# Patient Record
Sex: Male | Born: 1945
Health system: Southern US, Community
[De-identification: ages and names within clinical notes are randomized; demographics above are authoritative.]

## PROBLEM LIST (undated history)

## (undated) DIAGNOSIS — Z9289 Personal history of other medical treatment: Secondary | ICD-10-CM

## (undated) DIAGNOSIS — R7881 Bacteremia: Secondary | ICD-10-CM

## (undated) DIAGNOSIS — I4819 Other persistent atrial fibrillation: Secondary | ICD-10-CM

## (undated) DIAGNOSIS — I82403 Acute embolism and thrombosis of unspecified deep veins of lower extremity, bilateral: Secondary | ICD-10-CM

## (undated) DIAGNOSIS — G4733 Obstructive sleep apnea (adult) (pediatric): Secondary | ICD-10-CM

## (undated) DIAGNOSIS — Z9989 Dependence on other enabling machines and devices: Secondary | ICD-10-CM

## (undated) DIAGNOSIS — E669 Obesity, unspecified: Secondary | ICD-10-CM

## (undated) DIAGNOSIS — R6520 Severe sepsis without septic shock: Secondary | ICD-10-CM

## (undated) DIAGNOSIS — I10 Essential (primary) hypertension: Secondary | ICD-10-CM

## (undated) DIAGNOSIS — G4730 Sleep apnea, unspecified: Secondary | ICD-10-CM

## (undated) DIAGNOSIS — M311 Thrombotic microangiopathy: Secondary | ICD-10-CM

## (undated) DIAGNOSIS — A419 Sepsis, unspecified organism: Secondary | ICD-10-CM

## (undated) DIAGNOSIS — E785 Hyperlipidemia, unspecified: Secondary | ICD-10-CM

## (undated) HISTORY — PX: SKIN SURGERY: SHX2413

## (undated) HISTORY — DX: Bacteremia: R78.81

## (undated) HISTORY — DX: Obstructive sleep apnea (adult) (pediatric): G47.33

## (undated) HISTORY — DX: Personal history of other medical treatment: Z92.89

## (undated) HISTORY — DX: Thrombotic microangiopathy: M31.1

## (undated) HISTORY — DX: Dependence on other enabling machines and devices: Z99.89

## (undated) HISTORY — DX: Acute embolism and thrombosis of unspecified deep veins of lower extremity, bilateral: I82.403

## (undated) HISTORY — PX: JOINT REPLACEMENT: SHX530

## (undated) HISTORY — DX: Sepsis, unspecified organism: A41.9

## (undated) HISTORY — PX: TOTAL KNEE ARTHROPLASTY: SHX125

## (undated) HISTORY — PX: EYE SURGERY: SHX253

## (undated) HISTORY — DX: Sleep apnea, unspecified: G47.30

## (undated) HISTORY — DX: Severe sepsis without septic shock: R65.20

---

## 2003-10-11 ENCOUNTER — Ambulatory Visit (HOSPITAL_COMMUNITY): Admission: RE | Admit: 2003-10-11 | Discharge: 2003-10-11 | Payer: Self-pay | Admitting: Gastroenterology

## 2008-08-23 ENCOUNTER — Ambulatory Visit: Payer: Self-pay | Admitting: Orthopedic Surgery

## 2008-09-09 ENCOUNTER — Inpatient Hospital Stay: Payer: Self-pay | Admitting: Orthopedic Surgery

## 2009-03-04 ENCOUNTER — Ambulatory Visit: Payer: Self-pay | Admitting: Orthopedic Surgery

## 2009-03-17 ENCOUNTER — Inpatient Hospital Stay: Payer: Self-pay | Admitting: Orthopedic Surgery

## 2011-02-01 DIAGNOSIS — I10 Essential (primary) hypertension: Secondary | ICD-10-CM | POA: Diagnosis not present

## 2011-02-01 DIAGNOSIS — I4891 Unspecified atrial fibrillation: Secondary | ICD-10-CM | POA: Diagnosis not present

## 2011-02-07 DIAGNOSIS — I10 Essential (primary) hypertension: Secondary | ICD-10-CM | POA: Diagnosis not present

## 2011-02-07 DIAGNOSIS — Z9289 Personal history of other medical treatment: Secondary | ICD-10-CM

## 2011-02-07 DIAGNOSIS — R0989 Other specified symptoms and signs involving the circulatory and respiratory systems: Secondary | ICD-10-CM | POA: Diagnosis not present

## 2011-02-07 DIAGNOSIS — R0609 Other forms of dyspnea: Secondary | ICD-10-CM | POA: Diagnosis not present

## 2011-02-07 DIAGNOSIS — I4891 Unspecified atrial fibrillation: Secondary | ICD-10-CM | POA: Diagnosis not present

## 2011-02-07 HISTORY — PX: TRANSTHORACIC ECHOCARDIOGRAM: SHX275

## 2011-02-07 HISTORY — DX: Personal history of other medical treatment: Z92.89

## 2011-02-07 HISTORY — PX: NM MYOVIEW LTD: HXRAD82

## 2011-02-09 DIAGNOSIS — I4821 Permanent atrial fibrillation: Secondary | ICD-10-CM

## 2011-02-09 DIAGNOSIS — I4819 Other persistent atrial fibrillation: Secondary | ICD-10-CM

## 2011-02-09 HISTORY — DX: Permanent atrial fibrillation: I48.21

## 2011-02-09 HISTORY — DX: Other persistent atrial fibrillation: I48.19

## 2011-02-21 DIAGNOSIS — I4891 Unspecified atrial fibrillation: Secondary | ICD-10-CM | POA: Diagnosis not present

## 2011-02-21 DIAGNOSIS — R9430 Abnormal result of cardiovascular function study, unspecified: Secondary | ICD-10-CM | POA: Diagnosis not present

## 2011-02-21 DIAGNOSIS — I10 Essential (primary) hypertension: Secondary | ICD-10-CM | POA: Diagnosis not present

## 2011-03-07 DIAGNOSIS — R7301 Impaired fasting glucose: Secondary | ICD-10-CM | POA: Diagnosis not present

## 2011-03-07 DIAGNOSIS — I4891 Unspecified atrial fibrillation: Secondary | ICD-10-CM | POA: Diagnosis not present

## 2011-03-07 DIAGNOSIS — Z79899 Other long term (current) drug therapy: Secondary | ICD-10-CM | POA: Diagnosis not present

## 2011-05-30 DIAGNOSIS — I10 Essential (primary) hypertension: Secondary | ICD-10-CM | POA: Diagnosis not present

## 2011-05-30 DIAGNOSIS — I4891 Unspecified atrial fibrillation: Secondary | ICD-10-CM | POA: Diagnosis not present

## 2011-05-31 ENCOUNTER — Encounter (HOSPITAL_COMMUNITY): Payer: Self-pay | Admitting: Pharmacy Technician

## 2011-06-01 DIAGNOSIS — Z01818 Encounter for other preprocedural examination: Secondary | ICD-10-CM | POA: Diagnosis not present

## 2011-06-01 DIAGNOSIS — R5381 Other malaise: Secondary | ICD-10-CM | POA: Diagnosis not present

## 2011-06-01 DIAGNOSIS — D689 Coagulation defect, unspecified: Secondary | ICD-10-CM | POA: Diagnosis not present

## 2011-06-05 ENCOUNTER — Other Ambulatory Visit: Payer: Self-pay | Admitting: Cardiology

## 2011-06-06 ENCOUNTER — Encounter (HOSPITAL_COMMUNITY): Payer: Self-pay | Admitting: Cardiology

## 2011-06-07 MED ORDER — SODIUM CHLORIDE 0.9 % IV SOLN: INTRAVENOUS | Status: DC

## 2011-06-08 ENCOUNTER — Encounter (HOSPITAL_COMMUNITY): Payer: Self-pay | Admitting: Anesthesiology

## 2011-06-08 ENCOUNTER — Encounter (HOSPITAL_COMMUNITY): Payer: Self-pay | Admitting: *Deleted

## 2011-06-08 ENCOUNTER — Ambulatory Visit (HOSPITAL_COMMUNITY): Payer: Medicare Other | Admitting: Anesthesiology

## 2011-06-08 ENCOUNTER — Encounter (HOSPITAL_COMMUNITY)
Admission: RE | Disposition: A | Discharge status: Home / Self Care | Payer: Self-pay | Source: Ambulatory Visit | Attending: Cardiology

## 2011-06-08 ENCOUNTER — Ambulatory Visit (HOSPITAL_COMMUNITY)
Admission: RE | Admit: 2011-06-08 | Discharge: 2011-06-08 | Disposition: A | Discharge status: Home / Self Care | Payer: Medicare Other | Source: Ambulatory Visit | Attending: Cardiology | Admitting: Cardiology

## 2011-06-08 DIAGNOSIS — Z6830 Body mass index (BMI) 30.0-30.9, adult: Secondary | ICD-10-CM | POA: Insufficient documentation

## 2011-06-08 DIAGNOSIS — E669 Obesity, unspecified: Secondary | ICD-10-CM | POA: Diagnosis not present

## 2011-06-08 DIAGNOSIS — E785 Hyperlipidemia, unspecified: Secondary | ICD-10-CM | POA: Diagnosis not present

## 2011-06-08 DIAGNOSIS — Z9660 Presence of unspecified orthopedic joint implant: Secondary | ICD-10-CM | POA: Insufficient documentation

## 2011-06-08 DIAGNOSIS — I4891 Unspecified atrial fibrillation: Secondary | ICD-10-CM | POA: Diagnosis not present

## 2011-06-08 DIAGNOSIS — G4730 Sleep apnea, unspecified: Secondary | ICD-10-CM | POA: Insufficient documentation

## 2011-06-08 DIAGNOSIS — I10 Essential (primary) hypertension: Secondary | ICD-10-CM | POA: Diagnosis not present

## 2011-06-08 HISTORY — PX: CARDIOVERSION: SHX1299

## 2011-06-08 HISTORY — DX: Hyperlipidemia, unspecified: E78.5

## 2011-06-08 HISTORY — DX: Essential (primary) hypertension: I10

## 2011-06-08 HISTORY — DX: Other persistent atrial fibrillation: I48.19

## 2011-06-08 HISTORY — DX: Obesity, unspecified: E66.9

## 2011-06-08 SURGERY — CARDIOVERSION
Anesthesia: Monitor Anesthesia Care | Wound class: Clean

## 2011-06-08 MED ORDER — SODIUM CHLORIDE 0.9 % IV SOLN
INTRAVENOUS | Status: DC | PRN
  Administered 2011-06-08: 12:00:00 via INTRAVENOUS

## 2011-06-08 MED ORDER — PROPOFOL 10 MG/ML IV BOLUS
INTRAVENOUS | Status: DC | PRN
  Administered 2011-06-08: 100 mg via INTRAVENOUS

## 2011-06-08 NOTE — Anesthesia Preprocedure Evaluation (Addendum)
Anesthesia Evaluation  Patient identified by MRN, date of birth, ID band Patient awake    Reviewed: Allergy & Precautions, H&P , NPO status , Patient's Chart, lab work & pertinent test results, reviewed documented beta blocker date and time   History of Anesthesia Complications Negative for: history of anesthetic complications  Airway Mallampati: III TM Distance: >3 FB Neck ROM: Full    Dental  (+) Teeth Intact and Dental Advisory Given   Pulmonary sleep apnea and Continuous Positive Airway Pressure Ventilation ,  breath sounds clear to auscultation        Cardiovascular hypertension, Pt. on medications and Pt. on home beta blockers + dysrhythmias Atrial Fibrillation Rhythm:Irregular     Neuro/Psych    GI/Hepatic   Endo/Other    Renal/GU      Musculoskeletal   Abdominal   Peds  Hematology   Anesthesia Other Findings   Reproductive/Obstetrics                           Anesthesia Physical Anesthesia Plan  ASA: III  Anesthesia Plan: General   Post-op Pain Management:    Induction:   Airway Management Planned: Mask  Additional Equipment:   Intra-op Plan:   Post-operative Plan: Extubation in OR  Informed Consent: I have reviewed the patients History and Physical, chart, labs and discussed the procedure including the risks, benefits and alternatives for the proposed anesthesia with the patient or authorized representative who has indicated his/her understanding and acceptance.   Dental advisory given  Plan Discussed with: CRNA, Surgeon and Anesthesiologist  Anesthesia Plan Comments:         Anesthesia Quick Evaluation

## 2011-06-08 NOTE — CV Procedure (Signed)
[facility]  CARDIOVERSION NOTE   Procedure: Electrical Cardioversion Indications:  Atrial Fibrillation  Procedure Details:  Consent: Risks of procedure as well as the alternatives and risks of each were explained to the (patient/caregiver).  Consent for procedure obtained.  Time Out: Verified patient identification, verified procedure, site/side was marked, verified correct patient position, special equipment/implants available, medications/allergies/relevent history reviewed, required imaging and test results available.  Performed  Patient placed on cardiac monitor, pulse oximetry, supplemental oxygen as necessary.  Sedation given: Diprovan 100 mg Pacer pads placed anterior and posterior chest.  Cardioverted 1 time(s).  Cardioverted at 120J.  Evaluation: Findings: Post procedure EKG shows: NSR Complications: None Patient did tolerate procedure well.   Lennette Bihari, MD, [facility] 06/08/2011 12:46 PM

## 2011-06-08 NOTE — Discharge Instructions (Signed)
Electrical Cardioversion Cardioversion is the delivery of a jolt of electricity to change the rhythm of the heart. Sticky patches or metal paddles are placed on the chest to deliver the electricity from a special device. This is done to restore a normal rhythm. A rhythm that is too fast or not regular keeps the heart from pumping well. Compared to medicines used to change an abnormal rhythm, cardioversion is faster and works better. It is also unpleasant and may dislodge blood clots from the heart. WHEN WOULD THIS BE DONE?  In an emergency:   There is low or no blood pressure as a result of the heart rhythm.   Normal rhythm must be restored as fast as possible to protect the brain and heart from further damage.   It may save a life.   For less serious heart rhythms, such as atrial fibrillation or flutter, in which:   The heart is beating too fast or is not regular.   The heart is still able to pump enough blood, but not as well as it should.   Medicine to change the rhythm has not worked.   It is safe to wait in order to allow time for preparation.  LET YOUR CAREGIVER KNOW ABOUT:   Every medicine you are taking. It is very important to do this! Know when to take or stop taking any of them.   Any time in the past that you have felt your heart was not beating normally.  RISKS AND COMPLICATIONS   Clots may form in the chambers of the heart if it is beating too fast. These clots may be dislodged during the procedure and travel to other parts of the body.   There is risk of a stroke during and after the procedure if a clot moves. Blood thinners lower this risk.   You may have a special test of your heart (TEE) to make sure there are no clots in your heart.  BEFORE THE PROCEDURE   You may have some tests to see how well your heart is working.   You may start taking blood thinners so your blood does not clot as easily.   Other drugs may be given to help your heart work better.   PROCEDURE (SCHEDULED)  The procedure is typically done in a hospital by a heart doctor (cardiologist).   You will be told when and where to go.   You may be given some medicine through an intravenous (IV) access to reduce discomfort and make you sleepy before the procedure.   Your whole body may move when the shock is delivered. Your chest may feel sore.   You may be able to go home after a few hours. Your heart rhythm will be watched to make sure it does not change.  HOME CARE INSTRUCTIONS   Only take medicine as directed by your caregiver. Be sure you understand how and when to take your medicine.   Learn how to feel your pulse and check it often.   Limit your activity for 48 hours.   Avoid caffeine and other stimulants as directed.  SEEK MEDICAL CARE IF:   You feel like your heart is beating too fast or your pulse is not regular.   You have any questions about your medicines.   You have bleeding that will not stop.  SEEK IMMEDIATE MEDICAL CARE IF:   You are dizzy or feel faint.   It is hard to breathe or you feel short of breath.     There is a change in discomfort in your chest.   Your speech is slurred or you have trouble moving your arm or leg on one side.   You get a muscle cramp.   Your fingers or toes turn cold or blue.  MAKE SURE YOU:   Understand these instructions.   Will watch your condition.   Will get help right away if you are not doing well or get worse.  Document Released: 12/15/2001 Document Revised: 12/14/2010 Document Reviewed: 04/16/2007 Exit[employer] Patient Information 2012 [employer]. 

## 2011-06-08 NOTE — Anesthesia Postprocedure Evaluation (Signed)
  Anesthesia Post-op Note  Patient: Brett [name]  Procedure(s) Performed: Procedure(s) (LRB): CARDIOVERSION (N/A)  Patient Location: PACU and Short Stay  Anesthesia Type: MAC  Level of Consciousness: awake, alert  and oriented  Airway and Oxygen Therapy: Patient Spontanous Breathing and Patient connected to face mask oxygen  Post-op Pain: none  Post-op Assessment: Post-op Vital signs reviewed, Patient's Cardiovascular Status Stable, Respiratory Function Stable, Patent Airway, No signs of Nausea or vomiting and Pain level controlled  Post-op Vital Signs: Reviewed and stable  Complications: No apparent anesthesia complications

## 2011-06-08 NOTE — Preoperative (Signed)
Beta Blockers   Reason not to administer Beta Blockers:Not Applicable 

## 2011-06-08 NOTE — Transfer of Care (Signed)
Immediate Anesthesia Transfer of Care Note  Patient: Brett [name]  Procedure(s) Performed: Procedure(s) (LRB): CARDIOVERSION (N/A)  Patient Location: PACU and Short Stay  Anesthesia Type: MAC  Level of Consciousness: awake, alert  and oriented  Airway & Oxygen Therapy: Patient Spontanous Breathing and Patient connected to nasal cannula oxygen  Post-op Assessment: Report given to PACU RN  Post vital signs: Reviewed and stable  Complications: No apparent anesthesia complications

## 2011-06-08 NOTE — H&P (Signed)
History and Physical Interval Note:  NAME:  Brett [name]   MRN: [id number] DOB:  [birth year]   ADMIT DATE: 06/08/2011   06/08/2011 2:26 PM  Brett [name] is a 66 y.o. male with PMH below, who has been adequately anticoagulated on Pradaxa, and is on Multaq for persistent Afib.  He is still somwhat SOB & fatigued. He is referred for [facility].   Past Medical History  Diagnosis Date  . Persistent atrial fibrillation 02/2011    Negative Myoview; Normal EF by Echo  . Obesity (BMI 30-39.9)   . Hypertension   . Dyslipidemia    Past Surgical History  Procedure Date  . Joint replacement     FAMHx: History reviewed. No pertinent family history.  SOCHx:  reports that he has quit smoking. His smoking use included Cigars. He does not have any smokeless tobacco history on file. He reports that he drinks alcohol. He reports that he does not use illicit drugs.  ALLERGIES: No Known Allergies  HOME MEDICATIONS: Prescriptions prior to admission  Medication Sig Dispense Refill  . aspirin EC 81 MG tablet Take 81 mg by mouth daily.      . dabigatran (PRADAXA) 150 MG CAPS Take 150 mg by mouth every 12 (twelve) hours.      . dronedarone (MULTAQ) 400 MG tablet Take 400 mg by mouth 2 (two) times daily with a meal.      . fexofenadine (ALLEGRA) 180 MG tablet Take 180 mg by mouth daily.      Marland Kitchen lisinopril-hydrochlorothiazide (PRINZIDE,ZESTORETIC) 20-25 MG per tablet Take 0.5 tablets by mouth daily.      . metoprolol succinate (TOPROL-XL) 100 MG 24 hr tablet Take 100 mg by mouth daily. Take with or immediately following a meal.      . Multiple Vitamin (MULITIVITAMIN WITH MINERALS) TABS Take 1 tablet by mouth daily.      . niacin (NIASPAN) 500 MG CR tablet Take 500 mg by mouth at bedtime.        PHYSICAL EXAM:Blood pressure 125/85, pulse 66, temperature 98.1 F (36.7 C), temperature source Oral, resp. rate 22, height 5\' 11"  (1.803 m), weight 131.543 kg (290 lb), SpO2 99.00%.   IMPRESSION &  PLAN The patients' history has been reviewed, patient examined, no change in status from most recent note, stable for surgery. I have reviewed the patients' chart and labs. Questions were answered to the patient's satisfaction.    Brett [name] has presented today for surgery, with the diagnosis of Persistent Atrial Fibrillation. The various methods of treatment have been discussed with the patient and family.    After consideration of risks, benefits and other options for treatment, the patient has consented to Procedure(s):  Elective Synchronized Direct Current Cardioversion.  as a surgical intervention.   We will proceed with the planned procedure.  The procedure will be performed by Dr. [name], due to my involvement in an ongoing Cardiac Cath/PCI.   Mariel Lukins W [facility] [address] [place], [place]  [address]  [phone]  06/08/2011 2:26 PM

## 2011-06-11 ENCOUNTER — Encounter (HOSPITAL_COMMUNITY): Payer: Self-pay | Admitting: Cardiology

## 2011-06-28 DIAGNOSIS — I2510 Atherosclerotic heart disease of native coronary artery without angina pectoris: Secondary | ICD-10-CM | POA: Diagnosis not present

## 2011-06-28 DIAGNOSIS — E782 Mixed hyperlipidemia: Secondary | ICD-10-CM | POA: Diagnosis not present

## 2011-06-28 DIAGNOSIS — I10 Essential (primary) hypertension: Secondary | ICD-10-CM | POA: Diagnosis not present

## 2011-06-28 DIAGNOSIS — E039 Hypothyroidism, unspecified: Secondary | ICD-10-CM | POA: Diagnosis not present

## 2011-06-28 DIAGNOSIS — I472 Ventricular tachycardia: Secondary | ICD-10-CM | POA: Diagnosis not present

## 2011-06-28 DIAGNOSIS — G4733 Obstructive sleep apnea (adult) (pediatric): Secondary | ICD-10-CM | POA: Diagnosis not present

## 2011-06-28 DIAGNOSIS — R7301 Impaired fasting glucose: Secondary | ICD-10-CM | POA: Diagnosis not present

## 2011-06-28 DIAGNOSIS — Z79899 Other long term (current) drug therapy: Secondary | ICD-10-CM | POA: Diagnosis not present

## 2011-06-28 DIAGNOSIS — I4891 Unspecified atrial fibrillation: Secondary | ICD-10-CM | POA: Diagnosis not present

## 2011-08-26 DIAGNOSIS — I872 Venous insufficiency (chronic) (peripheral): Secondary | ICD-10-CM | POA: Insufficient documentation

## 2011-09-06 DIAGNOSIS — I10 Essential (primary) hypertension: Secondary | ICD-10-CM | POA: Diagnosis not present

## 2011-09-06 DIAGNOSIS — I4891 Unspecified atrial fibrillation: Secondary | ICD-10-CM | POA: Diagnosis not present

## 2011-09-13 DIAGNOSIS — H2510 Age-related nuclear cataract, unspecified eye: Secondary | ICD-10-CM | POA: Diagnosis not present

## 2011-09-13 DIAGNOSIS — H43819 Vitreous degeneration, unspecified eye: Secondary | ICD-10-CM | POA: Diagnosis not present

## 2011-10-15 DIAGNOSIS — Z8249 Family history of ischemic heart disease and other diseases of the circulatory system: Secondary | ICD-10-CM | POA: Diagnosis not present

## 2011-10-15 DIAGNOSIS — I4891 Unspecified atrial fibrillation: Secondary | ICD-10-CM | POA: Diagnosis not present

## 2011-10-15 DIAGNOSIS — Z79899 Other long term (current) drug therapy: Secondary | ICD-10-CM | POA: Diagnosis not present

## 2011-10-15 DIAGNOSIS — I10 Essential (primary) hypertension: Secondary | ICD-10-CM | POA: Diagnosis not present

## 2011-10-15 DIAGNOSIS — E785 Hyperlipidemia, unspecified: Secondary | ICD-10-CM | POA: Diagnosis not present

## 2012-01-21 DIAGNOSIS — R635 Abnormal weight gain: Secondary | ICD-10-CM | POA: Diagnosis not present

## 2012-01-21 DIAGNOSIS — I10 Essential (primary) hypertension: Secondary | ICD-10-CM | POA: Diagnosis not present

## 2012-01-21 DIAGNOSIS — I4891 Unspecified atrial fibrillation: Secondary | ICD-10-CM | POA: Diagnosis not present

## 2012-01-21 DIAGNOSIS — E785 Hyperlipidemia, unspecified: Secondary | ICD-10-CM | POA: Diagnosis not present

## 2012-02-11 DIAGNOSIS — R609 Edema, unspecified: Secondary | ICD-10-CM | POA: Diagnosis not present

## 2012-02-11 DIAGNOSIS — I10 Essential (primary) hypertension: Secondary | ICD-10-CM | POA: Diagnosis not present

## 2012-02-11 DIAGNOSIS — I4891 Unspecified atrial fibrillation: Secondary | ICD-10-CM | POA: Diagnosis not present

## 2012-02-12 ENCOUNTER — Other Ambulatory Visit (HOSPITAL_COMMUNITY): Payer: Self-pay | Admitting: Cardiology

## 2012-02-12 DIAGNOSIS — I10 Essential (primary) hypertension: Secondary | ICD-10-CM

## 2012-02-12 DIAGNOSIS — I872 Venous insufficiency (chronic) (peripheral): Secondary | ICD-10-CM

## 2012-02-12 DIAGNOSIS — R609 Edema, unspecified: Secondary | ICD-10-CM

## 2012-03-05 ENCOUNTER — Ambulatory Visit (HOSPITAL_COMMUNITY)
Admission: RE | Admit: 2012-03-05 | Discharge: 2012-03-05 | Disposition: A | Discharge status: Home / Self Care | Payer: Medicare Other | Source: Ambulatory Visit | Attending: Cardiology | Admitting: Cardiology

## 2012-03-05 DIAGNOSIS — I10 Essential (primary) hypertension: Secondary | ICD-10-CM | POA: Diagnosis not present

## 2012-03-05 DIAGNOSIS — I872 Venous insufficiency (chronic) (peripheral): Secondary | ICD-10-CM

## 2012-03-05 DIAGNOSIS — R609 Edema, unspecified: Secondary | ICD-10-CM

## 2012-03-05 DIAGNOSIS — M7989 Other specified soft tissue disorders: Secondary | ICD-10-CM | POA: Diagnosis not present

## 2012-03-05 NOTE — Progress Notes (Signed)
Venous Duplex Completed.  Kadon Andrus D  

## 2012-04-03 DIAGNOSIS — G4730 Sleep apnea, unspecified: Secondary | ICD-10-CM | POA: Diagnosis not present

## 2012-04-03 DIAGNOSIS — J3089 Other allergic rhinitis: Secondary | ICD-10-CM | POA: Diagnosis not present

## 2012-04-03 DIAGNOSIS — R609 Edema, unspecified: Secondary | ICD-10-CM | POA: Diagnosis not present

## 2012-04-03 DIAGNOSIS — E039 Hypothyroidism, unspecified: Secondary | ICD-10-CM | POA: Diagnosis not present

## 2012-04-03 DIAGNOSIS — Z125 Encounter for screening for malignant neoplasm of prostate: Secondary | ICD-10-CM | POA: Diagnosis not present

## 2012-04-03 DIAGNOSIS — I10 Essential (primary) hypertension: Secondary | ICD-10-CM | POA: Diagnosis not present

## 2012-04-03 DIAGNOSIS — L02419 Cutaneous abscess of limb, unspecified: Secondary | ICD-10-CM | POA: Diagnosis not present

## 2012-04-03 DIAGNOSIS — R21 Rash and other nonspecific skin eruption: Secondary | ICD-10-CM | POA: Diagnosis not present

## 2012-04-03 DIAGNOSIS — Z79899 Other long term (current) drug therapy: Secondary | ICD-10-CM | POA: Diagnosis not present

## 2012-04-03 DIAGNOSIS — R7301 Impaired fasting glucose: Secondary | ICD-10-CM | POA: Diagnosis not present

## 2012-04-03 DIAGNOSIS — E559 Vitamin D deficiency, unspecified: Secondary | ICD-10-CM | POA: Diagnosis not present

## 2012-05-20 DIAGNOSIS — I8310 Varicose veins of unspecified lower extremity with inflammation: Secondary | ICD-10-CM | POA: Diagnosis not present

## 2012-05-20 DIAGNOSIS — L080 Pyoderma: Secondary | ICD-10-CM | POA: Diagnosis not present

## 2012-06-06 ENCOUNTER — Encounter (HOSPITAL_COMMUNITY): Payer: Self-pay | Admitting: Emergency Medicine

## 2012-06-06 ENCOUNTER — Inpatient Hospital Stay (HOSPITAL_COMMUNITY): Payer: Medicare Other

## 2012-06-06 ENCOUNTER — Other Ambulatory Visit: Payer: Self-pay

## 2012-06-06 ENCOUNTER — Inpatient Hospital Stay (HOSPITAL_COMMUNITY)
Admission: EM | Admit: 2012-06-06 | Discharge: 2012-06-19 | DRG: 870 | Disposition: A | Discharge status: Other Acute Inpatient Hospital | Payer: Medicare Other | Attending: Family Medicine | Admitting: Family Medicine

## 2012-06-06 DIAGNOSIS — I4891 Unspecified atrial fibrillation: Secondary | ICD-10-CM | POA: Diagnosis present

## 2012-06-06 DIAGNOSIS — M311 Thrombotic microangiopathy, unspecified: Secondary | ICD-10-CM | POA: Diagnosis present

## 2012-06-06 DIAGNOSIS — I369 Nonrheumatic tricuspid valve disorder, unspecified: Secondary | ICD-10-CM

## 2012-06-06 DIAGNOSIS — Z418 Encounter for other procedures for purposes other than remedying health state: Secondary | ICD-10-CM

## 2012-06-06 DIAGNOSIS — I824Y9 Acute embolism and thrombosis of unspecified deep veins of unspecified proximal lower extremity: Secondary | ICD-10-CM | POA: Diagnosis not present

## 2012-06-06 DIAGNOSIS — I339 Acute and subacute endocarditis, unspecified: Secondary | ICD-10-CM | POA: Diagnosis not present

## 2012-06-06 DIAGNOSIS — K5289 Other specified noninfective gastroenteritis and colitis: Secondary | ICD-10-CM | POA: Diagnosis present

## 2012-06-06 DIAGNOSIS — A4901 Methicillin susceptible Staphylococcus aureus infection, unspecified site: Secondary | ICD-10-CM | POA: Diagnosis not present

## 2012-06-06 DIAGNOSIS — J90 Pleural effusion, not elsewhere classified: Secondary | ICD-10-CM | POA: Diagnosis not present

## 2012-06-06 DIAGNOSIS — E785 Hyperlipidemia, unspecified: Secondary | ICD-10-CM | POA: Diagnosis present

## 2012-06-06 DIAGNOSIS — D62 Acute posthemorrhagic anemia: Secondary | ICD-10-CM | POA: Diagnosis present

## 2012-06-06 DIAGNOSIS — Z6839 Body mass index (BMI) 39.0-39.9, adult: Secondary | ICD-10-CM | POA: Diagnosis not present

## 2012-06-06 DIAGNOSIS — A4101 Sepsis due to Methicillin susceptible Staphylococcus aureus: Secondary | ICD-10-CM

## 2012-06-06 DIAGNOSIS — L02419 Cutaneous abscess of limb, unspecified: Secondary | ICD-10-CM | POA: Diagnosis present

## 2012-06-06 DIAGNOSIS — G4733 Obstructive sleep apnea (adult) (pediatric): Secondary | ICD-10-CM | POA: Diagnosis not present

## 2012-06-06 DIAGNOSIS — R609 Edema, unspecified: Secondary | ICD-10-CM | POA: Diagnosis present

## 2012-06-06 DIAGNOSIS — D698 Other specified hemorrhagic conditions: Secondary | ICD-10-CM | POA: Diagnosis present

## 2012-06-06 DIAGNOSIS — J9600 Acute respiratory failure, unspecified whether with hypoxia or hypercapnia: Secondary | ICD-10-CM | POA: Diagnosis present

## 2012-06-06 DIAGNOSIS — R0989 Other specified symptoms and signs involving the circulatory and respiratory systems: Secondary | ICD-10-CM | POA: Diagnosis not present

## 2012-06-06 DIAGNOSIS — M3119 Other thrombotic microangiopathy: Secondary | ICD-10-CM | POA: Diagnosis present

## 2012-06-06 DIAGNOSIS — Z87891 Personal history of nicotine dependence: Secondary | ICD-10-CM

## 2012-06-06 DIAGNOSIS — I4821 Permanent atrial fibrillation: Secondary | ICD-10-CM | POA: Diagnosis present

## 2012-06-06 DIAGNOSIS — R7881 Bacteremia: Secondary | ICD-10-CM

## 2012-06-06 DIAGNOSIS — D593 Hemolytic-uremic syndrome, unspecified: Secondary | ICD-10-CM | POA: Diagnosis present

## 2012-06-06 DIAGNOSIS — E876 Hypokalemia: Secondary | ICD-10-CM | POA: Diagnosis not present

## 2012-06-06 DIAGNOSIS — J986 Disorders of diaphragm: Secondary | ICD-10-CM | POA: Diagnosis not present

## 2012-06-06 DIAGNOSIS — J9819 Other pulmonary collapse: Secondary | ICD-10-CM | POA: Diagnosis not present

## 2012-06-06 DIAGNOSIS — G9341 Metabolic encephalopathy: Secondary | ICD-10-CM | POA: Diagnosis present

## 2012-06-06 DIAGNOSIS — E872 Acidosis, unspecified: Secondary | ICD-10-CM | POA: Diagnosis not present

## 2012-06-06 DIAGNOSIS — I469 Cardiac arrest, cause unspecified: Secondary | ICD-10-CM

## 2012-06-06 DIAGNOSIS — D696 Thrombocytopenia, unspecified: Secondary | ICD-10-CM | POA: Diagnosis present

## 2012-06-06 DIAGNOSIS — E870 Hyperosmolality and hypernatremia: Secondary | ICD-10-CM | POA: Diagnosis not present

## 2012-06-06 DIAGNOSIS — R5381 Other malaise: Secondary | ICD-10-CM | POA: Diagnosis not present

## 2012-06-06 DIAGNOSIS — Z452 Encounter for adjustment and management of vascular access device: Secondary | ICD-10-CM | POA: Diagnosis not present

## 2012-06-06 DIAGNOSIS — I4819 Other persistent atrial fibrillation: Secondary | ICD-10-CM | POA: Diagnosis present

## 2012-06-06 DIAGNOSIS — R404 Transient alteration of awareness: Secondary | ICD-10-CM | POA: Diagnosis not present

## 2012-06-06 DIAGNOSIS — I10 Essential (primary) hypertension: Secondary | ICD-10-CM | POA: Diagnosis present

## 2012-06-06 DIAGNOSIS — Z4682 Encounter for fitting and adjustment of non-vascular catheter: Secondary | ICD-10-CM | POA: Diagnosis not present

## 2012-06-06 DIAGNOSIS — Z2989 Encounter for other specified prophylactic measures: Secondary | ICD-10-CM

## 2012-06-06 DIAGNOSIS — A419 Sepsis, unspecified organism: Secondary | ICD-10-CM | POA: Diagnosis not present

## 2012-06-06 DIAGNOSIS — T50995A Adverse effect of other drugs, medicaments and biological substances, initial encounter: Secondary | ICD-10-CM | POA: Diagnosis present

## 2012-06-06 DIAGNOSIS — A4102 Sepsis due to Methicillin resistant Staphylococcus aureus: Secondary | ICD-10-CM | POA: Diagnosis not present

## 2012-06-06 DIAGNOSIS — Z6841 Body Mass Index (BMI) 40.0 and over, adult: Secondary | ICD-10-CM

## 2012-06-06 DIAGNOSIS — R739 Hyperglycemia, unspecified: Secondary | ICD-10-CM | POA: Diagnosis present

## 2012-06-06 DIAGNOSIS — R7309 Other abnormal glucose: Secondary | ICD-10-CM | POA: Diagnosis not present

## 2012-06-06 DIAGNOSIS — N179 Acute kidney failure, unspecified: Secondary | ICD-10-CM | POA: Diagnosis not present

## 2012-06-06 DIAGNOSIS — R6521 Severe sepsis with septic shock: Secondary | ICD-10-CM | POA: Diagnosis present

## 2012-06-06 DIAGNOSIS — R4182 Altered mental status, unspecified: Secondary | ICD-10-CM | POA: Diagnosis not present

## 2012-06-06 DIAGNOSIS — E669 Obesity, unspecified: Secondary | ICD-10-CM | POA: Diagnosis present

## 2012-06-06 DIAGNOSIS — E119 Type 2 diabetes mellitus without complications: Secondary | ICD-10-CM

## 2012-06-06 DIAGNOSIS — B957 Other staphylococcus as the cause of diseases classified elsewhere: Secondary | ICD-10-CM | POA: Diagnosis present

## 2012-06-06 DIAGNOSIS — Z7901 Long term (current) use of anticoagulants: Secondary | ICD-10-CM

## 2012-06-06 DIAGNOSIS — E441 Mild protein-calorie malnutrition: Secondary | ICD-10-CM | POA: Diagnosis present

## 2012-06-06 DIAGNOSIS — I872 Venous insufficiency (chronic) (peripheral): Secondary | ICD-10-CM | POA: Diagnosis present

## 2012-06-06 DIAGNOSIS — I82403 Acute embolism and thrombosis of unspecified deep veins of lower extremity, bilateral: Secondary | ICD-10-CM

## 2012-06-06 DIAGNOSIS — L03119 Cellulitis of unspecified part of limb: Secondary | ICD-10-CM | POA: Diagnosis present

## 2012-06-06 DIAGNOSIS — K55059 Acute (reversible) ischemia of intestine, part and extent unspecified: Secondary | ICD-10-CM | POA: Diagnosis not present

## 2012-06-06 DIAGNOSIS — F29 Unspecified psychosis not due to a substance or known physiological condition: Secondary | ICD-10-CM | POA: Diagnosis not present

## 2012-06-06 DIAGNOSIS — R918 Other nonspecific abnormal finding of lung field: Secondary | ICD-10-CM | POA: Diagnosis not present

## 2012-06-06 DIAGNOSIS — R197 Diarrhea, unspecified: Secondary | ICD-10-CM | POA: Diagnosis present

## 2012-06-06 DIAGNOSIS — A4902 Methicillin resistant Staphylococcus aureus infection, unspecified site: Secondary | ICD-10-CM | POA: Diagnosis not present

## 2012-06-06 DIAGNOSIS — R578 Other shock: Secondary | ICD-10-CM | POA: Diagnosis not present

## 2012-06-06 DIAGNOSIS — Z96659 Presence of unspecified artificial knee joint: Secondary | ICD-10-CM

## 2012-06-06 DIAGNOSIS — R161 Splenomegaly, not elsewhere classified: Secondary | ICD-10-CM | POA: Diagnosis not present

## 2012-06-06 DIAGNOSIS — K55039 Acute (reversible) ischemia of large intestine, extent unspecified: Secondary | ICD-10-CM | POA: Diagnosis present

## 2012-06-06 DIAGNOSIS — I517 Cardiomegaly: Secondary | ICD-10-CM | POA: Diagnosis not present

## 2012-06-06 DIAGNOSIS — T8089XA Other complications following infusion, transfusion and therapeutic injection, initial encounter: Secondary | ICD-10-CM | POA: Diagnosis not present

## 2012-06-06 DIAGNOSIS — D699 Hemorrhagic condition, unspecified: Secondary | ICD-10-CM | POA: Diagnosis present

## 2012-06-06 LAB — COMPREHENSIVE METABOLIC PANEL
ALT: 93 U/L — ABNORMAL HIGH (ref 0–53)
AST: 178 U/L — ABNORMAL HIGH (ref 0–37)
BUN: 117 mg/dL — ABNORMAL HIGH (ref 6–23)
CO2: 13 mEq/L — ABNORMAL LOW (ref 19–32)
CO2: 10 mEq/L — CL (ref 19–32)
Calcium: 6.6 mg/dL — ABNORMAL LOW (ref 8.4–10.5)
Chloride: 88 mEq/L — ABNORMAL LOW (ref 96–112)
Chloride: 98 mEq/L (ref 96–112)
Creatinine, Ser: 11.11 mg/dL — ABNORMAL HIGH (ref 0.50–1.35)
Creatinine, Ser: 10.03 mg/dL — ABNORMAL HIGH (ref 0.50–1.35)
GFR calc Af Amer: 5 mL/min — ABNORMAL LOW (ref >90)
GFR calc non Af Amer: 4 mL/min — ABNORMAL LOW (ref >90)
GFR calc non Af Amer: 5 mL/min — ABNORMAL LOW (ref >90)
Sodium: 125 mEq/L — ABNORMAL LOW (ref 135–145)
Total Bilirubin: 0.9 mg/dL (ref 0.3–1.2)
Total Bilirubin: 0.7 mg/dL (ref 0.3–1.2)

## 2012-06-06 LAB — URINALYSIS, ROUTINE W REFLEX MICROSCOPIC
Glucose, UA: 100 mg/dL — AB
Glucose, UA: 100 mg/dL — AB
Ketones, ur: NEGATIVE mg/dL
Protein, ur: >300 mg/dL — AB
Protein, ur: >300 mg/dL — AB
Specific Gravity, Urine: 1.02 (ref 1.005–1.030)
Urobilinogen, UA: 0.2 mg/dL (ref 0.0–1.0)

## 2012-06-06 LAB — URINE MICROSCOPIC-ADD ON

## 2012-06-06 LAB — LACTIC ACID, PLASMA: Lactic Acid, Venous: 0.9 mmol/L (ref 0.5–2.2)

## 2012-06-06 LAB — CBC
HCT: 26.5 % — ABNORMAL LOW (ref 39.0–52.0)
MCH: 26.4 pg (ref 26.0–34.0)
MCV: 74.6 fL — ABNORMAL LOW (ref 78.0–100.0)
MCV: 74.4 fL — ABNORMAL LOW (ref 78.0–100.0)
Platelets: 51 10*3/uL — ABNORMAL LOW (ref 150–400)
RBC: 4.68 MIL/uL (ref 4.22–5.81)
RBC: 3.56 MIL/uL — ABNORMAL LOW (ref 4.22–5.81)
WBC: 4.7 10*3/uL (ref 4.0–10.5)
WBC: 3.6 10*3/uL — ABNORMAL LOW (ref 4.0–10.5)

## 2012-06-06 LAB — APTT: aPTT: 85 seconds — ABNORMAL HIGH (ref 24–37)

## 2012-06-06 LAB — POCT I-STAT 3, ART BLOOD GAS (G3+)
O2 Saturation: 98 %
Patient temperature: 100.1
TCO2: 11 mmol/L (ref 0–100)

## 2012-06-06 LAB — PROCALCITONIN: Procalcitonin: 9.19 ng/mL

## 2012-06-06 LAB — CARBOXYHEMOGLOBIN
Carboxyhemoglobin: 1.1 % (ref 0.5–1.5)
Methemoglobin: 0.9 % (ref 0.0–1.5)
O2 Saturation: 64.7 %
Total hemoglobin: 12.7 g/dL — ABNORMAL LOW (ref 13.5–18.0)

## 2012-06-06 LAB — CG4 I-STAT (LACTIC ACID): Lactic Acid, Venous: 1.9 mmol/L (ref 0.5–2.2)

## 2012-06-06 LAB — GLUCOSE, CAPILLARY
Glucose-Capillary: 105 mg/dL — ABNORMAL HIGH (ref 70–99)
Glucose-Capillary: 115 mg/dL — ABNORMAL HIGH (ref 70–99)

## 2012-06-06 LAB — ABO/RH: ABO/RH(D): O POS

## 2012-06-06 LAB — PROTIME-INR
INR: 4.99 — ABNORMAL HIGH (ref 0.00–1.49)
Prothrombin Time: 43.1 seconds — ABNORMAL HIGH (ref 11.6–15.2)

## 2012-06-06 LAB — TROPONIN I: Troponin I: <0.3 ng/mL (ref <0.30)

## 2012-06-06 MED ORDER — SODIUM CHLORIDE 0.9 % IV BOLUS (SEPSIS): 1000.0000 mL | INTRAVENOUS | Status: DC | PRN

## 2012-06-06 MED ORDER — HYDROCORTISONE SOD SUCCINATE 100 MG IJ SOLR
50.0000 mg | Freq: Four times a day (QID) | INTRAMUSCULAR | Status: DC
  Administered 2012-06-06 – 2012-06-11 (×21): 50 mg via INTRAVENOUS
  Filled 2012-06-06 (×24): qty 1

## 2012-06-06 MED ORDER — SODIUM CHLORIDE 0.9 % IV SOLN
0.0300 [IU]/min | INTRAVENOUS | Status: DC
  Filled 2012-06-06: qty 2.5

## 2012-06-06 MED ORDER — SODIUM CHLORIDE 0.9 % IV SOLN
1000.0000 mL | INTRAVENOUS | Status: DC
  Administered 2012-06-06: 1000 mL via INTRAVENOUS
  Administered 2012-06-14: 250 mL via INTRAVENOUS

## 2012-06-06 MED ORDER — PIPERACILLIN-TAZOBACTAM IN DEX 2-0.25 GM/50ML IV SOLN
2.2500 g | Freq: Four times a day (QID) | INTRAVENOUS | Status: DC
  Administered 2012-06-06 – 2012-06-09 (×11): 2.25 g via INTRAVENOUS
  Filled 2012-06-06 (×13): qty 50

## 2012-06-06 MED ORDER — "THROMBI-PAD 3""X3"" EX PADS"
1.0000 | MEDICATED_PAD | Freq: Once | CUTANEOUS | Status: AC
  Administered 2012-06-06: 1 via TOPICAL
  Filled 2012-06-06: qty 1

## 2012-06-06 MED ORDER — DOBUTAMINE IN D5W 4-5 MG/ML-% IV SOLN
2.5000 ug/kg/min | INTRAVENOUS | Status: DC
  Filled 2012-06-06: qty 250

## 2012-06-06 MED ORDER — NOREPINEPHRINE BITARTRATE 1 MG/ML IJ SOLN
2.0000 ug/min | Freq: Once | INTRAVENOUS | Status: DC
  Filled 2012-06-06: qty 4

## 2012-06-06 MED ORDER — VANCOMYCIN HCL 10 G IV SOLR
2000.0000 mg | INTRAVENOUS | Status: AC
  Administered 2012-06-06: 2000 mg via INTRAVENOUS
  Filled 2012-06-06: qty 2000

## 2012-06-06 MED ORDER — SODIUM CHLORIDE 0.9 % IV SOLN
1000.0000 mL | Freq: Once | INTRAVENOUS | Status: AC
  Administered 2012-06-06: 1000 mL via INTRAVENOUS

## 2012-06-06 MED ORDER — PIPERACILLIN-TAZOBACTAM 3.375 G IVPB
3.3750 g | Freq: Once | INTRAVENOUS | Status: AC
  Administered 2012-06-06: 3.375 g via INTRAVENOUS
  Filled 2012-06-06: qty 50

## 2012-06-06 MED ORDER — SODIUM CHLORIDE 0.9 % IV BOLUS (SEPSIS): 1000.0000 mL | Freq: Once | INTRAVENOUS | Status: AC

## 2012-06-06 MED ORDER — NOREPINEPHRINE BITARTRATE 1 MG/ML IJ SOLN
5.0000 ug/min | INTRAVENOUS | Status: DC
  Filled 2012-06-06 (×2): qty 4

## 2012-06-06 MED ORDER — ANTIINHIBITOR COAGULANT CMPLX IV SOLR
1477.0000 [IU] | Freq: Once | INTRAVENOUS | Status: AC
  Administered 2012-06-06: 1477 [IU] via INTRAVENOUS
  Filled 2012-06-06: qty 1477

## 2012-06-06 MED ORDER — SODIUM BICARBONATE 8.4 % IV SOLN
INTRAVENOUS | Status: DC
  Administered 2012-06-06 – 2012-06-07 (×4): via INTRAVENOUS
  Filled 2012-06-06 (×10): qty 150

## 2012-06-06 MED ORDER — ANTIINHIBITOR COAGULANT CMPLX IV SOLR
3062.0000 [IU] | Freq: Once | INTRAVENOUS | Status: DC
  Filled 2012-06-06: qty 3062

## 2012-06-06 MED ORDER — SODIUM CHLORIDE 0.9 % IV SOLN
1000.0000 mL | INTRAVENOUS | Status: DC
  Administered 2012-06-06: 1000 mL via INTRAVENOUS

## 2012-06-06 MED ORDER — SODIUM CHLORIDE 0.9 % IV SOLN
20.0000 ug | Freq: Once | INTRAVENOUS | Status: AC
  Administered 2012-06-06: 20 ug via INTRAVENOUS
  Filled 2012-06-06 (×2): qty 5

## 2012-06-06 MED ORDER — ANTIINHIBITOR COAGULANT CMPLX IV SOLR
1605.0000 [IU] | Freq: Once | INTRAVENOUS | Status: AC
  Administered 2012-06-07: 1605 [IU] via INTRAVENOUS
  Filled 2012-06-06: qty 1605

## 2012-06-06 MED ORDER — VANCOMYCIN HCL IN DEXTROSE 1-5 GM/200ML-% IV SOLN: 1000.0000 mg | Freq: Once | INTRAVENOUS | Status: DC

## 2012-06-06 MED ORDER — METOPROLOL TARTRATE 1 MG/ML IV SOLN
2.5000 mg | INTRAVENOUS | Status: DC | PRN
  Administered 2012-06-06 – 2012-06-11 (×10): 5 mg via INTRAVENOUS
  Filled 2012-06-06 (×10): qty 5

## 2012-06-06 MED ORDER — INSULIN ASPART 100 UNIT/ML ~~LOC~~ SOLN
2.0000 [IU] | SUBCUTANEOUS | Status: DC
  Administered 2012-06-07 (×2): 4 [IU] via SUBCUTANEOUS
  Administered 2012-06-07 (×2): 2 [IU] via SUBCUTANEOUS
  Administered 2012-06-07 – 2012-06-08 (×2): 4 [IU] via SUBCUTANEOUS
  Administered 2012-06-08 (×2): 6 [IU] via SUBCUTANEOUS

## 2012-06-06 MED ORDER — SODIUM CHLORIDE 0.9 % IV BOLUS (SEPSIS)
1000.0000 mL | Freq: Once | INTRAVENOUS | Status: AC
  Administered 2012-06-06: 1000 mL via INTRAVENOUS

## 2012-06-06 MED ORDER — PIPERACILLIN-TAZOBACTAM 3.375 G IVPB 30 MIN: 3.3750 g | Freq: Once | INTRAVENOUS | Status: DC

## 2012-06-06 MED ORDER — ACETAMINOPHEN 650 MG RE SUPP
650.0000 mg | RECTAL | Status: DC | PRN
  Administered 2012-06-07: 650 mg via RECTAL
  Filled 2012-06-06: qty 1

## 2012-06-06 MED ORDER — ACETAMINOPHEN 325 MG PO TABS
650.0000 mg | ORAL_TABLET | Freq: Once | ORAL | Status: AC
  Administered 2012-06-06: 650 mg via ORAL
  Filled 2012-06-06: qty 2

## 2012-06-06 MED ORDER — VANCOMYCIN HCL 10 G IV SOLR: 1750.0000 mg | INTRAVENOUS | Status: DC

## 2012-06-06 NOTE — Progress Notes (Signed)
ANTIBIOTIC CONSULT NOTE - INITIAL  Pharmacy Consult for Vancomycin, Zosyn Indication: rule out sepsis  No Known Allergies  Patient Measurements: Height: 6\' 1"  (185.4 cm) Weight: 290 lb (131.543 kg) IBW/kg (Calculated) : 79.9  Vital Signs: Temp: 102.6 F (39.2 C) (05/30 1122) Temp src: Rectal (05/30 1122) BP: 80/39 mmHg (05/30 1148) Pulse Rate: 148 (05/30 1058) Intake/Output from previous day:   Intake/Output from this shift:    Labs:  Recent Labs  06/06/12 1122  WBC 4.7  HGB 12.4*  PLT 51*  CREATININE 11.11*   Estimated Creatinine Clearance: 9.2 ml/min (by C-G formula based on Cr of 11.11). No results found for this basename: VANCOTROUGH, VANCOPEAK, VANCORANDOM, GENTTROUGH, GENTPEAK, GENTRANDOM, TOBRATROUGH, TOBRAPEAK, TOBRARND, AMIKACINPEAK, AMIKACINTROU, AMIKACIN,  in the last 72 hours   Microbiology: No results found for this or any previous visit (from the past 720 hour(s)).  Medical History: Past Medical History  Diagnosis Date  . Persistent atrial fibrillation 02/2011    Negative Myoview; Normal EF by Echo  . Obesity (BMI 30-39.9)   . Hypertension   . Dyslipidemia     Medications:  Prescriptions prior to admission  Medication Sig Dispense Refill  . aspirin EC 81 MG tablet Take 81 mg by mouth daily.      . dabigatran (PRADAXA) 150 MG CAPS Take 150 mg by mouth every 12 (twelve) hours.      . dronedarone (MULTAQ) 400 MG tablet Take 400 mg by mouth 2 (two) times daily with a meal.      . fexofenadine (ALLEGRA) 180 MG tablet Take 180 mg by mouth daily.      Marland Kitchen lisinopril-hydrochlorothiazide (PRINZIDE,ZESTORETIC) 20-25 MG per tablet Take 0.5 tablets by mouth daily.      . metoprolol succinate (TOPROL-XL) 100 MG 24 hr tablet Take 100 mg by mouth daily. Take with or immediately following a meal.      . Multiple Vitamin (MULITIVITAMIN WITH MINERALS) TABS Take 1 tablet by mouth daily.      . niacin (NIASPAN) 500 MG CR tablet Take 500 mg by mouth at bedtime.        Assessment: Mr.Mceachern is admitted with n/v/d and 3-4 day long hx of generalized weakness. No melena/hematochezia reported. Noted patient is in ARF, CrlCl <10. No historical Scr available, but patient does not have hx of CRI. Noted patient was febrile on admit, WBC nml.  CXR showed low lung volumes and mild bibasilar atelectasis, this in addition to SIRS criteria in the setting of hypotension requiring pressors. Patient received first dose of Zosyn 3.375g at noon, Vancomycin 2g at 1230. Sepsis alert fired ~1240.   Additionally, noted patient was on Pradaxa for Afib PTA, last dose taken at 2000 on 06/05/12, confirmed with patient. CHADS2=1. Anticipate this will not be continued in setting of ARF, since there Pradaxa is contraindicated in severe renal impairment (CrCl<30).  Vancomycin 5/30>>  Zosyn 5/30>>   5/30: Blood:  5/30: Urine:  5/30: Sputum:   Goal of Therapy:  Vancomycin trough level 15-20 mcg/ml  Plan:  - Vancomycin 1750mg  IV q 48h, next dose due on 6/1 @ 1200 - Zosyn 2.25gm IV q 6h, next due at 1800 today - Will f/up to assess Css Vanc trough when appropriate - Will monitor cx/spec/sens, renal fn/RRT and clinical status daily. - Will f/up for anticoagulation plans in setting of Afib and ARF.  Thanks, Laneta Guerin K. Allena Katz, PharmD, BCPS.  Clinical Pharmacist Pager [phone]. 06/06/2012 1:46 PM

## 2012-06-06 NOTE — Progress Notes (Signed)
eLink Physician-Brief Progress Note Patient Name: Brett [name] DOB: [birth year] MRN: [id number]  Date of Service  06/06/2012   HPI/Events of Note   Bleeding from CVL site Low plts, AKI & pradaxa  eICU Interventions  Thrombin pad, ddavp, 4u FFp   Intervention Category Intermediate Interventions: Bleeding - evaluation and treatment with blood products  Rainer Mounce V. 06/06/2012, 5:15 PM

## 2012-06-06 NOTE — Consult Note (Signed)
[name]        RENAL CONSULT   Reason for Consult: Metabolic acidosis, acute renal failure, and hypotension in patient with severe diarrhea Referring Physician: Dr. Jefm Miles  HPI: Brett [name] is a 67 y.o. white man admitted today with 4 days of diarrhea, metabolic acidosis, and creatinine 11. His primary care physician is Dr. Andi Devon. She reports CR was 1.01 on 03/31/2012. He was on hydrochlorothiazide and Prinivil prior to admission to Brett [name] says he has had diarrhea since 26 May but it has slowed down (2 loose BMs yesterday). He is currently hypotensive, acidotic, and oliguric. Renal consult was requested by Dr. Molli Knock .    Past Medical History  Diagnosis Date  . Persistent atrial fibrillation 02/2011    Negative Myoview; Normal EF by Echo. Cardioversion by Dr. Nicki Guadalajara May 2013   . Obesity (BMI 30-39.9)   . Hypertension   . Dyslipidemia Bilateral total knee replacements (done in [place])     Family history--father died age 46 of?, Mother died age 6 of CHF.  2 brothers, 2 sisters-healthy. One daughter age 63 lives in [place]  Social History: Born and grew up in [place], [place], graduated from [employer]. He attended [employer] college for 2 years. 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 Lives by himself.  He has been married twice and divorced twice. He doesn't smoke cigarettes he stopped drinking alcohol 20 years ago. He has a well for drinking water  Allergies: No Known Allergies  Medications:  I have reviewed the patient's current medications. Scheduled: . hydrocortisone sodium succinate  50 mg Intravenous Q6H  . insulin aspart  2-6 Units Subcutaneous Q4H  . norepinephrine (LEVOPHED) Adult infusion  2-50 mcg/min Intravenous Once  . piperacillin-tazobactam (ZOSYN)  IV  2.25 g Intravenous Q6H  . sodium chloride  1,000 mL Intravenous Once  . [START ON  06/08/2012] vancomycin  1,750 mg Intravenous Q48H   Continuous: . sodium chloride 1,000 mL (06/06/12 1617)  . norepinephrine (LEVOPHED) Adult infusion    .  sodium bicarbonate  infusion 1000 mL 100 mL/hr at 06/06/12 1552  . vasopressin (PITRESSIN) infusion - *FOR SHOCK*          ROS- no angina, no claudication, no melena, no hematochezia, no gross hematuria, no renal colic, + decreased force of urinary stream, + nocturia, no doe, no orthopnea, no purulent sputum, no hemoptysis, no weight gain or weight loss, no cold or heat intolerance. He has had shaking chills for the last week, diarrhea for the last 4 days. He has had (according to Dr. Renae [name]) erythema of both lower extremities for the last several months  Physical Exam Blood pressure 80/39, pulse 119 (irregular), temperature 102.6 F (39.2 C), temperature source Rectal, resp. rate 29, height 6\' 1"  (1.854 m), weight 131.543 kg (290 lb), SpO2 100.00%. General-shivering and tremulous Chest-3 lumen cath L subclavian, clear Heart-no rub Abd-nontender Extr-trace edema, bilateral TKR incisions ar clean,erythema of  shins Neuro- right-handed, tremulous    Lab- Hemoglobin 9.4, WBC 3600, PLT 44K Sodium 128, potassium 3.4, chloride 98, CO2 10, BUN 117, CR 10, calcium 6.6, albumin 2.2, lactic acid 0.9 PT 43 seconds, INR 4.99, PTT 85 (on Pradaxa) U A.-pH 6, SG 1.022, 4+ protein, large blood, 0-2 wbc's, TNTC RBCs (traumatic Foley catheterization)  CXR--no focal airspace disease, mild elevation of right hemidiaphragm  Assessment/Plan: 1. Hypotension--currently being given IV (isotonic) sodium bicarbonate and norepinephrine, blood  cultures  and sputum cultures have been sent.  Currently on vancomycin and Zosyn 2. Acute renal failure (creatinine 1.0 March 2014)-renal ultrasound has been ordered.  UA currently not helpful secondary to traumatic catheterization. Continue IV fluids and pressors 3. Metabolic acidosis--lactate not elevated. I  suspect from diarrhea. Continue with isotonic bicarbonate 4. Diarrhea--None yet today. Will send stool for culture and C. difficile. He has a well at home. May need to check well if he grows enteric pathogen 5. Atrial fibrillation--had been on Pradaxa and dronedarone PTA--on none now 6. Thrombocytopenia--checking PBS for schistocytes, checking LDH to be certain he does not haveTTP. I suspect low platelets are from sepsis 7. Coagulopathy--had been on Pradaxa.        [name] 06/06/2012, 4:27 PM

## 2012-06-06 NOTE — H&P (Addendum)
PULMONARY  / CRITICAL CARE MEDICINE  Name: Brett [name] MRN: [id number] DOB: 1945/08/22    ADMISSION DATE:  06/06/2012 CONSULTATION DATE:  06/06/2012  REFERRING MD :  EDP PRIMARY SERVICE: PCCM  CHIEF COMPLAINT:  Tired and SOB  BRIEF PATIENT DESCRIPTION: 67 year old male with history of HTN and a-fib who has not been feeling well for two weeks, starting having shaking chills on a nightly bases.  Four days ago started noticing diarrhea that was not bloody.  Not initiated by any particular event.  Patient continued to take his ACE and pradaxa.  Presents to the ED with BP in the 60's after 3 liters of fluid.  PCCM called to admit as a code sepsis.  SIGNIFICANT EVENTS / STUDIES:  5/30 Septic shock, admit to ICU.  LINES / TUBES: L [place] TLC 5/30>>> R a-line 5/30>>>  CULTURES: Blood 5/30>>> Urine 5/30>>> Sputum 5/30>>>  ANTIBIOTICS: Vanc 5/30>>> Zosyn 5/30>>>  PAST MEDICAL HISTORY :  Past Medical History  Diagnosis Date  . Persistent atrial fibrillation 02/2011    Negative Myoview; Normal EF by Echo  . Obesity (BMI 30-39.9)   . Hypertension   . Dyslipidemia    Past Surgical History  Procedure Laterality Date  . Joint replacement    . Cardioversion  06/08/2011    Procedure: CARDIOVERSION;  Surgeon: Marykay Lex, MD;  Location: [facility] OR;  Service: Cardiovascular;  Laterality: N/A;   Prior to Admission medications   Medication Sig Start Date End Date Taking? Authorizing Provider  aspirin EC 81 MG tablet Take 81 mg by mouth daily.   Yes Historical Provider, MD  dabigatran (PRADAXA) 150 MG CAPS Take 150 mg by mouth every 12 (twelve) hours.   Yes Historical Provider, MD  dronedarone (MULTAQ) 400 MG tablet Take 400 mg by mouth 2 (two) times daily with a meal.   Yes Historical Provider, MD  fexofenadine (ALLEGRA) 180 MG tablet Take 180 mg by mouth daily.   Yes Historical Provider, MD  lisinopril-hydrochlorothiazide (PRINZIDE,ZESTORETIC) 20-25 MG per tablet Take 0.5 tablets by mouth  daily.   Yes Historical Provider, MD  metoprolol succinate (TOPROL-XL) 100 MG 24 hr tablet Take 100 mg by mouth daily. Take with or immediately following a meal.   Yes Historical Provider, MD  Multiple Vitamin (MULITIVITAMIN WITH MINERALS) TABS Take 1 tablet by mouth daily.   Yes Historical Provider, MD  niacin (NIASPAN) 500 MG CR tablet Take 500 mg by mouth at bedtime.   Yes Historical Provider, MD   No Known Allergies  FAMILY HISTORY:  No family history on file. SOCIAL HISTORY:  reports that he has quit smoking. His smoking use included Cigars. He does not have any smokeless tobacco history on file. He reports that he does not drink alcohol or use illicit drugs.  REVIEW OF SYSTEMS:  Patient is lethargic but 12 point ROS is negative other than tremor and diarrhea.  SUBJECTIVE: Tired, diarrhea.  VITAL SIGNS: Temp:  [99 F (37.2 C)-102.6 F (39.2 C)] 102.6 F (39.2 C) (05/30 1122) Pulse Rate:  [148] 148 (05/30 1058) Resp:  [30-38] 38 (05/30 1148) BP: (80-116)/(39-99) 80/39 mmHg (05/30 1148) SpO2:  [96 %-100 %] 100 % (05/30 1148) Weight:  [131.543 kg (290 lb)] 131.543 kg (290 lb) (05/30 1050) HEMODYNAMICS:   VENTILATOR SETTINGS:   INTAKE / OUTPUT: Intake/Output   None     PHYSICAL EXAMINATION: General:  Chronically ill appearing obese male in moderate respiratory distress. Neuro:  Lethargic but alert and interactive, follows command and  moves all ext to command. HEENT:  [place]/AT, PERRL, EOM-I and DMM. Cardiovascular:  IRIR, Nl S1/S2, -M/R/G. Lungs:  CTA bilaterally. Abdomen:  Soft, NT, ND and +BS. Musculoskeletal:  -edema and -tenderness. Skin:  Both lower ext with multiple lesions.  LABS:  Recent Labs Lab 06/06/12 1122 06/06/12 1132 06/06/12 1150  HGB 12.4*  --   --   WBC 4.7  --   --   PLT 51*  --   --   NA 125*  --   --   K 3.9  --   --   CL 88*  --   --   CO2 13*  --   --   GLUCOSE 153*  --   --   BUN 124*  --   --   CREATININE 11.11*  --   --   CALCIUM  8.1*  --   --   AST 178*  --   --   ALT 93*  --   --   ALKPHOS 51  --   --   BILITOT 0.9  --   --   PROT 7.1  --   --   ALBUMIN 2.9*  --   --   LATICACIDVEN  --   --  1.90  TROPONINI  --  <0.30  --    No results found for this basename: GLUCAP,  in the last 168 hours  CXR: Low lung volumes but no acute findings.  ASSESSMENT / PLAN:  PULMONARY A: Increase RR to compensate for metabolic acidosis but able to protect his airway.  OSA by history. P:   - QHS CPAP. - O2 as needed. - No need for respiratory support at this time.  CARDIOVASCULAR A: Shock likely due to hypovolemia.  I see no indication of infection at this time other than possibly the legs with cellulitis. P:  - See ID section. - CVPs as ordered. - Volume resuscitation. - 2D echo.  RENAL A:  ARF, no history of renal disease.  Likely a combination of dehydration and ACE use.  No indication for acute dialysis. P:   - Renal U/S. - Hydration. - Monitor lytes closely. - Bicarb drip. - Renal consult.  GASTROINTESTINAL A:  Diarrhea. P:   - Stool culture. - NPO.  HEMATOLOGIC A:  Thrombocytopenia of unknown cause.  ?TTP, ?HSP, ?E. coli. P:  - Peripheral smear. - Stool culture. - Abx. - DIC panel.  INFECTIOUS A:  Likely bowel or cellulitis. P:   - Cultures as above. - Abx as above.  ENDOCRINE A:  ?DM, none by history.   P:   - ISS.  NEUROLOGIC A:  Lethargy likely due to hypotension.  Non-focal.  ?uremia. P:   - Head CT without contrast. - Minimize sedating medications.  TODAY'S SUMMARY: Septic shock protocol.  Hydration and consults as ordered.  I have personally obtained a history, examined the patient, evaluated laboratory and imaging results, formulated the assessment and plan and placed orders.  CRITICAL CARE: The patient is critically ill with multiple organ systems failure and requires high complexity decision making for assessment and support, frequent evaluation and titration of  therapies, application of advanced monitoring technologies and extensive interpretation of multiple databases. Critical Care Time devoted to patient care services described in this note is 85 minutes.   Alyson Reedy, M.D. Pulmonary and Critical Care Medicine [facility] Pager: [phone]  06/06/2012, 1:21 PM

## 2012-06-06 NOTE — Progress Notes (Signed)
eLink Physician-Brief Progress Note Patient Name: Brett [name] DOB: 08-Nov-1945 MRN: [id number]  Date of Service  06/06/2012   HPI/Events of Note   AF-RVR Good BP now  eICU Interventions  Lopressor prn with BP parameters  (takes at home)   Intervention Category Major Interventions: Arrhythmia - evaluation and management  Britteney Ayotte V. 06/06/2012, 4:59 PM

## 2012-06-06 NOTE — ED Provider Notes (Addendum)
History     CSN: [id number]  Arrival date & time 06/06/12  1038   First MD Initiated Contact with Patient 06/06/12 1043      Chief Complaint  Patient presents with  . Code Sepsis  . Emesis  . Diarrhea  . Nausea     HPI Patient presents emergency apartment generalized weakness.  He reports severe weakness over the past 3-4 days with severe diarrhea.  He's had chills without documented fever at home.  He denies nausea and vomiting for me however to EMS he reported he was having nausea and vomiting.  It sounds as though the patient is having subjective fever at home.  Denies melena or hematochezia.  He has a history of atrial fibrillation.  He is on Permax for anticoagulation because of his A. fib.  He also has a history of hypertension and hyperlipidemia.  He is on an ACE inhibitor.  He reports new tremor of his bilateral upper extremities over the past 24 hours.  He denies alcohol abuse.  No cough or congestion.  No urinary complaints.   Past Medical History  Diagnosis Date  . Persistent atrial fibrillation 02/2011    Negative Myoview; Normal EF by Echo  . Obesity (BMI 30-39.9)   . Hypertension   . Dyslipidemia     Past Surgical History  Procedure Laterality Date  . Joint replacement    . Cardioversion  06/08/2011    Procedure: CARDIOVERSION;  Surgeon: Marykay Lex, MD;  Location: [name] OR;  Service: Cardiovascular;  Laterality: N/A;    No family history on file.  History  Substance Use Topics  . Smoking status: Former Smoker    Types: Cigars  . Smokeless tobacco: Not on file  . Alcohol Use: No     Comment: couple beers a year      Review of Systems  Allergies  Review of patient's allergies indicates no known allergies.  Home Medications   Current Outpatient Rx  Name  Route  Sig  Dispense  Refill  . aspirin EC 81 MG tablet   Oral   Take 81 mg by mouth daily.         . dabigatran (PRADAXA) 150 MG CAPS   Oral   Take 150 mg by mouth every 12 (twelve)  hours.         . dronedarone (MULTAQ) 400 MG tablet   Oral   Take 400 mg by mouth 2 (two) times daily with a meal.         . fexofenadine (ALLEGRA) 180 MG tablet   Oral   Take 180 mg by mouth daily.         Marland Kitchen lisinopril-hydrochlorothiazide (PRINZIDE,ZESTORETIC) 20-25 MG per tablet   Oral   Take 0.5 tablets by mouth daily.         . metoprolol succinate (TOPROL-XL) 100 MG 24 hr tablet   Oral   Take 100 mg by mouth daily. Take with or immediately following a meal.         . Multiple Vitamin (MULITIVITAMIN WITH MINERALS) TABS   Oral   Take 1 tablet by mouth daily.         . niacin (NIASPAN) 500 MG CR tablet   Oral   Take 500 mg by mouth at bedtime.           BP 80/39  Pulse 148  Temp(Src) 102.6 F (39.2 C) (Rectal)  Resp 38  Ht 6\' 1"  (1.854 m)  Wt 290 lb (131.543  kg)  BMI 38.27 kg/m2  SpO2 100%  Physical Exam  ED Course  Procedures (including critical care time)  Angiocath insertion Performed by: Lyanne Co Consent: Verbal consent obtained. Risks and benefits: risks, benefits and alternatives were discussed Time out: Immediately prior to procedure a "time out" was called to verify the correct patient, procedure, equipment, support staff and site/side marked as required. Preparation: Patient was prepped and draped in the usual sterile fashion. Vein Location: Left external jugular Gauge: 20 Normal blood return and flush without difficulty Patient tolerance: Patient tolerated the procedure well with no immediate complications.  CRITICAL CARE Performed by: Lyanne Co Total critical care time: 40 Critical care time was exclusive of separately billable procedures and treating other patients. Critical care was necessary to treat or prevent imminent or life-threatening deterioration. Critical care was time spent personally by me on the following activities: development of treatment plan with patient and/or surrogate as well as nursing, discussions  with consultants, evaluation of patient's response to treatment, examination of patient, obtaining history from patient or surrogate, ordering and performing treatments and interventions, ordering and review of laboratory studies, ordering and review of radiographic studies, pulse oximetry and re-evaluation of patient's condition.   Date: 06/06/2012  Rate: 154  Rhythm: afib with RVR  QRS Axis: normal  Intervals: normal  ST/T Wave abnormalities: normal  Conduction Disutrbances: none  Narrative Interpretation:   Old EKG Reviewed: changed from prior ecg     Labs Reviewed  CBC - Abnormal; Notable for the following:    Hemoglobin 12.4 (*)    HCT 34.9 (*)    MCV 74.6 (*)    Platelets 51 (*)    All other components within normal limits  COMPREHENSIVE METABOLIC PANEL - Abnormal; Notable for the following:    Sodium 125 (*)    Chloride 88 (*)    CO2 13 (*)    Glucose, Bld 153 (*)    BUN 124 (*)    Creatinine, Ser 11.11 (*)    Calcium 8.1 (*)    Albumin 2.9 (*)    AST 178 (*)    ALT 93 (*)    GFR calc non Af Amer 4 (*)    GFR calc Af Amer 5 (*)    All other components within normal limits  CULTURE, BLOOD (ROUTINE X 2)  CULTURE, BLOOD (ROUTINE X 2)  URINE CULTURE  ETHANOL  TROPONIN I  URINALYSIS, ROUTINE W REFLEX MICROSCOPIC  CORTISOL  CG4 I-STAT (LACTIC ACID)   No results found.    1. septic shock 2.  Acute Renal failure 3.  Dehydration  4.  Atrial fibrillation   MDM  The patient presents with signs concerning for sepsis with a rectal temperature 100.2 and a heart rate of 148.  His blood pressure on arrival of emergency apartment 116/99.  Facial be initiated on 2 L IV fluids.  Antibiotics will be started, vancomycin and Zosyn.  Septic workup to be initiated.  Will continue to evaluate closely  12:27 PM Blood pressure 85 systolic after 2 L of fluid  12:39 PM Patient is now 2-1/2 liters and then his blood pressure is 76 systolic.  Critical care will be contacted for  early goal-directed therapy.  Antibiotics been given are ready.  The patient restarted on norepinephrine.  Acute renal failure with a normal potassium.  This is likely secondary to dehydration and his ACE inhibitor.      Lyanne Co, MD 06/06/12 1253  Lyanne Co, MD 06/06/12 1255

## 2012-06-06 NOTE — ED Provider Notes (Signed)
1:06 PM Critical care at the bedside now.  Upper 70s systolic. On levophed. Liters 4-5 going now  Lyanne Co, MD 06/06/12 [phone]

## 2012-06-06 NOTE — ED Notes (Signed)
Per EMS, they were called by patient's brother this morning to advised that patient has had N/V/D x 4 days with fever.   Patient states his diarrhea has made him weak.

## 2012-06-06 NOTE — Progress Notes (Signed)
  Echocardiogram 2D Echocardiogram has been performed.  Brett [name] 06/06/2012, 6:46 PM

## 2012-06-06 NOTE — Procedures (Signed)
Central Venous Catheter Insertion Procedure Note CHIEF WALKUP [id number] 06-10-1945  Procedure: Insertion of Central Venous Catheter Indications: Assessment of intravascular volume, Drug and/or fluid administration and Frequent blood sampling  Procedure Details Consent: Risks of procedure as well as the alternatives and risks of each were explained to the (patient/caregiver).  Consent for procedure obtained. Time Out: Verified patient identification, verified procedure, site/side was marked, verified correct patient position, special equipment/implants available, medications/allergies/relevent history reviewed, required imaging and test results available.  Performed  Maximum sterile technique was used including antiseptics, cap, gloves, gown, hand hygiene, mask and sheet. Skin prep: Chlorhexidine; local anesthetic administered A antimicrobial bonded/coated triple lumen catheter was placed in the left subclavian vein using the Seldinger technique.  Evaluation Blood flow good Complications: No apparent complications Patient did tolerate procedure well. Chest X-ray ordered to verify placement.  CXR: pending.  U/S used in placement.  YACOUB,WESAM 06/06/2012, 2:25 PM

## 2012-06-06 NOTE — Procedures (Signed)
Arterial Catheter Insertion Procedure Note [name] [id number] Nov 26, 1945  Procedure: Insertion of Arterial Catheter  Indications: Blood pressure monitoring and Frequent blood sampling  Procedure Details Consent: Risks of procedure as well as the alternatives and risks of each were explained to the (patient/caregiver).  Consent for procedure obtained. Time Out: Verified patient identification, verified procedure, site/side was marked, verified correct patient position, special equipment/implants available, medications/allergies/relevent history reviewed, required imaging and test results available.  Performed  Maximum sterile technique was used including antiseptics, cap, gloves, gown, hand hygiene, mask and sheet. Skin prep: Chlorhexidine; local anesthetic administered 20 gauge catheter was inserted into right radial artery using the Seldinger technique.  Evaluation Blood flow good; BP tracing good. Complications: No apparent complications.   Brett [name] 06/06/2012

## 2012-06-06 NOTE — Progress Notes (Signed)
UR Completed.  Brett [name] [phone] 06/06/2012

## 2012-06-07 ENCOUNTER — Inpatient Hospital Stay (HOSPITAL_COMMUNITY): Payer: Medicare Other

## 2012-06-07 DIAGNOSIS — J9600 Acute respiratory failure, unspecified whether with hypoxia or hypercapnia: Secondary | ICD-10-CM | POA: Diagnosis present

## 2012-06-07 DIAGNOSIS — R578 Other shock: Secondary | ICD-10-CM

## 2012-06-07 LAB — BLOOD GAS, ARTERIAL
Acid-base deficit: 5.7 mmol/L — ABNORMAL HIGH (ref 0.0–2.0)
O2 Content: 3 L/min
Patient temperature: 98.6
TCO2: 18.9 mmol/L (ref 0–100)
pH, Arterial: 7.416 (ref 7.350–7.450)

## 2012-06-07 LAB — COMPREHENSIVE METABOLIC PANEL
ALT: 71 U/L — ABNORMAL HIGH (ref 0–53)
Alkaline Phosphatase: 45 U/L (ref 39–117)
BUN: 124 mg/dL — ABNORMAL HIGH (ref 6–23)
CO2: 16 mEq/L — ABNORMAL LOW (ref 19–32)
Calcium: 7.2 mg/dL — ABNORMAL LOW (ref 8.4–10.5)
GFR calc Af Amer: 6 mL/min — ABNORMAL LOW (ref >90)
GFR calc non Af Amer: 5 mL/min — ABNORMAL LOW (ref >90)
Glucose, Bld: 192 mg/dL — ABNORMAL HIGH (ref 70–99)
Potassium: 3.6 mEq/L (ref 3.5–5.1)
Total Protein: 5.6 g/dL — ABNORMAL LOW (ref 6.0–8.3)

## 2012-06-07 LAB — CBC
HCT: 23.2 % — ABNORMAL LOW (ref 39.0–52.0)
HCT: 26.5 % — ABNORMAL LOW (ref 39.0–52.0)
Hemoglobin: 8.3 g/dL — ABNORMAL LOW (ref 13.0–17.0)
Hemoglobin: 9.4 g/dL — ABNORMAL LOW (ref 13.0–17.0)
MCH: 26.6 pg (ref 26.0–34.0)
MCH: 26.6 pg (ref 26.0–34.0)
MCH: 26.7 pg (ref 26.0–34.0)
MCHC: 35.8 g/dL (ref 30.0–36.0)
MCHC: 35.7 g/dL (ref 30.0–36.0)
MCHC: 35.5 g/dL (ref 30.0–36.0)
MCHC: 35.3 g/dL (ref 30.0–36.0)
MCV: 74.5 fL — ABNORMAL LOW (ref 78.0–100.0)
MCV: 75.7 fL — ABNORMAL LOW (ref 78.0–100.0)
MCV: 75.8 fL — ABNORMAL LOW (ref 78.0–100.0)
Platelets: 34 10*3/uL — ABNORMAL LOW (ref 150–400)
Platelets: 35 10*3/uL — ABNORMAL LOW (ref 150–400)
RBC: 3.12 MIL/uL — ABNORMAL LOW (ref 4.22–5.81)
RBC: 2.78 MIL/uL — ABNORMAL LOW (ref 4.22–5.81)
RDW: 15.1 % (ref 11.5–15.5)
RDW: 15 % (ref 11.5–15.5)

## 2012-06-07 LAB — POCT I-STAT 3, ART BLOOD GAS (G3+)
O2 Saturation: 100 %
pCO2 arterial: 40.9 mmHg (ref 35.0–45.0)
pH, Arterial: 7.433 (ref 7.350–7.450)
pO2, Arterial: 388 mmHg — ABNORMAL HIGH (ref 80.0–100.0)

## 2012-06-07 LAB — PROTIME-INR: Prothrombin Time: 21.3 seconds — ABNORMAL HIGH (ref 11.6–15.2)

## 2012-06-07 LAB — URINE CULTURE
Colony Count: NO GROWTH
Culture: NO GROWTH

## 2012-06-07 LAB — TROPONIN I: Troponin I: <0.3 ng/mL (ref <0.30)

## 2012-06-07 LAB — RENAL FUNCTION PANEL
CO2: 20 mEq/L (ref 19–32)
GFR calc Af Amer: 6 mL/min — ABNORMAL LOW (ref >90)
Glucose, Bld: 161 mg/dL — ABNORMAL HIGH (ref 70–99)
Potassium: 3.2 mEq/L — ABNORMAL LOW (ref 3.5–5.1)
Sodium: 135 mEq/L (ref 135–145)

## 2012-06-07 LAB — GLUCOSE, CAPILLARY
Glucose-Capillary: 125 mg/dL — ABNORMAL HIGH (ref 70–99)
Glucose-Capillary: 120 mg/dL — ABNORMAL HIGH (ref 70–99)
Glucose-Capillary: 183 mg/dL — ABNORMAL HIGH (ref 70–99)
Glucose-Capillary: 175 mg/dL — ABNORMAL HIGH (ref 70–99)

## 2012-06-07 LAB — PREPARE RBC (CROSSMATCH)

## 2012-06-07 LAB — HEPATITIS B SURFACE ANTIGEN: Hepatitis B Surface Ag: NEGATIVE

## 2012-06-07 LAB — BASIC METABOLIC PANEL
CO2: 20 mEq/L (ref 19–32)
Calcium: 7.1 mg/dL — ABNORMAL LOW (ref 8.4–10.5)
Creatinine, Ser: 9.46 mg/dL — ABNORMAL HIGH (ref 0.50–1.35)
GFR calc non Af Amer: 5 mL/min — ABNORMAL LOW (ref >90)

## 2012-06-07 LAB — LACTIC ACID, PLASMA: Lactic Acid, Venous: 1.6 mmol/L (ref 0.5–2.2)

## 2012-06-07 LAB — DIC (DISSEMINATED INTRAVASCULAR COAGULATION)PANEL
Fibrinogen: 344 mg/dL (ref 204–475)
Platelets: 36 10*3/uL — ABNORMAL LOW (ref 150–400)
Smear Review: NONE SEEN
aPTT: 83 seconds — ABNORMAL HIGH (ref 24–37)

## 2012-06-07 LAB — LACTATE DEHYDROGENASE: LDH: 352 U/L — ABNORMAL HIGH (ref 94–250)

## 2012-06-07 LAB — MAGNESIUM
Magnesium: 2 mg/dL (ref 1.5–2.5)
Magnesium: 1.9 mg/dL (ref 1.5–2.5)

## 2012-06-07 LAB — HEPATITIS B CORE ANTIBODY, TOTAL: Hep B Core Total Ab: NEGATIVE

## 2012-06-07 LAB — TECHNOLOGIST SMEAR REVIEW

## 2012-06-07 MED ORDER — SODIUM CHLORIDE 0.9 % IV SOLN
25.0000 ug/h | INTRAVENOUS | Status: DC
  Administered 2012-06-07: 75 ug/h via INTRAVENOUS
  Filled 2012-06-07 (×2): qty 50

## 2012-06-07 MED ORDER — ANTICOAGULANT SODIUM CITRATE 4% (200MG/5ML) IV SOLN
5.0000 mL | Freq: Once | Status: AC
  Administered 2012-06-07: 5 mL
  Filled 2012-06-07: qty 250

## 2012-06-07 MED ORDER — LIDOCAINE HCL (PF) 1 % IJ SOLN: 5.0000 mL | INTRAMUSCULAR | Status: DC | PRN

## 2012-06-07 MED ORDER — SODIUM CHLORIDE 0.9 % IV SOLN: INTRAVENOUS | Status: AC

## 2012-06-07 MED ORDER — FENTANYL CITRATE 0.05 MG/ML IJ SOLN: 100.0000 ug | Freq: Once | INTRAMUSCULAR | Status: AC

## 2012-06-07 MED ORDER — ETOMIDATE 2 MG/ML IV SOLN
10.0000 mg | Freq: Once | INTRAVENOUS | Status: AC
  Administered 2012-06-07: 10 mg via INTRAVENOUS

## 2012-06-07 MED ORDER — PENTAFLUOROPROP-TETRAFLUOROETH EX AERO: 1.0000 "application " | INHALATION_SPRAY | CUTANEOUS | Status: DC | PRN

## 2012-06-07 MED ORDER — ACETAMINOPHEN 325 MG PO TABS: 650.0000 mg | ORAL_TABLET | ORAL | Status: DC | PRN

## 2012-06-07 MED ORDER — NEPRO/CARBSTEADY PO LIQD: 237.0000 mL | ORAL | Status: DC | PRN

## 2012-06-07 MED ORDER — PRO-STAT SUGAR FREE PO LIQD
60.0000 mL | Freq: Three times a day (TID) | ORAL | Status: DC
  Administered 2012-06-07 – 2012-06-11 (×12): 60 mL
  Filled 2012-06-07 (×17): qty 60

## 2012-06-07 MED ORDER — DILTIAZEM HCL 100 MG IV SOLR
5.0000 mg/h | INTRAVENOUS | Status: DC
  Administered 2012-06-07: 5 mg/h via INTRAVENOUS
  Administered 2012-06-07 – 2012-06-08 (×2): 10 mg/h via INTRAVENOUS
  Administered 2012-06-10 – 2012-06-11 (×3): 15 mg/h via INTRAVENOUS
  Administered 2012-06-11: 8 mg/h via INTRAVENOUS
  Administered 2012-06-11: 15 mg/h via INTRAVENOUS
  Filled 2012-06-07 (×12): qty 100

## 2012-06-07 MED ORDER — ACD FORMULA A 0.73-2.45-2.2 GM/100ML VI SOLN
500.0000 mL | Status: DC
  Administered 2012-06-07 – 2012-06-08 (×2): 500 mL via INTRAVENOUS
  Filled 2012-06-07: qty 500

## 2012-06-07 MED ORDER — CALCIUM CARBONATE ANTACID 500 MG PO CHEW: 2.0000 | CHEWABLE_TABLET | ORAL | Status: DC

## 2012-06-07 MED ORDER — FENTANYL CITRATE 0.05 MG/ML IJ SOLN
INTRAMUSCULAR | Status: AC
  Filled 2012-06-07: qty 4

## 2012-06-07 MED ORDER — ALTEPLASE 2 MG IJ SOLR
2.0000 mg | Freq: Once | INTRAMUSCULAR | Status: AC | PRN
  Filled 2012-06-07: qty 2

## 2012-06-07 MED ORDER — BIOTENE DRY MOUTH MT LIQD
15.0000 mL | Freq: Four times a day (QID) | OROMUCOSAL | Status: DC
  Administered 2012-06-07 – 2012-06-14 (×27): 15 mL via OROMUCOSAL

## 2012-06-07 MED ORDER — CHLORHEXIDINE GLUCONATE CLOTH 2 % EX PADS
6.0000 | MEDICATED_PAD | Freq: Every day | CUTANEOUS | Status: AC
  Administered 2012-06-07 – 2012-06-11 (×5): 6 via TOPICAL

## 2012-06-07 MED ORDER — "THROMBI-PAD 3""X3"" EX PADS"
1.0000 | MEDICATED_PAD | Freq: Once | CUTANEOUS | Status: AC
  Administered 2012-06-07: 1 via TOPICAL

## 2012-06-07 MED ORDER — VANCOMYCIN HCL IN DEXTROSE 1-5 GM/200ML-% IV SOLN
1000.0000 mg | Freq: Once | INTRAVENOUS | Status: AC
  Administered 2012-06-07: 1000 mg via INTRAVENOUS
  Filled 2012-06-07: qty 200

## 2012-06-07 MED ORDER — VITAL 1.5 CAL PO LIQD
1000.0000 mL | ORAL | Status: DC
  Administered 2012-06-07 – 2012-06-11 (×4): 1000 mL
  Filled 2012-06-07 (×9): qty 1000

## 2012-06-07 MED ORDER — MIDAZOLAM HCL 2 MG/2ML IJ SOLN
1.0000 mg | INTRAMUSCULAR | Status: DC | PRN
  Administered 2012-06-07 (×4): 2 mg via INTRAVENOUS
  Administered 2012-06-09: 1 mg via INTRAVENOUS
  Administered 2012-06-09 – 2012-06-11 (×4): 2 mg via INTRAVENOUS
  Filled 2012-06-07 (×8): qty 2

## 2012-06-07 MED ORDER — HEPARIN SODIUM (PORCINE) 1000 UNIT/ML DIALYSIS: 1000.0000 [IU] | INTRAMUSCULAR | Status: DC | PRN

## 2012-06-07 MED ORDER — MIDAZOLAM HCL 2 MG/2ML IJ SOLN
INTRAMUSCULAR | Status: AC
  Administered 2012-06-07: 2 mg
  Filled 2012-06-07: qty 4

## 2012-06-07 MED ORDER — CHLORHEXIDINE GLUCONATE 0.12 % MT SOLN
15.0000 mL | Freq: Two times a day (BID) | OROMUCOSAL | Status: DC
  Administered 2012-06-07 – 2012-06-12 (×10): 15 mL via OROMUCOSAL
  Filled 2012-06-07 (×10): qty 15

## 2012-06-07 MED ORDER — ROCURONIUM BROMIDE 50 MG/5ML IV SOLN
50.0000 mg | Freq: Once | INTRAVENOUS | Status: AC
  Administered 2012-06-07: 50 mg via INTRAVENOUS
  Filled 2012-06-07: qty 5

## 2012-06-07 MED ORDER — SODIUM CHLORIDE 0.9 % IV SOLN: 100.0000 mL | INTRAVENOUS | Status: DC | PRN

## 2012-06-07 MED ORDER — DOXYCYCLINE HYCLATE 100 MG IV SOLR
100.0000 mg | Freq: Two times a day (BID) | INTRAVENOUS | Status: DC
  Administered 2012-06-07 – 2012-06-12 (×10): 100 mg via INTRAVENOUS
  Filled 2012-06-07 (×11): qty 100

## 2012-06-07 MED ORDER — METHYLPREDNISOLONE SODIUM SUCC 125 MG IJ SOLR
80.0000 mg | Freq: Once | INTRAMUSCULAR | Status: AC
  Administered 2012-06-07: 80 mg via INTRAVENOUS
  Filled 2012-06-07: qty 1.28

## 2012-06-07 MED ORDER — FENTANYL CITRATE 0.05 MG/ML IJ SOLN
25.0000 ug | INTRAMUSCULAR | Status: DC | PRN
  Administered 2012-06-07 – 2012-06-09 (×4): 50 ug via INTRAVENOUS
  Administered 2012-06-09: 25 ug via INTRAVENOUS
  Administered 2012-06-10 – 2012-06-11 (×5): 50 ug via INTRAVENOUS
  Filled 2012-06-07 (×10): qty 2

## 2012-06-07 MED ORDER — SODIUM CHLORIDE 0.9 % IV SOLN
4.0000 g | Freq: Once | INTRAVENOUS | Status: AC
  Administered 2012-06-07: 4 g via INTRAVENOUS
  Filled 2012-06-07: qty 40

## 2012-06-07 MED ORDER — MIDAZOLAM HCL 2 MG/2ML IJ SOLN
2.0000 mg | Freq: Once | INTRAMUSCULAR | Status: AC
Start: 2012-06-07 — End: 2012-06-07

## 2012-06-07 MED ORDER — LIDOCAINE-PRILOCAINE 2.5-2.5 % EX CREA: 1.0000 "application " | TOPICAL_CREAM | CUTANEOUS | Status: DC | PRN

## 2012-06-07 MED ORDER — MUPIROCIN 2 % EX OINT
1.0000 "application " | TOPICAL_OINTMENT | Freq: Two times a day (BID) | CUTANEOUS | Status: AC
  Administered 2012-06-07 – 2012-06-11 (×10): 1 via NASAL
  Filled 2012-06-07: qty 22

## 2012-06-07 MED ORDER — FENTANYL BOLUS VIA INFUSION
25.0000 ug | Freq: Four times a day (QID) | INTRAVENOUS | Status: DC | PRN
  Filled 2012-06-07: qty 100

## 2012-06-07 MED ORDER — DIPHENHYDRAMINE HCL 25 MG PO CAPS: 25.0000 mg | ORAL_CAPSULE | Freq: Four times a day (QID) | ORAL | Status: DC | PRN

## 2012-06-07 MED ORDER — VANCOMYCIN HCL 10 G IV SOLR
1750.0000 mg | INTRAVENOUS | Status: DC
  Administered 2012-06-09: 1750 mg via INTRAVENOUS
  Filled 2012-06-07 (×2): qty 1750

## 2012-06-07 NOTE — Procedures (Signed)
[name]  On HD via L fem vein temporary cath to reverse Pradaxa BP--110/78  Goal--keep even Hgb--7.4.  Dr. Gaylyn Rong has ordered 2 U PRBC to be given in HD  K+--3.2 (on 4 K bath)

## 2012-06-07 NOTE — Progress Notes (Signed)
Pt taken off home CPAP machine by RT and placed on 2lpm [place]. Pt tolerating at this time, SpO2 98%. RT will continue to monitor.

## 2012-06-07 NOTE — Procedures (Signed)
Intubation Procedure Note Brett [name] [id number] 12-Feb-1945  Procedure: Intubation Indications: Airway protection and maintenance  Procedure Details Consent: Risks of procedure as well as the alternatives and risks of each were explained to the (patient/caregiver).  Consent for procedure obtained. Time Out: Verified patient identification, verified procedure, site/side was marked, verified correct patient position, special equipment/implants available, medications/allergies/relevent history reviewed, required imaging and test results available.  Performed  Maximum sterile technique was used including antiseptics, gloves, hand hygiene and mask.  MAC    Evaluation Hemodynamic Status: BP stable throughout; O2 sats: stable throughout Patient's Current Condition: stable Complications: No apparent complications Patient did tolerate procedure well. Chest X-ray ordered to verify placement.  CXR: pending.  Significant blood clots in the upper airway.  Brett [name] 06/07/2012

## 2012-06-07 NOTE — Progress Notes (Signed)
67 yo with h/o AFib on Pradaxa, cleaned his barn approximately 1 week ago after which he developed diarrhea;   Objective: ARF BUN 124->117->124, Cr 11.11->10.03-->9.6; metabolic acidosis, hypocoagulable state with elevated PT, INR, PTT, thrombocytopenia;   FFP given during the day; minimal change in coags.   [facility] given for possible pradaxa toxicity;   Hematology consulted; Dr. Gaylyn Rong believes there is a component of HUS/TTP sontributing to patient's presentation; top receive plasmapheresis later today;   Nephrology called; HD will be attempted both to help with pradaxa toxicity and with ARF and urermia.   HD catheter now in place.

## 2012-06-07 NOTE — Significant Event (Signed)
CRITICAL VALUE ALERT  Critical value received:  + blood cultures; gram positive cocci in clusters in aerobic bottle  Date of notification:  06/07/12  Time of notification:1738  Critical value read back: yes  Nurse who received alert: Burnard Bunting, RN  MD notified (1st page): Dr Molli Knock  Time of first page: 1740  Responding MD:Dr Molli Knock  Time MD responded: [phone]

## 2012-06-07 NOTE — Procedures (Signed)
Name:  Brett [name] MRN:  [id number] DOB:  Sep 29, 1945  PROCEDURE NOTE  Procedure:  Ultrasound-guided Hemodialysis catheter placement  Indications:  Need for intravenous access and hemodynamic monitoring.  Consent:  Informed consent obtained from daughter  Anesthesia:  A total of 10 mL of 1% Lidocaine was used for local infiltration anesthesia.  Procedure summary:  Appropriate equipment was assembled.  The patient was identified as Brett [name] and safety timeout was performed. The patient was placed in Supine position.  Sterile technique was used. The patient's right groin initally was prepped using chlorhexidine scrub and the field was draped in usual sterile fashion with full body drape. The right femoral vein and artery were identified by ultrasound, the patency was evaluated and images were documented. After the adequate anesthesia was achieved, the vein was cannulated with 1 attempt with the introducer needle under sonographic guidance without difficulty. A guide wire was advanced through the introducer needle, which was then withdrawn. A small skin incision was made at the point of wire entry, the first dilator was inserted over the guide wire and appropriate dilation was obtained, however wire was bent while dilating with the second dilator, and the procedure was aborted due to inability to insert the catheter over the wire.  Pressure was held for 10 minutes at which time a femostop was placed.  Nurses were directed to perform DP pulse checks and exam site for further bleeding.  No evidence of bleed 1 hour after application of femostop placed.    The left femoral artery was then prepped using chlorhexidine scrub and the field was draped in usual sterile fashion with full body drape. The left femoral vein and the left femoral artery were identified by ultrasound, the patency was evaluated and images were documented.  After the adequate anesthesia was achieved, the vein was cannulated  with one attempt with the introducer needle under sonographic guidance without difficulty. A guide wire was advanced through the introducer needle, which was then withdrawn. A small skin incision was made at the point of wire entry, the first dilator was inserted over the guide wire and appropriate dilation was obtained.  The second dilator was inserted over the guide wire and appropriate dilation was obtained.  The catheter was inserted over the wire without difficulty.    All ports were aspirated and flushed with normal saline without difficulty. The catheter was secured into place with sutures.  Antibiotic patch was placed and sterile dressing was applied.   Complications:  Note failed cannulation of right femoral vein with placement of femostop for hemostasis in a coagulopathic patient.  Family made aware of complication and all questions answered.  Hemodynamic parameters and oxygenation remained stable throughout the procedure.  Estimated blood loss:  Less then 40cc  Leontine Locket, M.D. Pulmonary and Critical Care Medicine [facility] Pager: [phone]  06/07/2012, 3:21 AM

## 2012-06-07 NOTE — Progress Notes (Signed)
Pt's home CPAP machine wiped down and given to family by RT to take home.

## 2012-06-07 NOTE — Consult Note (Signed)
Received calls from both Dr. Frederico Hamman CCM and Dr. Gaylyn Rong Hematology. Concerns:  1) persistent acidosis/bleeding possibly from pradaxa toxicity that could respond to HD (68% removed in a 4 hour treatment) 2) possibility of HUS-TTP in need of PLEX  CCM will place HD cath HD will be done for 4 hours/4K bath PLEX to follow with orders to be placed by Dr. Gaylyn Rong

## 2012-06-07 NOTE — Consult Note (Signed)
[facility]  Telephone:([phone] Fax:([phone]) [phone]     INITIAL HEMATOLOGY CONSULTATION    Referral MD:  PCCM  Reason for Referral: anemia, thrombocytopenia.   History was taken from daughter, sister, and brother.    HPI:  Brett [name] is a 67 yo man with hx of afib; on Pradaxa.  At baseline, he lives by himself and is very independent of activities of daily living. He developed diarrhea around 06/02/2012.  His relatives could not tell me if they were bloody or not or how much diarrhea he was having.  He has not had urine output since 06/04/12.  His brother found him on 06/05/12 very weak, deconditioned, and thus prompted emergency medical response.    Patient was hypotensive on admission on 06/05/12 with acute renal failure.  His baseline Cr in 03/2012 per family was normal.  He also has thrombocytopenia with Plt of 51.  He was started on empiric antibiotic with Zosyn and Vancomycin and was on pressor. His Pradaxa was discontinued  His blood pressure improved.  However, over the course of 1 day, he developed epistaxis, mouth bleed, bleeding from IV lines, hematuria, acute blood loss anemia, worsening thrombocytopenia, coagulopathy with elevated PT/PTT.  He received 4 units of FFP, DDAVP, FEIBA.  He still has bleeding, and worsening thrombocytopenia.  Therefore, PCCM kindly contacted hematology for consultation.  Patient was not able to give much history due to bleeding in the mouth, high grade fever, rigor.     Past Medical History  Diagnosis Date  . Persistent atrial fibrillation 02/2011    Negative Myoview; Normal EF by Echo  . Obesity (BMI 30-39.9)   . Hypertension   . Dyslipidemia   :    Past Surgical History  Procedure Laterality Date  . Joint replacement    . Cardioversion  06/08/2011    Procedure: CARDIOVERSION;  Surgeon: Marykay Lex, MD;  Location: [facility] OR;  Service: Cardiovascular;  Laterality: N/A;  :   CURRENT MEDS: Current  Facility-Administered Medications  Medication Dose Route Frequency Provider Last Rate Last Dose  . 0.9 %  sodium chloride infusion  1,000 mL Intravenous Continuous [name], MD 15 mL/hr at 06/06/12 1747 1,000 mL at 06/06/12 1747  . acetaminophen (TYLENOL) suppository 650 mg  650 mg Rectal Q4H PRN Roxine Caddy, MD   650 mg at 06/07/12 0007  . hydrocortisone sodium succinate (SOLU-CORTEF) 100 mg/2 mL injection 50 mg  50 mg Intravenous Q6H Alyson Reedy, MD   50 mg at 06/06/12 2026  . insulin aspart (novoLOG) injection 2-6 Units  2-6 Units Subcutaneous Q4H Alyson Reedy, MD      . metoprolol (LOPRESSOR) injection 2.5-5 mg  2.5-5 mg Intravenous Q3H PRN Oretha Milch, MD   5 mg at 06/06/12 2046  . norepinephrine (LEVOPHED) 4 mg in dextrose 5 % 250 mL infusion  2-50 mcg/min Intravenous Once Lyanne Co, MD      . norepinephrine (LEVOPHED) 4 mg in dextrose 5 % 250 mL infusion  5-50 mcg/min Intravenous Titrated Alyson Reedy, MD      . piperacillin-tazobactam (ZOSYN) IVPB 2.25 g  2.25 g Intravenous Q6H Christella Hartigan, RPH   2.25 g at 06/06/12 1842  . sodium bicarbonate 150 mEq in dextrose 5 % 1,000 mL infusion   Intravenous Continuous [name], MD 250 mL/hr at 06/06/12 2118    . sodium chloride 0.9 % bolus 1,000 mL  1,000 mL Intravenous PRN Wesam  Santa Genera, MD      . sodium chloride 0.9 % bolus 1,000 mL  1,000 mL Intravenous PRN Alyson Reedy, MD      . Melene Muller ON 06/08/2012] vancomycin (VANCOCIN) 1,750 mg in sodium chloride 0.9 % 500 mL IVPB  1,750 mg Intravenous Q48H Meera K Patel, RPH          No Known Allergies:  No family history on file.:  History   Social History  . Marital Status: Divorced    Spouse Name: N/A    Number of Children: N/A  . Years of Education: N/A   Occupational History  . Not on file.   Social History Main Topics  . Smoking status: Former Smoker    Types: Cigars  . Smokeless tobacco: Not on file  . Alcohol Use: No     Comment: couple beers a year  .  Drug Use: No  . Sexually Active: Not on file   Other Topics Concern  . Not on file   Social History Narrative  . No narrative on file  :  REVIEW OF SYSTEM:  Unable to obtain due to his acute illness.   Exam: Patient Vitals for the past 24 hrs:  BP Temp Temp src Pulse Resp SpO2 Height Weight  06/06/12 2315 - 103 F (39.4 C) - 128 31 - - -  06/06/12 2300 119/59 mmHg 103 F (39.4 C) - 122 32 - - -  06/06/12 2245 111/62 mmHg 103.1 F (39.5 C) - 124 33 - - -  06/06/12 2230 122/56 mmHg 103.1 F (39.5 C) - 125 33 - - -  06/06/12 2215 119/65 mmHg 103.1 F (39.5 C) - 121 32 - - -  06/06/12 2200 110/62 mmHg - - - - - - -  06/06/12 2130 120/63 mmHg 103.1 F (39.5 C) - 120 34 - - -  06/06/12 2100 122/74 mmHg 103 F (39.4 C) - 116 35 100 % - -  06/06/12 2045 103/64 mmHg 102.9 F (39.4 C) - 121 32 - - -  06/06/12 2030 99/75 mmHg 102.8 F (39.3 C) - 124 34 - - -  06/06/12 2015 115/83 mmHg 102.7 F (39.3 C) - 126 30 - - -  06/06/12 2000 125/61 mmHg 102.5 F (39.2 C) - 112 24 100 % - -  06/06/12 1900 113/62 mmHg 102.3 F (39.1 C) - 116 35 100 % - -  06/06/12 1800 91/59 mmHg - - 113 32 99 % - -  06/06/12 1745 98/52 mmHg - - 120 31 100 % - -  06/06/12 1730 96/54 mmHg - - 115 32 100 % - -  06/06/12 1715 103/48 mmHg - - 120 33 100 % - -  06/06/12 1700 105/54 mmHg - - 117 31 100 % - -  06/06/12 1645 97/60 mmHg - - 126 28 100 % - -  06/06/12 1630 118/55 mmHg - - 137 28 100 % - -  06/06/12 1615 105/59 mmHg - - 110 28 100 % - -  06/06/12 1600 103/58 mmHg - - 123 28 100 % - -  06/06/12 1545 102/76 mmHg - - 126 29 99 % - -  06/06/12 1530 89/49 mmHg - - 111 29 100 % - -  06/06/12 1515 - - - 115 27 100 % - -  06/06/12 1500 - - - 112 26 100 % - -  06/06/12 1440 - - - 119 29 100 % - -  06/06/12 1415 - - - 119 29  100 % - -  06/06/12 1400 - - - 122 25 100 % - -  06/06/12 1356 - - - 126 28 100 % - -  06/06/12 1349 - - - 126 26 100 % - -  06/06/12 1348 - - - 125 27 100 % - -  06/06/12 1345 -  - - 127 26 100 % - -  06/06/12 1330 - - - 122 29 100 % - -  06/06/12 1319 122/92 mmHg - - - - 100 % 5\' 11"  (1.803 m) 279 lb 5.2 oz (126.7 kg)  06/06/12 1300 82/35 mmHg - - 126 29 - - -  06/06/12 1148 80/39 mmHg - - - 38 100 % - -  06/06/12 1122 - 102.6 F (39.2 C) Rectal - - - - -  06/06/12 1058 116/99 mmHg 99 F (37.2 C) Oral 148 30 96 % - -  06/06/12 1050 - - - - - - 6\' 1"  (1.854 m) 290 lb (131.543 kg)    General:  Obese man, in rigor.  Eyes:  no scleral icterus.  ENT:  There was clotted blood in his oropharynx.  There was dry blood in his nose.  Lymphatics:  Negative cervical, supraclavicular or axillary adenopathy.  Respiratory: lungs were clear bilaterally without wheezing or crackles.  Cardiovascular:  Regular rate and rhythm, S1/S2, without murmur, rub or gallop.  There was no pedal edema.  GI:  abdomen was soft, flat, nontender, nondistended, without organomegaly.  There was blood in his Foley cath.  Muscoloskeletal:  no spinal tenderness of palpation of vertebral spine.  There was bruising from old IV sites and blood draw.  There was slow oozing of blood in the left EJ.   Patient was oriented to name and place.  Attention was poor due to rigor.    LABS:  Lab Results  Component Value Date   WBC 3.3* 06/06/2012   HGB 8.3* 06/06/2012   HCT 23.2* 06/06/2012   PLT 36* 06/06/2012   GLUCOSE 132* 06/06/2012   ALT 71* 06/06/2012   AST 142* 06/06/2012   NA 128* 06/06/2012   K 3.4* 06/06/2012   CL 98 06/06/2012   CREATININE 10.03* 06/06/2012   BUN 117* 06/06/2012   CO2 10* 06/06/2012   INR 3.80* 06/06/2012    Ct Abdomen Pelvis Wo Contrast  06/06/2012   *RADIOLOGY REPORT*  Clinical Data: Fever of unknown origin.  Sepsis.  Diarrhea.  CT ABDOMEN AND PELVIS WITHOUT CONTRAST  Technique:  Multidetector CT imaging of the abdomen and pelvis was performed following the standard protocol without intravenous contrast.  Comparison: None.  Findings: Moderate beam hardening artifact is seen throughout the  abdomen and pelvis due to the position of the patient's arms.  A Foley catheter is seen within the bladder.  Mildly enlarged prostate gland noted.  There is no evidence of urolithiasis or hydronephrosis.  There is mild asymmetric stranding and possibly a small amount of fluid posterior to the left kidney, which is suboptimally evaluated due to lack of contrast and beam hardening artifact.  This raises possibility of pyelonephritis.  Some motion artifact is also noted.  Mild splenomegaly is seen with spleen measuring approximately 14 cm length.  Noncontrast images of the liver, gallbladder, pancreas, and adrenal glands are normal in appearance.  No soft tissue masses are identified.  No other inflammatory process or abscess identified.  No evidence of dilated bowel loops.  Bone windows show osteolysis and loss of vertebral endplates at L4- 5, consistent with diskitis.  Degenerative disc disease is seen at all other lumbar levels however no other sites of osteolysis identified.  IMPRESSION:  1.  L4-5 diskitis, likely infectious in etiology. Recommend lumbar spine MRI without and with contrast for further evaluation. 2.  Suboptimal evaluation of the abdomen pelvis due to artifact. Asymmetric density posterior to the left kidney, possibly due to pyelonephritis.  No evidence of urolithiasis or hydronephrosis. 3.  Mild splenomegaly.   Original Report Authenticated By: Myles Rosenthal, M.D.   Ct Head Wo Contrast  06/06/2012   *RADIOLOGY REPORT*  Clinical Data: Altered mental status.  Sepsis.  CT HEAD WITHOUT CONTRAST  Technique:  Contiguous axial images were obtained from the base of the skull through the vertex without contrast.  Comparison: None.  Findings: There is no evidence of intracranial hemorrhage, brain edema or other signs of acute infarction.  There is no evidence of intracranial mass lesion or mass effect.  No abnormal extra-axial fluid collections are identified.  No evidence of hydronephrosis.  No evidence of  skull fracture or other bone lesions.  IMPRESSION: No acute intracranial findings.   Original Report Authenticated By: Myles Rosenthal, M.D.   Dg Chest Port 1 View  06/06/2012   *RADIOLOGY REPORT*  Clinical Data: Evaluate central line placement.  PORTABLE CHEST - 1 VIEW  Comparison: 06/06/2012  Findings: Left subclavian central line tip is in the SVC region. There is no evidence for a pneumothorax.  There continues to be slightly low lung volumes with mild elevation of the right hemidiaphragm.  Heart size is stable.  No focal airspace disease.  IMPRESSION: Central line tip in the SVC region.  No evidence for a pneumothorax.   Original Report Authenticated By: Richarda Overlie, M.D.   Dg Chest Portable 1 View  06/06/2012   *RADIOLOGY REPORT*  Clinical Data: Sepsis.  Emesis.  Diarrhea.  Nausea.  PORTABLE CHEST - 1 VIEW  Comparison: None.  Findings: 2 frontal views.  Moderate right hemidiaphragm elevation. Cardiomegaly accentuated by AP portable technique.  No pleural effusion or pneumothorax.  Low lung volumes with resultant pulmonary interstitial prominence.  Patchy bibasilar atelectasis.  IMPRESSION: Cardiomegaly and low lung volumes with mild bibasilar atelectasis. No acute findings.  If high clinical concern of acute cardiopulmonary disease, consider PA and lateral radiographs.   Original Report Authenticated By: Jeronimo Greaves, M.D.    Blood smear review:   I personally reviewed the patient's peripheral blood smear today.  There was anisocytosis and microcytosis.  There was no peripheral blast.  There were burred cells.  There was no target cell, rouleaux formation, tear drop cell.  There were rare schistocytes.There was no giant platelets or platelet clumps.      PROBLEM LIST:   1.  Afib; was on Pradaxa 2.  Diarrhea:  Last time Wed 06/04/12 3.  Acute renal failure 4.  Fever 5.  Acute anemia 6.  Thrombocytopenia 7.   Coagulopathy.   The length of time of the face-to-face encounter was 60 minutes. More  than 50% of time was spent counseling and coordination of care.   IMPRESSION:  Differential diagnosis:  Sepsis with fever/hypotension/recent diarrhea can explain the anemia/thrombocytopenia/renal failure/coagulpathy/elevated LDH.  However, he does not have DIC (given almost normal Ddimer and fibrinogen).  It is atypical to have such severe renal failure and acute drop in anemia and thrombocytopenia.   Another potential diagnosis is Pradaxa-induced coagulopathy in patients with diarrhea then renal failure.  Pradaxa toxicity can result in coagulopathy.  It then can cause anemia due to  bleeding and resultant consumptive thrombocytopenia.  However, I cannot explain elevated LDH in this scenario.   Another serious diagnosis that need to be entertained is TTP which can explain fever, renal failure, anemia/thrombcytopenia, elevated LDH.  There were no convincing schistocytosis on smear.  However, I cannot tell if the presence of severe burr-cell can mask schistocytosis.  ADAMTS 13 was sent; however, it takes 5-7 days to come back, and there issue was that he had already received FFP before AMAMTS13 was drawn which will make it false negative. Regardless, the mortality from TTP is high without plasmaphoresis.   I had a long family discussion with patient's daughter, sister, and brother. It is difficult to definitively assign any one of these diagnosis for sure.  Therefore, I recommend empiric approach to treat sepsis, renal failure and TTP.     RECOMMENDATIONS:  - Pending blood/urine/stool culture.  - Agree with empiric antibiotics. - Agree with hemodialysis with attempt to remove Pradaxa.  - I will arrange for daily plasmaphoresis after hemodialysis today.  His estimated plasma volume is about 5.6 Liter ( 0.065 x weight in kg x (1-hct) = 0.065 x 112 kg x (1-0.23) = 5.6 liters.  -  Risk to watch out for transient worsening in anemia, hypocalcemia, rigor, transfusion-related lung injury.  - Continue to  monitor CBC bid with transfusion for Hgb <7; platelet <10 or active profuse bleeding.     The length of time of the face-to-face encounter was 60 minutes. More than 50% of time was spent counseling and coordination of care.

## 2012-06-07 NOTE — Progress Notes (Signed)
ANTIBIOTIC CONSULT NOTE - FOLLOW UP  Pharmacy Consult for Vancomycin, Zosyn Indication: rule out sepsis  No Known Allergies  Patient Measurements: Height: 5\' 11"  (180.3 cm) Weight: 279 lb 5.2 oz (126.7 kg) IBW/kg (Calculated) : 75.3  Vital Signs: Temp: 103.5 F (39.7 C) (05/31 0927) Temp src: Core (Comment) (05/31 0615) BP: 123/59 mmHg (05/31 0927) Pulse Rate: 141 (05/31 1050) Intake/Output from previous day: 05/30 0701 - 05/31 0700 In: 6440.8 [I.V.:3715; Blood:2505.8; IV Piggyback:200] Out: 1825 [Urine:1825] Intake/Output from this shift: Total I/O In: 475 [I.V.:75; Blood:400] Out: 0   Labs:  Recent Labs  06/06/12 2233 06/06/12 2243 06/07/12 0354 06/07/12 0430 06/07/12 1045  WBC 3.3*  --  2.8*  --  4.3  HGB 8.3*  --  7.4*  --  9.4*  PLT 36* 36* 34*  --  44*  CREATININE 9.60*  --  9.46* 9.46*  --    Assessment: Mr.Antwi continues on empiric, broad spectrum abx for sepsis. CXR stable, no infiltrate or pulm edema. Continues to be febrile, WBC nml. Pt is s/p 4h HD this AM and has had 1.8L of UOP since admit.  Vancomycin 5/30>> Zosyn 5/30>>  5/30: Blood: 5/30: Urine: 5/30: Sputum: 5/30: MRSA pcr: pos  Goal of Therapy:  Vancomycin trough level 15-20 mcg/ml  Plan:  - aPTT in AM; will f/up for anticoagulation plans in setting of Afib and ARF (now that s/p 4h HD, ~ 68% Pradaxa removal) - Vancomycin 1gm IV today to replace losses via HD/UOP, then Vancomycin 1750mg  IV q 48h, next dose due on 6/2 @ 1200  - Continue Zosyn 2.25gm IV q 6h  - Will f/up to assess Css Vanc trough when appropriate  - Will monitor cx/spec/sens, renal fn/ further RRT and clinical status daily   Thanks, Elishia Kaczorowski K. Allena Katz, PharmD, BCPS.  Clinical Pharmacist Pager [phone]. 06/07/2012 11:51 AM

## 2012-06-07 NOTE — Progress Notes (Signed)
PULMONARY  / CRITICAL CARE MEDICINE  Name: Brett [name] MRN: [id number] DOB: 14-Jun-1945    ADMISSION DATE:  06/06/2012 CONSULTATION DATE:  06/06/2012  REFERRING MD :  EDP PRIMARY SERVICE: PCCM  CHIEF COMPLAINT:  Tired and SOB  BRIEF PATIENT DESCRIPTION: 67 year old male with history of HTN and a-fib who has not been feeling well for two weeks, starting having shaking chills on a nightly bases.  Four days ago started noticing diarrhea that was not bloody.  Not initiated by any particular event.  Patient continued to take his ACE and pradaxa.  Presents to the ED with BP in the 60's after 3 liters of fluid.  PCCM called to admit as a code sepsis.  SIGNIFICANT EVENTS / STUDIES:  5/30 Septic shock, admit to ICU.  LINES / TUBES: L [place] TLC 5/30>>> R a-line 5/30>>>  CULTURES: Blood 5/30>>> Urine 5/30>>> Sputum 5/30>>>  ANTIBIOTICS: Vanc 5/30>>> Zosyn 5/30>>>  SUBJECTIVE: Tired, diarrhea.  VITAL SIGNS: Temp:  [99 F (37.2 C)-103.5 F (39.7 C)] 103.5 F (39.7 C) (05/31 0927) Pulse Rate:  [33-161] 145 (05/31 0927) Resp:  [24-38] 29 (05/31 0927) BP: (80-149)/(35-103) 123/59 mmHg (05/31 0927) SpO2:  [96 %-100 %] 100 % (05/31 0927) Arterial Line BP: (109-192)/(36-77) 170/58 mmHg (05/31 0913) FiO2 (%):  [28 %] 28 % (05/30 1319) Weight:  [126.7 kg (279 lb 5.2 oz)-131.543 kg (290 lb)] 126.7 kg (279 lb 5.2 oz) (05/31 0430) HEMODYNAMICS: CVP:  [8 mmHg-10 mmHg] 8 mmHg VENTILATOR SETTINGS: Vent Mode:  [-]  FiO2 (%):  [28 %] 28 % INTAKE / OUTPUT: Intake/Output     05/30 0701 - 05/31 0700 05/31 0701 - 06/01 0700   I.V. (mL/kg) 3715 (29.3) 75 (0.6)   Blood 2505.8 400   Other 20    IV Piggyback 200    Total Intake(mL/kg) 6440.8 (50.8) 475 (3.7)   Urine (mL/kg/hr) 1825    Total Output 1825 0   Net +4615.8 +475         PHYSICAL EXAMINATION: General:  Chronically ill appearing obese male in respiratory distress. Neuro:  Lethargic but alert and interactive, follows command and  moves all ext to command. HEENT:  [place]/AT, PERRL, EOM-I and DMM. Cardiovascular:  IRIR, Nl S1/S2, -M/R/G. Lungs:  CTA bilaterally. Abdomen:  Soft, NT, ND and +BS. Musculoskeletal:  -edema and -tenderness. Skin:  Both lower ext with multiple lesions.  LABS:  Recent Labs Lab 06/06/12 1122  06/06/12 1150 06/06/12 1319 06/06/12 1359 06/06/12 2233 06/06/12 2241 06/06/12 2243 06/07/12 0138 06/07/12 0350 06/07/12 0354 06/07/12 0430  HGB 12.4*  --   --  9.4*  --  8.3*  --   --   --   --  7.4*  --   WBC 4.7  --   --  3.6*  --  3.3*  --   --   --   --  2.8*  --   PLT 51*  --   --  44*  --  36*  --  36*  --   --  34*  --   NA 125*  --   --  128*  --  134*  --   --   --   --  134* 135  K 3.9  --   --  3.4*  --  3.6  --   --   --   --  3.1* 3.2*  CL 88*  --   --  98  --  96  --   --   --   --  95* 97  CO2 13*  --   --  10*  --  16*  --   --   --   --  20 20  GLUCOSE 153*  --   --  132*  --  192*  --   --   --   --  172* 161*  BUN 124*  --   --  117*  --  124*  --   --   --   --  130* 129*  CREATININE 11.11*  --   --  10.03*  --  9.60*  --   --   --   --  9.46* 9.46*  CALCIUM 8.1*  --   --  6.6*  --  7.2*  --   --   --   --  7.1* 7.1*  MG  --   --   --   --   --  2.0  --   --   --   --  1.9  --   PHOS  --   --   --   --   --   --   --   --   --   --  5.5* 5.5*  AST 178*  --   --  142*  --  144*  --   --   --   --   --   --   ALT 93*  --   --  71*  --  71*  --   --   --   --   --   --   ALKPHOS 51  --   --  38*  --  45  --   --   --   --   --   --   BILITOT 0.9  --   --  0.7  --  0.6  --   --   --   --   --   --   PROT 7.1  --   --  5.2*  --  5.6*  --   --   --   --   --   --   ALBUMIN 2.9*  --   --  2.2*  --  2.4*  --   --   --   --   --  2.3*  APTT  --   --   --  85*  --   --   --  83*  --   --   --   --   INR  --   --   --  4.99*  --   --   --  3.80*  --   --   --   --   LATICACIDVEN  --   --  1.90 0.9  --   --   --   --   --  1.6  --   --   TROPONINI  --   < >  --  <0.30  --   --   <0.30  --   --   --  <0.30  --   PROCALCITON  --   --   --  9.19  --   --   --   --   --   --   --   --   PHART  --   --   --   --  7.295*  --   --   --  7.416  --   --   --  PCO2ART  --   --   --   --  21.6*  --   --   --  28.6*  --   --   --   PO2ART  --   --   --   --  116.0*  --   --   --  77.0*  --   --   --   < > = values in this interval not displayed.  Recent Labs Lab 06/06/12 1733 06/06/12 2028 06/07/12 0014 06/07/12 0402 06/07/12 0734  GLUCAP 105* 115* 163* 149* 125*   CXR: Low lung volumes but no acute findings.  ASSESSMENT / PLAN:  PULMONARY A: Increase RR to compensate for metabolic acidosis but able to protect his airway.  OSA by history.  Respiratory failure due to poor mental status. P:   - Intubate, mechanically ventilate. - F/U CXR and ABG.  CARDIOVASCULAR A: Shock likely due to hypovolemia.  I see no indication of infection at this time other than possibly the legs with cellulitis.  A-fib with RVR and HTN. P:  - See ID section. - CVPs as ordered. - Volume resuscitation. - 2D echo pending. - Cardizem drip as ordered.  RENAL A:  ARF, no history of renal disease.  Likely a combination of dehydration and ACE use.  No indication for acute dialysis. P:   - Hydration. - Monitor lytes closely. - Renal consult appreciated, HD done today.  GASTROINTESTINAL A:  Diarrhea. P:   - Stool culture pending. - Start TF.  HEMATOLOGIC A:  Thrombocytopenia of unknown cause.  ?TTP, ?HSP, ?E. coli. P:  - Peripheral smear per H/O. - Stool culture. - Abx. - Plasmapheresis again today.  INFECTIOUS A:  Likely bowel or cellulitis. P:   - Cultures as above. - Abx as above.  ENDOCRINE A:  ?DM, none by history.   P:   - ISS.  NEUROLOGIC A: Confused.  Non-focal.  ?uremia. P:   - Head CT without contrast negative. - Intermittent sedation post intubation.  TODAY'S SUMMARY: AMS today with upper airway obstruction, will intubate while TTP takes effect and HD  addresses uremia.  Abx for infection.  I have personally obtained a history, examined the patient, evaluated laboratory and imaging results, formulated the assessment and plan and placed orders.  CRITICAL CARE: The patient is critically ill with multiple organ systems failure and requires high complexity decision making for assessment and support, frequent evaluation and titration of therapies, application of advanced monitoring technologies and extensive interpretation of multiple databases. Critical Care Time devoted to patient care services described in this note is 35 minutes.   Alyson Reedy, M.D. Pulmonary and Critical Care Medicine [facility] Pager: [phone]  06/07/2012, 10:48 AM

## 2012-06-07 NOTE — Progress Notes (Signed)
[facility]  Subjective:  Opens eyes, on nasal CPAP, still shivering, currently on dialysis (started 4:38 AM)   Objective: Vital signs in last 24 hours: Blood pressure 142/66, pulse 131, temperature 101.3 F (38.5 C), temperature source Core (Comment), resp. rate 27, height 5\' 11"  (1.803 m), weight 126.7 kg (279 lb 5.2 oz), SpO2 100.00%.    PHYSICAL EXAM General--as above Chest--oozing from L subclavian Heart--no rub Abd--nontender Extr--1+ edema and erythema, HD cath in L fem vein,  Fem stop had been on R fem area (off now)  Lab Results:   Recent Labs Lab 06/06/12 2233 06/07/12 0354 06/07/12 0430  NA 134* 134* 135  K 3.6 3.1* 3.2*  CL 96 95* 97  CO2 16* 20 20  BUN 124* 130* 129*  CREATININE 9.60* 9.46* 9.46*  GLUCOSE 192* 172* 161*  CALCIUM 7.2* 7.1* 7.1*  PHOS  --  5.5* 5.5*     Recent Labs  06/06/12 2233 06/06/12 2243 06/07/12 0354  WBC 3.3*  --  2.8*  HGB 8.3*  --  7.4*  HCT 23.2*  --  20.7*  PLT 36* 36* 34*     I have reviewed the patient's current medications. Scheduled: . sodium chloride   Intravenous STAT  . anticoagulant sodium citrate  5 mL Intracatheter Once  . calcium carbonate  2 tablet Oral Q3H  . calcium gluconate IVPB  4 g Intravenous Once  . hydrocortisone sodium succinate  50 mg Intravenous Q6H  . insulin aspart  2-6 Units Subcutaneous Q4H  . methylPREDNISolone (SOLU-MEDROL) injection  80 mg Intravenous Once  . norepinephrine (LEVOPHED) Adult infusion  2-50 mcg/min Intravenous Once  . piperacillin-tazobactam (ZOSYN)  IV  2.25 g Intravenous Q6H  . [START ON 06/08/2012] vancomycin  1,750 mg Intravenous Q48H   Continuous: . sodium chloride 1,000 mL (06/06/12 1747)  . citrate dextrose    . norepinephrine (LEVOPHED) Adult infusion Stopped (06/06/12 1330)  .  sodium bicarbonate  infusion 1000 mL 75 mL/hr at 06/07/12 0616    Assessment/Plan: 1. Hypotension--BP better--on no pressors. IV bicarb now at 75 cc/hr.  Continues  on vanco and zosyn 2. Acute renal failure (creatinine 1.0 March 2014)-renal ultrasound--R 14.6 cm, L 15.9 cm, no hydro.  Getting dialysis to reverse effects of pradaxa.  Keep even.  1825 cc urine out since admission! 3. Metabolic acidosis--lactate not elevated.  ABG this AM pH 7.42, pO2 77, pCO2 29 on RA.  IV bicarb now at 75 cc/hr 4.  Diarrhea--None yet today. Will send stool for culture and C. difficile. He has a well at home. May need to check well if he grows enteric pathogen 5. Atrial fibrillation--had been on Pradaxa and dronedarone PTA--on none now.  Prn B blocker for RVR 6. Thrombocytopenia--being treated for presumptive TTP.   To get PLEX once dialysis finishes.  To receive 2 U PRBC in dialysis for hgb 7.4 this AM 7. Coagulopathy--had been on Pradaxa. Dialysis now to reverse pradaxa       LOS: 1 day   Tagan Bartram F 06/07/2012,7:43 AM   .labalb

## 2012-06-07 NOTE — Progress Notes (Signed)
INITIAL NUTRITION ASSESSMENT  DOCUMENTATION CODES Per approved criteria  -Obesity Unspecified   INTERVENTION: 1. Initiate Vital 1.5 @ 20 ml/hr via OG and increase by 10 ml every 4 hours to goal rate of 45 ml/hr. 60 ml Prostat TID.  At goal rate, tube feeding regimen will provide 1752 kcal, 155 grams of protein, and 733 ml of H2O.    NUTRITION DIAGNOSIS: Inadequate oral intake related to inability to eat as evidenced by NPO.   Goal: Enteral nutrition to provide 60-70% of estimated calorie needs (22-25 kcals/kg ideal body weight) and 100% of estimated protein needs, based on ASPEN guidelines for permissive underfeeding in critically ill obese individuals.   Monitor:  Vent status, TF tolerance, weight trends, labs  Reason for Assessment: Consult- initiation and management of enteral nutrition  67 y.o. male  Admitting Dx: Sepsis  ASSESSMENT: Pt admitted from home on 5/30 with several days diarrhea, weakness, chills, SOB. Code sepsis, unclear etiology, possible Pradaxa toxicity, GI infection, TTP? Pt started on HD/plasmaphoresis to remove Pradaxa. Pt protecting airway on admission but now has required intubation with poor mental status.  RD consulted for initiation and management of EN. RD will utilize an elemental formula given recent diarrhea. Pt has OG tube with tip placement confirmed in proximal stomach.   Height: Ht Readings from Last 1 Encounters:  06/06/12 5\' 11"  (1.803 m)    Weight: Wt Readings from Last 1 Encounters:  06/07/12 279 lb 5.2 oz (126.7 kg)    Ideal Body Weight: 172 lbs   % Ideal Body Weight: 162%  Wt Readings from Last 10 Encounters:  06/07/12 279 lb 5.2 oz (126.7 kg)  06/08/11 290 lb (131.543 kg)  06/08/11 290 lb (131.543 kg)    Usual Body Weight: 290 lbs   % Usual Body Weight: 96%  BMI:  Body mass index is 38.97 kg/(m^2). Obesity class 2  Patient is currently intubated on ventilator support.  MV: 14.9 Temp:Temp (24hrs), Avg:102.6 F (39.2  C), Min:101.3 F (38.5 C), Max:103.5 F (39.7 C)  Propofol: none   Estimated Nutritional Needs: Kcal: 2705 Underfeeding Goal: 1610-9604 kcal Protein: >/= 156 gm  Fluid: per MD   Skin: intact   Diet Order: NPO  EDUCATION NEEDS: -No education needs identified at this time   Intake/Output Summary (Last 24 hours) at 06/07/12 1245 Last data filed at 06/07/12 0927  Gross per 24 hour  Intake 6915.75 ml  Output   1825 ml  Net 5090.75 ml    Last BM: none yet this admission    Labs:   Recent Labs Lab 06/06/12 1319 06/06/12 2233 06/07/12 0354 06/07/12 0430  NA 128* 134* 134* 135  K 3.4* 3.6 3.1* 3.2*  CL 98 96 95* 97  CO2 10* 16* 20 20  BUN 117* 124* 130* 129*  CREATININE 10.03* 9.60* 9.46* 9.46*  CALCIUM 6.6* 7.2* 7.1* 7.1*  MG  --  2.0 1.9  --   PHOS  --   --  5.5* 5.5*  GLUCOSE 132* 192* 172* 161*    CBG (last 3)   Recent Labs  06/07/12 0402 06/07/12 0734 06/07/12 1120  GLUCAP 149* 125* 120*    Scheduled Meds: . sodium chloride   Intravenous STAT  . anticoagulant sodium citrate  5 mL Intracatheter Once  . antiseptic oral rinse  15 mL Mouth Rinse QID  . calcium carbonate  2 tablet Oral Q3H  . calcium gluconate IVPB  4 g Intravenous Once  . chlorhexidine  15 mL Mouth Rinse BID  .  Chlorhexidine Gluconate Cloth  6 each Topical Q0600  . hydrocortisone sodium succinate  50 mg Intravenous Q6H  . insulin aspart  2-6 Units Subcutaneous Q4H  . mupirocin ointment  1 application Nasal BID  . norepinephrine (LEVOPHED) Adult infusion  2-50 mcg/min Intravenous Once  . piperacillin-tazobactam (ZOSYN)  IV  2.25 g Intravenous Q6H  . [START ON 06/09/2012] vancomycin  1,750 mg Intravenous Q48H  . vancomycin  1,000 mg Intravenous Once    Continuous Infusions: . sodium chloride 1,000 mL (06/06/12 1747)  . citrate dextrose    . diltiazem (CARDIZEM) infusion 5 mg/hr (06/07/12 1213)  . norepinephrine (LEVOPHED) Adult infusion Stopped (06/06/12 1330)  .  sodium  bicarbonate  infusion 1000 mL 75 mL/hr at 06/07/12 1610    Past Medical History  Diagnosis Date  . Persistent atrial fibrillation 02/2011    Negative Myoview; Normal EF by Echo  . Obesity (BMI 30-39.9)   . Hypertension   . Dyslipidemia     Past Surgical History  Procedure Laterality Date  . Joint replacement    . Cardioversion  06/08/2011    Procedure: CARDIOVERSION;  Surgeon: Marykay Lex, MD;  Location: [facility] OR;  Service: Cardiovascular;  Laterality: N/A;    Clarene Duke RD, LDN Pager [phone] After Hours pager [phone]

## 2012-06-07 NOTE — Significant Event (Signed)
CRITICAL VALUE ALERT  Critical value received:  + blood cultures; gram postivie cocci in clusters in anaerobic bottle  Date of notification:  06-07-12  Time of notification: 1246  Critical value read back: yes  Nurse who received alert: Georgette Dover, RN/Katrice Rulon Eisenmenger  MD notified (1st page):Dr Molli Knock   Time of first page:  1247  Responding MD:  Dr Molli Knock  Time MD responded: 1248

## 2012-06-07 NOTE — Progress Notes (Signed)
Case reviewed on behalf of Dr Molli Knock  - several features for [facility] Spoitted fever present  - barn work  - early summer  - rash in Lower extremity  - CNS changes  - low platelet and low Na at admission  PLAN  - strt doxy IV 100mg  q12h - diagnostic titers IgM and IgG check - would recommend skin biopsy   Dr. Kalman Shan, M.D., [facility].C.P Pulmonary and Critical Care Medicine Staff Physician [facility] [place] Pager: [phone], If no answer or between  15:00h - 7:00h: call [phone]  06/07/2012 9:24 PM

## 2012-06-07 NOTE — Progress Notes (Signed)
Point of Contact Note:  Femostop used to maintain hemostasis on right femoral site, intact pulses, no signs of skin compromised during use with close observation.  Removed and still not bleeding overtly.  Femostop still ready to be deployed but will hold currently, but with low threshold to re-apply.  Nursing staff to check q30 minutes for hematoma formation at site with instructions to alert staff if further bleeding observed.  Leontine Locket, MD CCM

## 2012-06-08 ENCOUNTER — Other Ambulatory Visit (HOSPITAL_COMMUNITY): Payer: Self-pay | Admitting: Oncology

## 2012-06-08 ENCOUNTER — Inpatient Hospital Stay (HOSPITAL_COMMUNITY): Payer: Medicare Other

## 2012-06-08 DIAGNOSIS — D696 Thrombocytopenia, unspecified: Secondary | ICD-10-CM

## 2012-06-08 DIAGNOSIS — D689 Coagulation defect, unspecified: Secondary | ICD-10-CM

## 2012-06-08 LAB — APTT
aPTT: 57 seconds — ABNORMAL HIGH (ref 24–37)
aPTT: 50 seconds — ABNORMAL HIGH (ref 24–37)

## 2012-06-08 LAB — THERAPEUTIC PLASMA EXCHANGE (BLOOD BANK)
Plasma Exchange: 4253
Unit division: 0
Unit division: 0
Unit division: 0
Unit division: 0

## 2012-06-08 LAB — BASIC METABOLIC PANEL WITH GFR
BUN: 80 mg/dL — ABNORMAL HIGH (ref 6–23)
CO2: 34 meq/L — ABNORMAL HIGH (ref 19–32)
Calcium: 9 mg/dL (ref 8.4–10.5)
Chloride: 97 meq/L (ref 96–112)
Creatinine, Ser: 3.64 mg/dL — ABNORMAL HIGH (ref 0.50–1.35)
GFR calc Af Amer: 18 mL/min — ABNORMAL LOW
GFR calc non Af Amer: 16 mL/min — ABNORMAL LOW
Glucose, Bld: 256 mg/dL — ABNORMAL HIGH (ref 70–99)
Potassium: 2.8 meq/L — ABNORMAL LOW (ref 3.5–5.1)
Sodium: 143 meq/L (ref 135–145)

## 2012-06-08 LAB — CBC
Hemoglobin: 5.6 g/dL — CL (ref 13.0–17.0)
MCH: 26.7 pg (ref 26.0–34.0)
MCHC: 34.4 g/dL (ref 30.0–36.0)
RDW: 15.1 % (ref 11.5–15.5)

## 2012-06-08 LAB — PREPARE FRESH FROZEN PLASMA
Unit division: 0
Unit division: 0
Unit division: 0
Unit division: 0
Unit division: 0

## 2012-06-08 LAB — CBC WITH DIFFERENTIAL/PLATELET
Band Neutrophils: 0 % (ref 0–10)
Basophils Absolute: 0 10*3/uL (ref 0.0–0.1)
Basophils Relative: 0 % (ref 0–1)
HCT: 19.7 % — ABNORMAL LOW (ref 39.0–52.0)
Hemoglobin: 6.7 g/dL — CL (ref 13.0–17.0)
Lymphocytes Relative: 29 % (ref 12–46)
Lymphs Abs: 1.2 10*3/uL (ref 0.7–4.0)
MCHC: 34 g/dL (ref 30.0–36.0)
MCV: 78.8 fL (ref 78.0–100.0)
Metamyelocytes Relative: 0 %
Monocytes Absolute: 0.2 10*3/uL (ref 0.1–1.0)
Promyelocytes Absolute: 0 %

## 2012-06-08 LAB — PREPARE PLATELET PHERESIS: Unit division: 0

## 2012-06-08 LAB — BASIC METABOLIC PANEL
Calcium: 7.5 mg/dL — ABNORMAL LOW (ref 8.4–10.5)
GFR calc Af Amer: 13 mL/min — ABNORMAL LOW (ref >90)
GFR calc non Af Amer: 11 mL/min — ABNORMAL LOW (ref >90)
Glucose, Bld: 219 mg/dL — ABNORMAL HIGH (ref 70–99)
Potassium: 3 mEq/L — ABNORMAL LOW (ref 3.5–5.1)
Sodium: 138 mEq/L (ref 135–145)

## 2012-06-08 LAB — BLOOD GAS, ARTERIAL
Drawn by: 252031
FIO2: 0.5 %
MECHVT: 600 mL
RATE: 15 resp/min
pCO2 arterial: 39.8 mmHg (ref 35.0–45.0)
pH, Arterial: 7.488 — ABNORMAL HIGH (ref 7.350–7.450)
pO2, Arterial: 201 mmHg — ABNORMAL HIGH (ref 80.0–100.0)

## 2012-06-08 LAB — PHOSPHORUS: Phosphorus: 4.5 mg/dL (ref 2.3–4.6)

## 2012-06-08 LAB — GLUCOSE, CAPILLARY
Glucose-Capillary: 203 mg/dL — ABNORMAL HIGH (ref 70–99)
Glucose-Capillary: 243 mg/dL — ABNORMAL HIGH (ref 70–99)
Glucose-Capillary: 256 mg/dL — ABNORMAL HIGH (ref 70–99)
Glucose-Capillary: 243 mg/dL — ABNORMAL HIGH (ref 70–99)
Glucose-Capillary: 205 mg/dL — ABNORMAL HIGH (ref 70–99)
Glucose-Capillary: 209 mg/dL — ABNORMAL HIGH (ref 70–99)

## 2012-06-08 MED ORDER — POTASSIUM CHLORIDE 10 MEQ/50ML IV SOLN
10.0000 meq | INTRAVENOUS | Status: AC
  Administered 2012-06-08 – 2012-06-09 (×3): 10 meq via INTRAVENOUS
  Filled 2012-06-08: qty 100
  Filled 2012-06-08: qty 50

## 2012-06-08 MED ORDER — DIPHENHYDRAMINE HCL 50 MG/ML IJ SOLN
INTRAMUSCULAR | Status: AC
  Administered 2012-06-08: 25 mg via INTRAVENOUS
  Filled 2012-06-08: qty 1

## 2012-06-08 MED ORDER — CALCIUM CARBONATE ANTACID 500 MG PO CHEW
2.0000 | CHEWABLE_TABLET | ORAL | Status: DC
  Filled 2012-06-08 (×2): qty 2

## 2012-06-08 MED ORDER — DIPHENHYDRAMINE HCL 25 MG PO CAPS: 25.0000 mg | ORAL_CAPSULE | Freq: Four times a day (QID) | ORAL | Status: DC | PRN

## 2012-06-08 MED ORDER — SODIUM CHLORIDE 0.9 % IV SOLN
INTRAVENOUS | Status: DC
  Administered 2012-06-08: 2 [IU]/h via INTRAVENOUS
  Administered 2012-06-09: 6.6 [IU]/h via INTRAVENOUS
  Filled 2012-06-08 (×2): qty 1

## 2012-06-08 MED ORDER — "THROMBI-PAD 3""X3"" EX PADS"
1.0000 | MEDICATED_PAD | Freq: Once | CUTANEOUS | Status: AC
  Administered 2012-06-08: 1 via TOPICAL
  Filled 2012-06-08: qty 1

## 2012-06-08 MED ORDER — PANTOPRAZOLE SODIUM 40 MG IV SOLR
40.0000 mg | INTRAVENOUS | Status: DC
  Administered 2012-06-08: 40 mg via INTRAVENOUS
  Filled 2012-06-08 (×2): qty 40

## 2012-06-08 MED ORDER — "THROMBI-PAD 3""X3"" EX PADS"
1.0000 | MEDICATED_PAD | Freq: Once | CUTANEOUS | Status: AC
  Administered 2012-06-08: 1 via TOPICAL
  Filled 2012-06-08 (×2): qty 1

## 2012-06-08 MED ORDER — ACETAMINOPHEN 325 MG PO TABS
650.0000 mg | ORAL_TABLET | ORAL | Status: DC | PRN
  Filled 2012-06-08: qty 2

## 2012-06-08 MED ORDER — POTASSIUM CHLORIDE 10 MEQ/50ML IV SOLN
10.0000 meq | INTRAVENOUS | Status: AC
  Administered 2012-06-08 (×4): 10 meq via INTRAVENOUS
  Filled 2012-06-08: qty 200

## 2012-06-08 MED ORDER — DIPHENHYDRAMINE HCL 50 MG/ML IJ SOLN
25.0000 mg | Freq: Four times a day (QID) | INTRAMUSCULAR | Status: DC | PRN
  Administered 2012-06-09 – 2012-06-13 (×4): 25 mg via INTRAVENOUS
  Filled 2012-06-08 (×5): qty 1

## 2012-06-08 MED ORDER — MAGNESIUM SULFATE 40 MG/ML IJ SOLN
2.0000 g | Freq: Once | INTRAMUSCULAR | Status: AC
  Administered 2012-06-08: 2 g via INTRAVENOUS
  Filled 2012-06-08: qty 50

## 2012-06-08 MED ORDER — POTASSIUM CHLORIDE 10 MEQ/50ML IV SOLN
INTRAVENOUS | Status: AC
  Administered 2012-06-08: 10 meq via INTRAVENOUS
  Filled 2012-06-08: qty 50

## 2012-06-08 MED ORDER — ACD FORMULA A 0.73-2.45-2.2 GM/100ML VI SOLN
500.0000 mL | Status: DC
  Filled 2012-06-08: qty 500

## 2012-06-08 MED ORDER — SODIUM CHLORIDE 0.9 % IV SOLN
4.0000 g | Freq: Once | INTRAVENOUS | Status: AC
  Administered 2012-06-08: 4 g via INTRAVENOUS
  Filled 2012-06-08 (×2): qty 40

## 2012-06-08 MED ORDER — DIPHENHYDRAMINE HCL 50 MG/ML IJ SOLN
INTRAMUSCULAR | Status: AC
  Filled 2012-06-08: qty 1

## 2012-06-08 MED ORDER — ANTICOAGULANT SODIUM CITRATE 4% (200MG/5ML) IV SOLN
5.0000 mL | Freq: Once | Status: AC
  Administered 2012-06-08: 5 mL
  Filled 2012-06-08: qty 250

## 2012-06-08 MED ORDER — DIPHENHYDRAMINE HCL 50 MG/ML IJ SOLN
25.0000 mg | Freq: Once | INTRAMUSCULAR | Status: AC
  Administered 2012-06-08: 25 mg via INTRAVENOUS

## 2012-06-08 NOTE — Progress Notes (Signed)
CRITICAL VALUE ALERT  Critical value received:  Hgb=5.6  Date of notification:  06/08/12  Time of notification:  0412  Critical value read back:yes  Nurse who received alert:  Dustin Flock RN  MD notified (1st page):  Dr Deterding  Time of first page:  [phone]  MD notified (2nd page):  Time of second page:  Responding MD:  DR Deterding  Time MD responded:  [phone]

## 2012-06-08 NOTE — Progress Notes (Signed)
eLink Physician-Brief Progress Note Patient Name: Brett [name] DOB: 05-31-1945 MRN: [id number]  Date of Service  06/08/2012   HPI/Events of Note  Continues to ooze from CVL site.  Multiple dressing changes, pressure dressing, etc.   eICU Interventions  Plan: Thrombin pad to CVL site.   Intervention Category Intermediate Interventions: Bleeding - evaluation and treatment with blood products  DETERDING,ELIZABETH 06/08/2012, 12:09 AM

## 2012-06-08 NOTE — Progress Notes (Signed)
eLink Physician-Brief Progress Note Patient Name: Brett [name] DOB: [birth year] MRN: [id number]  Date of Service  06/08/2012   HPI/Events of Note  Continued oozing from CVL site.  Now with Hgb of 5.6 and plts of 34.   eICU Interventions  Plan: Transfuse 2 units of pRBC Nurse to contact hematology re use of plt txf in setting of TTP   Intervention Category Intermediate Interventions: Bleeding - evaluation and treatment with blood products  DETERDING,ELIZABETH 06/08/2012, 4:19 AM

## 2012-06-08 NOTE — Progress Notes (Signed)
[facility]  Subjective:  Intubated, sedated, not shivering any more oozing stopped from R fem site, but continues from L subclav On NE 8 mcg/min   Objective: Vital signs in last 24 hours: Blood pressure 124/65, pulse 96, temperature 99.1 F (37.3 C), temperature source Core (Comment), resp. rate 21, height 5\' 11"  (1.803 m), weight 137.4 kg (302 lb 14.6 oz), SpO2 100.00%.    PHYSICAL EXAM General--as above  Chest--oozing blood L subclavian, rhonchi Heart--no rub Abd--nontender Extr--trace edema, no further R fem bleeding and no bleeding around L fem vein cath  I/O yesterday  4882/1895 [phone] as urine); 300 cc out so far today    Lab Results:   Recent Labs Lab 06/07/12 0354 06/07/12 0430 06/08/12 0353  NA 134* 135 138  K 3.1* 3.2* 3.0*  CL 95* 97 94*  CO2 20 20 30   BUN 130* 129* 85*  CREATININE 9.46* 9.46* 4.83*  GLUCOSE 172* 161* 219*  CALCIUM 7.1* 7.1* 7.5*  PHOS 5.5* 5.5* 4.5     Recent Labs  06/07/12 1525 06/08/12 0353  WBC 2.2* 2.3*  HGB 7.3* 5.6*  HCT 20.7* 16.3*  PLT 35* 32*     I have reviewed the patient's current medications. Scheduled: . antiseptic oral rinse  15 mL Mouth Rinse QID  . chlorhexidine  15 mL Mouth Rinse BID  . Chlorhexidine Gluconate Cloth  6 each Topical Q0600  . doxycycline (VIBRAMYCIN) IV  100 mg Intravenous Q12H  . feeding supplement  60 mL Per Tube TID  . hydrocortisone sodium succinate  50 mg Intravenous Q6H  . insulin aspart  2-6 Units Subcutaneous Q4H  . mupirocin ointment  1 application Nasal BID  . norepinephrine (LEVOPHED) Adult infusion  2-50 mcg/min Intravenous Once  . piperacillin-tazobactam (ZOSYN)  IV  2.25 g Intravenous Q6H  . THROMBI-PAD  1 each Topical Once  . [START ON 06/09/2012] vancomycin  1,750 mg Intravenous Q48H   Continuous: . sodium chloride 1,000 mL (06/06/12 1747)  . citrate dextrose    . diltiazem (CARDIZEM) infusion 10 mg/hr (06/08/12 0610)  . feeding supplement (VITAL 1.5 CAL)  1,000 mL (06/07/12 2019)  . fentaNYL infusion INTRAVENOUS 75 mcg/hr (06/08/12 0600)  . norepinephrine (LEVOPHED) Adult infusion 8 mcg/min (06/08/12 0600)  .  sodium bicarbonate  infusion 1000 mL 75 mL/hr at 06/07/12 0958    Assessment/Plan: 1. Hypotension--BP on  pressors. IV bicarb d/c'd. Continues on vanco and zosyn 2. Acute renal failure (creatinine 1.0 March 2014)-renal ultrasound--R 14.6 cm, L 15.9 cm, no hydro. Got  dialysis yestreday to reverse effects of pradaxa. Keep even. Remains nonoliguric. BUN 85, Cr 4.8 today. K 3.0 (Rx KCl) Check K 4 PM 3. Metabolic acidosis--bicareb 30 today.  IVbicarb d/c'd 4. ? Sepsis--cultures neg so far.  On doxy, zosyn, vanco + NE 5. Diarrhea--None yet today. Will send stool for culture and C. difficile. He has a well at home. May need to check well if he grows enteric pathogen 6. Atrial fibrillation--had been on Pradaxa and dronedarone PTA--on none now. Prn B blocker for RVR 7. Thrombocytopenia--being treated for presumptive TTP. To get PLEX again today. 8. Coagulopathy--had been on Pradaxa. Dialysis yesterday to reverse pradaxa.  Plts today 32 K, PT 21 sec, INR 1.9 last PM, PTT 57 sec today       LOS: 2 days   Ryelle Ruvalcaba F 06/08/2012,8:46 AM   .labalb

## 2012-06-08 NOTE — Progress Notes (Addendum)
PULMONARY  / CRITICAL CARE MEDICINE  Name: Brett [name] MRN: [id number] DOB: [birth year]    ADMISSION DATE:  06/06/2012 CONSULTATION DATE:  06/06/2012  REFERRING MD :  EDP PRIMARY SERVICE: PCCM  CHIEF COMPLAINT:  Tired and SOB  BRIEF PATIENT DESCRIPTION: 67 year old male with history of HTN and a-fib who has not been feeling well for two weeks, starting having shaking chills on a nightly bases.  Four days ago started noticing diarrhea that was not bloody.  Not initiated by any particular event.  Patient continued to take his ACE and pradaxa.  Presents to the ED with BP in the 60's after 3 liters of fluid.  PCCM called to admit as a code sepsis.  SIGNIFICANT EVENTS / STUDIES:  5/30 Septic shock, admit to ICU.  LINES / TUBES: L [place] TLC 5/30>>> R a-line 5/30>>> ET tube 5/31>>> L femoral trialysis catheter 5/30>>>  CULTURES: Blood 5/30>>>Coag neg staph Urine 5/30>>> Sputum 5/30>>>Mold  ANTIBIOTICS: Vanc 5/30>>> Zosyn 5/30>>> Doxycycline 5/31>>>  SUBJECTIVE: Tired, diarrhea.  VITAL SIGNS: Temp:  [99 F (37.2 C)-103.8 F (39.9 C)] 99 F (37.2 C) (06/01 0845) Pulse Rate:  [67-141] 96 (06/01 0845) Resp:  [17-30] 17 (06/01 0845) BP: (65-171)/(16-92) 124/65 mmHg (06/01 0742) SpO2:  [100 %] 100 % (06/01 0742) Arterial Line BP: (83-159)/(45-81) 135/69 mmHg (06/01 0845) FiO2 (%):  [40 %-100 %] 40 % (06/01 0800) Weight:  [137.4 kg (302 lb 14.6 oz)] 137.4 kg (302 lb 14.6 oz) (06/01 0400) HEMODYNAMICS: CVP:  [8 mmHg-11 mmHg] 11 mmHg VENTILATOR SETTINGS: Vent Mode:  [-] PRVC FiO2 (%):  [40 %-100 %] 40 % Set Rate:  [15 bmp] 15 bmp Vt Set:  [600 mL] 600 mL PEEP:  [5 cmH20] 5 cmH20 Plateau Pressure:  [17 cmH20-19 cmH20] 17 cmH20 INTAKE / OUTPUT: Intake/Output     05/31 0701 - 06/01 0700 06/01 0701 - 06/02 0700   I.V. (mL/kg) 2130 (15.5) 375 (2.7)   Blood 1652.2 347.5   Other 30    NG/GT 470 80   IV Piggyback 600    Total Intake(mL/kg) 4882.2 (35.5) 802.5 (5.8)   Urine  (mL/kg/hr) 1695 (0.5) 300 (0.6)   Stool 200 (0.1)    Total Output 1895 300   Net +2987.2 +502.5         PHYSICAL EXAMINATION: General: Chronically ill appearing obese male, sedated and intubated. Neuro: Sedate but withdraws to pain. HEENT:  [place]/AT, PERRL, EOM-I and DMM. Cardiovascular:  IRIR, Nl S1/S2, -M/R/G. Lungs:  CTA bilaterally. Abdomen:  Soft, NT, ND and +BS. Musculoskeletal:  -edema and -tenderness. Skin:  Both lower ext with multiple lesions.  LABS:  Recent Labs Lab 06/06/12 1122  06/06/12 1150 06/06/12 1319  06/06/12 2233 06/06/12 2241  06/07/12 0138 06/07/12 0350 06/07/12 0354 06/07/12 0430 06/07/12 1041 06/07/12 1045 06/07/12 1149 06/07/12 1525 06/08/12 0351 06/08/12 0353 06/08/12 0900  HGB 12.4*  --   --  9.4*  --  8.3*  --   --   --   --  7.4*  --   --  9.4*  --  7.3*  --  5.6*  --   WBC 4.7  --   --  3.6*  --  3.3*  --   --   --   --  2.8*  --   --  4.3  --  2.2*  --  2.3*  --   PLT 51*  --   --  44*  --  36*  --   < >  --   --  34*  --   --  44*  --  35*  --  32*  --   NA 125*  --   --  128*  --  134*  --   --   --   --  134* 135  --   --   --   --   --  138  --   K 3.9  --   --  3.4*  --  3.6  --   --   --   --  3.1* 3.2*  --   --   --   --   --  3.0*  --   CL 88*  --   --  98  --  96  --   --   --   --  95* 97  --   --   --   --   --  94*  --   CO2 13*  --   --  10*  --  16*  --   --   --   --  20 20  --   --   --   --   --  30  --   GLUCOSE 153*  --   --  132*  --  192*  --   --   --   --  172* 161*  --   --   --   --   --  219*  --   BUN 124*  --   --  117*  --  124*  --   --   --   --  130* 129*  --   --   --   --   --  85*  --   CREATININE 11.11*  --   --  10.03*  --  9.60*  --   --   --   --  9.46* 9.46*  --   --   --   --   --  4.83*  --   CALCIUM 8.1*  --   --  6.6*  --  7.2*  --   --   --   --  7.1* 7.1*  --   --   --   --   --  7.5*  --   MG  --   --   --   --   --  2.0  --   --   --   --  1.9  --   --   --   --   --   --  1.9  --   PHOS  --    --   --   --   --   --   --   --   --   --  5.5* 5.5*  --   --   --   --   --  4.5  --   AST 178*  --   --  142*  --  144*  --   --   --   --   --   --   --   --   --   --   --   --   --   ALT 93*  --   --  71*  --  71*  --   --   --   --   --   --   --   --   --   --   --   --   --  ALKPHOS 51  --   --  38*  --  45  --   --   --   --   --   --   --   --   --   --   --   --   --   BILITOT 0.9  --   --  0.7  --  0.6  --   --   --   --   --   --   --   --   --   --   --   --   --   PROT 7.1  --   --  5.2*  --  5.6*  --   --   --   --   --   --   --   --   --   --   --   --   --   ALBUMIN 2.9*  --   --  2.2*  --  2.4*  --   --   --   --   --  2.3*  --   --   --   --   --   --   --   APTT  --   --   --  85*  --   --   --   < >  --   --   --   --   --  58*  --   --   --  57* 50*  INR  --   --   --  4.99*  --   --   --   < >  --   --   --   --   --  2.14*  --  1.93*  --   --  1.79*  LATICACIDVEN  --   --  1.90 0.9  --   --   --   --   --  1.6  --   --   --   --   --   --   --   --   --   TROPONINI  --   < >  --  <0.30  --   --  <0.30  --   --   --  <0.30  --  <0.30  --   --   --   --   --   --   PROCALCITON  --   --   --  9.19  --   --   --   --   --   --   --   --   --   --   --   --   --   --   --   PHART  --   --   --   --   < >  --   --   --  7.416  --   --   --   --   --  7.433  --  7.488*  --   --   PCO2ART  --   --   --   --   < >  --   --   --  28.6*  --   --   --   --   --  40.9  --  39.8  --   --   PO2ART  --   --   --   --   < >  --   --   --  77.0*  --   --   --   --   --  388.0*  --  201.0*  --   --   < > = values in this interval not displayed.  Recent Labs Lab 06/07/12 1548 06/07/12 1941 06/08/12 0008 06/08/12 0359 06/08/12 0724  GLUCAP 183* 175* 194* 203* 243*   CXR: Low lung volumes but no acute findings.  ASSESSMENT / PLAN:  PULMONARY A: Increase RR to compensate for metabolic acidosis but able to protect his airway.  OSA by history.  Respiratory failure due to poor mental  status. P:   - Maintain on full vent support. - F/U CXR and ABG. - Move ET tube down 4 cm.  CARDIOVASCULAR A: Shock likely due to hypovolemia.  I see no indication of infection at this time other than possibly the legs with cellulitis.  A-fib with RVR and HTN. P:  - See ID section. - CVPs as ordered. - KVO IVF. - 2D echo noted. - Cardizem drip as ordered. - Pressors for MAP of 65 mmHg.  RENAL A:  ARF, no history of renal disease.  Likely a combination of dehydration and ACE use.  No indication for acute dialysis. P:   - KVO IVF. - Monitor lytes closely. - Renal consult appreciated, HD per renal.  GASTROINTESTINAL A:  Diarrhea. P:   - Stool culture pending. - Continue TF.  HEMATOLOGIC A:  Thrombocytopenia of unknown cause.  ?TTP, ?HSP.  Hemorrhagic anemia today. P:  - Peripheral smear per H/O. - Stool culture pending. - Transfuse 2 units FFP and 2 units pRBC. - Plasmapheresis per H/O.  INFECTIOUS A:  Likely bowel or cellulitis. P:   - Cultures as above. - Abx as above.  ENDOCRINE A:  ?DM, none by history.   P:   - ISS.  NEUROLOGIC A: Confused.  Non-focal.  ?uremia. P:   - Head CT without contrast negative. - Intermittent sedation.  TODAY'S SUMMARY: AMS today with upper airway obstruction, will intubate while TTP takes effect and HD addresses uremia.  Abx for infection.  I have personally obtained a history, examined the patient, evaluated laboratory and imaging results, formulated the assessment and plan and placed orders.  CRITICAL CARE: The patient is critically ill with multiple organ systems failure and requires high complexity decision making for assessment and support, frequent evaluation and titration of therapies, application of advanced monitoring technologies and extensive interpretation of multiple databases. Critical Care Time devoted to patient care services described in this note is 35 minutes.   Alyson Reedy, M.D. Pulmonary and Critical  Care Medicine [facility] Pager: [phone]  06/08/2012, 10:30 AM

## 2012-06-08 NOTE — Progress Notes (Signed)
Brett [name]   DOB:1945-07-10   ZO#:[id number]   JWJ#:[id number]  Subjective: patient is intubated; however he nods when I speak to him; no family in room   Objective: middle-aged White male examined in bed Filed Vitals:   06/08/12 0742  BP: 124/65  Pulse: 96  Temp:   Resp: 21    Body mass index is 42.27 kg/(m^2).  Intake/Output Summary (Last 24 hours) at 06/08/12 0826 Last data filed at 06/08/12 0700  Gross per 24 hour  Intake 4807.16 ml  Output   1745 ml  Net 3062.16 ml     Lungs clear -- auscultated anterolaterally  Heart irregular rate, no murmur appreciated  Abdomen obese, +BS  Neuro : responds to voice (nodding appropriately)    CBG (last 3)   Recent Labs  06/08/12 0008 06/08/12 0359 06/08/12 0724  GLUCAP 194* 203* 243*     Labs:  Lab Results  Component Value Date   WBC 2.3* 06/08/2012   HGB 5.6* 06/08/2012   HCT 16.3* 06/08/2012   MCV 77.6* 06/08/2012   PLT 32* 06/08/2012    @LASTCHEMISTRY @  Urine Studies No results found for this basename: UACOL, UAPR, USPG, UPH, UTP, UGL, UKET, UBIL, UHGB, UNIT, UROB, ULEU, UEPI, UWBC, URBC, UBAC, CAST, CRYS, UCOM, BILUA,  in the last 72 hours  Basic Metabolic Panel:  Recent Labs Lab 06/06/12 1319 06/06/12 2233 06/07/12 0354 06/07/12 0430 06/08/12 0353  NA 128* 134* 134* 135 138  K 3.4* 3.6 3.1* 3.2* 3.0*  CL 98 96 95* 97 94*  CO2 10* 16* 20 20 30   GLUCOSE 132* 192* 172* 161* 219*  BUN 117* 124* 130* 129* 85*  CREATININE 10.03* 9.60* 9.46* 9.46* 4.83*  CALCIUM 6.6* 7.2* 7.1* 7.1* 7.5*  MG  --  2.0 1.9  --  1.9  PHOS  --   --  5.5* 5.5* 4.5   GFR Estimated Creatinine Clearance: 21 ml/min (by C-G formula based on Cr of 4.83). Liver Function Tests:  Recent Labs Lab 06/06/12 1122 06/06/12 1319 06/06/12 2233 06/07/12 0430  AST 178* 142* 144*  --   ALT 93* 71* 71*  --   ALKPHOS 51 38* 45  --   BILITOT 0.9 0.7 0.6  --   PROT 7.1 5.2* 5.6*  --   ALBUMIN 2.9* 2.2* 2.4* 2.3*   No results found for  this basename: LIPASE, AMYLASE,  in the last 168 hours No results found for this basename: AMMONIA,  in the last 168 hours Coagulation profile  Recent Labs Lab 06/06/12 1319 06/06/12 2243 06/07/12 1045 06/07/12 1525  INR 4.99* 3.80* 2.14* 1.93*    CBC:  Recent Labs Lab 06/06/12 2233 06/06/12 2243 06/07/12 0354 06/07/12 1045 06/07/12 1525 06/08/12 0353  WBC 3.3*  --  2.8* 4.3 2.2* 2.3*  HGB 8.3*  --  7.4* 9.4* 7.3* 5.6*  HCT 23.2*  --  20.7* 26.5* 20.7* 16.3*  MCV 74.4*  --  74.5* 75.7* 75.8* 77.6*  PLT 36* 36* 34* 44* 35* 32*   Cardiac Enzymes:  Recent Labs Lab 06/06/12 1132 06/06/12 1319 06/06/12 2241 06/07/12 0354 06/07/12 1041  TROPONINI <0.30 <0.30 <0.30 <0.30 <0.30   BNP: No components found with this basename: POCBNP,  CBG:  Recent Labs Lab 06/07/12 1548 06/07/12 1941 06/08/12 0008 06/08/12 0359 06/08/12 0724  GLUCAP 183* 175* 194* 203* 243*   D-Dimer  Recent Labs  06/06/12 2243  DDIMER 0.57*   Hgb A1c No results found for this basename: HGBA1C,  in the  last 72 hours Lipid Profile No results found for this basename: CHOL, HDL, LDLCALC, TRIG, CHOLHDL, LDLDIRECT,  in the last 72 hours Thyroid function studies No results found for this basename: TSH, T4TOTAL, FREET3, T3FREE, THYROIDAB,  in the last 72 hours Anemia work up No results found for this basename: VITAMINB12, FOLATE, FERRITIN, TIBC, IRON, RETICCTPCT,  in the last 72 hours Microbiology Recent Results (from the past 240 hour(s))  CULTURE, BLOOD (ROUTINE X 2)     Status: None   Collection Time    06/06/12 11:30 AM      Result Value Range Status   Specimen Description BLOOD RIGHT ANTECUBITAL   Final   Special Requests BOTTLES DRAWN AEROBIC AND ANAEROBIC 10CC   Final   Culture  Setup Time 06/06/2012 18:25   Final   Culture     Final   Value: GRAM POSITIVE COCCI IN CLUSTERS     Note: Gram Stain Report Called to,Read Back By and Verified With: SABO [facility] 06/07/12 @ 12:45PM BY RUSCA.    Report Status PENDING   Incomplete  CULTURE, BLOOD (ROUTINE X 2)     Status: None   Collection Time    06/06/12 11:45 AM      Result Value Range Status   Specimen Description BLOOD RIGHT ANTECUBITAL   Final   Special Requests BOTTLES DRAWN AEROBIC ONLY 5CC   Final   Culture  Setup Time 06/06/2012 18:25   Final   Culture     Final   Value: GRAM POSITIVE COCCI IN CLUSTERS     Note: Gram Stain Report Called to,Read Back By and Verified With: KATRICE KELLER 06/07/12 @ 5:38PM BY RUSCA.   Report Status PENDING   Incomplete  URINE CULTURE     Status: None   Collection Time    06/06/12 11:59 AM      Result Value Range Status   Specimen Description URINE, CATHETERIZED   Final   Special Requests NONE   Final   Culture  Setup Time 06/06/2012 19:34   Final   Colony Count NO GROWTH   Final   Culture NO GROWTH   Final   Report Status 06/07/2012 FINAL   Final  MRSA PCR SCREENING     Status: Abnormal   Collection Time    06/06/12  2:01 PM      Result Value Range Status   MRSA by PCR POSITIVE (*) NEGATIVE Final   Comment:            The GeneXpert MRSA Assay (FDA     approved for NASAL specimens     only), is one component of a     comprehensive MRSA colonization     surveillance program. It is not     intended to diagnose MRSA     infection nor to guide or     monitor treatment for     MRSA infections.     RESULT CALLED TO, READ BACK BY AND VERIFIED WITH:     R. FOUNTAIN RN 16:45 06/06/12 ([email or web address])  URINE CULTURE     Status: None   Collection Time    06/06/12  4:59 PM      Result Value Range Status   Specimen Description URINE, CATHETERIZED   Final   Special Requests Normal   Final   Culture  Setup Time 06/06/2012 18:12   Final   Colony Count NO GROWTH   Final   Culture NO GROWTH   Final   Report Status 06/07/2012  FINAL   Final  CLOSTRIDIUM DIFFICILE BY PCR     Status: None   Collection Time    06/07/12  3:41 PM      Result Value Range Status   C difficile by pcr NEGATIVE   NEGATIVE Final      Studies:  Ct Abdomen Pelvis Wo Contrast  06/06/2012   *RADIOLOGY REPORT*  Clinical Data: Fever of unknown origin.  Sepsis.  Diarrhea.  CT ABDOMEN AND PELVIS WITHOUT CONTRAST  Technique:  Multidetector CT imaging of the abdomen and pelvis was performed following the standard protocol without intravenous contrast.  Comparison: None.  Findings: Moderate beam hardening artifact is seen throughout the abdomen and pelvis due to the position of the patient's arms.  A Foley catheter is seen within the bladder.  Mildly enlarged prostate gland noted.  There is no evidence of urolithiasis or hydronephrosis.  There is mild asymmetric stranding and possibly a small amount of fluid posterior to the left kidney, which is suboptimally evaluated due to lack of contrast and beam hardening artifact.  This raises possibility of pyelonephritis.  Some motion artifact is also noted.  Mild splenomegaly is seen with spleen measuring approximately 14 cm length.  Noncontrast images of the liver, gallbladder, pancreas, and adrenal glands are normal in appearance.  No soft tissue masses are identified.  No other inflammatory process or abscess identified.  No evidence of dilated bowel loops.  Bone windows show osteolysis and loss of vertebral endplates at L4- 5, consistent with diskitis.  Degenerative disc disease is seen at all other lumbar levels however no other sites of osteolysis identified.  IMPRESSION:  1.  L4-5 diskitis, likely infectious in etiology. Recommend lumbar spine MRI without and with contrast for further evaluation. 2.  Suboptimal evaluation of the abdomen pelvis due to artifact. Asymmetric density posterior to the left kidney, possibly due to pyelonephritis.  No evidence of urolithiasis or hydronephrosis. 3.  Mild splenomegaly.   Original Report Authenticated By: Myles Rosenthal, M.D.   Ct Head Wo Contrast  06/06/2012   *RADIOLOGY REPORT*  Clinical Data: Altered mental status.  Sepsis.  CT HEAD  WITHOUT CONTRAST  Technique:  Contiguous axial images were obtained from the base of the skull through the vertex without contrast.  Comparison: None.  Findings: There is no evidence of intracranial hemorrhage, brain edema or other signs of acute infarction.  There is no evidence of intracranial mass lesion or mass effect.  No abnormal extra-axial fluid collections are identified.  No evidence of hydronephrosis.  No evidence of skull fracture or other bone lesions.  IMPRESSION: No acute intracranial findings.   Original Report Authenticated By: Myles Rosenthal, M.D.   US Renal Port  06/07/2012   *RADIOLOGY REPORT*  Clinical Data:  Acute renal failure.  Hypertension.  RENAL/URINARY TRACT ULTRASOUND COMPLETE  Comparison:  None.  Findings:  Right Kidney:  Enlarged, measuring 14.6 cm in length.  Within normal limits in parenchymal echogenicity.  No evidence of renal mass or hydronephrosis.  Left Kidney:  Enlarged, measuring 15.9 cm length.  Within normal limits in parenchymal echogenicity.  A subcapsular cyst is seen in the posterior mid pole measuring 2.9 cm, which corresponds with the density posterior to the kidney seen on recent CT.  No evidence of solid renal mass or hydronephrosis.  Bladder:  Appears normal for degree of bladder distention. Foley catheter is seen within the bladder.  IMPRESSION:  Mildly enlarged kidneys, without evidence of hydronephrosis.   Original Report Authenticated By: Myles Rosenthal, M.D.  Dg Chest Port 1 View  06/08/2012   *RADIOLOGY REPORT*  Clinical Data: Intubated  PORTABLE CHEST - 1 VIEW  Comparison:   the previous day's study  Findings: Endotracheal tube has been repositioned, tip now approximately 7 cm above carina.  Nasogastric tube and left subclavian central line are stable in position.  Heart size upper limits normal.  Low lung volumes with some coarse airspace opacities in the left mid lung and both lung bases, slightly increased.  No definite effusion.  IMPRESSION:  1.   Repositioned endotracheal tube with slight increase in asymmetric infiltrates or atelectasis.   Original Report Authenticated By: D. Andria Rhein, MD   Dg Chest Port 1 View  06/07/2012   *RADIOLOGY REPORT*  Clinical Data: Endotracheal tube placement.  PORTABLE CHEST - 1 VIEW  Comparison: 06/07/2012 at 5:40 hours  Findings: 1120 hours.  Intubation.  Endotracheal tube terminates 3.1 cm above the carina. Nasogastric extends beyond the  inferior aspect of the film.  Mildly degraded exam due to AP portable technique and patient body habitus.  Cardiomegaly accentuated by AP portable technique.  Mild right hemidiaphragm elevation. No pleural effusion or pneumothorax.  Low lung volumes with resultant pulmonary interstitial prominence. Difficult to exclude mild pulmonary venous congestion. No lobar consolidation.  IMPRESSION: Appropriate position of endotracheal tube.  Cardiomegaly and low lung volumes, without definite acute disease.   Original Report Authenticated By: Jeronimo Greaves, M.D.   Dg Chest Port 1 View  06/07/2012   *RADIOLOGY REPORT*  Clinical Data: Respiratory failure  PORTABLE CHEST - 1 VIEW  Comparison: 06/06/2012  Findings: No areas of lung consolidation/infiltrate.  No evidence of pulmonary edema.  No pleural effusion or pneumothorax.  There are prominent bronchovascular markings that are stable.  The cardiac silhouette is normal in size and configuration.  No mediastinal or hilar mass.  Left subclavian central venous line tip is in the mid to lower superior vena cava, stable.  IMPRESSION: No acute findings.  Stable appearance of the chest from the previous day's study, with no evidence of an infiltrate or pulmonary edema.   Original Report Authenticated By: Amie Portland, M.D.   Dg Chest Port 1 View  06/06/2012   *RADIOLOGY REPORT*  Clinical Data: Evaluate central line placement.  PORTABLE CHEST - 1 VIEW  Comparison: 06/06/2012  Findings: Left subclavian central line tip is in the SVC region. There is  no evidence for a pneumothorax.  There continues to be slightly low lung volumes with mild elevation of the right hemidiaphragm.  Heart size is stable.  No focal airspace disease.  IMPRESSION: Central line tip in the SVC region.  No evidence for a pneumothorax.   Original Report Authenticated By: Richarda Overlie, M.D.   Dg Chest Portable 1 View  06/06/2012   *RADIOLOGY REPORT*  Clinical Data: Sepsis.  Emesis.  Diarrhea.  Nausea.  PORTABLE CHEST - 1 VIEW  Comparison: None.  Findings: 2 frontal views.  Moderate right hemidiaphragm elevation. Cardiomegaly accentuated by AP portable technique.  No pleural effusion or pneumothorax.  Low lung volumes with resultant pulmonary interstitial prominence.  Patchy bibasilar atelectasis.  IMPRESSION: Cardiomegaly and low lung volumes with mild bibasilar atelectasis. No acute findings.  If high clinical concern of acute cardiopulmonary disease, consider PA and lateral radiographs.   Original Report Authenticated By: Jeronimo Greaves, M.D.   Dg Abd Portable 1v  06/07/2012   *RADIOLOGY REPORT*  Clinical Data: Oral gastric tube placement  PORTABLE ABDOMEN - 1 VIEW  Comparison: Abdomen and pelvis CT, 06/06/2012  Findings: The orogastric tube extends below the diaphragm to curl in the proximal stomach.  There is a normal bowel gas pattern.  The included soft tissues are unremarkable.  Degenerative changes are noted along the spine.  IMPRESSION: Orogastric tube is well positioned in the proximal stomach.   Original Report Authenticated By: Amie Portland, M.D.    Assessment: 67 y.o. [place] man with 1. Afib; was on Pradaxa  2. Diarrhea--c diff negative 3. Acute renal failure  4. Sepsis syndrome w/o evidence of DIC--GPC on both blood cultures smears; L4-5 osteolysis felt possibly infectious; on Zosyn, vanco. 5. Acute anemia -- being  transfused PRN 6. Thrombocytopenia possibly due to TTP, pheresis started 06/07/2012--please see discussion in Dr. Lodema Pilot consult note 06/06/2012 7.  Coagulopathy possibly due to Pradaxa, s/p HD 06/07/2012 (reported to decrease level by 2/3ds) 8. Concern re. possible RMSF--on doxicycline--titers in process  Plan: I am pleased to see that patient appears to understand me and nods appropriately. I have written orders for pheresis to be continued daily. Unfortunately he continues to bleed. Platelets are no lower today, hope to see them rising in subsequent days. Dr. Gaylyn Rong will follow in AM   Lowella Dell, MD 06/08/2012  8:26 AM

## 2012-06-08 NOTE — Progress Notes (Signed)
[facility] ADULT ICU REPLACEMENT PROTOCOL FOR AM LAB REPLACEMENT ONLY  The patient does not apply for the [facility] Adult ICU Electrolyte Replacment Protocol based on the criteria listed below:     Is BUN < 60 mg/dL? no  Patient's BUN today is 85. Abnormal electrolyte(s):K3.0   If a panic level lab has been reported, has the CCM MD in charge been notified? yes.   Physician:  Dr Bea Laura Deterding  Melrose Nakayama 06/08/2012 6:20 AM

## 2012-06-08 NOTE — Progress Notes (Signed)
ET tube advanced 4cm to 27 per MD order. Pt tolerated procedure, is getting adequate volumes, and bbs were equal. RT will continue to monitor.

## 2012-06-08 NOTE — Consult Note (Addendum)
INFECTIOUS DISEASE CONSULT NOTE  Date of Admission:  06/06/2012  Date of Consult:  06/08/2012  Reason for Consult: Sepsis Referring Physician: Yacoub  Impression/Recommendation Cellulitis Sepsis ARF Coagulopathy  HUS/TTP?  Pradaxa toxicity Diarrhea (c diff -) Mold in sputum Cx  Would continue anbx, await RMSF serologies Add Ehrlichia serologies Check TEE Recheck BCx Have asked lab to ID the mold in his sputum. Hold on starting antifungal at this point (listed as saprophyte/contaminant by lab)  Comment- His cellulitis seems to be minimal from what I can see (SCD removed with RN). I am not clear what the source of his GPC bacteremia is. He will need TEE. Recheck his BCx. If his Cr was better I would consider giving him gent (short course) but fear this will permanently damage his already frail kidney function at this point.   Thank you so much for this interesting consult,   Johny Sax [phone] [email or web address]  ALEXANDERJAMES BERG is an 67 y.o. male.  HPI: 67 yo M with hx of HTN adm on 5-30 with nightly shaking chills. 4 days PTA he developed diarrhea. ON admission he had SBP 60 despite 3 L hydration. His temp was 102.6, WBC was 12.4, Cr 11.11, PLT 51, INR 4.99.  He was started on vanco/zosyn, norepi,  He was found on CT abd/pelvis 5-31 to have "L4-5 discitis, likely infectious". He was started on HD + plasmapheresis 5-31 to remove his pradaxa (as possible cause of his cytopenias). He also required intbx on 5-31. Doxy was added on 5-31 due to concern for RMSF. His BCx from admission are 2/2 GPC His hx is also notable for cleaning his barn with a toxin prior to arrival as well as having well water.   Past Medical History  Diagnosis Date  . Persistent atrial fibrillation 02/2011    Negative Myoview; Normal EF by Echo  . Obesity (BMI 30-39.9)   . Hypertension   . Dyslipidemia     Past Surgical History  Procedure Laterality Date  . Joint replacement    .  Cardioversion  06/08/2011    Procedure: CARDIOVERSION;  Surgeon: Marykay Lex, MD;  Location: [facility] OR;  Service: Cardiovascular;  Laterality: N/A;     No Known Allergies  Medications:  Scheduled: . antiseptic oral rinse  15 mL Mouth Rinse QID  . chlorhexidine  15 mL Mouth Rinse BID  . Chlorhexidine Gluconate Cloth  6 each Topical Q0600  . doxycycline (VIBRAMYCIN) IV  100 mg Intravenous Q12H  . feeding supplement  60 mL Per Tube TID  . hydrocortisone sodium succinate  50 mg Intravenous Q6H  . magnesium sulfate 1 - 4 g bolus IVPB  2 g Intravenous Once  . mupirocin ointment  1 application Nasal BID  . norepinephrine (LEVOPHED) Adult infusion  2-50 mcg/min Intravenous Once  . piperacillin-tazobactam (ZOSYN)  IV  2.25 g Intravenous Q6H  . potassium chloride  10 mEq Intravenous Q1 Hr x 4  . [START ON 06/09/2012] vancomycin  1,750 mg Intravenous Q48H    Total days of antibiotics:  3 (vanco/doxy/zosyn)          Social History:  reports that he has quit smoking. His smoking use included Cigars. He does not have any smokeless tobacco history on file. He reports that he does not drink alcohol or use illicit drugs.  No family history on file.  General ROS: pt awakens transiently. unable to obtain.   Blood pressure 91/55, pulse 97, temperature 98.9 F (37.2 C), temperature source Core (Comment),  resp. rate 18, height 5\' 11"  (1.803 m), weight 137.4 kg (302 lb 14.6 oz), SpO2 100.00%. General appearance: awakens to voice, agitated on vent Eyes: EOMI Neck: L IJ, dressed with pressure dressing on.  Lungs: rhonchi bilaterally Heart: regular rate and rhythm Abdomen: abnormal findings:  distended, hypoactive bowel sounds and obese Extremities: edema anasarca. scattered papules, scabbed, on extremities. scars from previous knee surgery.    Results for orders placed during the hospital encounter of 06/06/12 (from the past 48 hour(s))  CORTISOL     Status: None   Collection Time    06/06/12 12:39  PM      Result Value Range   Cortisol, Plasma 40.1     Comment: (NOTE)     AM:  4.3 - 22.4 ug/dL     PM:  3.1 - 16.1 ug/dL  CBC     Status: Abnormal   Collection Time    06/06/12  1:19 PM      Result Value Range   WBC 3.6 (*) 4.0 - 10.5 K/uL   RBC 3.56 (*) 4.22 - 5.81 MIL/uL   Hemoglobin 9.4 (*) 13.0 - 17.0 g/dL   Comment: REPEATED TO VERIFY     DELTA CHECK NOTED   HCT 26.5 (*) 39.0 - 52.0 %   MCV 74.4 (*) 78.0 - 100.0 fL   MCH 26.4  26.0 - 34.0 pg   MCHC 35.5  30.0 - 36.0 g/dL   RDW 09.6  04.5 - 40.9 %   Platelets 44 (*) 150 - 400 K/uL   Comment: CONSISTENT WITH PREVIOUS RESULT  COMPREHENSIVE METABOLIC PANEL     Status: Abnormal   Collection Time    06/06/12  1:19 PM      Result Value Range   Sodium 128 (*) 135 - 145 mEq/L   Potassium 3.4 (*) 3.5 - 5.1 mEq/L   Chloride 98  96 - 112 mEq/L   CO2 10 (*) 19 - 32 mEq/L   Comment: CRITICAL RESULT CALLED TO, READ BACK BY AND VERIFIED WITH:     C MERCY,RN 1454 06/06/12 WBOND   Glucose, Bld 132 (*) 70 - 99 mg/dL   BUN 811 (*) 6 - 23 mg/dL   Creatinine, Ser 91.47 (*) 0.50 - 1.35 mg/dL   Calcium 6.6 (*) 8.4 - 10.5 mg/dL   Total Protein 5.2 (*) 6.0 - 8.3 g/dL   Albumin 2.2 (*) 3.5 - 5.2 g/dL   AST 829 (*) 0 - 37 U/L   ALT 71 (*) 0 - 53 U/L   Alkaline Phosphatase 38 (*) 39 - 117 U/L   Total Bilirubin 0.7  0.3 - 1.2 mg/dL   GFR calc non Af Amer 5 (*) >90 mL/min   GFR calc Af Amer 5 (*) >90 mL/min   Comment:            The eGFR has been calculated     using the CKD EPI equation.     This calculation has not been     validated in all clinical     situations.     eGFR's persistently     <90 mL/min signify     possible Chronic Kidney Disease.  LACTIC ACID, PLASMA     Status: None   Collection Time    06/06/12  1:19 PM      Result Value Range   Lactic Acid, Venous 0.9  0.5 - 2.2 mmol/L  CORTISOL     Status: None   Collection Time  06/06/12  1:19 PM      Result Value Range   Cortisol, Plasma 25.7     Comment: (NOTE)      AM:  4.3 - 22.4 ug/dL     PM:  3.1 - 16.1 ug/dL  PROCALCITONIN     Status: None   Collection Time    06/06/12  1:19 PM      Result Value Range   Procalcitonin 9.19     Comment:            Interpretation:     PCT > 2 ng/mL:     Systemic infection (sepsis) is likely,     unless other causes are known.     (NOTE)             ICU PCT Algorithm               Non ICU PCT Algorithm        ----------------------------     ------------------------------             PCT < 0.25 ng/mL                 PCT < 0.1 ng/mL         Stopping of antibiotics            Stopping of antibiotics           strongly encouraged.               strongly encouraged.        ----------------------------     ------------------------------           PCT level decrease by               PCT < 0.25 ng/mL           >= 80% from peak PCT           OR PCT 0.25 - 0.5 ng/mL          Stopping of antibiotics                                                 encouraged.         Stopping of antibiotics               encouraged.        ----------------------------     ------------------------------           PCT level decrease by              PCT >= 0.25 ng/mL           < 80% from peak PCT            AND PCT >= 0.5 ng/mL            Continuing antibiotics                                                  encouraged.           Continuing antibiotics                encouraged.        ----------------------------     ------------------------------  PCT level increase compared          PCT > 0.5 ng/mL             with peak PCT AND              PCT >= 0.5 ng/mL             Escalation of antibiotics                                              strongly encouraged.          Escalation of antibiotics            strongly encouraged.  TROPONIN I     Status: None   Collection Time    06/06/12  1:19 PM      Result Value Range   Troponin I <0.30  <0.30 ng/mL   Comment:            Due to the release kinetics of cTnI,     a  negative result within the first hours     of the onset of symptoms does not rule out     myocardial infarction with certainty.     If myocardial infarction is still suspected,     repeat the test at appropriate intervals.  PROTIME-INR     Status: Abnormal   Collection Time    06/06/12  1:19 PM      Result Value Range   Prothrombin Time 43.1 (*) 11.6 - 15.2 seconds   INR 4.99 (*) 0.00 - 1.49  APTT     Status: Abnormal   Collection Time    06/06/12  1:19 PM      Result Value Range   aPTT 85 (*) 24 - 37 seconds   Comment:            IF BASELINE aPTT IS ELEVATED,     SUGGEST PATIENT RISK ASSESSMENT     BE USED TO DETERMINE APPROPRIATE     ANTICOAGULANT THERAPY.  FIBRINOGEN     Status: None   Collection Time    06/06/12  1:19 PM      Result Value Range   Fibrinogen 349  204 - 475 mg/dL  GLUCOSE, CAPILLARY     Status: Abnormal   Collection Time    06/06/12  1:33 PM      Result Value Range   Glucose-Capillary 129 (*) 70 - 99 mg/dL  TYPE AND SCREEN     Status: None   Collection Time    06/06/12  1:50 PM      Result Value Range   ABO/RH(D) O POS     Antibody Screen NEG     Sample Expiration 06/09/2012     Unit Number [id number]     Blood Component Type RED CELLS,LR     Unit division 00     Status of Unit ISSUED     Transfusion Status OK TO TRANSFUSE     Crossmatch Result Compatible     Unit Number [id number]     Blood Component Type RED CELLS,LR     Unit division 00     Status of Unit ISSUED     Transfusion Status OK TO TRANSFUSE     Crossmatch Result Compatible     Unit Number [id number]     Blood Component Type RED  CELLS,LR     Unit division 00     Status of Unit ISSUED     Transfusion Status OK TO TRANSFUSE     Crossmatch Result Compatible     Unit Number [id number]     Blood Component Type RED CELLS,LR     Unit division 00     Status of Unit ISSUED     Transfusion Status OK TO TRANSFUSE     Crossmatch Result Compatible     Unit Number  [id number]     Blood Component Type RED CELLS,LR     Unit division 00     Status of Unit ALLOCATED     Transfusion Status OK TO TRANSFUSE     Crossmatch Result Compatible     Unit Number [id number]     Blood Component Type RED CELLS,LR     Unit division 00     Status of Unit ALLOCATED     Transfusion Status OK TO TRANSFUSE     Crossmatch Result Compatible    ABO/RH     Status: None   Collection Time    06/06/12  1:50 PM      Result Value Range   ABO/RH(D) O POS    PREPARE RBC (CROSSMATCH)     Status: None   Collection Time    06/06/12  1:50 PM      Result Value Range   Order Confirmation ORDER PROCESSED BY BLOOD BANK    POCT I-STAT 3, BLOOD GAS (G3+)     Status: Abnormal   Collection Time    06/06/12  1:59 PM      Result Value Range   pH, Arterial 7.295 (*) 7.350 - 7.450   pCO2 arterial 21.6 (*) 35.0 - 45.0 mmHg   pO2, Arterial 116.0 (*) 80.0 - 100.0 mmHg   Bicarbonate 10.4 (*) 20.0 - 24.0 mEq/L   TCO2 11  0 - 100 mmol/L   O2 Saturation 98.0     Acid-base deficit 14.0 (*) 0.0 - 2.0 mmol/L   Patient temperature 100.1 F     Collection site ARTERIAL LINE     Drawn by Operator     Sample type ARTERIAL    MRSA PCR SCREENING     Status: Abnormal   Collection Time    06/06/12  2:01 PM      Result Value Range   MRSA by PCR POSITIVE (*) NEGATIVE   Comment:            The GeneXpert MRSA Assay (FDA     approved for NASAL specimens     only), is one component of a     comprehensive MRSA colonization     surveillance program. It is not     intended to diagnose MRSA     infection nor to guide or     monitor treatment for     MRSA infections.     RESULT CALLED TO, READ BACK BY AND VERIFIED WITH:     R. FOUNTAIN RN 16:45 06/06/12 (wilsonm)  CARBOXYHEMOGLOBIN     Status: Abnormal   Collection Time    06/06/12  2:55 PM      Result Value Range   Total hemoglobin 12.7 (*) 13.5 - 18.0 g/dL   O2 Saturation 60.6     Carboxyhemoglobin 1.1  0.5 - 1.5 %   Methemoglobin 0.9   0.0 - 1.5 %  URINALYSIS, ROUTINE W REFLEX MICROSCOPIC     Status: Abnormal   Collection Time  06/06/12  4:59 PM      Result Value Range   Color, Urine RED (*) YELLOW   Comment: BIOCHEMICALS MAY BE AFFECTED BY COLOR   APPearance TURBID (*) CLEAR   Specific Gravity, Urine 1.020  1.005 - 1.030   pH 5.5  5.0 - 8.0   Glucose, UA 100 (*) NEGATIVE mg/dL   Hgb urine dipstick LARGE (*) NEGATIVE   Bilirubin Urine LARGE (*) NEGATIVE   Ketones, ur 15 (*) NEGATIVE mg/dL   Protein, ur >213 (*) NEGATIVE mg/dL   Urobilinogen, UA 0.2  0.0 - 1.0 mg/dL   Nitrite POSITIVE (*) NEGATIVE   Leukocytes, UA MODERATE (*) NEGATIVE  URINE CULTURE     Status: None   Collection Time    06/06/12  4:59 PM      Result Value Range   Specimen Description URINE, CATHETERIZED     Special Requests Normal     Culture  Setup Time 06/06/2012 18:12     Colony Count NO GROWTH     Culture NO GROWTH     Report Status 06/07/2012 FINAL    URINE MICROSCOPIC-ADD ON     Status: Abnormal   Collection Time    06/06/12  4:59 PM      Result Value Range   WBC, UA 0-2  <3 WBC/hpf   RBC / HPF TOO NUMEROUS TO COUNT  <3 RBC/hpf   Bacteria, UA FEW (*) RARE   Urine-Other AMORPHOUS URATES/PHOSPHATES    PREPARE FRESH FROZEN PLASMA     Status: None   Collection Time    06/06/12  5:00 PM      Result Value Range   Unit Number [id number]     Blood Component Type THAWED PLASMA     Unit division 00     Status of Unit ISSUED     Transfusion Status OK TO TRANSFUSE     Unit Number [id number]     Blood Component Type THAWED PLASMA     Unit division 00     Status of Unit ISSUED     Transfusion Status OK TO TRANSFUSE     Unit Number [id number]     Blood Component Type THAWED PLASMA     Unit division 00     Status of Unit ISSUED     Transfusion Status OK TO TRANSFUSE     Unit Number [id number]     Blood Component Type THAWED PLASMA     Unit division 00     Status of Unit ISSUED     Transfusion Status OK TO TRANSFUSE      GLUCOSE, CAPILLARY     Status: Abnormal   Collection Time    06/06/12  5:33 PM      Result Value Range   Glucose-Capillary 105 (*) 70 - 99 mg/dL  GLUCOSE, CAPILLARY     Status: Abnormal   Collection Time    06/06/12  8:28 PM      Result Value Range   Glucose-Capillary 115 (*) 70 - 99 mg/dL  CBC     Status: Abnormal   Collection Time    06/06/12 10:33 PM      Result Value Range   WBC 3.3 (*) 4.0 - 10.5 K/uL   RBC 3.12 (*) 4.22 - 5.81 MIL/uL   Hemoglobin 8.3 (*) 13.0 - 17.0 g/dL   HCT 95.1 (*) 88.4 - 16.6 %   MCV 74.4 (*) 78.0 - 100.0 fL   MCH 26.6  26.0 - 34.0 pg  MCHC 35.8  30.0 - 36.0 g/dL   RDW 21.3  08.6 - 57.8 %   Platelets 36 (*) 150 - 400 K/uL   Comment: CONSISTENT WITH PREVIOUS RESULT  COMPREHENSIVE METABOLIC PANEL     Status: Abnormal   Collection Time    06/06/12 10:33 PM      Result Value Range   Sodium 134 (*) 135 - 145 mEq/L   Potassium 3.6  3.5 - 5.1 mEq/L   Chloride 96  96 - 112 mEq/L   CO2 16 (*) 19 - 32 mEq/L   Glucose, Bld 192 (*) 70 - 99 mg/dL   BUN 469 (*) 6 - 23 mg/dL   Creatinine, Ser 6.29 (*) 0.50 - 1.35 mg/dL   Calcium 7.2 (*) 8.4 - 10.5 mg/dL   Total Protein 5.6 (*) 6.0 - 8.3 g/dL   Albumin 2.4 (*) 3.5 - 5.2 g/dL   AST 528 (*) 0 - 37 U/L   ALT 71 (*) 0 - 53 U/L   Alkaline Phosphatase 45  39 - 117 U/L   Total Bilirubin 0.6  0.3 - 1.2 mg/dL   GFR calc non Af Amer 5 (*) >90 mL/min   GFR calc Af Amer 6 (*) >90 mL/min   Comment:            The eGFR has been calculated     using the CKD EPI equation.     This calculation has not been     validated in all clinical     situations.     eGFR's persistently     <90 mL/min signify     possible Chronic Kidney Disease.  MAGNESIUM     Status: None   Collection Time    06/06/12 10:33 PM      Result Value Range   Magnesium 2.0  1.5 - 2.5 mg/dL  SAVE SMEAR     Status: None   Collection Time    06/06/12 10:33 PM      Result Value Range   Smear Review SMEAR STAINED AND AVAILABLE FOR REVIEW     TROPONIN I     Status: None   Collection Time    06/06/12 10:41 PM      Result Value Range   Troponin I <0.30  <0.30 ng/mL   Comment:            Due to the release kinetics of cTnI,     a negative result within the first hours     of the onset of symptoms does not rule out     myocardial infarction with certainty.     If myocardial infarction is still suspected,     repeat the test at appropriate intervals.  DIC (DISSEMINATED INTRAVASCULAR COAGULATION) PANEL     Status: Abnormal   Collection Time    06/06/12 10:43 PM      Result Value Range   Prothrombin Time 35.2 (*) 11.6 - 15.2 seconds   INR 3.80 (*) 0.00 - 1.49   aPTT 83 (*) 24 - 37 seconds   Comment:            IF BASELINE aPTT IS ELEVATED,     SUGGEST PATIENT RISK ASSESSMENT     BE USED TO DETERMINE APPROPRIATE     ANTICOAGULANT THERAPY.   Fibrinogen 344  204 - 475 mg/dL   D-Dimer, Quant 4.13 (*) 0.00 - 0.48 ug/mL-FEU   Comment:            AT THE  INHOUSE ESTABLISHED CUTOFF     VALUE OF 0.48 ug/mL FEU,     THIS ASSAY HAS BEEN DOCUMENTED     IN THE LITERATURE TO HAVE     A SENSITIVITY AND NEGATIVE     PREDICTIVE VALUE OF AT LEAST     98 TO 99%.  THE TEST RESULT     SHOULD BE CORRELATED WITH     AN ASSESSMENT OF THE CLINICAL     PROBABILITY OF DVT / VTE.   Platelets 36 (*) 150 - 400 K/uL   Comment: CONSISTENT WITH PREVIOUS RESULT   Smear Review NO SCHISTOCYTES SEEN    LACTATE DEHYDROGENASE     Status: Abnormal   Collection Time    06/06/12 11:36 PM      Result Value Range   LDH 352 (*) 94 - 250 U/L  GLUCOSE, CAPILLARY     Status: Abnormal   Collection Time    06/07/12 12:14 AM      Result Value Range   Glucose-Capillary 163 (*) 70 - 99 mg/dL  BLOOD GAS, ARTERIAL     Status: Abnormal   Collection Time    06/07/12  1:38 AM      Result Value Range   O2 Content 3.0     Delivery systems NASAL CANNULA     pH, Arterial 7.416  7.350 - 7.450   pCO2 arterial 28.6 (*) 35.0 - 45.0 mmHg   pO2, Arterial 77.0 (*) 80.0 -  100.0 mmHg   Bicarbonate 18.0 (*) 20.0 - 24.0 mEq/L   TCO2 18.9  0 - 100 mmol/L   Acid-base deficit 5.7 (*) 0.0 - 2.0 mmol/L   O2 Saturation 97.9     Patient temperature 98.6     Collection site A-LINE     Drawn by COLLECTED BY NURSE     Sample type ARTERIAL DRAW    TECHNOLOGIST SMEAR REVIEW     Status: None   Collection Time    06/07/12  3:50 AM      Result Value Range   Tech Review ELLIPTOCYTES     Comment: TEARDROP CELLS  LACTIC ACID, PLASMA     Status: None   Collection Time    06/07/12  3:50 AM      Result Value Range   Lactic Acid, Venous 1.6  0.5 - 2.2 mmol/L  CBC     Status: Abnormal   Collection Time    06/07/12  3:54 AM      Result Value Range   WBC 2.8 (*) 4.0 - 10.5 K/uL   RBC 2.78 (*) 4.22 - 5.81 MIL/uL   Hemoglobin 7.4 (*) 13.0 - 17.0 g/dL   HCT 16.1 (*) 09.6 - 04.5 %   MCV 74.5 (*) 78.0 - 100.0 fL   MCH 26.6  26.0 - 34.0 pg   MCHC 35.7  30.0 - 36.0 g/dL   RDW 40.9  81.1 - 91.4 %   Platelets 34 (*) 150 - 400 K/uL   Comment: CONSISTENT WITH PREVIOUS RESULT  BASIC METABOLIC PANEL     Status: Abnormal   Collection Time    06/07/12  3:54 AM      Result Value Range   Sodium 134 (*) 135 - 145 mEq/L   Potassium 3.1 (*) 3.5 - 5.1 mEq/L   Chloride 95 (*) 96 - 112 mEq/L   CO2 20  19 - 32 mEq/L   Glucose, Bld 172 (*) 70 - 99 mg/dL   BUN 782 (*) 6 - 23  mg/dL   Creatinine, Ser 1.61 (*) 0.50 - 1.35 mg/dL   Calcium 7.1 (*) 8.4 - 10.5 mg/dL   GFR calc non Af Amer 5 (*) >90 mL/min   GFR calc Af Amer 6 (*) >90 mL/min   Comment:            The eGFR has been calculated     using the CKD EPI equation.     This calculation has not been     validated in all clinical     situations.     eGFR's persistently     <90 mL/min signify     possible Chronic Kidney Disease.  MAGNESIUM     Status: None   Collection Time    06/07/12  3:54 AM      Result Value Range   Magnesium 1.9  1.5 - 2.5 mg/dL  PHOSPHORUS     Status: Abnormal   Collection Time    06/07/12  3:54 AM       Result Value Range   Phosphorus 5.5 (*) 2.3 - 4.6 mg/dL  TROPONIN I     Status: None   Collection Time    06/07/12  3:54 AM      Result Value Range   Troponin I <0.30  <0.30 ng/mL   Comment:            Due to the release kinetics of cTnI,     a negative result within the first hours     of the onset of symptoms does not rule out     myocardial infarction with certainty.     If myocardial infarction is still suspected,     repeat the test at appropriate intervals.  GLUCOSE, CAPILLARY     Status: Abnormal   Collection Time    06/07/12  4:02 AM      Result Value Range   Glucose-Capillary 149 (*) 70 - 99 mg/dL   Comment 1 Documented in Chart     Comment 2 Notify RN    RENAL FUNCTION PANEL     Status: Abnormal   Collection Time    06/07/12  4:30 AM      Result Value Range   Sodium 135  135 - 145 mEq/L   Potassium 3.2 (*) 3.5 - 5.1 mEq/L   Chloride 97  96 - 112 mEq/L   CO2 20  19 - 32 mEq/L   Glucose, Bld 161 (*) 70 - 99 mg/dL   BUN 096 (*) 6 - 23 mg/dL   Creatinine, Ser 0.45 (*) 0.50 - 1.35 mg/dL   Calcium 7.1 (*) 8.4 - 10.5 mg/dL   Phosphorus 5.5 (*) 2.3 - 4.6 mg/dL   Albumin 2.3 (*) 3.5 - 5.2 g/dL   GFR calc non Af Amer 5 (*) >90 mL/min   GFR calc Af Amer 6 (*) >90 mL/min   Comment:            The eGFR has been calculated     using the CKD EPI equation.     This calculation has not been     validated in all clinical     situations.     eGFR's persistently     <90 mL/min signify     possible Chronic Kidney Disease.  HEPATITIS B SURFACE ANTIGEN     Status: None   Collection Time    06/07/12  4:30 AM      Result Value Range   Hepatitis B Surface Ag NEGATIVE  NEGATIVE  HEPATITIS B SURFACE ANTIBODY     Status: None   Collection Time    06/07/12  4:30 AM      Result Value Range   Hep B S Ab NONREACTIVE  NONREACTIVE  HEPATITIS B CORE ANTIBODY, TOTAL     Status: None   Collection Time    06/07/12  4:30 AM      Result Value Range   Hep B Core Total Ab NEGATIVE   NEGATIVE  GLUCOSE, CAPILLARY     Status: Abnormal   Collection Time    06/07/12  7:34 AM      Result Value Range   Glucose-Capillary 125 (*) 70 - 99 mg/dL  PREPARE RBC (CROSSMATCH)     Status: None   Collection Time    06/07/12  7:54 AM      Result Value Range   Order Confirmation ORDER PROCESSED BY BLOOD BANK    THERAPEUTIC PLASMA EXCHANGE     Status: None   Collection Time    06/07/12 10:37 AM      Result Value Range   Plasma Exchange 4253 THAWED     Plasma volume needed 4250     Unit Number [id number]     Blood Component Type THAWED PLASMA     Unit division 00     Status of Unit ISSUED     Transfusion Status OK TO TRANSFUSE     Unit Number [id number]     Blood Component Type THAWED PLASMA     Unit division 00     Status of Unit ISSUED     Transfusion Status OK TO TRANSFUSE     Unit Number [id number]     Blood Component Type THAWED PLASMA     Unit division 00     Status of Unit ISSUED     Transfusion Status OK TO TRANSFUSE     Unit Number [id number]     Blood Component Type THAWED PLASMA     Unit division 00     Status of Unit ISSUED     Transfusion Status OK TO TRANSFUSE     Unit Number [id number]     Blood Component Type THAWED PLASMA     Unit division 00     Status of Unit ISSUED     Transfusion Status OK TO TRANSFUSE     Unit Number [id number]     Blood Component Type THAWED PLASMA     Unit division 00     Status of Unit ISSUED     Transfusion Status OK TO TRANSFUSE     Unit Number [id number]     Blood Component Type THAWED PLASMA     Unit division 00     Status of Unit ISSUED     Transfusion Status OK TO TRANSFUSE     Unit Number [id number]     Blood Component Type THAWED PLASMA     Unit division 00     Status of Unit ISSUED     Transfusion Status OK TO TRANSFUSE     Unit Number [id number]     Blood Component Type THAWED PLASMA     Unit division 00     Status of Unit ISSUED     Transfusion Status OK TO TRANSFUSE     Unit  Number [id number]     Blood Component Type THAWED PLASMA     Unit division 00     Status of Unit ISSUED     Transfusion Status OK TO TRANSFUSE  Unit Number [id number]     Blood Component Type THAWED PLASMA     Unit division 00     Status of Unit ISSUED     Transfusion Status OK TO TRANSFUSE     Unit Number [id number]     Blood Component Type THAWED PLASMA     Unit division 00     Status of Unit ISSUED     Transfusion Status OK TO TRANSFUSE     Unit Number [id number]     Blood Component Type THAWED PLASMA     Unit division 00     Status of Unit ISSUED     Transfusion Status OK TO TRANSFUSE    TROPONIN I     Status: None   Collection Time    06/07/12 10:41 AM      Result Value Range   Troponin I <0.30  <0.30 ng/mL   Comment:            Due to the release kinetics of cTnI,     a negative result within the first hours     of the onset of symptoms does not rule out     myocardial infarction with certainty.     If myocardial infarction is still suspected,     repeat the test at appropriate intervals.  PROTIME-INR     Status: Abnormal   Collection Time    06/07/12 10:45 AM      Result Value Range   Prothrombin Time 23.0 (*) 11.6 - 15.2 seconds   INR 2.14 (*) 0.00 - 1.49  APTT     Status: Abnormal   Collection Time    06/07/12 10:45 AM      Result Value Range   aPTT 58 (*) 24 - 37 seconds   Comment:            IF BASELINE aPTT IS ELEVATED,     SUGGEST PATIENT RISK ASSESSMENT     BE USED TO DETERMINE APPROPRIATE     ANTICOAGULANT THERAPY.  CBC     Status: Abnormal   Collection Time    06/07/12 10:45 AM      Result Value Range   WBC 4.3  4.0 - 10.5 K/uL   RBC 3.50 (*) 4.22 - 5.81 MIL/uL   Hemoglobin 9.4 (*) 13.0 - 17.0 g/dL   Comment: POST TRANSFUSION SPECIMEN   HCT 26.5 (*) 39.0 - 52.0 %   MCV 75.7 (*) 78.0 - 100.0 fL   MCH 26.9  26.0 - 34.0 pg   MCHC 35.5  30.0 - 36.0 g/dL   RDW 52.8  41.3 - 24.4 %   Platelets 44 (*) 150 - 400 K/uL   Comment:  CONSISTENT WITH PREVIOUS RESULT  GLUCOSE, CAPILLARY     Status: Abnormal   Collection Time    06/07/12 11:20 AM      Result Value Range   Glucose-Capillary 120 (*) 70 - 99 mg/dL   Comment 1 Notify RN    CULTURE, RESPIRATORY (NON-EXPECTORATED)     Status: None   Collection Time    06/07/12 11:44 AM      Result Value Range   Specimen Description TRACHEAL ASPIRATE     Special Requests NONE     Gram Stain PENDING     Culture       Value: FEW FUNGUS (MOLD) ISOLATED, PROBABLE CONTAMINANT/COLONIZER (SAPROPHYTE). CONTACT MICROBIOLOGY IF FURTHER IDENTIFICATION REQUIRED [phone].   Report Status PENDING    POCT I-STAT 3, BLOOD GAS (G3+)  Status: Abnormal   Collection Time    06/07/12 11:49 AM      Result Value Range   pH, Arterial 7.433  7.350 - 7.450   pCO2 arterial 40.9  35.0 - 45.0 mmHg   pO2, Arterial 388.0 (*) 80.0 - 100.0 mmHg   Bicarbonate 26.8 (*) 20.0 - 24.0 mEq/L   TCO2 28  0 - 100 mmol/L   O2 Saturation 100.0     Acid-Base Excess 3.0 (*) 0.0 - 2.0 mmol/L   Patient temperature 102.9 F     Collection site ARTERIAL LINE     Drawn by Operator     Sample type ARTERIAL    PROTIME-INR     Status: Abnormal   Collection Time    06/07/12  3:25 PM      Result Value Range   Prothrombin Time 21.3 (*) 11.6 - 15.2 seconds   INR 1.93 (*) 0.00 - 1.49  CBC     Status: Abnormal   Collection Time    06/07/12  3:25 PM      Result Value Range   WBC 2.2 (*) 4.0 - 10.5 K/uL   RBC 2.73 (*) 4.22 - 5.81 MIL/uL   Hemoglobin 7.3 (*) 13.0 - 17.0 g/dL   Comment: REPEATED TO VERIFY     DELTA CHECK NOTED   HCT 20.7 (*) 39.0 - 52.0 %   MCV 75.8 (*) 78.0 - 100.0 fL   MCH 26.7  26.0 - 34.0 pg   MCHC 35.3  30.0 - 36.0 g/dL   RDW 16.1  09.6 - 04.5 %   Platelets 35 (*) 150 - 400 K/uL   Comment: REPEATED TO VERIFY     CONSISTENT WITH PREVIOUS RESULT  CLOSTRIDIUM DIFFICILE BY PCR     Status: None   Collection Time    06/07/12  3:41 PM      Result Value Range   C difficile by pcr NEGATIVE   NEGATIVE  GLUCOSE, CAPILLARY     Status: Abnormal   Collection Time    06/07/12  3:48 PM      Result Value Range   Glucose-Capillary 183 (*) 70 - 99 mg/dL  PREPARE FRESH FROZEN PLASMA     Status: None   Collection Time    06/07/12  5:23 PM      Result Value Range   Unit Number [id number]     Blood Component Type THAWED PLASMA     Unit division 00     Status of Unit ISSUED     Transfusion Status OK TO TRANSFUSE     Unit Number [id number]     Blood Component Type THAWED PLASMA     Unit division 00     Status of Unit ISSUED     Transfusion Status OK TO TRANSFUSE    PREPARE PLATELET PHERESIS     Status: None   Collection Time    06/07/12  5:23 PM      Result Value Range   Unit Number [id number]     Blood Component Type PLTPHER LR2     Unit division 00     Status of Unit ISSUED     Transfusion Status OK TO TRANSFUSE    GLUCOSE, CAPILLARY     Status: Abnormal   Collection Time    06/07/12  7:41 PM      Result Value Range   Glucose-Capillary 175 (*) 70 - 99 mg/dL   Comment 1 Notify RN    GLUCOSE, CAPILLARY  Status: Abnormal   Collection Time    06/08/12 12:08 AM      Result Value Range   Glucose-Capillary 194 (*) 70 - 99 mg/dL   Comment 1 Notify RN    BLOOD GAS, ARTERIAL     Status: Abnormal   Collection Time    06/08/12  3:51 AM      Result Value Range   FIO2 0.50     Delivery systems VENTILATOR     Mode PRESSURE REGULATED VOLUME CONTROL     VT 600     Rate 15     Peep/cpap 5.0     pH, Arterial 7.488 (*) 7.350 - 7.450   pCO2 arterial 39.8  35.0 - 45.0 mmHg   pO2, Arterial 201.0 (*) 80.0 - 100.0 mmHg   Bicarbonate 29.6 (*) 20.0 - 24.0 mEq/L   TCO2 30.7  0 - 100 mmol/L   Acid-Base Excess 6.2 (*) 0.0 - 2.0 mmol/L   O2 Saturation 99.9     Patient temperature 100.5     Collection site A-LINE     Drawn by [id number]     Sample type ARTERIAL DRAW     Allens test (pass/fail) PASS  PASS  CBC     Status: Abnormal   Collection Time    06/08/12  3:53 AM       Result Value Range   WBC 2.3 (*) 4.0 - 10.5 K/uL   RBC 2.10 (*) 4.22 - 5.81 MIL/uL   Hemoglobin 5.6 (*) 13.0 - 17.0 g/dL   Comment: REPEATED TO VERIFY     CRITICAL RESULT CALLED TO, READ BACK BY AND VERIFIED WITH:     C HAYES,RN [id number] 06/08/12 D BRADLEY   HCT 16.3 (*) 39.0 - 52.0 %   MCV 77.6 (*) 78.0 - 100.0 fL   MCH 26.7  26.0 - 34.0 pg   MCHC 34.4  30.0 - 36.0 g/dL   RDW 62.1  30.8 - 65.7 %   Platelets 32 (*) 150 - 400 K/uL   Comment: CONSISTENT WITH PREVIOUS RESULT  BASIC METABOLIC PANEL     Status: Abnormal   Collection Time    06/08/12  3:53 AM      Result Value Range   Sodium 138  135 - 145 mEq/L   Potassium 3.0 (*) 3.5 - 5.1 mEq/L   Chloride 94 (*) 96 - 112 mEq/L   CO2 30  19 - 32 mEq/L   Glucose, Bld 219 (*) 70 - 99 mg/dL   BUN 85 (*) 6 - 23 mg/dL   Comment: DELTA CHECK NOTED   Creatinine, Ser 4.83 (*) 0.50 - 1.35 mg/dL   Comment: DELTA CHECK NOTED   Calcium 7.5 (*) 8.4 - 10.5 mg/dL   GFR calc non Af Amer 11 (*) >90 mL/min   GFR calc Af Amer 13 (*) >90 mL/min   Comment:            The eGFR has been calculated     using the CKD EPI equation.     This calculation has not been     validated in all clinical     situations.     eGFR's persistently     <90 mL/min signify     possible Chronic Kidney Disease.  MAGNESIUM     Status: None   Collection Time    06/08/12  3:53 AM      Result Value Range   Magnesium 1.9  1.5 - 2.5 mg/dL  PHOSPHORUS  Status: None   Collection Time    06/08/12  3:53 AM      Result Value Range   Phosphorus 4.5  2.3 - 4.6 mg/dL  APTT     Status: Abnormal   Collection Time    06/08/12  3:53 AM      Result Value Range   aPTT 57 (*) 24 - 37 seconds   Comment:            IF BASELINE aPTT IS ELEVATED,     SUGGEST PATIENT RISK ASSESSMENT     BE USED TO DETERMINE APPROPRIATE     ANTICOAGULANT THERAPY.  GLUCOSE, CAPILLARY     Status: Abnormal   Collection Time    06/08/12  3:59 AM      Result Value Range   Glucose-Capillary 203 (*) 70 -  99 mg/dL   Comment 1 Notify RN    GLUCOSE, CAPILLARY     Status: Abnormal   Collection Time    06/08/12  7:24 AM      Result Value Range   Glucose-Capillary 243 (*) 70 - 99 mg/dL  THERAPEUTIC PLASMA EXCHANGE     Status: None   Collection Time    06/08/12  8:30 AM      Result Value Range   Plasma Exchange ORDERED, THAWED     Plasma volume needed 1     Unit Number [id number]     Blood Component Type THAWED PLASMA     Unit division 00     Status of Unit ISSUED     Transfusion Status OK TO TRANSFUSE     Unit Number [id number]     Blood Component Type THAWED PLASMA     Unit division 00     Status of Unit ISSUED     Transfusion Status OK TO TRANSFUSE     Unit Number [id number]     Blood Component Type THAWED PLASMA     Unit division 00     Status of Unit ISSUED     Transfusion Status OK TO TRANSFUSE     Unit Number [id number]     Blood Component Type THAWED PLASMA     Unit division 00     Status of Unit ISSUED     Transfusion Status OK TO TRANSFUSE     Unit Number [id number]     Blood Component Type THAWED PLASMA     Unit division 00     Status of Unit ISSUED     Transfusion Status OK TO TRANSFUSE     Unit Number [id number]     Blood Component Type THAWED PLASMA     Unit division 00     Status of Unit ISSUED     Transfusion Status OK TO TRANSFUSE     Unit Number [id number]     Blood Component Type THAWED PLASMA     Unit division 00     Status of Unit ISSUED     Transfusion Status OK TO TRANSFUSE     Unit Number [id number]     Blood Component Type THAWED PLASMA     Unit division 00     Status of Unit ISSUED     Transfusion Status OK TO TRANSFUSE     Unit Number [id number]     Blood Component Type THAWED PLASMA     Unit division 00     Status of Unit ISSUED     Transfusion Status OK TO TRANSFUSE  Unit Number [id number]     Blood Component Type THAWED PLASMA     Unit division 00     Status of Unit ISSUED      Transfusion Status OK TO TRANSFUSE     Unit Number [id number]     Blood Component Type THAWED PLASMA     Unit division 00     Status of Unit ISSUED     Transfusion Status OK TO TRANSFUSE     Unit Number [id number]     Blood Component Type THAWED PLASMA     Unit division 00     Status of Unit ISSUED     Transfusion Status OK TO TRANSFUSE     Unit Number [id number]     Blood Component Type THAWED PLASMA     Unit division 00     Status of Unit ISSUED     Transfusion Status OK TO TRANSFUSE     Unit Number [id number]     Blood Component Type THAWED PLASMA     Unit division 00     Status of Unit ISSUED     Transfusion Status OK TO TRANSFUSE    PROTIME-INR     Status: Abnormal   Collection Time    06/08/12  9:00 AM      Result Value Range   Prothrombin Time 20.2 (*) 11.6 - 15.2 seconds   INR 1.79 (*) 0.00 - 1.49  APTT     Status: Abnormal   Collection Time    06/08/12  9:00 AM      Result Value Range   aPTT 50 (*) 24 - 37 seconds   Comment:            IF BASELINE aPTT IS ELEVATED,     SUGGEST PATIENT RISK ASSESSMENT     BE USED TO DETERMINE APPROPRIATE     ANTICOAGULANT THERAPY.  PREPARE RBC (CROSSMATCH)     Status: None   Collection Time    06/08/12 10:51 AM      Result Value Range   Order Confirmation ORDER PROCESSED BY BLOOD BANK    PREPARE FRESH FROZEN PLASMA     Status: None   Collection Time    06/08/12 11:30 AM      Result Value Range   Unit Number [id number]     Blood Component Type THAWED PLASMA     Unit division 00     Status of Unit ALLOCATED     Transfusion Status OK TO TRANSFUSE     Unit Number [id number]     Blood Component Type THAWED PLASMA     Unit division 00     Status of Unit ALLOCATED     Transfusion Status OK TO TRANSFUSE    GLUCOSE, CAPILLARY     Status: Abnormal   Collection Time    06/08/12 11:34 AM      Result Value Range   Glucose-Capillary 256 (*) 70 - 99 mg/dL      Component Value Date/Time   SDES TRACHEAL  ASPIRATE 06/07/2012 1144   SPECREQUEST NONE 06/07/2012 1144   CULT FEW FUNGUS (MOLD) ISOLATED, PROBABLE CONTAMINANT/COLONIZER (SAPROPHYTE). CONTACT MICROBIOLOGY IF FURTHER IDENTIFICATION REQUIRED [phone]. 06/07/2012 1144   REPTSTATUS PENDING 06/07/2012 1144   Ct Abdomen Pelvis Wo Contrast  06/06/2012   *RADIOLOGY REPORT*  Clinical Data: Fever of unknown origin.  Sepsis.  Diarrhea.  CT ABDOMEN AND PELVIS WITHOUT CONTRAST  Technique:  Multidetector CT imaging of the abdomen and pelvis was performed following the standard  protocol without intravenous contrast.  Comparison: None.  Findings: Moderate beam hardening artifact is seen throughout the abdomen and pelvis due to the position of the patient's arms.  A Foley catheter is seen within the bladder.  Mildly enlarged prostate gland noted.  There is no evidence of urolithiasis or hydronephrosis.  There is mild asymmetric stranding and possibly a small amount of fluid posterior to the left kidney, which is suboptimally evaluated due to lack of contrast and beam hardening artifact.  This raises possibility of pyelonephritis.  Some motion artifact is also noted.  Mild splenomegaly is seen with spleen measuring approximately 14 cm length.  Noncontrast images of the liver, gallbladder, pancreas, and adrenal glands are normal in appearance.  No soft tissue masses are identified.  No other inflammatory process or abscess identified.  No evidence of dilated bowel loops.  Bone windows show osteolysis and loss of vertebral endplates at L4- 5, consistent with diskitis.  Degenerative disc disease is seen at all other lumbar levels however no other sites of osteolysis identified.  IMPRESSION:  1.  L4-5 diskitis, likely infectious in etiology. Recommend lumbar spine MRI without and with contrast for further evaluation. 2.  Suboptimal evaluation of the abdomen pelvis due to artifact. Asymmetric density posterior to the left kidney, possibly due to pyelonephritis.  No evidence  of urolithiasis or hydronephrosis. 3.  Mild splenomegaly.   Original Report Authenticated By: Myles Rosenthal, M.D.   Ct Head Wo Contrast  06/06/2012   *RADIOLOGY REPORT*  Clinical Data: Altered mental status.  Sepsis.  CT HEAD WITHOUT CONTRAST  Technique:  Contiguous axial images were obtained from the base of the skull through the vertex without contrast.  Comparison: None.  Findings: There is no evidence of intracranial hemorrhage, brain edema or other signs of acute infarction.  There is no evidence of intracranial mass lesion or mass effect.  No abnormal extra-axial fluid collections are identified.  No evidence of hydronephrosis.  No evidence of skull fracture or other bone lesions.  IMPRESSION: No acute intracranial findings.   Original Report Authenticated By: Myles Rosenthal, M.D.   US Renal Port  06/07/2012   *RADIOLOGY REPORT*  Clinical Data:  Acute renal failure.  Hypertension.  RENAL/URINARY TRACT ULTRASOUND COMPLETE  Comparison:  None.  Findings:  Right Kidney:  Enlarged, measuring 14.6 cm in length.  Within normal limits in parenchymal echogenicity.  No evidence of renal mass or hydronephrosis.  Left Kidney:  Enlarged, measuring 15.9 cm length.  Within normal limits in parenchymal echogenicity.  A subcapsular cyst is seen in the posterior mid pole measuring 2.9 cm, which corresponds with the density posterior to the kidney seen on recent CT.  No evidence of solid renal mass or hydronephrosis.  Bladder:  Appears normal for degree of bladder distention. Foley catheter is seen within the bladder.  IMPRESSION:  Mildly enlarged kidneys, without evidence of hydronephrosis.   Original Report Authenticated By: Myles Rosenthal, M.D.   Dg Chest Port 1 View  06/08/2012   *RADIOLOGY REPORT*  Clinical Data: Intubated  PORTABLE CHEST - 1 VIEW  Comparison:   the previous day's study  Findings: Endotracheal tube has been repositioned, tip now approximately 7 cm above carina.  Nasogastric tube and left subclavian central  line are stable in position.  Heart size upper limits normal.  Low lung volumes with some coarse airspace opacities in the left mid lung and both lung bases, slightly increased.  No definite effusion.  IMPRESSION:  1.  Repositioned endotracheal tube with slight  increase in asymmetric infiltrates or atelectasis.   Original Report Authenticated By: D. Andria Rhein, MD   Dg Chest Port 1 View  06/07/2012   *RADIOLOGY REPORT*  Clinical Data: Endotracheal tube placement.  PORTABLE CHEST - 1 VIEW  Comparison: 06/07/2012 at 5:40 hours  Findings: 1120 hours.  Intubation.  Endotracheal tube terminates 3.1 cm above the carina. Nasogastric extends beyond the  inferior aspect of the film.  Mildly degraded exam due to AP portable technique and patient body habitus.  Cardiomegaly accentuated by AP portable technique.  Mild right hemidiaphragm elevation. No pleural effusion or pneumothorax.  Low lung volumes with resultant pulmonary interstitial prominence. Difficult to exclude mild pulmonary venous congestion. No lobar consolidation.  IMPRESSION: Appropriate position of endotracheal tube.  Cardiomegaly and low lung volumes, without definite acute disease.   Original Report Authenticated By: Jeronimo Greaves, M.D.   Dg Chest Port 1 View  06/07/2012   *RADIOLOGY REPORT*  Clinical Data: Respiratory failure  PORTABLE CHEST - 1 VIEW  Comparison: 06/06/2012  Findings: No areas of lung consolidation/infiltrate.  No evidence of pulmonary edema.  No pleural effusion or pneumothorax.  There are prominent bronchovascular markings that are stable.  The cardiac silhouette is normal in size and configuration.  No mediastinal or hilar mass.  Left subclavian central venous line tip is in the mid to lower superior vena cava, stable.  IMPRESSION: No acute findings.  Stable appearance of the chest from the previous day's study, with no evidence of an infiltrate or pulmonary edema.   Original Report Authenticated By: Amie Portland, M.D.   Dg Chest  Port 1 View  06/06/2012   *RADIOLOGY REPORT*  Clinical Data: Evaluate central line placement.  PORTABLE CHEST - 1 VIEW  Comparison: 06/06/2012  Findings: Left subclavian central line tip is in the SVC region. There is no evidence for a pneumothorax.  There continues to be slightly low lung volumes with mild elevation of the right hemidiaphragm.  Heart size is stable.  No focal airspace disease.  IMPRESSION: Central line tip in the SVC region.  No evidence for a pneumothorax.   Original Report Authenticated By: Richarda Overlie, M.D.   Dg Chest Portable 1 View  06/06/2012   *RADIOLOGY REPORT*  Clinical Data: Sepsis.  Emesis.  Diarrhea.  Nausea.  PORTABLE CHEST - 1 VIEW  Comparison: None.  Findings: 2 frontal views.  Moderate right hemidiaphragm elevation. Cardiomegaly accentuated by AP portable technique.  No pleural effusion or pneumothorax.  Low lung volumes with resultant pulmonary interstitial prominence.  Patchy bibasilar atelectasis.  IMPRESSION: Cardiomegaly and low lung volumes with mild bibasilar atelectasis. No acute findings.  If high clinical concern of acute cardiopulmonary disease, consider PA and lateral radiographs.   Original Report Authenticated By: Jeronimo Greaves, M.D.   Dg Abd Portable 1v  06/07/2012   *RADIOLOGY REPORT*  Clinical Data: Oral gastric tube placement  PORTABLE ABDOMEN - 1 VIEW  Comparison: Abdomen and pelvis CT, 06/06/2012  Findings: The orogastric tube extends below the diaphragm to curl in the proximal stomach.  There is a normal bowel gas pattern.  The included soft tissues are unremarkable.  Degenerative changes are noted along the spine.  IMPRESSION: Orogastric tube is well positioned in the proximal stomach.   Original Report Authenticated By: Amie Portland, M.D.   Recent Results (from the past 240 hour(s))  CULTURE, BLOOD (ROUTINE X 2)     Status: None   Collection Time    06/06/12 11:30 AM      Result Value  Range Status   Specimen Description BLOOD RIGHT ANTECUBITAL    Final   Special Requests BOTTLES DRAWN AEROBIC AND ANAEROBIC 10CC   Final   Culture  Setup Time 06/06/2012 18:25   Final   Culture     Final   Value: STAPHYLOCOCCUS SPECIES (COAGULASE NEGATIVE)     Note: Gram Stain Report Called to,Read Back By and Verified With: [facility] 06/07/12 @ 12:45PM BY RUSCA.   Report Status PENDING   Incomplete  CULTURE, BLOOD (ROUTINE X 2)     Status: None   Collection Time    06/06/12 11:45 AM      Result Value Range Status   Specimen Description BLOOD RIGHT ANTECUBITAL   Final   Special Requests BOTTLES DRAWN AEROBIC ONLY 5CC   Final   Culture  Setup Time 06/06/2012 18:25   Final   Culture     Final   Value: STAPHYLOCOCCUS SPECIES (COAGULASE NEGATIVE)     Note: RIFAMPIN AND GENTAMICIN SHOULD NOT BE USED AS SINGLE DRUGS FOR TREATMENT OF STAPH INFECTIONS.     Note: Gram Stain Report Called to,Read Back By and Verified With: KATRICE KELLER 06/07/12 @ 5:38PM BY RUSCA.   Report Status PENDING   Incomplete  URINE CULTURE     Status: None   Collection Time    06/06/12 11:59 AM      Result Value Range Status   Specimen Description URINE, CATHETERIZED   Final   Special Requests NONE   Final   Culture  Setup Time 06/06/2012 19:34   Final   Colony Count NO GROWTH   Final   Culture NO GROWTH   Final   Report Status 06/07/2012 FINAL   Final  MRSA PCR SCREENING     Status: Abnormal   Collection Time    06/06/12  2:01 PM      Result Value Range Status   MRSA by PCR POSITIVE (*) NEGATIVE Final   Comment:            The GeneXpert MRSA Assay (FDA     approved for NASAL specimens     only), is one component of a     comprehensive MRSA colonization     surveillance program. It is not     intended to diagnose MRSA     infection nor to guide or     monitor treatment for     MRSA infections.     RESULT CALLED TO, READ BACK BY AND VERIFIED WITH:     R. FOUNTAIN RN 16:45 06/06/12 ([email or web address])  URINE CULTURE     Status: None   Collection Time    06/06/12  4:59 PM       Result Value Range Status   Specimen Description URINE, CATHETERIZED   Final   Special Requests Normal   Final   Culture  Setup Time 06/06/2012 18:12   Final   Colony Count NO GROWTH   Final   Culture NO GROWTH   Final   Report Status 06/07/2012 FINAL   Final  CULTURE, RESPIRATORY (NON-EXPECTORATED)     Status: None   Collection Time    06/07/12 11:44 AM      Result Value Range Status   Specimen Description TRACHEAL ASPIRATE   Final   Special Requests NONE   Final   Gram Stain PENDING   Incomplete   Culture     Final   Value: FEW FUNGUS (MOLD) ISOLATED, PROBABLE CONTAMINANT/COLONIZER (SAPROPHYTE). CONTACT MICROBIOLOGY IF FURTHER IDENTIFICATION REQUIRED  [phone].   Report Status PENDING   Incomplete  CLOSTRIDIUM DIFFICILE BY PCR     Status: None   Collection Time    06/07/12  3:41 PM      Result Value Range Status   C difficile by pcr NEGATIVE  NEGATIVE Final      06/08/2012, 12:12 PM     LOS: 2 days

## 2012-06-09 ENCOUNTER — Inpatient Hospital Stay (HOSPITAL_COMMUNITY): Payer: Medicare Other

## 2012-06-09 DIAGNOSIS — A4101 Sepsis due to Methicillin susceptible Staphylococcus aureus: Secondary | ICD-10-CM

## 2012-06-09 DIAGNOSIS — E119 Type 2 diabetes mellitus without complications: Secondary | ICD-10-CM

## 2012-06-09 DIAGNOSIS — R6520 Severe sepsis without septic shock: Secondary | ICD-10-CM

## 2012-06-09 DIAGNOSIS — R4182 Altered mental status, unspecified: Secondary | ICD-10-CM

## 2012-06-09 DIAGNOSIS — N179 Acute kidney failure, unspecified: Secondary | ICD-10-CM

## 2012-06-09 DIAGNOSIS — J9600 Acute respiratory failure, unspecified whether with hypoxia or hypercapnia: Secondary | ICD-10-CM

## 2012-06-09 DIAGNOSIS — R197 Diarrhea, unspecified: Secondary | ICD-10-CM

## 2012-06-09 DIAGNOSIS — A4902 Methicillin resistant Staphylococcus aureus infection, unspecified site: Secondary | ICD-10-CM

## 2012-06-09 DIAGNOSIS — A419 Sepsis, unspecified organism: Secondary | ICD-10-CM

## 2012-06-09 DIAGNOSIS — M311 Thrombotic microangiopathy: Secondary | ICD-10-CM

## 2012-06-09 DIAGNOSIS — R7881 Bacteremia: Secondary | ICD-10-CM

## 2012-06-09 LAB — EHRLICHIA ANTIBODY PANEL
E chaffeensis (HGE) Ab, IgG: NEGATIVE
E chaffeensis (HGE) Ab, IgM: NEGATIVE

## 2012-06-09 LAB — THERAPEUTIC PLASMA EXCHANGE (BLOOD BANK)
Plasma volume needed: 1
Unit division: 0
Unit division: 0
Unit division: 0
Unit division: 0
Unit division: 0
Unit division: 0
Unit division: 0
Unit division: 0

## 2012-06-09 LAB — GLUCOSE, CAPILLARY
Glucose-Capillary: 241 mg/dL — ABNORMAL HIGH (ref 70–99)
Glucose-Capillary: 206 mg/dL — ABNORMAL HIGH (ref 70–99)
Glucose-Capillary: 188 mg/dL — ABNORMAL HIGH (ref 70–99)
Glucose-Capillary: 152 mg/dL — ABNORMAL HIGH (ref 70–99)
Glucose-Capillary: 154 mg/dL — ABNORMAL HIGH (ref 70–99)
Glucose-Capillary: 152 mg/dL — ABNORMAL HIGH (ref 70–99)
Glucose-Capillary: 121 mg/dL — ABNORMAL HIGH (ref 70–99)
Glucose-Capillary: 180 mg/dL — ABNORMAL HIGH (ref 70–99)

## 2012-06-09 LAB — BLOOD GAS, ARTERIAL
Acid-Base Excess: 11.2 mmol/L — ABNORMAL HIGH (ref 0.0–2.0)
Drawn by: 252031
FIO2: 0.4 %
MECHVT: 600 mL
O2 Saturation: 98.7 %
Patient temperature: 98.6
RATE: 15 resp/min

## 2012-06-09 LAB — RENAL FUNCTION PANEL
CO2: 36 mEq/L — ABNORMAL HIGH (ref 19–32)
Calcium: 8.6 mg/dL (ref 8.4–10.5)
GFR calc Af Amer: 21 mL/min — ABNORMAL LOW (ref >90)
Glucose, Bld: 161 mg/dL — ABNORMAL HIGH (ref 70–99)
Potassium: 3 mEq/L — ABNORMAL LOW (ref 3.5–5.1)
Sodium: 143 mEq/L (ref 135–145)

## 2012-06-09 LAB — BASIC METABOLIC PANEL
BUN: 77 mg/dL — ABNORMAL HIGH (ref 6–23)
Calcium: 9.3 mg/dL (ref 8.4–10.5)
GFR calc Af Amer: 30 mL/min — ABNORMAL LOW (ref >90)
GFR calc non Af Amer: 26 mL/min — ABNORMAL LOW (ref >90)
Glucose, Bld: 181 mg/dL — ABNORMAL HIGH (ref 70–99)
Potassium: 2.8 mEq/L — ABNORMAL LOW (ref 3.5–5.1)
Sodium: 150 mEq/L — ABNORMAL HIGH (ref 135–145)

## 2012-06-09 LAB — PREPARE FRESH FROZEN PLASMA: Unit division: 0

## 2012-06-09 LAB — CBC
MCH: 27.3 pg (ref 26.0–34.0)
MCHC: 34 g/dL (ref 30.0–36.0)
Platelets: 40 10*3/uL — ABNORMAL LOW (ref 150–400)

## 2012-06-09 LAB — PREPARE RBC (CROSSMATCH)

## 2012-06-09 LAB — MAGNESIUM: Magnesium: 2.1 mg/dL (ref 1.5–2.5)

## 2012-06-09 MED ORDER — ACETAMINOPHEN 325 MG PO TABS
650.0000 mg | ORAL_TABLET | Freq: Once | ORAL | Status: AC
  Administered 2012-06-09: 650 mg via ORAL

## 2012-06-09 MED ORDER — POTASSIUM CHLORIDE 20 MEQ/15ML (10%) PO LIQD
40.0000 meq | Freq: Three times a day (TID) | ORAL | Status: DC
  Administered 2012-06-09 (×2): 40 meq
  Filled 2012-06-09 (×3): qty 30

## 2012-06-09 MED ORDER — INSULIN GLARGINE 100 UNIT/ML ~~LOC~~ SOLN
35.0000 [IU] | Freq: Every day | SUBCUTANEOUS | Status: DC
  Administered 2012-06-09 – 2012-06-11 (×3): 35 [IU] via SUBCUTANEOUS
  Filled 2012-06-09 (×5): qty 0.35

## 2012-06-09 MED ORDER — PANTOPRAZOLE SODIUM 40 MG PO PACK
40.0000 mg | PACK | Freq: Every day | ORAL | Status: DC
  Administered 2012-06-09 – 2012-06-11 (×3): 40 mg
  Filled 2012-06-09 (×4): qty 20

## 2012-06-09 MED ORDER — SODIUM CHLORIDE 0.9 % IV SOLN
4.0000 g | Freq: Once | INTRAVENOUS | Status: AC
  Administered 2012-06-09: 4 g via INTRAVENOUS
  Filled 2012-06-09 (×2): qty 40

## 2012-06-09 MED ORDER — FUROSEMIDE 10 MG/ML IJ SOLN
40.0000 mg | Freq: Four times a day (QID) | INTRAMUSCULAR | Status: AC
  Administered 2012-06-09 – 2012-06-10 (×3): 40 mg via INTRAVENOUS
  Filled 2012-06-09 (×3): qty 4

## 2012-06-09 MED ORDER — DIPHENHYDRAMINE HCL 50 MG/ML IJ SOLN
25.0000 mg | Freq: Once | INTRAMUSCULAR | Status: AC
  Administered 2012-06-09: 25 mg via INTRAVENOUS

## 2012-06-09 MED ORDER — DIPHENHYDRAMINE HCL 25 MG PO CAPS: 25.0000 mg | ORAL_CAPSULE | Freq: Four times a day (QID) | ORAL | Status: DC | PRN

## 2012-06-09 MED ORDER — ANTICOAGULANT SODIUM CITRATE 4% (200MG/5ML) IV SOLN
5.0000 mL | Freq: Once | Status: AC
  Administered 2012-06-09: 5 mL
  Filled 2012-06-09 (×2): qty 250

## 2012-06-09 MED ORDER — ACD FORMULA A 0.73-2.45-2.2 GM/100ML VI SOLN
500.0000 mL | Status: DC
  Administered 2012-06-09: 500 mL via INTRAVENOUS
  Filled 2012-06-09: qty 500

## 2012-06-09 MED ORDER — CALCIUM GLUCONATE 10 % IV SOLN
2.0000 g | Freq: Once | INTRAVENOUS | Status: DC
  Filled 2012-06-09: qty 20

## 2012-06-09 MED ORDER — METHYLPREDNISOLONE SODIUM SUCC 125 MG IJ SOLR
80.0000 mg | Freq: Once | INTRAMUSCULAR | Status: DC
  Filled 2012-06-09: qty 1.28

## 2012-06-09 MED ORDER — INSULIN ASPART 100 UNIT/ML ~~LOC~~ SOLN
6.0000 [IU] | SUBCUTANEOUS | Status: DC
  Administered 2012-06-09 – 2012-06-11 (×13): 6 [IU] via SUBCUTANEOUS

## 2012-06-09 MED ORDER — INSULIN ASPART 100 UNIT/ML ~~LOC~~ SOLN
1.0000 [IU] | SUBCUTANEOUS | Status: DC
  Administered 2012-06-09 (×4): 2 [IU] via SUBCUTANEOUS
  Administered 2012-06-10: 1 [IU] via SUBCUTANEOUS
  Administered 2012-06-10 (×3): 2 [IU] via SUBCUTANEOUS
  Administered 2012-06-10 – 2012-06-11 (×2): 3 [IU] via SUBCUTANEOUS
  Administered 2012-06-11: 2 [IU] via SUBCUTANEOUS
  Administered 2012-06-11: 1 [IU] via SUBCUTANEOUS
  Administered 2012-06-11: 3 [IU] via SUBCUTANEOUS
  Administered 2012-06-11 (×2): 2 [IU] via SUBCUTANEOUS
  Administered 2012-06-12 (×4): 1 [IU] via SUBCUTANEOUS
  Administered 2012-06-12: 2 [IU] via SUBCUTANEOUS
  Administered 2012-06-13: 1 [IU] via SUBCUTANEOUS

## 2012-06-09 MED ORDER — POTASSIUM CHLORIDE 20 MEQ/15ML (10%) PO LIQD
40.0000 meq | Freq: Once | ORAL | Status: DC
  Filled 2012-06-09: qty 30

## 2012-06-09 MED ORDER — DEXTROSE 10 % IV SOLN
INTRAVENOUS | Status: DC | PRN
  Administered 2012-06-10: 1000 mL via INTRAVENOUS

## 2012-06-09 MED ORDER — ACETAMINOPHEN 325 MG PO TABS: 650.0000 mg | ORAL_TABLET | ORAL | Status: DC | PRN

## 2012-06-09 MED FILL — Anticoagulant Citrate Dextrose Solution A: Qty: 500 | Status: AC

## 2012-06-09 NOTE — Progress Notes (Signed)
UR Completed.  Bernadene Garside Jane [phone] 06/09/2012  

## 2012-06-09 NOTE — Progress Notes (Addendum)
PULMONARY  / CRITICAL CARE MEDICINE  Name: Brett [name] MRN: [id number] DOB: [birth year]    ADMISSION DATE:  06/06/2012 CONSULTATION DATE:  06/06/2012  REFERRING MD :  EDP PRIMARY SERVICE: PCCM  CHIEF COMPLAINT:  Tired and SOB  BRIEF PATIENT DESCRIPTION: 67 year old male admitted on 5/30 w/ 4 d h/o non-bloody diarrhea. Patient continued to take his ACE and pradaxa.  Presents to the ED with BP in the 60's after 3 liters of fluid.  PCCM called to admit as a code sepsis.  SIGNIFICANT EVENTS / STUDIES:  5/30 Septic shock, admit to ICU.  LINES / TUBES: L [place] TLC 5/30>>> R a-line 5/30>>> ET tube 5/31>>> L femoral trialysis catheter 5/30>>>  CULTURES: Blood 5/30>>>Coag neg staph Urine 5/30>>>neg Sputum 5/30>>>Mold  ANTIBIOTICS: Vanc 5/30>>> Zosyn 5/30>>> 6/2 Doxycycline 5/31>>>  SUBJECTIVE: Tired, diarrhea.  VITAL SIGNS: Temp:  [92.3 F (33.5 C)-98.9 F (37.2 C)] 97.7 F (36.5 C) (06/02 1010) Pulse Rate:  [88-111] 106 (06/02 1010) Resp:  [8-26] 23 (06/02 1010) BP: (95-144)/(51-75) 122/70 mmHg (06/02 1000) SpO2:  [99 %-100 %] 100 % (06/02 0857) Arterial Line BP: (90-145)/(47-80) 145/73 mmHg (06/02 1010) FiO2 (%):  [40 %] 40 % (06/02 0857) Weight:  [137.6 kg (303 lb 5.7 oz)] 137.6 kg (303 lb 5.7 oz) (06/02 0500) HEMODYNAMICS: CVP:  [10 mmHg-13 mmHg] 13 mmHg VENTILATOR SETTINGS: Vent Mode:  [-] CPAP;PSV FiO2 (%):  [40 %] 40 % Set Rate:  [15 bmp] 15 bmp Vt Set:  [600 mL] 600 mL PEEP:  [5 cmH20] 5 cmH20 Pressure Support:  [10 cmH20] 10 cmH20 Plateau Pressure:  [16 cmH20-20 cmH20] 20 cmH20 INTAKE / OUTPUT: Intake/Output     06/01 0701 - 06/02 0700 06/02 0701 - 06/03 0700   I.V. (mL/kg) 1401.9 (10.2)    Blood 1653 350   Other     NG/GT 1080    IV Piggyback 2345    Total Intake(mL/kg) 6479.9 (47.1) 350 (2.5)   Urine (mL/kg/hr) 2665 (0.8)    Stool 400 (0.1)    Total Output 3065     Net +3414.9 +350         PHYSICAL EXAMINATION: General: Chronically ill  appearing obese male, sedated and intubated. Neuro: Sedate but withdraws to pain. HEENT:  [place]/AT, PERRL, EOM-I and DMM. Cardiovascular:  IRIR, Nl S1/S2, -M/R/G. Lungs:  CTA bilaterally. Abdomen:  Soft, NT, ND and +BS. Musculoskeletal:  -edema and -tenderness. Skin:  Both lower ext with multiple lesions.  LABS:  Recent Labs Lab 06/08/12 0353 06/08/12 1600 06/09/12 0421  NA 138 143 143  K 3.0* 2.8* 3.0*  CL 94* 97 99  CO2 30 34* 36*  BUN 85* 80* 83*  CREATININE 4.83* 3.64* 3.25*  GLUCOSE 219* 256* 161*   Recent Labs Lab 06/08/12 0353 06/08/12 1200 06/09/12 0421  HGB 5.6* 6.7* 7.0*  HCT 16.3* 19.7* 20.6*  WBC 2.3* 4.1 3.3*  PLT 32* 44* 40*   Recent Labs Lab 06/06/12 1122 06/06/12 1319 06/06/12 2233  ALT 93* 71* 71*  AST 178* 142* 144*  ALKPHOS 51 38* 45  BILITOT 0.9 0.7 0.6   CXR: Low lung volumes   ASSESSMENT / PLAN:  PULMONARY A:   Respiratory failure due to poor mental status.  OSA by history. P:   - Maintain on full vent support. - F/U CXR and ABG. - After diureses will begin PS trials.  CARDIOVASCULAR A:  Shock likely due to hypovolemia-->now resolved no indication of infection at this time other than possibly  the legs with cellulitis.   A-fib with RVR and HTN.-->now ST P:  - See ID section. - CVPs as ordered. - KVO IVF.  RENAL A:  ARF, no history of renal disease.  Likely a combination of dehydration and ACE use. Did get 1 round of HD, now Creatinine improving and UOP acceptable.  Fluid and electrolyte imbalance/ hypokalemia  P:   - KVO IVF. - Monitor lytes closely. Replace as indicated - Diureses as BP permits.  GASTROINTESTINAL A:  Diarrhea. P:   - Stool culture pending. - Continue TF.  HEMATOLOGIC A:   Thrombocytopenia of unknown cause.  ?TTP, ?HSP.  Hemorrhagic anemia: suspect multifactorial hemolysis: TTP and possibly plasmapheresis.  P:  - Peripheral smear per H/O. - Stool culture pending. - Plasmapheresis per  H/O.  INFECTIOUS A:  Likely bowel or cellulitis. P:   - Cultures as above. - Abx as above.  ENDOCRINE A:  ?DM, none by history.   P:   - ISS.  NEUROLOGIC A: acute encephalopathy.  Non-focal.  ?uremia. P:   - Head CT without contrast negative. - Intermittent sedation.  TODAY'S SUMMARY: Continue plasmapheresis for TTP. Hypokalemia this morning, will replete electrolytes.  Abx for infection.  I have personally obtained a history, examined the patient, evaluated laboratory and imaging results, formulated the assessment and plan and placed orders.  CRITICAL CARE: The patient is critically ill with multiple organ systems failure and requires high complexity decision making for assessment and support, frequent evaluation and titration of therapies, application of advanced monitoring technologies and extensive interpretation of multiple databases. Critical Care Time devoted to patient care services described in this note is 35 minutes.   06/09/2012, 11:13 AM

## 2012-06-09 NOTE — Progress Notes (Signed)
CRITICAL VALUE ALERT  Critical value received:  CO2  Date of notification:  06/09/2012   Time of notification:  0950  Critical value read back:yes  Nurse who received alert:  Maryelizabeth Kaufmann RN   MD notified (1st page):  [phone]  Time of first page:  Pola Corn RN notified  MD notified (2nd page):  Time of second page:  Responding MD:    Time MD responded:

## 2012-06-09 NOTE — Progress Notes (Signed)
Patient ID: Brett [name], male   DOB: 12/28/1945, 67 y.o.   MRN: [id number] S:Pt is intubated but awake/alert and communicative O:BP 144/75  Pulse 111  Temp(Src) 97.4 F (36.3 C) (Core (Comment))  Resp 14  Ht 5\' 11"  (1.803 m)  Wt 137.6 kg (303 lb 5.7 oz)  BMI 42.33 kg/m2  SpO2 100%  Intake/Output Summary (Last 24 hours) at 06/09/12 0907 Last data filed at 06/09/12 0600  Gross per 24 hour  Intake 5581.14 ml  Output   2765 ml  Net 2816.14 ml   Intake/Output: I/O last 3 completed shifts: In: 9274.1 [I.V.:2661.9; Blood:2337.2; Other:30; NG/GT:1550; IV Piggyback:2695] Out: 4260 [Urine:3660; Stool:600]  Intake/Output this shift:    Weight change: 0.2 kg (7.1 oz) Gen:WD obese WM intubated but awake/alert ION:GEXBM, no rub Resp:scattered rhonchi WUX:LKGMW, +BS, soft, NT Ext:tr edema,    Recent Labs Lab 06/06/12 1122 06/06/12 1319 06/06/12 2233 06/07/12 0354 06/07/12 0430 06/08/12 0353 06/08/12 1600 06/09/12 0421  NA 125* 128* 134* 134* 135 138 143 143  K 3.9 3.4* 3.6 3.1* 3.2* 3.0* 2.8* 3.0*  CL 88* 98 96 95* 97 94* 97 99  CO2 13* 10* 16* 20 20 30  34* 36*  GLUCOSE 153* 132* 192* 172* 161* 219* 256* 161*  BUN 124* 117* 124* 130* 129* 85* 80* 83*  CREATININE 11.11* 10.03* 9.60* 9.46* 9.46* 4.83* 3.64* 3.25*  ALBUMIN 2.9* 2.2* 2.4*  --  2.3*  --   --  2.5*  CALCIUM 8.1* 6.6* 7.2* 7.1* 7.1* 7.5* 9.0 8.6  PHOS  --   --   --  5.5* 5.5* 4.5  --  4.0  AST 178* 142* 144*  --   --   --   --   --   ALT 93* 71* 71*  --   --   --   --   --    Liver Function Tests:  Recent Labs Lab 06/06/12 1122 06/06/12 1319 06/06/12 2233 06/07/12 0430 06/09/12 0421  AST 178* 142* 144*  --   --   ALT 93* 71* 71*  --   --   ALKPHOS 51 38* 45  --   --   BILITOT 0.9 0.7 0.6  --   --   PROT 7.1 5.2* 5.6*  --   --   ALBUMIN 2.9* 2.2* 2.4* 2.3* 2.5*   No results found for this basename: LIPASE, AMYLASE,  in the last 168 hours No results found for this basename: AMMONIA,  in the last  168 hours CBC:  Recent Labs Lab 06/07/12 1045 06/07/12 1525 06/08/12 0353 06/08/12 1200 06/09/12 0421  WBC 4.3 2.2* 2.3* 4.1 3.3*  NEUTROABS  --   --   --  2.7  --   HGB 9.4* 7.3* 5.6* 6.7* 7.0*  HCT 26.5* 20.7* 16.3* 19.7* 20.6*  MCV 75.7* 75.8* 77.6* 78.8 80.5  PLT 44* 35* 32* 44* 40*   Cardiac Enzymes:  Recent Labs Lab 06/06/12 1132 06/06/12 1319 06/06/12 2241 06/07/12 0354 06/07/12 1041  TROPONINI <0.30 <0.30 <0.30 <0.30 <0.30   CBG:  Recent Labs Lab 06/09/12 0205 06/09/12 0305 06/09/12 0354 06/09/12 0508 06/09/12 0602  GLUCAP 146* 154* 161* 152* 151*    Iron Studies: No results found for this basename: IRON, TIBC, TRANSFERRIN, FERRITIN,  in the last 72 hours Studies/Results: Dg Chest Port 1 View  06/09/2012   *RADIOLOGY REPORT*  Clinical Data: Endotracheal tube positioning  PORTABLE CHEST - 1 VIEW  Comparison: Chest radiograph 06/08/2012  Findings: Endotracheal tube is  5.5 cm from carina.  NG tube extends to the stomach.  Left PICC line is unchanged.  Stable enlarged heart silhouette.  There are low lung volumes and mild basilar atelectasis.  No pulmonary edema.  IMPRESSION: 1.  Support apparatus in good position. 2.  Cardiomegaly and low lung volumes.   Original Report Authenticated By: Genevive Bi, M.D.   Dg Chest Port 1 View  06/08/2012   *RADIOLOGY REPORT*  Clinical Data: Intubated  PORTABLE CHEST - 1 VIEW  Comparison:   the previous day's study  Findings: Endotracheal tube has been repositioned, tip now approximately 7 cm above carina.  Nasogastric tube and left subclavian central line are stable in position.  Heart size upper limits normal.  Low lung volumes with some coarse airspace opacities in the left mid lung and both lung bases, slightly increased.  No definite effusion.  IMPRESSION:  1.  Repositioned endotracheal tube with slight increase in asymmetric infiltrates or atelectasis.   Original Report Authenticated By: D. Andria Rhein, MD   Dg Chest Port  1 View  06/07/2012   *RADIOLOGY REPORT*  Clinical Data: Endotracheal tube placement.  PORTABLE CHEST - 1 VIEW  Comparison: 06/07/2012 at 5:40 hours  Findings: 1120 hours.  Intubation.  Endotracheal tube terminates 3.1 cm above the carina. Nasogastric extends beyond the  inferior aspect of the film.  Mildly degraded exam due to AP portable technique and patient body habitus.  Cardiomegaly accentuated by AP portable technique.  Mild right hemidiaphragm elevation. No pleural effusion or pneumothorax.  Low lung volumes with resultant pulmonary interstitial prominence. Difficult to exclude mild pulmonary venous congestion. No lobar consolidation.  IMPRESSION: Appropriate position of endotracheal tube.  Cardiomegaly and low lung volumes, without definite acute disease.   Original Report Authenticated By: Jeronimo Greaves, M.D.   Dg Abd Portable 1v  06/07/2012   *RADIOLOGY REPORT*  Clinical Data: Oral gastric tube placement  PORTABLE ABDOMEN - 1 VIEW  Comparison: Abdomen and pelvis CT, 06/06/2012  Findings: The orogastric tube extends below the diaphragm to curl in the proximal stomach.  There is a normal bowel gas pattern.  The included soft tissues are unremarkable.  Degenerative changes are noted along the spine.  IMPRESSION: Orogastric tube is well positioned in the proximal stomach.   Original Report Authenticated By: Amie Portland, M.D.   . antiseptic oral rinse  15 mL Mouth Rinse QID  . chlorhexidine  15 mL Mouth Rinse BID  . Chlorhexidine Gluconate Cloth  6 each Topical Q0600  . doxycycline (VIBRAMYCIN) IV  100 mg Intravenous Q12H  . feeding supplement  60 mL Per Tube TID  . hydrocortisone sodium succinate  50 mg Intravenous Q6H  . insulin aspart  6 Units Subcutaneous Q4H  . insulin glargine  35 Units Subcutaneous Daily  . mupirocin ointment  1 application Nasal BID  . norepinephrine (LEVOPHED) Adult infusion  2-50 mcg/min Intravenous Once  . pantoprazole (PROTONIX) IV  40 mg Intravenous Q24H  .  piperacillin-tazobactam (ZOSYN)  IV  2.25 g Intravenous Q6H  . vancomycin  1,750 mg Intravenous Q48H    BMET    Component Value Date/Time   NA 143 06/09/2012 0421   K 3.0* 06/09/2012 0421   CL 99 06/09/2012 0421   CO2 36* 06/09/2012 0421   GLUCOSE 161* 06/09/2012 0421   BUN 83* 06/09/2012 0421   CREATININE 3.25* 06/09/2012 0421   CALCIUM 8.6 06/09/2012 0421   GFRNONAA 18* 06/09/2012 0421   GFRAA 21* 06/09/2012 0421   CBC  Component Value Date/Time   WBC 3.3* 06/09/2012 0421   RBC 2.56* 06/09/2012 0421   HGB 7.0* 06/09/2012 0421   HCT 20.6* 06/09/2012 0421   PLT 40* 06/09/2012 0421   MCV 80.5 06/09/2012 0421   MCH 27.3 06/09/2012 0421   MCHC 34.0 06/09/2012 0421   RDW 14.9 06/09/2012 0421   LYMPHSABS 1.2 06/08/2012 1200   MONOABS 0.2 06/08/2012 1200   EOSABS 0.0 06/08/2012 1200   BASOSABS 0.0 06/08/2012 1200     Assessment/Plan:  1. AKI- non-oliguric.  S/p one HD session on 06/07/12 mainly for pradaxa drug clearance.  Scr has continued to improve since admission and pt has been undergoing daily plasmapheresis for presumed TTP.  Cont with current care as he appears to be responding. 2. HUS/TTP- Hematology following and coordinating plasmapheresis.  No sig change in platelets, however does not appear to be bleeding this am. 3. ABLA- as above 4. SIRS- MRSE+blood cultures. On vanco. ID following. On vanco/zosyn.  Source of infection unclear ?osteo 5. Diarrhea- C diff negative 6. VDRF- per PCCM 7. AMS- improving 8. A fib- off of pradaxa 9. Coagulopathy- as above 10. Metabolic alkalosis- follow  Dimarco Minkin A

## 2012-06-09 NOTE — Consult Note (Signed)
[facility] INPATIENT PROGRESS NOTE  Name: Brett [name]      MRN: [id number]    Location: 2112/2112-01  Date: 06/09/2012 Time:12:02 PM   Subjective: Interval History:Brett [name] remained intubated but was alert.  He motioned with his head that he is doing OK.  He has pruritus since starting phoresis but not active skin rash beside his chronic rash in the lower extremities for years.  He diffuse bleeding from nose/mouth/catheters/Foley cath have resolved.    Objective: Vital signs in last 24 hours: Temp:  [92.3 F (33.5 C)-98.9 F (37.2 C)] 97.7 F (36.5 C) (06/02 1010) Pulse Rate:  [88-111] 106 (06/02 1010) Resp:  [8-26] 23 (06/02 1010) BP: (95-144)/(51-75) 122/70 mmHg (06/02 1000) SpO2:  [99 %-100 %] 100 % (06/02 0857) Arterial Line BP: (90-145)/(47-80) 145/73 mmHg (06/02 1010) FiO2 (%):  [40 %] 40 % (06/02 0857) Weight:  [303 lb 5.7 oz (137.6 kg)] 303 lb 5.7 oz (137.6 kg) (06/02 0500)     PHYSICAL EXAM:  General: Obese man, no longer in rigour.  Eyes: no scleral icterus. ENT: There was dry blood his oropharynx. There was dry blood in his nose. Lymphatics: Negative cervical, supraclavicular or axillary adenopathy. Respiratory: lungs showed bibasilar crackles.  Cardiovascular: Regular rate and rhythm, S1/S2, without murmur, rub or gallop. There was no pedal edema. GI: abdomen was soft, flat, nontender, nondistended, without organomegaly. There was no blood in his Foley cath.  There was bruising from old IV sites and blood draw.   Studies/Results: Results for orders placed during the hospital encounter of 06/06/12 (from the past 48 hour(s))  PROTIME-INR     Status: Abnormal   Collection Time    06/07/12  3:25 PM      Result Value Range   Prothrombin Time 21.3 (*) 11.6 - 15.2 seconds   INR 1.93 (*) 0.00 - 1.49  CBC     Status: Abnormal   Collection Time    06/07/12  3:25 PM      Result Value Range   WBC 2.2 (*) 4.0 - 10.5 K/uL   RBC 2.73 (*) 4.22 - 5.81  MIL/uL   Hemoglobin 7.3 (*) 13.0 - 17.0 g/dL   Comment: REPEATED TO VERIFY     DELTA CHECK NOTED   HCT 20.7 (*) 39.0 - 52.0 %   MCV 75.8 (*) 78.0 - 100.0 fL   MCH 26.7  26.0 - 34.0 pg   MCHC 35.3  30.0 - 36.0 g/dL   RDW 40.9  81.1 - 91.4 %   Platelets 35 (*) 150 - 400 K/uL   Comment: REPEATED TO VERIFY     CONSISTENT WITH PREVIOUS RESULT  CLOSTRIDIUM DIFFICILE BY PCR     Status: None   Collection Time    06/07/12  3:41 PM      Result Value Range   C difficile by pcr NEGATIVE  NEGATIVE  GLUCOSE, CAPILLARY     Status: Abnormal   Collection Time    06/07/12  3:48 PM      Result Value Range   Glucose-Capillary 183 (*) 70 - 99 mg/dL  PREPARE FRESH FROZEN PLASMA     Status: None   Collection Time    06/07/12  5:23 PM      Result Value Range   Unit Number [id number]     Blood Component Type THAWED PLASMA     Unit division 00     Status of Unit ISSUED,FINAL     Transfusion Status OK  TO TRANSFUSE     Unit Number [id number]     Blood Component Type THAWED PLASMA     Unit division 00     Status of Unit ISSUED,FINAL     Transfusion Status OK TO TRANSFUSE    PREPARE PLATELET PHERESIS     Status: None   Collection Time    06/07/12  5:23 PM      Result Value Range   Unit Number [id number]     Blood Component Type PLTPHER LR2     Unit division 00     Status of Unit ISSUED,FINAL     Transfusion Status OK TO TRANSFUSE    GLUCOSE, CAPILLARY     Status: Abnormal   Collection Time    06/07/12  7:41 PM      Result Value Range   Glucose-Capillary 175 (*) 70 - 99 mg/dL   Comment 1 Notify RN    GLUCOSE, CAPILLARY     Status: Abnormal   Collection Time    06/08/12 12:08 AM      Result Value Range   Glucose-Capillary 194 (*) 70 - 99 mg/dL   Comment 1 Notify RN    BLOOD GAS, ARTERIAL     Status: Abnormal   Collection Time    06/08/12  3:51 AM      Result Value Range   FIO2 0.50     Delivery systems VENTILATOR     Mode PRESSURE REGULATED VOLUME CONTROL     VT 600     Rate  15     Peep/cpap 5.0     pH, Arterial 7.488 (*) 7.350 - 7.450   pCO2 arterial 39.8  35.0 - 45.0 mmHg   pO2, Arterial 201.0 (*) 80.0 - 100.0 mmHg   Bicarbonate 29.6 (*) 20.0 - 24.0 mEq/L   TCO2 30.7  0 - 100 mmol/L   Acid-Base Excess 6.2 (*) 0.0 - 2.0 mmol/L   O2 Saturation 99.9     Patient temperature 100.5     Collection site A-LINE     Drawn by [id number]     Sample type ARTERIAL DRAW     Allens test (pass/fail) PASS  PASS  CBC     Status: Abnormal   Collection Time    06/08/12  3:53 AM      Result Value Range   WBC 2.3 (*) 4.0 - 10.5 K/uL   RBC 2.10 (*) 4.22 - 5.81 MIL/uL   Hemoglobin 5.6 (*) 13.0 - 17.0 g/dL   Comment: REPEATED TO VERIFY     CRITICAL RESULT CALLED TO, READ BACK BY AND VERIFIED WITH:     C HAYES,RN 0412 06/08/12 D BRADLEY   HCT 16.3 (*) 39.0 - 52.0 %   MCV 77.6 (*) 78.0 - 100.0 fL   MCH 26.7  26.0 - 34.0 pg   MCHC 34.4  30.0 - 36.0 g/dL   RDW 84.6  96.2 - 95.2 %   Platelets 32 (*) 150 - 400 K/uL   Comment: CONSISTENT WITH PREVIOUS RESULT  BASIC METABOLIC PANEL     Status: Abnormal   Collection Time    06/08/12  3:53 AM      Result Value Range   Sodium 138  135 - 145 mEq/L   Potassium 3.0 (*) 3.5 - 5.1 mEq/L   Chloride 94 (*) 96 - 112 mEq/L   CO2 30  19 - 32 mEq/L   Glucose, Bld 219 (*) 70 - 99 mg/dL   BUN 85 (*) 6 -  23 mg/dL   Comment: DELTA CHECK NOTED   Creatinine, Ser 4.83 (*) 0.50 - 1.35 mg/dL   Comment: DELTA CHECK NOTED   Calcium 7.5 (*) 8.4 - 10.5 mg/dL   GFR calc non Af Amer 11 (*) >90 mL/min   GFR calc Af Amer 13 (*) >90 mL/min   Comment:            The eGFR has been calculated     using the CKD EPI equation.     This calculation has not been     validated in all clinical     situations.     eGFR's persistently     <90 mL/min signify     possible Chronic Kidney Disease.  MAGNESIUM     Status: None   Collection Time    06/08/12  3:53 AM      Result Value Range   Magnesium 1.9  1.5 - 2.5 mg/dL  PHOSPHORUS     Status: None   Collection  Time    06/08/12  3:53 AM      Result Value Range   Phosphorus 4.5  2.3 - 4.6 mg/dL  APTT     Status: Abnormal   Collection Time    06/08/12  3:53 AM      Result Value Range   aPTT 57 (*) 24 - 37 seconds   Comment:            IF BASELINE aPTT IS ELEVATED,     SUGGEST PATIENT RISK ASSESSMENT     BE USED TO DETERMINE APPROPRIATE     ANTICOAGULANT THERAPY.  GLUCOSE, CAPILLARY     Status: Abnormal   Collection Time    06/08/12  3:59 AM      Result Value Range   Glucose-Capillary 203 (*) 70 - 99 mg/dL   Comment 1 Notify RN    GLUCOSE, CAPILLARY     Status: Abnormal   Collection Time    06/08/12  7:24 AM      Result Value Range   Glucose-Capillary 243 (*) 70 - 99 mg/dL  THERAPEUTIC PLASMA EXCHANGE     Status: None   Collection Time    06/08/12  8:30 AM      Result Value Range   Plasma Exchange ORDERED, THAWED     Plasma volume needed 1     Unit Number [id number]     Blood Component Type THAWED PLASMA     Unit division 00     Status of Unit ISSUED,FINAL     Transfusion Status OK TO TRANSFUSE     Unit Number [id number]     Blood Component Type THAWED PLASMA     Unit division 00     Status of Unit ISSUED,FINAL     Transfusion Status OK TO TRANSFUSE     Unit Number [id number]     Blood Component Type THAWED PLASMA     Unit division 00     Status of Unit ISSUED,FINAL     Transfusion Status OK TO TRANSFUSE     Unit Number [id number]     Blood Component Type THAWED PLASMA     Unit division 00     Status of Unit ISSUED,FINAL     Transfusion Status OK TO TRANSFUSE     Unit Number [id number]     Blood Component Type THAWED PLASMA     Unit division 00     Status of Unit ISSUED,FINAL     Transfusion Status  OK TO TRANSFUSE     Unit Number [id number]     Blood Component Type THAWED PLASMA     Unit division 00     Status of Unit ISSUED,FINAL     Transfusion Status OK TO TRANSFUSE     Unit Number [id number]     Blood Component Type THAWED  PLASMA     Unit division 00     Status of Unit ISSUED,FINAL     Transfusion Status OK TO TRANSFUSE     Unit Number [id number]     Blood Component Type THAWED PLASMA     Unit division 00     Status of Unit ISSUED,FINAL     Transfusion Status OK TO TRANSFUSE     Unit Number [id number]     Blood Component Type THAWED PLASMA     Unit division 00     Status of Unit ISSUED,FINAL     Transfusion Status OK TO TRANSFUSE     Unit Number [id number]     Blood Component Type THAWED PLASMA     Unit division 00     Status of Unit ISSUED,FINAL     Transfusion Status OK TO TRANSFUSE     Unit Number [id number]     Blood Component Type THAWED PLASMA     Unit division 00     Status of Unit ISSUED,FINAL     Transfusion Status OK TO TRANSFUSE     Unit Number [id number]     Blood Component Type THAWED PLASMA     Unit division 00     Status of Unit ISSUED,FINAL     Transfusion Status OK TO TRANSFUSE     Unit Number [id number]     Blood Component Type THAWED PLASMA     Unit division 00     Status of Unit ISSUED,FINAL     Transfusion Status OK TO TRANSFUSE     Unit Number [id number]     Blood Component Type THAWED PLASMA     Unit division 00     Status of Unit ISSUED,FINAL     Transfusion Status OK TO TRANSFUSE    PROTIME-INR     Status: Abnormal   Collection Time    06/08/12  9:00 AM      Result Value Range   Prothrombin Time 20.2 (*) 11.6 - 15.2 seconds   INR 1.79 (*) 0.00 - 1.49  APTT     Status: Abnormal   Collection Time    06/08/12  9:00 AM      Result Value Range   aPTT 50 (*) 24 - 37 seconds   Comment:            IF BASELINE aPTT IS ELEVATED,     SUGGEST PATIENT RISK ASSESSMENT     BE USED TO DETERMINE APPROPRIATE     ANTICOAGULANT THERAPY.  PREPARE RBC (CROSSMATCH)     Status: None   Collection Time    06/08/12 10:51 AM      Result Value Range   Order Confirmation ORDER PROCESSED BY BLOOD BANK    PREPARE FRESH FROZEN PLASMA     Status: None    Collection Time    06/08/12 10:58 AM      Result Value Range   Unit Number [id number]     Blood Component Type THAWED PLASMA     Unit division 00     Status of Unit ISSUED,FINAL     Transfusion Status OK TO TRANSFUSE  Unit Number [id number]     Blood Component Type THAWED PLASMA     Unit division 00     Status of Unit ISSUED,FINAL     Transfusion Status OK TO TRANSFUSE    GLUCOSE, CAPILLARY     Status: Abnormal   Collection Time    06/08/12 11:34 AM      Result Value Range   Glucose-Capillary 256 (*) 70 - 99 mg/dL  CBC WITH DIFFERENTIAL     Status: Abnormal   Collection Time    06/08/12 12:00 PM      Result Value Range   WBC 4.1  4.0 - 10.5 K/uL   RBC 2.50 (*) 4.22 - 5.81 MIL/uL   Hemoglobin 6.7 (*) 13.0 - 17.0 g/dL   Comment: CRITICAL VALUE NOTED.  VALUE IS CONSISTENT WITH PREVIOUSLY REPORTED AND CALLED VALUE.     POST TRANSFUSION SPECIMEN   HCT 19.7 (*) 39.0 - 52.0 %   MCV 78.8  78.0 - 100.0 fL   MCH 26.8  26.0 - 34.0 pg   MCHC 34.0  30.0 - 36.0 g/dL   RDW 11.9  14.7 - 82.9 %   Platelets 44 (*) 150 - 400 K/uL   Comment: CONSISTENT WITH PREVIOUS RESULT   Neutrophils Relative % 67  43 - 77 %   Lymphocytes Relative 29  12 - 46 %   Monocytes Relative 4  3 - 12 %   Eosinophils Relative 0  0 - 5 %   Basophils Relative 0  0 - 1 %   Band Neutrophils 0  0 - 10 %   Metamyelocytes Relative 0     Myelocytes 0     Promyelocytes Absolute 0     Blasts 0     nRBC 0  0 /100 WBC   Neutro Abs 2.7  1.7 - 7.7 K/uL   Lymphs Abs 1.2  0.7 - 4.0 K/uL   Monocytes Absolute 0.2  0.1 - 1.0 K/uL   Eosinophils Absolute 0.0  0.0 - 0.7 K/uL   Basophils Absolute 0.0  0.0 - 0.1 K/uL   WBC Morphology ATYPICAL LYMPHOCYTES    GLUCOSE, CAPILLARY     Status: Abnormal   Collection Time    06/08/12  2:11 PM      Result Value Range   Glucose-Capillary 243 (*) 70 - 99 mg/dL  GLUCOSE, CAPILLARY     Status: Abnormal   Collection Time    06/08/12  3:15 PM      Result Value Range    Glucose-Capillary 205 (*) 70 - 99 mg/dL  BASIC METABOLIC PANEL     Status: Abnormal   Collection Time    06/08/12  4:00 PM      Result Value Range   Sodium 143  135 - 145 mEq/L   Potassium 2.8 (*) 3.5 - 5.1 mEq/L   Chloride 97  96 - 112 mEq/L   CO2 34 (*) 19 - 32 mEq/L   Glucose, Bld 256 (*) 70 - 99 mg/dL   BUN 80 (*) 6 - 23 mg/dL   Creatinine, Ser 5.62 (*) 0.50 - 1.35 mg/dL   Calcium 9.0  8.4 - 13.0 mg/dL   GFR calc non Af Amer 16 (*) >90 mL/min   GFR calc Af Amer 18 (*) >90 mL/min   Comment:            The eGFR has been calculated     using the CKD EPI equation.     This calculation has  not been     validated in all clinical     situations.     eGFR's persistently     <90 mL/min signify     possible Chronic Kidney Disease.  GLUCOSE, CAPILLARY     Status: Abnormal   Collection Time    06/08/12  4:32 PM      Result Value Range   Glucose-Capillary 209 (*) 70 - 99 mg/dL  GLUCOSE, CAPILLARY     Status: Abnormal   Collection Time    06/08/12  6:56 PM      Result Value Range   Glucose-Capillary 241 (*) 70 - 99 mg/dL  GLUCOSE, CAPILLARY     Status: Abnormal   Collection Time    06/08/12  8:15 PM      Result Value Range   Glucose-Capillary 206 (*) 70 - 99 mg/dL  GLUCOSE, CAPILLARY     Status: Abnormal   Collection Time    06/08/12  8:59 PM      Result Value Range   Glucose-Capillary 188 (*) 70 - 99 mg/dL  GLUCOSE, CAPILLARY     Status: Abnormal   Collection Time    06/08/12 10:10 PM      Result Value Range   Glucose-Capillary 162 (*) 70 - 99 mg/dL  GLUCOSE, CAPILLARY     Status: Abnormal   Collection Time    06/08/12 10:57 PM      Result Value Range   Glucose-Capillary 152 (*) 70 - 99 mg/dL  GLUCOSE, CAPILLARY     Status: Abnormal   Collection Time    06/09/12 12:04 AM      Result Value Range   Glucose-Capillary 157 (*) 70 - 99 mg/dL  GLUCOSE, CAPILLARY     Status: Abnormal   Collection Time    06/09/12  1:10 AM      Result Value Range   Glucose-Capillary 154  (*) 70 - 99 mg/dL  GLUCOSE, CAPILLARY     Status: Abnormal   Collection Time    06/09/12  2:05 AM      Result Value Range   Glucose-Capillary 146 (*) 70 - 99 mg/dL  GLUCOSE, CAPILLARY     Status: Abnormal   Collection Time    06/09/12  3:05 AM      Result Value Range   Glucose-Capillary 154 (*) 70 - 99 mg/dL  GLUCOSE, CAPILLARY     Status: Abnormal   Collection Time    06/09/12  3:54 AM      Result Value Range   Glucose-Capillary 161 (*) 70 - 99 mg/dL   Comment 1 Notify RN    RENAL FUNCTION PANEL     Status: Abnormal   Collection Time    06/09/12  4:21 AM      Result Value Range   Sodium 143  135 - 145 mEq/L   Potassium 3.0 (*) 3.5 - 5.1 mEq/L   Chloride 99  96 - 112 mEq/L   CO2 36 (*) 19 - 32 mEq/L   Glucose, Bld 161 (*) 70 - 99 mg/dL   BUN 83 (*) 6 - 23 mg/dL   Creatinine, Ser 1.61 (*) 0.50 - 1.35 mg/dL   Calcium 8.6  8.4 - 09.6 mg/dL   Phosphorus 4.0  2.3 - 4.6 mg/dL   Albumin 2.5 (*) 3.5 - 5.2 g/dL   GFR calc non Af Amer 18 (*) >90 mL/min   GFR calc Af Amer 21 (*) >90 mL/min   Comment:  The eGFR has been calculated     using the CKD EPI equation.     This calculation has not been     validated in all clinical     situations.     eGFR's persistently     <90 mL/min signify     possible Chronic Kidney Disease.  CBC     Status: Abnormal   Collection Time    06/09/12  4:21 AM      Result Value Range   WBC 3.3 (*) 4.0 - 10.5 K/uL   RBC 2.56 (*) 4.22 - 5.81 MIL/uL   Hemoglobin 7.0 (*) 13.0 - 17.0 g/dL   HCT 45.4 (*) 09.8 - 11.9 %   MCV 80.5  78.0 - 100.0 fL   MCH 27.3  26.0 - 34.0 pg   MCHC 34.0  30.0 - 36.0 g/dL   RDW 14.7  82.9 - 56.2 %   Platelets 40 (*) 150 - 400 K/uL   Comment: CONSISTENT WITH PREVIOUS RESULT  MAGNESIUM     Status: None   Collection Time    06/09/12  4:21 AM      Result Value Range   Magnesium 2.1  1.5 - 2.5 mg/dL  LACTATE DEHYDROGENASE     Status: None   Collection Time    06/09/12  4:21 AM      Result Value Range   LDH  215  94 - 250 U/L  BLOOD GAS, ARTERIAL     Status: Abnormal   Collection Time    06/09/12  5:00 AM      Result Value Range   FIO2 0.40     Delivery systems VENTILATOR     VT 600     Rate 15     Peep/cpap 5.0     pH, Arterial 7.468 (*) 7.350 - 7.450   pCO2 arterial 49.8 (*) 35.0 - 45.0 mmHg   pO2, Arterial 121.0 (*) 80.0 - 100.0 mmHg   Bicarbonate 35.6 (*) 20.0 - 24.0 mEq/L   TCO2 37.1  0 - 100 mmol/L   Acid-Base Excess 11.2 (*) 0.0 - 2.0 mmol/L   O2 Saturation 98.7     Patient temperature 98.6     Collection site A-LINE     Drawn by [id number]     Sample type ARTERIAL DRAW    GLUCOSE, CAPILLARY     Status: Abnormal   Collection Time    06/09/12  5:08 AM      Result Value Range   Glucose-Capillary 152 (*) 70 - 99 mg/dL  GLUCOSE, CAPILLARY     Status: Abnormal   Collection Time    06/09/12  6:02 AM      Result Value Range   Glucose-Capillary 151 (*) 70 - 99 mg/dL  GLUCOSE, CAPILLARY     Status: Abnormal   Collection Time    06/09/12  7:06 AM      Result Value Range   Glucose-Capillary 121 (*) 70 - 99 mg/dL  PREPARE RBC (CROSSMATCH)     Status: None   Collection Time    06/09/12  9:09 AM      Result Value Range   Order Confirmation ORDER PROCESSED BY BLOOD BANK     Dg Chest Port 1 View  06/09/2012   *RADIOLOGY REPORT*  Clinical Data: Endotracheal tube positioning  PORTABLE CHEST - 1 VIEW  Comparison: Chest radiograph 06/08/2012  Findings: Endotracheal tube is 5.5 cm from carina.  NG tube extends to the stomach.  Left PICC line  is unchanged.  Stable enlarged heart silhouette.  There are low lung volumes and mild basilar atelectasis.  No pulmonary edema.  IMPRESSION: 1.  Support apparatus in good position. 2.  Cardiomegaly and low lung volumes.   Original Report Authenticated By: Genevive Bi, M.D.   Dg Chest Port 1 View  06/08/2012   *RADIOLOGY REPORT*  Clinical Data: Intubated  PORTABLE CHEST - 1 VIEW  Comparison:   the previous day's study  Findings: Endotracheal tube has  been repositioned, tip now approximately 7 cm above carina.  Nasogastric tube and left subclavian central line are stable in position.  Heart size upper limits normal.  Low lung volumes with some coarse airspace opacities in the left mid lung and both lung bases, slightly increased.  No definite effusion.  IMPRESSION:  1.  Repositioned endotracheal tube with slight increase in asymmetric infiltrates or atelectasis.   Original Report Authenticated By: D. Andria Rhein, MD     MEDICATIONS: reviewed.     PROBLEM LIST:  1. Afib; was on Pradaxa  2. Sepsis with blood culture x 2 Staph coag neg; and sputum with fungal species.  3. Acute renal failure  4. Fever  5. Acute anemia  6. Thrombocytopenia  7. Coagulopathy.     IMPRESSION:   - Renal function improved with hemodialysis x 1 on 06/07/12.   - His systemic bleeding has also improved with dialysis to remove Pradaxa. - It is not possible to diagnose or rule out TTP.  [id number] is still pending.  Given the fact that his anemia and thrombocytopenia have stabilized and no further bleeding, I would recommend treating this as presumed TTP.    RECOMMENDATIONS:   - Continue with empiric antibiotics.  - Continue with daily plasmapheresis until ADAMTS 13 returns later this week.   - Risk to watch out for transient worsening in anemia, hypocalcemia, rigor, transfusion-related lung injury.  - Continue to monitor CBC bid with transfusion for Hct <20; platelet <10 or active profuse bleeding.

## 2012-06-09 NOTE — Progress Notes (Signed)
[facility]    Date of Admission:  06/06/2012   Total days of antibiotics 3        Day 3 doxy        Day 3 vanco        Day 3 piptazo   ID: Brett [name] is a 67 y.o. male with sepsis with VDRF, encephalopathy, TTP and acute renal failure  Active Problems:   Altered mental status   Septic shock   ARF (acute renal failure)   Metabolic acidosis   Diabetes   Diarrhea   Acute respiratory failure    Subjective: Afebrile/hypothermic, still having daily plasmapheresis for TTP  Medications:  . antiseptic oral rinse  15 mL Mouth Rinse QID  . chlorhexidine  15 mL Mouth Rinse BID  . Chlorhexidine Gluconate Cloth  6 each Topical Q0600  . doxycycline (VIBRAMYCIN) IV  100 mg Intravenous Q12H  . feeding supplement  60 mL Per Tube TID  . hydrocortisone sodium succinate  50 mg Intravenous Q6H  . insulin aspart  1-3 Units Subcutaneous Q4H  . insulin aspart  6 Units Subcutaneous Q4H  . insulin glargine  35 Units Subcutaneous Daily  . mupirocin ointment  1 application Nasal BID  . norepinephrine (LEVOPHED) Adult infusion  2-50 mcg/min Intravenous Once  . pantoprazole (PROTONIX) IV  40 mg Intravenous Q24H  . piperacillin-tazobactam (ZOSYN)  IV  2.25 g Intravenous Q6H  . vancomycin  1,750 mg Intravenous Q48H    Objective: Vital signs in last 24 hours: Temp:  [92.3 F (33.5 C)-98.9 F (37.2 C)] 97.5 F (36.4 C) (06/02 0945) Pulse Rate:  [88-111] 111 (06/02 0945) Resp:  [8-26] 17 (06/02 0945) BP: (95-144)/(51-75) 112/69 mmHg (06/02 0945) SpO2:  [99 %-100 %] 100 % (06/02 0857) Arterial Line BP: (90-141)/(47-80) 139/71 mmHg (06/02 0945) FiO2 (%):  [40 %] 40 % (06/02 0857) Weight:  [303 lb 5.7 oz (137.6 kg)] 303 lb 5.7 oz (137.6 kg) (06/02 0500)  General appearance: awakens to voice, agitated on vent  Eyes: EOMI  Neck: L IJ, dressed with pressure dressing on.  Lungs: rhonchi bilaterally  Heart: regular rate and rhythm  Abdomen: abnormal findings:  distended, hypoactive bowel sounds and obese  Extremities: edema anasarca. scattered papules, scabbed, on extremities. scars from previous knee surgery.    Lab Results  Recent Labs  06/08/12 1200 06/08/12 1600 06/09/12 0421  WBC 4.1  --  3.3*  HGB 6.7*  --  7.0*  HCT 19.7*  --  20.6*  NA  --  143 143  K  --  2.8* 3.0*  CL  --  97 99  CO2  --  34* 36*  BUN  --  80* 83*  CREATININE  --  3.64* 3.25*   Liver Panel  Recent Labs  06/06/12 1319 06/06/12 2233 06/07/12 0430 06/09/12 0421  PROT 5.2* 5.6*  --   --   ALBUMIN 2.2* 2.4* 2.3* 2.5*  AST 142* 144*  --   --   ALT 71* 71*  --   --   ALKPHOS 38* 45  --   --   BILITOT 0.7 0.6  --   --     Microbiology: 6/1 blood cx x 1 NGTD 5/30 blood cx x 2 MRSE  Studies/Results: Dg Chest Port 1 View  06/09/2012   *RADIOLOGY REPORT*  Clinical Data: Endotracheal tube positioning  PORTABLE CHEST - 1 VIEW  Comparison: Chest radiograph 06/08/2012  Findings: Endotracheal tube is 5.5 cm  from carina.  NG tube extends to the stomach.  Left PICC line is unchanged.  Stable enlarged heart silhouette.  There are low lung volumes and mild basilar atelectasis.  No pulmonary edema.  IMPRESSION: 1.  Support apparatus in good position. 2.  Cardiomegaly and low lung volumes.   Original Report Authenticated By: Genevive Bi, M.D.   Dg Chest Port 1 View  06/08/2012   *RADIOLOGY REPORT*  Clinical Data: Intubated  PORTABLE CHEST - 1 VIEW  Comparison:   the previous day's study  Findings: Endotracheal tube has been repositioned, tip now approximately 7 cm above carina.  Nasogastric tube and left subclavian central line are stable in position.  Heart size upper limits normal.  Low lung volumes with some coarse airspace opacities in the left mid lung and both lung bases, slightly increased.  No definite effusion.  IMPRESSION:  1.  Repositioned endotracheal tube with slight increase in asymmetric infiltrates or atelectasis.   Original Report Authenticated By: D.  Andria Rhein, MD   Dg Chest Port 1 View  06/07/2012   *RADIOLOGY REPORT*  Clinical Data: Endotracheal tube placement.  PORTABLE CHEST - 1 VIEW  Comparison: 06/07/2012 at 5:40 hours  Findings: 1120 hours.  Intubation.  Endotracheal tube terminates 3.1 cm above the carina. Nasogastric extends beyond the  inferior aspect of the film.  Mildly degraded exam due to AP portable technique and patient body habitus.  Cardiomegaly accentuated by AP portable technique.  Mild right hemidiaphragm elevation. No pleural effusion or pneumothorax.  Low lung volumes with resultant pulmonary interstitial prominence. Difficult to exclude mild pulmonary venous congestion. No lobar consolidation.  IMPRESSION: Appropriate position of endotracheal tube.  Cardiomegaly and low lung volumes, without definite acute disease.   Original Report Authenticated By: Jeronimo Greaves, M.D.   Dg Abd Portable 1v  06/07/2012   *RADIOLOGY REPORT*  Clinical Data: Oral gastric tube placement  PORTABLE ABDOMEN - 1 VIEW  Comparison: Abdomen and pelvis CT, 06/06/2012  Findings: The orogastric tube extends below the diaphragm to curl in the proximal stomach.  There is a normal bowel gas pattern.  The included soft tissues are unremarkable.  Degenerative changes are noted along the spine.  IMPRESSION: Orogastric tube is well positioned in the proximal stomach.   Original Report Authenticated By: Amie Portland, M.D.     Assessment/Plan: -  continue with doxycycline , await RMSF serologies and Ehrlichia serologies  - for MRSE bacteremia = recommend to have TEE to evaluate for endocarditis, we will follow up on repeat blood cx from 6/1 but this is while on antibiotics. Continue with vancomycin. Will discontinue piptazo.  - mold isolated from sputum maybe a red herring.  Hold on starting antifungal at this point until further identification from lab.   Drue Second [facility] Cell: [phone] Pager:  [phone]  06/09/2012, 10:06 AM

## 2012-06-09 NOTE — Progress Notes (Signed)
[facility] ADULT ICU REPLACEMENT PROTOCOL FOR AM LAB REPLACEMENT ONLY  The patient does not apply for the [facility] Adult ICU Electrolyte Replacment Protocol based on the criteria listed below:   1. Is GFR >/= 40 ml/min? no  Patient's GFR today is 18 2. Is urine output >/= 0.5 ml/kg/hr for the last 6 hours? yes Patient's UOP is 0.8 ml/kg/hr 3. Is BUN < 60 mg/dL? no  Patient's BUN today is 83 4. Abnormal electrolyte(s):K+3.0 5. Ordered repletion with: none 6. If a panic level lab has been reported, has the CCM MD in charge been notified? yes.   Physician:  E Deterding  Cathlean Cower [facility] 06/09/2012 5:26 AM

## 2012-06-10 ENCOUNTER — Inpatient Hospital Stay (HOSPITAL_COMMUNITY): Payer: Medicare Other

## 2012-06-10 DIAGNOSIS — E872 Acidosis: Secondary | ICD-10-CM

## 2012-06-10 LAB — GLUCOSE, CAPILLARY
Glucose-Capillary: 182 mg/dL — ABNORMAL HIGH (ref 70–99)
Glucose-Capillary: 160 mg/dL — ABNORMAL HIGH (ref 70–99)
Glucose-Capillary: 128 mg/dL — ABNORMAL HIGH (ref 70–99)
Glucose-Capillary: 205 mg/dL — ABNORMAL HIGH (ref 70–99)

## 2012-06-10 LAB — POCT I-STAT, CHEM 8
BUN: 83 mg/dL — ABNORMAL HIGH (ref 6–23)
Chloride: 93 mEq/L — ABNORMAL LOW (ref 96–112)
Chloride: 98 mEq/L (ref 96–112)
HCT: 25 % — ABNORMAL LOW (ref 39.0–52.0)
HCT: 21 % — ABNORMAL LOW (ref 39.0–52.0)
HCT: 25 % — ABNORMAL LOW (ref 39.0–52.0)
Hemoglobin: 8.5 g/dL — ABNORMAL LOW (ref 13.0–17.0)
Hemoglobin: 7.1 g/dL — ABNORMAL LOW (ref 13.0–17.0)
Potassium: 3.4 mEq/L — ABNORMAL LOW (ref 3.5–5.1)
Potassium: 3.1 mEq/L — ABNORMAL LOW (ref 3.5–5.1)
Sodium: 137 mEq/L (ref 135–145)
Sodium: 147 mEq/L — ABNORMAL HIGH (ref 135–145)
TCO2: 28 mmol/L (ref 0–100)
TCO2: 38 mmol/L (ref 0–100)

## 2012-06-10 LAB — RENAL FUNCTION PANEL
Albumin: 2.9 g/dL — ABNORMAL LOW (ref 3.5–5.2)
CO2: 41 mEq/L (ref 19–32)
Chloride: 104 mEq/L (ref 96–112)
GFR calc Af Amer: 42 mL/min — ABNORMAL LOW (ref >90)
GFR calc non Af Amer: 36 mL/min — ABNORMAL LOW (ref >90)
Potassium: 3.3 mEq/L — ABNORMAL LOW (ref 3.5–5.1)
Sodium: 153 mEq/L — ABNORMAL HIGH (ref 135–145)

## 2012-06-10 LAB — BASIC METABOLIC PANEL
BUN: 75 mg/dL — ABNORMAL HIGH (ref 6–23)
CO2: 42 mEq/L (ref 19–32)
Calcium: 9.3 mg/dL (ref 8.4–10.5)
Chloride: 103 mEq/L (ref 96–112)
Creatinine, Ser: 2.2 mg/dL — ABNORMAL HIGH (ref 0.50–1.35)
Glucose, Bld: 172 mg/dL — ABNORMAL HIGH (ref 70–99)

## 2012-06-10 LAB — CBC
HCT: 26.3 % — ABNORMAL LOW (ref 39.0–52.0)
Hemoglobin: 8.7 g/dL — ABNORMAL LOW (ref 13.0–17.0)
MCV: 83.8 fL (ref 78.0–100.0)
RBC: 3.14 MIL/uL — ABNORMAL LOW (ref 4.22–5.81)
WBC: 4.9 10*3/uL (ref 4.0–10.5)

## 2012-06-10 LAB — CULTURE, BLOOD (ROUTINE X 2)

## 2012-06-10 LAB — TYPE AND SCREEN

## 2012-06-10 LAB — MAGNESIUM: Magnesium: 1.7 mg/dL (ref 1.5–2.5)

## 2012-06-10 LAB — APTT: aPTT: 39 seconds — ABNORMAL HIGH (ref 24–37)

## 2012-06-10 MED ORDER — METOPROLOL TARTRATE 50 MG PO TABS
50.0000 mg | ORAL_TABLET | Freq: Two times a day (BID) | ORAL | Status: DC
  Administered 2012-06-10 – 2012-06-11 (×3): 50 mg via ORAL
  Filled 2012-06-10 (×4): qty 1

## 2012-06-10 MED ORDER — ACETAMINOPHEN 325 MG PO TABS: 650.0000 mg | ORAL_TABLET | ORAL | Status: DC | PRN

## 2012-06-10 MED ORDER — FUROSEMIDE 10 MG/ML IJ SOLN: 40.0000 mg | Freq: Four times a day (QID) | INTRAMUSCULAR | Status: DC

## 2012-06-10 MED ORDER — MAGNESIUM SULFATE 40 MG/ML IJ SOLN
2.0000 g | Freq: Once | INTRAMUSCULAR | Status: AC
  Administered 2012-06-10: 2 g via INTRAVENOUS
  Filled 2012-06-10: qty 50

## 2012-06-10 MED ORDER — POTASSIUM CHLORIDE 20 MEQ/15ML (10%) PO LIQD
40.0000 meq | Freq: Two times a day (BID) | ORAL | Status: DC
  Administered 2012-06-10: 40 meq via ORAL
  Filled 2012-06-10 (×2): qty 30

## 2012-06-10 MED ORDER — POTASSIUM CHLORIDE 10 MEQ/50ML IV SOLN
10.0000 meq | INTRAVENOUS | Status: AC
  Administered 2012-06-10 (×6): 10 meq via INTRAVENOUS
  Filled 2012-06-10: qty 300

## 2012-06-10 MED ORDER — ACD FORMULA A 0.73-2.45-2.2 GM/100ML VI SOLN
500.0000 mL | Status: DC
  Administered 2012-06-10 – 2012-06-11 (×2): 500 mL via INTRAVENOUS
  Filled 2012-06-10: qty 500

## 2012-06-10 MED ORDER — FREE WATER
250.0000 mL | Status: DC
  Administered 2012-06-10 – 2012-06-11 (×7): 250 mL

## 2012-06-10 MED ORDER — HYDRALAZINE HCL 10 MG PO TABS
10.0000 mg | ORAL_TABLET | Freq: Four times a day (QID) | ORAL | Status: DC
  Administered 2012-06-10 – 2012-06-11 (×4): 10 mg via ORAL
  Filled 2012-06-10 (×7): qty 1

## 2012-06-10 MED ORDER — POTASSIUM CHLORIDE 20 MEQ/15ML (10%) PO LIQD
40.0000 meq | Freq: Two times a day (BID) | ORAL | Status: AC
  Administered 2012-06-10 – 2012-06-11 (×3): 40 meq via ORAL
  Filled 2012-06-10 (×3): qty 30

## 2012-06-10 MED ORDER — SODIUM CHLORIDE 0.9 % IV SOLN
4.0000 g | Freq: Once | INTRAVENOUS | Status: AC
  Administered 2012-06-10: 4 g via INTRAVENOUS
  Filled 2012-06-10 (×2): qty 40

## 2012-06-10 MED ORDER — ANTICOAGULANT SODIUM CITRATE 4% (200MG/5ML) IV SOLN
5.0000 mL | Freq: Once | Status: AC
  Administered 2012-06-10: 5 mL
  Filled 2012-06-10 (×2): qty 250

## 2012-06-10 MED ORDER — VANCOMYCIN HCL 10 G IV SOLR
1750.0000 mg | INTRAVENOUS | Status: DC
  Administered 2012-06-10 – 2012-06-11 (×2): 1750 mg via INTRAVENOUS
  Filled 2012-06-10 (×2): qty 1750

## 2012-06-10 MED ORDER — DIPHENHYDRAMINE HCL 25 MG PO CAPS: 25.0000 mg | ORAL_CAPSULE | Freq: Four times a day (QID) | ORAL | Status: DC | PRN

## 2012-06-10 MED ORDER — POTASSIUM CHLORIDE IN NACL 20-0.45 MEQ/L-% IV SOLN
INTRAVENOUS | Status: DC
  Administered 2012-06-10: 1000 mL via INTRAVENOUS
  Administered 2012-06-11: 08:00:00 via INTRAVENOUS
  Filled 2012-06-10 (×5): qty 1000

## 2012-06-10 MED ORDER — METHYLPREDNISOLONE SODIUM SUCC 125 MG IJ SOLR
80.0000 mg | Freq: Once | INTRAMUSCULAR | Status: AC
Start: 2012-06-10 — End: 2012-06-10
  Administered 2012-06-10: 80 mg via INTRAVENOUS
  Filled 2012-06-10: qty 1.28

## 2012-06-10 MED ORDER — CALCIUM GLUCONATE 10 % IV SOLN
2.0000 g | Freq: Once | INTRAVENOUS | Status: DC
  Filled 2012-06-10: qty 20

## 2012-06-10 MED ORDER — POTASSIUM CHLORIDE 10 MEQ/50ML IV SOLN
10.0000 meq | INTRAVENOUS | Status: AC
  Administered 2012-06-10 (×4): 10 meq via INTRAVENOUS
  Filled 2012-06-10: qty 50
  Filled 2012-06-10: qty 200

## 2012-06-10 MED ORDER — FREE WATER
250.0000 mL | Freq: Four times a day (QID) | Status: DC
Start: 2012-06-10 — End: 2012-06-10
  Administered 2012-06-10 (×2): 250 mL

## 2012-06-10 NOTE — Progress Notes (Signed)
ANTIBIOTIC CONSULT NOTE - FOLLOW UP  Pharmacy Consult for Vancomycin, Zosyn Indication: rule out sepsis  No Known Allergies  Patient Measurements: Height: 5\' 11"  (180.3 cm) Weight: 303 lb 12.7 oz (137.8 kg) IBW/kg (Calculated) : 75.3  Vital Signs: Temp: 99.1 F (37.3 C) (06/03 0630) BP: 163/80 mmHg (06/03 0802) Pulse Rate: 94 (06/03 0802) Intake/Output from previous day: 06/02 0701 - 06/03 0700 In: 2655.7 [I.V.:665.7; Blood:700; NG/GT:690; IV Piggyback:600] Out: 4700 [Urine:3900; Stool:800] Intake/Output from this shift:    Labs:  Recent Labs  06/08/12 1200  06/09/12 0421 06/09/12 2000 06/10/12 0330  WBC 4.1  --  3.3*  --  4.9  HGB 6.7*  --  7.0*  --  8.7*  PLT 44*  --  40*  --  63*  CREATININE  --   < > 3.25* 2.42* 2.20*  < > = values in this interval not displayed.       Assessment: 66 YOM continues on vancomycin for MRSE bacteremia and doxycycline for history of recent tick bite.  Awaiting TEE.  Patient's renal function continues to improve; UOP also improving.  Vanc 5/30 >> Zosyn 5/30 >> 6/2 Doxy 5/31 >>  5/30: Blood - CoNS (2 of 2, sensitive to Clinda, LVQ, oxacillin, tet, Vanc) 5/30: Urine - negative 5/30: Sputum - few fungus (no tx yet, await further identification) 5/30: MRSA PCR - positive   Goal of Therapy:  Vancomycin trough level 15-20 mcg/ml   Plan: - Change Vanc to 1750mg  IV Q24H - Continue doxycycline 100mg  IV Q12H - Monitor renal fxn, micro data, clinical course, vanc trough at Css - f/u Mag supplementation, TEE, de-escalate abx as able     Adelaida Reindel D. Laney Potash, PharmD, BCPS Pager:  [phone] 06/10/2012, 8:35 AM

## 2012-06-10 NOTE — Progress Notes (Signed)
Called Elink to inform MD that the critical potassium of 2.8 that was reported earlier has not been addressed. MD sts that she is not covering this patients potassium until morning labs are received due to patient receiving dialysis. Will continue to monitor patient.

## 2012-06-10 NOTE — Progress Notes (Signed)
CRITICAL VALUE ALERT  Critical value received:  CO2 42  Date of notification:  06/10/2012  Time of notification:  0450  Critical value read back:yes  Nurse who received alert:  Loma Sender RN   MD notified (1st page):  Deterding  Time of first page:  [phone]  MD notified (2nd page):  Time of second page:  Responding MD:  Deterding   Time MD responded:  [phone]

## 2012-06-10 NOTE — Consult Note (Signed)
[facility] INPATIENT PROGRESS NOTE  Name: Brett [name]      MRN: [id number]    Location: 2112/2112-01  Date: 06/10/2012 Time:8:47 AM   Subjective: Interval History:Arthur L Tegeler again remained intubated.  He still has diffuse pruritus without visible rash.  Nursing staff reported no visible bleeding.    Objective: Vital signs in last 24 hours: Temp:  [95.6 F (35.3 C)-99.5 F (37.5 C)] 99.1 F (37.3 C) (06/03 0630) Pulse Rate:  [92-121] 94 (06/03 0802) Resp:  [14-31] 14 (06/03 0802) BP: (112-168)/(55-96) 163/80 mmHg (06/03 0802) SpO2:  [98 %-100 %] 99 % (06/03 0802) Arterial Line BP: (137-176)/(70-79) 163/75 mmHg (06/03 0630) FiO2 (%):  [40 %] 40 % (06/03 0802) Weight:  [303 lb 12.7 oz (137.8 kg)] 303 lb 12.7 oz (137.8 kg) (06/03 0100)     PHYSICAL EXAM:  General: Obese man, no longer in rigour.  Eyes: no scleral icterus. ENT: clear oropharynx. There was no blood in his nose. Respiratory: lungs showed bibasilar crackles.  Cardiovascular: Regular rate and rhythm, S1/S2, without murmur, rub or gallop. There was no pedal edema. GI: abdomen was soft, flat, nontender, nondistended, without organomegaly.   Studies/Results: Results for orders placed during the hospital encounter of 06/06/12 (from the past 48 hour(s))  PROTIME-INR     Status: Abnormal   Collection Time    06/08/12  9:00 AM      Result Value Range   Prothrombin Time 20.2 (*) 11.6 - 15.2 seconds   INR 1.79 (*) 0.00 - 1.49  APTT     Status: Abnormal   Collection Time    06/08/12  9:00 AM      Result Value Range   aPTT 50 (*) 24 - 37 seconds   Comment:            IF BASELINE aPTT IS ELEVATED,     SUGGEST PATIENT RISK ASSESSMENT     BE USED TO DETERMINE APPROPRIATE     ANTICOAGULANT THERAPY.  PREPARE RBC (CROSSMATCH)     Status: None   Collection Time    06/08/12 10:51 AM      Result Value Range   Order Confirmation ORDER PROCESSED BY BLOOD BANK    PREPARE FRESH FROZEN PLASMA     Status:  None   Collection Time    06/08/12 10:58 AM      Result Value Range   Unit Number [id number]     Blood Component Type THAWED PLASMA     Unit division 00     Status of Unit ISSUED,FINAL     Transfusion Status OK TO TRANSFUSE     Unit Number [id number]     Blood Component Type THAWED PLASMA     Unit division 00     Status of Unit ISSUED,FINAL     Transfusion Status OK TO TRANSFUSE    GLUCOSE, CAPILLARY     Status: Abnormal   Collection Time    06/08/12 11:34 AM      Result Value Range   Glucose-Capillary 256 (*) 70 - 99 mg/dL  CBC WITH DIFFERENTIAL     Status: Abnormal   Collection Time    06/08/12 12:00 PM      Result Value Range   WBC 4.1  4.0 - 10.5 K/uL   RBC 2.50 (*) 4.22 - 5.81 MIL/uL   Hemoglobin 6.7 (*) 13.0 - 17.0 g/dL   Comment: CRITICAL VALUE NOTED.  VALUE IS CONSISTENT WITH PREVIOUSLY REPORTED AND CALLED VALUE.  POST TRANSFUSION SPECIMEN   HCT 19.7 (*) 39.0 - 52.0 %   MCV 78.8  78.0 - 100.0 fL   MCH 26.8  26.0 - 34.0 pg   MCHC 34.0  30.0 - 36.0 g/dL   RDW 16.1  09.6 - 04.5 %   Platelets 44 (*) 150 - 400 K/uL   Comment: CONSISTENT WITH PREVIOUS RESULT   Neutrophils Relative % 67  43 - 77 %   Lymphocytes Relative 29  12 - 46 %   Monocytes Relative 4  3 - 12 %   Eosinophils Relative 0  0 - 5 %   Basophils Relative 0  0 - 1 %   Band Neutrophils 0  0 - 10 %   Metamyelocytes Relative 0     Myelocytes 0     Promyelocytes Absolute 0     Blasts 0     nRBC 0  0 /100 WBC   Neutro Abs 2.7  1.7 - 7.7 K/uL   Lymphs Abs 1.2  0.7 - 4.0 K/uL   Monocytes Absolute 0.2  0.1 - 1.0 K/uL   Eosinophils Absolute 0.0  0.0 - 0.7 K/uL   Basophils Absolute 0.0  0.0 - 0.1 K/uL   WBC Morphology ATYPICAL LYMPHOCYTES    EHRLICHIA ANTIBODY PANEL     Status: None   Collection Time    06/08/12 12:00 PM      Result Value Range   E chaffeensis Ab, IgG Negative  Negative   E chaffeensis Ab, IgM Negative  Negative   Comment: (NOTE)     Ehrlichia chaffeensis has been identified  as the causative agent of     Human Monocytic Ehrlichiosis (HME).  Infected individuals produce     specific antibodies to E. chaffeensis that can be detected by an     immunofluorescent antibody (IFA) test.  Single IgG IFA titers of 1:64     or greater indicate exposure to E. chaffeensis.  A fourfold rise in     IgG titers between acute and convalescent samples and/or the presence     of IgM antibody against E. chaffeensis suggest recent or current     infection.  ROCKY MTN SPOTTED FVR AB, IGM-BLOOD     Status: None   Collection Time    06/08/12 12:00 PM      Result Value Range   RMSF IgM 0.15  0.00 - 0.89 IV   Comment: (NOTE)     IV = Index Value     REFERENCE INTERVAL     [facility] Spotted Fever Antibody, IgM     IgM (IV)   Result                Interpretation:     ---------  ---------              ---------------     <0.90      Negative     No significant level of Rickettsia rickettsii                            IgM antibody detected. If clinically                            indicated, repeat sample within 7-14 days.     0.90-1.10  Equivocal    Questionable presence of Rickettsia rickettsii  IgM antibody detected.  Recommend recollecting                            and retesting, if clinically indicated.     >1.10      Positive     Presence of IgM antibody to Rickettsia                            rickettsii detected.  IgM suggestive of                            current or recent infection.     The RMSF IgM test was validated and its performance characteristics     determined by [employer].  It has not been cleared     or approved by the U.[place]. Food and Drug Administration.  The FDA has     determined that such clearance or approval is not necessary.  This     test is used for clinical purposes.  It should not be regarded as     investigational or for research.  GLUCOSE, CAPILLARY     Status: Abnormal   Collection Time    06/08/12  2:11 PM       Result Value Range   Glucose-Capillary 243 (*) 70 - 99 mg/dL  GLUCOSE, CAPILLARY     Status: Abnormal   Collection Time    06/08/12  3:15 PM      Result Value Range   Glucose-Capillary 205 (*) 70 - 99 mg/dL  BASIC METABOLIC PANEL     Status: Abnormal   Collection Time    06/08/12  4:00 PM      Result Value Range   Sodium 143  135 - 145 mEq/L   Potassium 2.8 (*) 3.5 - 5.1 mEq/L   Chloride 97  96 - 112 mEq/L   CO2 34 (*) 19 - 32 mEq/L   Glucose, Bld 256 (*) 70 - 99 mg/dL   BUN 80 (*) 6 - 23 mg/dL   Creatinine, Ser 1.61 (*) 0.50 - 1.35 mg/dL   Calcium 9.0  8.4 - 09.6 mg/dL   GFR calc non Af Amer 16 (*) >90 mL/min   GFR calc Af Amer 18 (*) >90 mL/min   Comment:            The eGFR has been calculated     using the CKD EPI equation.     This calculation has not been     validated in all clinical     situations.     eGFR's persistently     <90 mL/min signify     possible Chronic Kidney Disease.  GLUCOSE, CAPILLARY     Status: Abnormal   Collection Time    06/08/12  4:32 PM      Result Value Range   Glucose-Capillary 209 (*) 70 - 99 mg/dL  GLUCOSE, CAPILLARY     Status: Abnormal   Collection Time    06/08/12  6:56 PM      Result Value Range   Glucose-Capillary 241 (*) 70 - 99 mg/dL  GLUCOSE, CAPILLARY     Status: Abnormal   Collection Time    06/08/12  8:15 PM      Result Value Range   Glucose-Capillary 206 (*) 70 - 99 mg/dL  GLUCOSE, CAPILLARY     Status: Abnormal  Collection Time    06/08/12  8:59 PM      Result Value Range   Glucose-Capillary 188 (*) 70 - 99 mg/dL  GLUCOSE, CAPILLARY     Status: Abnormal   Collection Time    06/08/12 10:10 PM      Result Value Range   Glucose-Capillary 162 (*) 70 - 99 mg/dL  GLUCOSE, CAPILLARY     Status: Abnormal   Collection Time    06/08/12 10:57 PM      Result Value Range   Glucose-Capillary 152 (*) 70 - 99 mg/dL  GLUCOSE, CAPILLARY     Status: Abnormal   Collection Time    06/09/12 12:04 AM      Result Value  Range   Glucose-Capillary 157 (*) 70 - 99 mg/dL  GLUCOSE, CAPILLARY     Status: Abnormal   Collection Time    06/09/12  1:10 AM      Result Value Range   Glucose-Capillary 154 (*) 70 - 99 mg/dL  GLUCOSE, CAPILLARY     Status: Abnormal   Collection Time    06/09/12  2:05 AM      Result Value Range   Glucose-Capillary 146 (*) 70 - 99 mg/dL  GLUCOSE, CAPILLARY     Status: Abnormal   Collection Time    06/09/12  3:05 AM      Result Value Range   Glucose-Capillary 154 (*) 70 - 99 mg/dL  GLUCOSE, CAPILLARY     Status: Abnormal   Collection Time    06/09/12  3:54 AM      Result Value Range   Glucose-Capillary 161 (*) 70 - 99 mg/dL   Comment 1 Notify RN    RENAL FUNCTION PANEL     Status: Abnormal   Collection Time    06/09/12  4:21 AM      Result Value Range   Sodium 143  135 - 145 mEq/L   Potassium 3.0 (*) 3.5 - 5.1 mEq/L   Chloride 99  96 - 112 mEq/L   CO2 36 (*) 19 - 32 mEq/L   Glucose, Bld 161 (*) 70 - 99 mg/dL   BUN 83 (*) 6 - 23 mg/dL   Creatinine, Ser 1.61 (*) 0.50 - 1.35 mg/dL   Calcium 8.6  8.4 - 09.6 mg/dL   Phosphorus 4.0  2.3 - 4.6 mg/dL   Albumin 2.5 (*) 3.5 - 5.2 g/dL   GFR calc non Af Amer 18 (*) >90 mL/min   GFR calc Af Amer 21 (*) >90 mL/min   Comment:            The eGFR has been calculated     using the CKD EPI equation.     This calculation has not been     validated in all clinical     situations.     eGFR's persistently     <90 mL/min signify     possible Chronic Kidney Disease.  CBC     Status: Abnormal   Collection Time    06/09/12  4:21 AM      Result Value Range   WBC 3.3 (*) 4.0 - 10.5 K/uL   RBC 2.56 (*) 4.22 - 5.81 MIL/uL   Hemoglobin 7.0 (*) 13.0 - 17.0 g/dL   HCT 04.5 (*) 40.9 - 81.1 %   MCV 80.5  78.0 - 100.0 fL   MCH 27.3  26.0 - 34.0 pg   MCHC 34.0  30.0 - 36.0 g/dL   RDW 91.4  78.2 -  15.5 %   Platelets 40 (*) 150 - 400 K/uL   Comment: CONSISTENT WITH PREVIOUS RESULT  MAGNESIUM     Status: None   Collection Time    06/09/12   4:21 AM      Result Value Range   Magnesium 2.1  1.5 - 2.5 mg/dL  LACTATE DEHYDROGENASE     Status: None   Collection Time    06/09/12  4:21 AM      Result Value Range   LDH 215  94 - 250 U/L  BLOOD GAS, ARTERIAL     Status: Abnormal   Collection Time    06/09/12  5:00 AM      Result Value Range   FIO2 0.40     Delivery systems VENTILATOR     VT 600     Rate 15     Peep/cpap 5.0     pH, Arterial 7.468 (*) 7.350 - 7.450   pCO2 arterial 49.8 (*) 35.0 - 45.0 mmHg   pO2, Arterial 121.0 (*) 80.0 - 100.0 mmHg   Bicarbonate 35.6 (*) 20.0 - 24.0 mEq/L   TCO2 37.1  0 - 100 mmol/L   Acid-Base Excess 11.2 (*) 0.0 - 2.0 mmol/L   O2 Saturation 98.7     Patient temperature 98.6     Collection site A-LINE     Drawn by [id number]     Sample type ARTERIAL DRAW    GLUCOSE, CAPILLARY     Status: Abnormal   Collection Time    06/09/12  5:08 AM      Result Value Range   Glucose-Capillary 152 (*) 70 - 99 mg/dL  GLUCOSE, CAPILLARY     Status: Abnormal   Collection Time    06/09/12  6:02 AM      Result Value Range   Glucose-Capillary 151 (*) 70 - 99 mg/dL  GLUCOSE, CAPILLARY     Status: Abnormal   Collection Time    06/09/12  7:06 AM      Result Value Range   Glucose-Capillary 121 (*) 70 - 99 mg/dL  PREPARE RBC (CROSSMATCH)     Status: None   Collection Time    06/09/12  9:09 AM      Result Value Range   Order Confirmation ORDER PROCESSED BY BLOOD BANK    GLUCOSE, CAPILLARY     Status: Abnormal   Collection Time    06/09/12 12:23 PM      Result Value Range   Glucose-Capillary 191 (*) 70 - 99 mg/dL  THERAPEUTIC PLASMA EXCHANGE     Status: None   Collection Time    06/09/12  1:47 PM      Result Value Range   Plasma Exchange 4250 ML THAWED     Plasma volume needed 4250     Unit Number [id number]     Blood Component Type THAWED PLASMA     Unit division 00     Status of Unit ISSUED     Transfusion Status OK TO TRANSFUSE     Unit Number [id number]     Blood Component Type THAWED  PLASMA     Unit division 00     Status of Unit ISSUED     Transfusion Status OK TO TRANSFUSE     Unit Number [id number]     Blood Component Type THAWED PLASMA     Unit division 00     Status of Unit ISSUED     Transfusion Status OK TO TRANSFUSE  Unit Number [id number]     Blood Component Type THAWED PLASMA     Unit division 00     Status of Unit ISSUED     Transfusion Status OK TO TRANSFUSE     Unit Number [id number]     Blood Component Type THAWED PLASMA     Unit division 00     Status of Unit ISSUED     Transfusion Status OK TO TRANSFUSE     Unit Number [id number]     Blood Component Type THAWED PLASMA     Unit division 00     Status of Unit ISSUED     Transfusion Status OK TO TRANSFUSE     Unit Number [id number]     Blood Component Type THAWED PLASMA     Unit division 00     Status of Unit ISSUED     Transfusion Status OK TO TRANSFUSE     Unit Number [id number]     Blood Component Type THAWED PLASMA     Unit division 00     Status of Unit ISSUED     Transfusion Status OK TO TRANSFUSE     Unit Number [id number]     Blood Component Type THAWED PLASMA     Unit division 00     Status of Unit ISSUED     Transfusion Status OK TO TRANSFUSE     Unit Number [id number]     Blood Component Type THAWED PLASMA     Unit division 00     Status of Unit ISSUED     Transfusion Status OK TO TRANSFUSE     Unit Number [id number]     Blood Component Type THAWED PLASMA     Unit division 00     Status of Unit ISSUED     Transfusion Status OK TO TRANSFUSE     Unit Number [id number]     Blood Component Type THAWED PLASMA     Unit division 00     Status of Unit ISSUED     Transfusion Status OK TO TRANSFUSE     Unit Number [id number]     Blood Component Type THAWED PLASMA     Unit division 00     Status of Unit ISSUED     Transfusion Status OK TO TRANSFUSE     Unit Number [id number]     Blood Component Type THAWED PLASMA     Unit division 00      Status of Unit ISSUED     Transfusion Status OK TO TRANSFUSE    GLUCOSE, CAPILLARY     Status: Abnormal   Collection Time    06/09/12  4:14 PM      Result Value Range   Glucose-Capillary 180 (*) 70 - 99 mg/dL  BASIC METABOLIC PANEL     Status: Abnormal   Collection Time    06/09/12  8:00 PM      Result Value Range   Sodium 150 (*) 135 - 145 mEq/L   Potassium 2.8 (*) 3.5 - 5.1 mEq/L   Chloride 100  96 - 112 mEq/L   CO2 40 (*) 19 - 32 mEq/L   Comment: CRITICAL RESULT CALLED TO, READ BACK BY AND VERIFIED WITH:     D HOLVONDER,RN 2146 06/09/12 WBOND   Glucose, Bld 181 (*) 70 - 99 mg/dL   BUN 77 (*) 6 - 23 mg/dL   Creatinine, Ser 1.02 (*) 0.50 - 1.35 mg/dL   Calcium 9.3  8.4 - 58.5  mg/dL   GFR calc non Af Amer 26 (*) >90 mL/min   GFR calc Af Amer 30 (*) >90 mL/min   Comment:            The eGFR has been calculated     using the CKD EPI equation.     This calculation has not been     validated in all clinical     situations.     eGFR's persistently     <90 mL/min signify     possible Chronic Kidney Disease.  GLUCOSE, CAPILLARY     Status: Abnormal   Collection Time    06/09/12  8:43 PM      Result Value Range   Glucose-Capillary 182 (*) 70 - 99 mg/dL  GLUCOSE, CAPILLARY     Status: Abnormal   Collection Time    06/09/12 11:03 PM      Result Value Range   Glucose-Capillary 186 (*) 70 - 99 mg/dL  GLUCOSE, CAPILLARY     Status: Abnormal   Collection Time    06/10/12 12:20 AM      Result Value Range   Glucose-Capillary 170 (*) 70 - 99 mg/dL   Comment 1 Documented in Chart     Comment 2 Notify RN    BASIC METABOLIC PANEL     Status: Abnormal   Collection Time    06/10/12  3:30 AM      Result Value Range   Sodium 153 (*) 135 - 145 mEq/L   Potassium 2.9 (*) 3.5 - 5.1 mEq/L   Chloride 103  96 - 112 mEq/L   CO2 42 (*) 19 - 32 mEq/L   Comment: CRITICAL RESULT CALLED TO, READ BACK BY AND VERIFIED WITH:     GARNER L,RN 06/10/12 0449 WAYK   Glucose, Bld 172 (*) 70 - 99  mg/dL   BUN 75 (*) 6 - 23 mg/dL   Creatinine, Ser 7.82 (*) 0.50 - 1.35 mg/dL   Calcium 9.3  8.4 - 95.6 mg/dL   GFR calc non Af Amer 29 (*) >90 mL/min   GFR calc Af Amer 34 (*) >90 mL/min   Comment:            The eGFR has been calculated     using the CKD EPI equation.     This calculation has not been     validated in all clinical     situations.     eGFR's persistently     <90 mL/min signify     possible Chronic Kidney Disease.  MAGNESIUM     Status: None   Collection Time    06/10/12  3:30 AM      Result Value Range   Magnesium 1.7  1.5 - 2.5 mg/dL  PROTIME-INR     Status: Abnormal   Collection Time    06/10/12  3:30 AM      Result Value Range   Prothrombin Time 15.8 (*) 11.6 - 15.2 seconds   INR 1.29  0.00 - 1.49  APTT     Status: Abnormal   Collection Time    06/10/12  3:30 AM      Result Value Range   aPTT 39 (*) 24 - 37 seconds   Comment:            IF BASELINE aPTT IS ELEVATED,     SUGGEST PATIENT RISK ASSESSMENT     BE USED TO DETERMINE APPROPRIATE     ANTICOAGULANT THERAPY.  CBC  Status: Abnormal   Collection Time    06/10/12  3:30 AM      Result Value Range   WBC 4.9  4.0 - 10.5 K/uL   RBC 3.14 (*) 4.22 - 5.81 MIL/uL   Hemoglobin 8.7 (*) 13.0 - 17.0 g/dL   Comment: POST TRANSFUSION SPECIMEN   HCT 26.3 (*) 39.0 - 52.0 %   MCV 83.8  78.0 - 100.0 fL   MCH 27.7  26.0 - 34.0 pg   MCHC 33.1  30.0 - 36.0 g/dL   RDW 16.1  09.6 - 04.5 %   Platelets 63 (*) 150 - 400 K/uL   Comment: CONSISTENT WITH PREVIOUS RESULT  PHOSPHORUS     Status: None   Collection Time    06/10/12  3:30 AM      Result Value Range   Phosphorus 3.0  2.3 - 4.6 mg/dL  GLUCOSE, CAPILLARY     Status: Abnormal   Collection Time    06/10/12  3:50 AM      Result Value Range   Glucose-Capillary 164 (*) 70 - 99 mg/dL   Comment 1 Documented in Chart     Comment 2 Notify RN    GLUCOSE, CAPILLARY     Status: Abnormal   Collection Time    06/10/12  7:23 AM      Result Value Range    Glucose-Capillary 160 (*) 70 - 99 mg/dL   Dg Chest Port 1 View  06/10/2012   *RADIOLOGY REPORT*  Clinical Data: Endotracheal tube placement.  PORTABLE CHEST - 1 VIEW  Comparison: 1 day prior  Findings: Endotracheal tube is unchanged in position.  3.9 cm above carina.  Nasogastric extends beyond the  inferior aspect of the film.  Left subclavian line unchanged.  Cardiomegaly accentuated by AP portable technique.  Cannot exclude small left pleural effusion. No pneumothorax.  Low lung volumes with resultant pulmonary interstitial prominence.  No congestive failure.  Probable left base atelectasis.  IMPRESSION: No significant change since one day prior.  Low lung volumes with probable left base atelectasis.  Cannot exclude small left pleural effusion.   Original Report Authenticated By: Jeronimo Greaves, M.D.   Dg Chest Port 1 View  06/09/2012   *RADIOLOGY REPORT*  Clinical Data: Endotracheal tube positioning  PORTABLE CHEST - 1 VIEW  Comparison: Chest radiograph 06/08/2012  Findings: Endotracheal tube is 5.5 cm from carina.  NG tube extends to the stomach.  Left PICC line is unchanged.  Stable enlarged heart silhouette.  There are low lung volumes and mild basilar atelectasis.  No pulmonary edema.  IMPRESSION: 1.  Support apparatus in good position. 2.  Cardiomegaly and low lung volumes.   Original Report Authenticated By: Genevive Bi, M.D.     MEDICATIONS: reviewed.     PROBLEM LIST:  1. Afib; was on Pradaxa  2. Sepsis with blood culture x 2 Staph coag neg; and sputum with fungal species.  3. Acute renal failure  4. Fever  5. Acute anemia  6. Thrombocytopenia  7. Coagulopathy.     IMPRESSION:   - Renal function improved with hemodialysis x 1 on 06/07/12.   - His systemic bleeding has resolved with dialysis to remove Pradaxa. - His presentation is consistent with sepsis and Pradaxa toxicity.  However, It is not possible to diagnose or rule out TTP.  [id number] is due to be out on Thursday 06/12/12.   Given the fact that his anemia and thrombocytopenia have slightly improved, I would recommend treating this as presumed  TTP.    RECOMMENDATIONS:   - Continue with empiric antibiotics.  - Continue with daily plasmapheresis until ADAMTS 13 returns later this week.   - Risk to watch out for transient worsening in anemia, hypocalcemia, rigor, transfusion-related lung injury.  - Continue to monitor CBC bid with transfusion for Hct <20; platelet <10 or active profuse bleeding.

## 2012-06-10 NOTE — Progress Notes (Signed)
CRITICAL VALUE ALERT  Critical value received: CO2 41  Date of notification:  06/10/2012   Time of notification:  3:14 PM   Critical value read back:yes  Nurse who received alert:  Little Ishikawa RN  MD notified (1st page):  Anders Simmonds NP  Time of first page:  06/10/2012   MD notified (2nd page):  Time of second page:  Responding MD:  Anders Simmonds NP  Time MD responded:  3:14 PM

## 2012-06-10 NOTE — Progress Notes (Signed)
[facility]    Date of Admission:  06/06/2012   Total days of antibiotics 4        Day 4 doxy        Day 5 vanco        (Day 3 piptazo)   ID: Brett [name] is a 67 y.o. male with sepsis with VDRF, encephalopathy, TTP and acute renal failure  Active Problems:   Altered mental status   Septic shock   ARF (acute renal failure)   Metabolic acidosis   Diabetes   Diarrhea   Acute respiratory failure    Subjective: Afebrile; aki improving as well as marginal improvement with plts  Medications:  . anticoagulant sodium citrate  5 mL Intracatheter Once  . antiseptic oral rinse  15 mL Mouth Rinse QID  . calcium gluconate IVPB  4 g Intravenous Once  . calcium gluconate  2 g Intravenous Once  . chlorhexidine  15 mL Mouth Rinse BID  . Chlorhexidine Gluconate Cloth  6 each Topical Q0600  . doxycycline (VIBRAMYCIN) IV  100 mg Intravenous Q12H  . feeding supplement  60 mL Per Tube TID  . free water  250 mL Per Tube QID  . hydrocortisone sodium succinate  50 mg Intravenous Q6H  . insulin aspart  1-3 Units Subcutaneous Q4H  . insulin aspart  6 Units Subcutaneous Q4H  . insulin glargine  35 Units Subcutaneous Daily  . metoprolol tartrate  50 mg Oral BID  . mupirocin ointment  1 application Nasal BID  . norepinephrine (LEVOPHED) Adult infusion  2-50 mcg/min Intravenous Once  . pantoprazole sodium  40 mg Per Tube Q1200  . potassium chloride  40 mEq Oral BID  . vancomycin  1,750 mg Intravenous Q24H    Objective: Vital signs in last 24 hours: Temp:  [98.3 F (36.8 C)-99.5 F (37.5 C)] 98.4 F (36.9 C) (06/03 1305) Pulse Rate:  [86-121] 86 (06/03 1305) Resp:  [10-32] 28 (06/03 1305) BP: (117-178)/(55-96) 167/83 mmHg (06/03 1305) SpO2:  [98 %-100 %] 100 % (06/03 1305) Arterial Line BP: (139-176)/(70-82) 172/81 mmHg (06/03 1100) FiO2 (%):  [40 %] 40 % (06/03 1305) Weight:  [303 lb 12.7 oz (137.8 kg)] 303 lb 12.7 oz (137.8 kg) (06/03 0100)  General  appearance: awakens to voice, agitated on vent  Eyes: EOMI  Neck: L IJ, dressed with pressure dressing on.  Lungs: rhonchi bilaterally  Heart: regular rate and rhythm  Abdomen: abnormal findings: distended, hypoactive bowel sounds and obese  Extremities: edema anasarca. scattered papules, scabbed, on extremities. scars from previous knee surgery.    Lab Results  Recent Labs  06/09/12 0421 06/09/12 1545 06/09/12 2000 06/10/12 0330  WBC 3.3*  --   --  4.9  HGB 7.0* 8.5*  --  8.7*  HCT 20.6* 25.0*  --  26.3*  NA 143 147* 150* 153*  K 3.0* 3.0* 2.8* 2.9*  CL 99 98 100 103  CO2 36*  --  40* 42*  BUN 83* 83* 77* 75*  CREATININE 3.25* 2.40* 2.42* 2.20*   Liver Panel  Recent Labs  06/09/12 0421  ALBUMIN 2.5*    Microbiology: 6/1 blood cx x 1 NGTD 5/30 blood cx x 2 MRSE  Studies/Results: Dg Chest Port 1 View  06/10/2012   *RADIOLOGY REPORT*  Clinical Data: Endotracheal tube placement.  PORTABLE CHEST - 1 VIEW  Comparison: 1 day prior  Findings: Endotracheal tube is unchanged in position.  3.9 cm above carina.  Nasogastric extends beyond the  inferior aspect of the film.  Left subclavian line unchanged.  Cardiomegaly accentuated by AP portable technique.  Cannot exclude small left pleural effusion. No pneumothorax.  Low lung volumes with resultant pulmonary interstitial prominence.  No congestive failure.  Probable left base atelectasis.  IMPRESSION: No significant change since one day prior.  Low lung volumes with probable left base atelectasis.  Cannot exclude small left pleural effusion.   Original Report Authenticated By: Jeronimo Greaves, M.D.   Dg Chest Port 1 View  06/09/2012   *RADIOLOGY REPORT*  Clinical Data: Endotracheal tube positioning  PORTABLE CHEST - 1 VIEW  Comparison: Chest radiograph 06/08/2012  Findings: Endotracheal tube is 5.5 cm from carina.  NG tube extends to the stomach.  Left PICC line is unchanged.  Stable enlarged heart silhouette.  There are low lung volumes  and mild basilar atelectasis.  No pulmonary edema.  IMPRESSION: 1.  Support apparatus in good position. 2.  Cardiomegaly and low lung volumes.   Original Report Authenticated By: Genevive Bi, M.D.     Assessment/Plan: -   RMSF serologies and Ehrlichia serologies are negative, we have other explanation for low platelets as possibly related to pradaxa toxicity. We will discontinue doxy. - for MRSE bacteremia = recommend to have TEE to evaluate for endocarditis, we will follow up on repeat blood cx from 6/1 but this is while on antibiotics. Continue with vancomycin treat for a minimum of 14 days.   - mold isolated from sputum maybe a red herring.  Hold on starting antifungal at this point until further identification from lab.   Spent 30 min with patient with greater than 50% with coordination of care with providers/lab  Drue Second [facility] Cell: [phone] Pager: [phone]  06/10/2012, 1:15 PM

## 2012-06-10 NOTE — Progress Notes (Signed)
PULMONARY  / CRITICAL CARE MEDICINE  Name: Brett [name] MRN: [id number] DOB: 06/06/1945    ADMISSION DATE:  06/06/2012 CONSULTATION DATE:  06/06/2012  REFERRING MD :  EDP PRIMARY SERVICE: PCCM  CHIEF COMPLAINT:  Tired and SOB  BRIEF PATIENT DESCRIPTION: 67 year old male admitted on 5/30 w/ 4 d h/o non-bloody diarrhea. Patient continued to take his ACE and pradaxa.  Presents to the ED with BP in the 60's after 3 liters of fluid.  PCCM called to admit as a code sepsis.  SIGNIFICANT EVENTS / STUDIES:  5/30 Septic shock, admit to ICU.  LINES / TUBES: L [place] TLC 5/30>>> R a-line 5/30>>> ET tube 5/31>>> L femoral trialysis catheter 5/30>>>  CULTURES: Blood 5/30>>>Coag neg staph Urine 5/30>>>neg Sputum 5/30>>>Mold  ANTIBIOTICS: Vanc 5/30>>> Zosyn 5/30>>> 6/2 Doxycycline 5/31>>>  SUBJECTIVE: Tired, diarrhea.  VITAL SIGNS: Temp:  [95.6 F (35.3 C)-99.5 F (37.5 C)] 99.1 F (37.3 C) (06/03 0630) Pulse Rate:  [92-121] 94 (06/03 0802) Resp:  [14-31] 14 (06/03 0802) BP: (112-168)/(55-96) 163/80 mmHg (06/03 0802) SpO2:  [98 %-100 %] 99 % (06/03 0802) Arterial Line BP: (137-176)/(70-79) 163/75 mmHg (06/03 0630) FiO2 (%):  [40 %] 40 % (06/03 0802) Weight:  [137.8 kg (303 lb 12.7 oz)] 137.8 kg (303 lb 12.7 oz) (06/03 0100) HEMODYNAMICS:   VENTILATOR SETTINGS: Vent Mode:  [-] CPAP;PSV FiO2 (%):  [40 %] 40 % Set Rate:  [15 bmp] 15 bmp Vt Set:  [600 mL] 600 mL PEEP:  [5 cmH20] 5 cmH20 Pressure Support:  [10 cmH20] 10 cmH20 Plateau Pressure:  [15 cmH20] 15 cmH20 INTAKE / OUTPUT: Intake/Output     06/02 0701 - 06/03 0700 06/03 0701 - 06/04 0700   I.V. (mL/kg) 665.7 (4.8) 20 (0.1)   Blood 700    NG/GT 690    IV Piggyback 600    Total Intake(mL/kg) 2655.7 (19.3) 20 (0.1)   Urine (mL/kg/hr) 3900 (1.2)    Stool 800 (0.2)    Total Output 4700     Net -2044.4 +20         PHYSICAL EXAMINATION: General: Chronically ill appearing obese male,  intubated. Neuro: Arousable  and follows commands HEENT:  [place]/AT, PERRL, EOM-I and DMM. Cardiovascular:  IRIR, Nl S1/S2, -M/R/G. Lungs:  CTA bilaterally. Abdomen:  Soft, NT, ND and +BS. Musculoskeletal:  -edema and -tenderness. Skin:  Both lower ext with multiple lesions.  LABS:  Recent Labs Lab 06/09/12 0421 06/09/12 2000 06/10/12 0330  NA 143 150* 153*  K 3.0* 2.8* 2.9*  CL 99 100 103  CO2 36* 40* 42*  BUN 83* 77* 75*  CREATININE 3.25* 2.42* 2.20*  GLUCOSE 161* 181* 172*    Recent Labs Lab 06/08/12 1200 06/09/12 0421 06/10/12 0330  HGB 6.7* 7.0* 8.7*  HCT 19.7* 20.6* 26.3*  WBC 4.1 3.3* 4.9  PLT 44* 40* 63*    Recent Labs Lab 06/06/12 1122 06/06/12 1319 06/06/12 2233  ALT 93* 71* 71*  AST 178* 142* 144*  ALKPHOS 51 38* 45  BILITOT 0.9 0.7 0.6   CXR: Low lung volumes   ASSESSMENT / PLAN:  PULMONARY A:   Respiratory failure due to poor mental status mental status has improved following plasmapheresis  OSA by history. P:   - Continue PS - F/U CXR and ABG.  CARDIOVASCULAR A:  Shock likely due to hypovolemia-->now resolved no indication of infection at this time other than possibly the legs with cellulitis.   A-fib with RVR and HTN.-->now ST P:  -  See ID section. - CVPs as ordered. - KVO IVF. -Will restart home metoprolol for HR and BP control  RENAL A:  ARF, no history of renal disease.  Likely a combination of dehydration and ACE use. Did get 1 round of HD, now Creatinine improving and UOP acceptable.  Fluid and electrolyte imbalance/ hypokalemia  P:   - KVO IVF. - Monitor lytes closely. Replace as indicated - Diureses as BP permits. - Will increase free water flush for hypernatremia  GASTROINTESTINAL A:  Diarrhea. P:   - Stool culture pending. - Continue TF.  HEMATOLOGIC A:   Thrombocytopenia of unknown cause.  ?TTP, ?HSP platelet count is improving with plasmapheresis  Hemorrhagic anemia: suspect multifactorial hemolysis: TTP and possibly plasmapheresis.  P:   - Peripheral smear per H/O. - Stool culture pending. - Plasmapheresis per H/O.  INFECTIOUS A:  Likely bowel or cellulitis. P:   - Cultures as above. - Abx as above.  ENDOCRINE A:  ?DM, none by history.   P:   - ISS.  NEUROLOGIC A: acute encephalopathy.  Non-focal.  ?uremia. P:   - Minimize sedative agents for potential extubation  TODAY'S SUMMARY: Brett [name] has improved mental status on PS ventilation, could consider extubation today. His platelet count is improving with plasmapheresis. Will continue to diurese with lasix as grossly volume overloaded.   06/10/2012, 9:15 AM  CC time 35 min.  Alyson Reedy, M.D. [name] Pulmonary/Critical Care Medicine. Pager: [phone]. After hours pager: [phone].

## 2012-06-10 NOTE — Progress Notes (Signed)
Patient ID: Brett [name], male   DOB: 06-02-1945, 67 y.o.   MRN: [id number] S:awake and alert, remains intubated O:BP 163/80  Pulse 94  Temp(Src) 99.1 F (37.3 C) (Core (Comment))  Resp 14  Ht 5\' 11"  (1.803 m)  Wt 137.8 kg (303 lb 12.7 oz)  BMI 42.39 kg/m2  SpO2 99%  Intake/Output Summary (Last 24 hours) at 06/10/12 0932 Last data filed at 06/10/12 0900  Gross per 24 hour  Intake 2763.25 ml  Output   4950 ml  Net -2186.75 ml   Intake/Output: I/O last 3 completed shifts: In: 4579.9 [I.V.:1132.4; Blood:1062.5; NG/GT:1285; IV Piggyback:1100] Out: 6140 [Urine:5140; Stool:1000]  Intake/Output this shift:  Total I/O In: 210 [I.V.:110; IV Piggyback:100] Out: 400 [Urine:400] Weight change: 0.2 kg (7.1 oz) Gen:WD obese WM, intubated but responsive CVS:no rub Resp:occ rhonchi WUJ:WJXBJ Ext:tr edema   Recent Labs Lab 06/06/12 1122 06/06/12 1319 06/06/12 2233 06/07/12 0354 06/07/12 0430 06/08/12 0353 06/08/12 1600 06/09/12 0421 06/09/12 2000 06/10/12 0330  NA 125* 128* 134* 134* 135 138 143 143 150* 153*  K 3.9 3.4* 3.6 3.1* 3.2* 3.0* 2.8* 3.0* 2.8* 2.9*  CL 88* 98 96 95* 97 94* 97 99 100 103  CO2 13* 10* 16* 20 20 30  34* 36* 40* 42*  GLUCOSE 153* 132* 192* 172* 161* 219* 256* 161* 181* 172*  BUN 124* 117* 124* 130* 129* 85* 80* 83* 77* 75*  CREATININE 11.11* 10.03* 9.60* 9.46* 9.46* 4.83* 3.64* 3.25* 2.42* 2.20*  ALBUMIN 2.9* 2.2* 2.4*  --  2.3*  --   --  2.5*  --   --   CALCIUM 8.1* 6.6* 7.2* 7.1* 7.1* 7.5* 9.0 8.6 9.3 9.3  PHOS  --   --   --  5.5* 5.5* 4.5  --  4.0  --  3.0  AST 178* 142* 144*  --   --   --   --   --   --   --   ALT 93* 71* 71*  --   --   --   --   --   --   --    Liver Function Tests:  Recent Labs Lab 06/06/12 1122 06/06/12 1319 06/06/12 2233 06/07/12 0430 06/09/12 0421  AST 178* 142* 144*  --   --   ALT 93* 71* 71*  --   --   ALKPHOS 51 38* 45  --   --   BILITOT 0.9 0.7 0.6  --   --   PROT 7.1 5.2* 5.6*  --   --   ALBUMIN 2.9*  2.2* 2.4* 2.3* 2.5*   No results found for this basename: LIPASE, AMYLASE,  in the last 168 hours No results found for this basename: AMMONIA,  in the last 168 hours CBC:  Recent Labs Lab 06/07/12 1525 06/08/12 0353 06/08/12 1200 06/09/12 0421 06/10/12 0330  WBC 2.2* 2.3* 4.1 3.3* 4.9  NEUTROABS  --   --  2.7  --   --   HGB 7.3* 5.6* 6.7* 7.0* 8.7*  HCT 20.7* 16.3* 19.7* 20.6* 26.3*  MCV 75.8* 77.6* 78.8 80.5 83.8  PLT 35* 32* 44* 40* 63*   Cardiac Enzymes:  Recent Labs Lab 06/06/12 1132 06/06/12 1319 06/06/12 2241 06/07/12 0354 06/07/12 1041  TROPONINI <0.30 <0.30 <0.30 <0.30 <0.30   CBG:  Recent Labs Lab 06/09/12 2043 06/09/12 2303 06/10/12 0020 06/10/12 0350 06/10/12 0723  GLUCAP 182* 186* 170* 164* 160*    Iron Studies: No results found for this basename: IRON, TIBC,  TRANSFERRIN, FERRITIN,  in the last 72 hours Studies/Results: Dg Chest Port 1 View  06/10/2012   *RADIOLOGY REPORT*  Clinical Data: Endotracheal tube placement.  PORTABLE CHEST - 1 VIEW  Comparison: 1 day prior  Findings: Endotracheal tube is unchanged in position.  3.9 cm above carina.  Nasogastric extends beyond the  inferior aspect of the film.  Left subclavian line unchanged.  Cardiomegaly accentuated by AP portable technique.  Cannot exclude small left pleural effusion. No pneumothorax.  Low lung volumes with resultant pulmonary interstitial prominence.  No congestive failure.  Probable left base atelectasis.  IMPRESSION: No significant change since one day prior.  Low lung volumes with probable left base atelectasis.  Cannot exclude small left pleural effusion.   Original Report Authenticated By: Jeronimo Greaves, M.D.   Dg Chest Port 1 View  06/09/2012   *RADIOLOGY REPORT*  Clinical Data: Endotracheal tube positioning  PORTABLE CHEST - 1 VIEW  Comparison: Chest radiograph 06/08/2012  Findings: Endotracheal tube is 5.5 cm from carina.  NG tube extends to the stomach.  Left PICC line is unchanged.   Stable enlarged heart silhouette.  There are low lung volumes and mild basilar atelectasis.  No pulmonary edema.  IMPRESSION: 1.  Support apparatus in good position. 2.  Cardiomegaly and low lung volumes.   Original Report Authenticated By: Genevive Bi, M.D.   . antiseptic oral rinse  15 mL Mouth Rinse QID  . chlorhexidine  15 mL Mouth Rinse BID  . Chlorhexidine Gluconate Cloth  6 each Topical Q0600  . doxycycline (VIBRAMYCIN) IV  100 mg Intravenous Q12H  . feeding supplement  60 mL Per Tube TID  . furosemide  40 mg Intravenous Q6H  . hydrocortisone sodium succinate  50 mg Intravenous Q6H  . insulin aspart  1-3 Units Subcutaneous Q4H  . insulin aspart  6 Units Subcutaneous Q4H  . insulin glargine  35 Units Subcutaneous Daily  . magnesium sulfate 1 - 4 g bolus IVPB  2 g Intravenous Once  . metoprolol tartrate  50 mg Oral BID  . mupirocin ointment  1 application Nasal BID  . norepinephrine (LEVOPHED) Adult infusion  2-50 mcg/min Intravenous Once  . pantoprazole sodium  40 mg Per Tube Q1200  . potassium chloride  10 mEq Intravenous Q1 Hr x 6  . potassium chloride  40 mEq Oral BID  . vancomycin  1,750 mg Intravenous Q24H    BMET    Component Value Date/Time   NA 153* 06/10/2012 0330   K 2.9* 06/10/2012 0330   CL 103 06/10/2012 0330   CO2 42* 06/10/2012 0330   GLUCOSE 172* 06/10/2012 0330   BUN 75* 06/10/2012 0330   CREATININE 2.20* 06/10/2012 0330   CALCIUM 9.3 06/10/2012 0330   GFRNONAA 29* 06/10/2012 0330   GFRAA 34* 06/10/2012 0330   CBC    Component Value Date/Time   WBC 4.9 06/10/2012 0330   RBC 3.14* 06/10/2012 0330   HGB 8.7* 06/10/2012 0330   HCT 26.3* 06/10/2012 0330   PLT 63* 06/10/2012 0330   MCV 83.8 06/10/2012 0330   MCH 27.7 06/10/2012 0330   MCHC 33.1 06/10/2012 0330   RDW 14.7 06/10/2012 0330   LYMPHSABS 1.2 06/08/2012 1200   MONOABS 0.2 06/08/2012 1200   EOSABS 0.0 06/08/2012 1200   BASOSABS 0.0 06/08/2012 1200     Assessment/Plan:  1. AKI- non-oliguric. S/p one HD session on 06/07/12  mainly for pradaxa drug clearance. Scr has continued to improve since admission and pt has been undergoing  daily plasmapheresis for presumed TTP. Cont with current care as he appears to be responding. No indication for HD. 2. Hypernatremia- abrupt increase. Will start free water and hypotonic IVF's and stop IV furosemide.  Cont to follow Sna levels. 3. Hypokalemia- replete 4. HUS/TTP- Hematology following and coordinating plasmapheresis. No sig change in platelets, however does not appear to be bleeding this am. 5. ABLA- as above 6. SIRS- MRSE+blood cultures. On vanco. ID following. On vanco/zosyn. Source of infection unclear ?osteo 7. Diarrhea- C diff negative 8. VDRF- per PCCM 9. AMS- improving 10. A fib- off of pradaxa 11. Coagulopathy- as above 12. Metabolic alkalosis- stop lasix and hydrate with hypotonic saline. 13.  Ezell Melikian A

## 2012-06-11 ENCOUNTER — Inpatient Hospital Stay (HOSPITAL_COMMUNITY): Payer: Medicare Other

## 2012-06-11 DIAGNOSIS — M311 Thrombotic microangiopathy: Secondary | ICD-10-CM | POA: Diagnosis present

## 2012-06-11 DIAGNOSIS — M3119 Other thrombotic microangiopathy: Secondary | ICD-10-CM

## 2012-06-11 DIAGNOSIS — A4901 Methicillin susceptible Staphylococcus aureus infection, unspecified site: Secondary | ICD-10-CM

## 2012-06-11 HISTORY — DX: Other thrombotic microangiopathy: M31.19

## 2012-06-11 LAB — THERAPEUTIC PLASMA EXCHANGE (BLOOD BANK)
Plasma Exchange: 3956
Plasma volume needed: 3950
Unit division: 0
Unit division: 0
Unit division: 0
Unit division: 0
Unit division: 0
Unit division: 0
Unit division: 0
Unit division: 0
Unit division: 0
Unit division: 0
Unit division: 0
Unit division: 0
Unit division: 0
Unit division: 0

## 2012-06-11 LAB — STOOL CULTURE

## 2012-06-11 LAB — PHOSPHORUS: Phosphorus: 2.6 mg/dL (ref 2.3–4.6)

## 2012-06-11 LAB — CBC
Hemoglobin: 8.5 g/dL — ABNORMAL LOW (ref 13.0–17.0)
MCH: 27.3 pg (ref 26.0–34.0)
MCHC: 31.4 g/dL (ref 30.0–36.0)
Platelets: 99 10*3/uL — ABNORMAL LOW (ref 150–400)

## 2012-06-11 LAB — BASIC METABOLIC PANEL
BUN: 58 mg/dL — ABNORMAL HIGH (ref 6–23)
CO2: 40 mEq/L (ref 19–32)
Calcium: 9.3 mg/dL (ref 8.4–10.5)
Chloride: 107 mEq/L (ref 96–112)
Chloride: 107 mEq/L (ref 96–112)
Creatinine, Ser: 1.54 mg/dL — ABNORMAL HIGH (ref 0.50–1.35)
Creatinine, Ser: 1.44 mg/dL — ABNORMAL HIGH (ref 0.50–1.35)
GFR calc Af Amer: 52 mL/min — ABNORMAL LOW (ref >90)
GFR calc Af Amer: 57 mL/min — ABNORMAL LOW (ref >90)
Glucose, Bld: 197 mg/dL — ABNORMAL HIGH (ref 70–99)
Sodium: 156 mEq/L — ABNORMAL HIGH (ref 135–145)

## 2012-06-11 LAB — TYPE AND SCREEN
Unit division: 0
Unit division: 0
Unit division: 0
Unit division: 0

## 2012-06-11 LAB — GLUCOSE, CAPILLARY
Glucose-Capillary: 160 mg/dL — ABNORMAL HIGH (ref 70–99)
Glucose-Capillary: 192 mg/dL — ABNORMAL HIGH (ref 70–99)
Glucose-Capillary: 251 mg/dL — ABNORMAL HIGH (ref 70–99)

## 2012-06-11 LAB — POCT I-STAT, CHEM 8
BUN: 72 mg/dL — ABNORMAL HIGH (ref 6–23)
Calcium, Ion: 1.16 mmol/L (ref 1.13–1.30)
Glucose, Bld: 140 mg/dL — ABNORMAL HIGH (ref 70–99)
TCO2: 44 mmol/L (ref 0–100)

## 2012-06-11 LAB — MAGNESIUM: Magnesium: 1.8 mg/dL (ref 1.5–2.5)

## 2012-06-11 MED ORDER — SODIUM CHLORIDE 0.9 % IV SOLN
4.0000 g | Freq: Once | INTRAVENOUS | Status: AC
  Administered 2012-06-11: 4 g via INTRAVENOUS
  Filled 2012-06-11: qty 40

## 2012-06-11 MED ORDER — CEFAZOLIN SODIUM-DEXTROSE 2-3 GM-% IV SOLR
2.0000 g | Freq: Three times a day (TID) | INTRAVENOUS | Status: DC
  Administered 2012-06-11 – 2012-06-19 (×25): 2 g via INTRAVENOUS
  Filled 2012-06-11 (×30): qty 50

## 2012-06-11 MED ORDER — HYDRALAZINE HCL 25 MG PO TABS
25.0000 mg | ORAL_TABLET | Freq: Four times a day (QID) | ORAL | Status: DC
  Administered 2012-06-11 – 2012-06-12 (×3): 25 mg via ORAL
  Filled 2012-06-11 (×7): qty 1

## 2012-06-11 MED ORDER — ANTICOAGULANT SODIUM CITRATE 4% (200MG/5ML) IV SOLN
5.0000 mL | Freq: Once | Status: AC
  Administered 2012-06-11: 5 mL
  Filled 2012-06-11: qty 250

## 2012-06-11 MED ORDER — HYDROCORTISONE SOD SUCCINATE 100 MG IJ SOLR
50.0000 mg | Freq: Two times a day (BID) | INTRAMUSCULAR | Status: DC
  Administered 2012-06-11 – 2012-06-12 (×3): 50 mg via INTRAVENOUS
  Filled 2012-06-11 (×5): qty 1

## 2012-06-11 MED ORDER — METOPROLOL TARTRATE 100 MG PO TABS
100.0000 mg | ORAL_TABLET | Freq: Two times a day (BID) | ORAL | Status: DC
  Administered 2012-06-11 – 2012-06-19 (×16): 100 mg via ORAL
  Filled 2012-06-11 (×18): qty 1

## 2012-06-11 MED ORDER — DEXTROSE 5 % IV SOLN
INTRAVENOUS | Status: DC
  Administered 2012-06-11 – 2012-06-12 (×2): via INTRAVENOUS

## 2012-06-11 NOTE — Progress Notes (Signed)
Patient ID: AODHAN SCHEIDT, male   DOB: 08-03-1945, 67 y.o.   MRN: [id number] S:intubated but awake/alert O:BP 178/83  Pulse 86  Temp(Src) 98.4 F (36.9 C) (Core (Comment))  Resp 13  Ht 5\' 11"  (1.803 m)  Wt 136.7 kg (301 lb 5.9 oz)  BMI 42.05 kg/m2  SpO2 98%  Intake/Output Summary (Last 24 hours) at 06/11/12 0923 Last data filed at 06/11/12 0800  Gross per 24 hour  Intake   6085 ml  Output   3875 ml  Net   2210 ml   Intake/Output: I/O last 3 completed shifts: In: 7090.8 [I.V.:2895.8; NG/GT:1895; IV Piggyback:2300] Out: 6525 [Urine:5525; Stool:1000]  Intake/Output this shift:  Total I/O In: 410 [I.V.:115; NG/GT:45; IV Piggyback:250] Out: 625 [Urine:375; Stool:250] Weight change: -1.1 kg (-2 lb 6.8 oz) Gen:WD obese WM intubated but awake/alert CVS:no rub Resp:cta WUJ:WJXBJY Ext:+edema of upper extremeties   Recent Labs Lab 06/06/12 1122 06/06/12 1319 06/06/12 2233 06/07/12 0354 06/07/12 0430  06/08/12 0353 06/08/12 1600 06/08/12 1717 06/09/12 0421 06/09/12 1545 06/09/12 2000 06/10/12 0330 06/10/12 1400 06/11/12 0448  NA 125* 128* 134* 134* 135  < > 138 143 142 143 147* 150* 153* 153* 156*  K 3.9 3.4* 3.6 3.1* 3.2*  < > 3.0* 2.8* 3.1* 3.0* 3.0* 2.8* 2.9* 3.3* 3.6  CL 88* 98 96 95* 97  < > 94* 97 93* 99 98 100 103 104 107  CO2 13* 10* 16* 20 20  --  30 34*  --  36*  --  40* 42* 41* 40*  GLUCOSE 153* 132* 192* 172* 161*  < > 219* 256* 197* 161* 184* 181* 172* 226* 210*  BUN 124* 117* 124* 130* 129*  < > 85* 80* 95* 83* 83* 77* 75* 66* 63*  CREATININE 11.11* 10.03* 9.60* 9.46* 9.46*  < > 4.83* 3.64* 3.70* 3.25* 2.40* 2.42* 2.20* 1.84* 1.54*  ALBUMIN 2.9* 2.2* 2.4*  --  2.3*  --   --   --   --  2.5*  --   --   --  2.9*  --   CALCIUM 8.1* 6.6* 7.2* 7.1* 7.1*  --  7.5* 9.0  --  8.6  --  9.3 9.3 9.5 9.3  PHOS  --   --   --  5.5* 5.5*  --  4.5  --   --  4.0  --   --  3.0 3.0 2.6  AST 178* 142* 144*  --   --   --   --   --   --   --   --   --   --   --   --   ALT  93* 71* 71*  --   --   --   --   --   --   --   --   --   --   --   --   < > = values in this interval not displayed. Liver Function Tests:  Recent Labs Lab 06/06/12 1122 06/06/12 1319 06/06/12 2233 06/07/12 0430 06/09/12 0421 06/10/12 1400  AST 178* 142* 144*  --   --   --   ALT 93* 71* 71*  --   --   --   ALKPHOS 51 38* 45  --   --   --   BILITOT 0.9 0.7 0.6  --   --   --   PROT 7.1 5.2* 5.6*  --   --   --   ALBUMIN 2.9* 2.2* 2.4*  2.3* 2.5* 2.9*   No results found for this basename: LIPASE, AMYLASE,  in the last 168 hours No results found for this basename: AMMONIA,  in the last 168 hours CBC:  Recent Labs Lab 06/08/12 0353 06/08/12 1200  06/09/12 0421 06/09/12 1545 06/10/12 0330 06/11/12 0448  WBC 2.3* 4.1  --  3.3*  --  4.9 4.1  NEUTROABS  --  2.7  --   --   --   --   --   HGB 5.6* 6.7*  < > 7.0* 8.5* 8.7* 8.5*  HCT 16.3* 19.7*  < > 20.6* 25.0* 26.3* 27.1*  MCV 77.6* 78.8  --  80.5  --  83.8 87.1  PLT 32* 44*  --  40*  --  63* 99*  < > = values in this interval not displayed. Cardiac Enzymes:  Recent Labs Lab 06/06/12 1132 06/06/12 1319 06/06/12 2241 06/07/12 0354 06/07/12 1041  TROPONINI <0.30 <0.30 <0.30 <0.30 <0.30   CBG:  Recent Labs Lab 06/10/12 1527 06/10/12 2009 06/11/12 0021 06/11/12 0349 06/11/12 0821  GLUCAP 205* 192* 251* 205* 189*    Iron Studies: No results found for this basename: IRON, TIBC, TRANSFERRIN, FERRITIN,  in the last 72 hours Studies/Results: Dg Chest Port 1 View  06/11/2012   *RADIOLOGY REPORT*  Clinical Data: Evaluate lung fields, short of breath  PORTABLE CHEST - 1 VIEW  Comparison: Prior chest x-ray 06/10/2012  Findings: Stable position of endotracheal tube 3 cm above the carina.  Left subclavian approach central venous catheter with the tip in the mid superior vena cava.  The tip of the nasogastric tube lies below the diaphragm, likely within the stomach.  Slightly decreased interstitial edema.  Persistent pulmonary  vascular congestion and cardiomegaly.  Low inspiratory volumes with left greater than right basilar opacities remain unchanged.  IMPRESSION:  1.  Slightly improved pulmonary vascular congestion 2.  Stable cardiomegaly and left greater than right basilar opacities likely reflecting atelectasis. 3.  Stable and satisfactory support apparatus   Original Report Authenticated By: Malachy Moan, M.D.   Dg Chest Port 1 View  06/10/2012   *RADIOLOGY REPORT*  Clinical Data: Endotracheal tube placement.  PORTABLE CHEST - 1 VIEW  Comparison: 1 day prior  Findings: Endotracheal tube is unchanged in position.  3.9 cm above carina.  Nasogastric extends beyond the  inferior aspect of the film.  Left subclavian line unchanged.  Cardiomegaly accentuated by AP portable technique.  Cannot exclude small left pleural effusion. No pneumothorax.  Low lung volumes with resultant pulmonary interstitial prominence.  No congestive failure.  Probable left base atelectasis.  IMPRESSION: No significant change since one day prior.  Low lung volumes with probable left base atelectasis.  Cannot exclude small left pleural effusion.   Original Report Authenticated By: Jeronimo Greaves, M.D.   . antiseptic oral rinse  15 mL Mouth Rinse QID  . calcium gluconate  2 g Intravenous Once  . chlorhexidine  15 mL Mouth Rinse BID  . doxycycline (VIBRAMYCIN) IV  100 mg Intravenous Q12H  . feeding supplement  60 mL Per Tube TID  . free water  250 mL Per Tube Q4H  . hydrALAZINE  10 mg Oral Q6H  . hydrocortisone sodium succinate  50 mg Intravenous Q6H  . insulin aspart  1-3 Units Subcutaneous Q4H  . insulin aspart  6 Units Subcutaneous Q4H  . insulin glargine  35 Units Subcutaneous Daily  . metoprolol tartrate  50 mg Oral BID  . mupirocin ointment  1 application  Nasal BID  . norepinephrine (LEVOPHED) Adult infusion  2-50 mcg/min Intravenous Once  . pantoprazole sodium  40 mg Per Tube Q1200  . potassium chloride  40 mEq Oral BID  . potassium  chloride  40 mEq Oral BID  . vancomycin  1,750 mg Intravenous Q24H    BMET    Component Value Date/Time   NA 156* 06/11/2012 0448   K 3.6 06/11/2012 0448   CL 107 06/11/2012 0448   CO2 40* 06/11/2012 0448   GLUCOSE 210* 06/11/2012 0448   BUN 63* 06/11/2012 0448   CREATININE 1.54* 06/11/2012 0448   CALCIUM 9.3 06/11/2012 0448   GFRNONAA 45* 06/11/2012 0448   GFRAA 52* 06/11/2012 0448   CBC    Component Value Date/Time   WBC 4.1 06/11/2012 0448   RBC 3.11* 06/11/2012 0448   HGB 8.5* 06/11/2012 0448   HCT 27.1* 06/11/2012 0448   PLT 99* 06/11/2012 0448   MCV 87.1 06/11/2012 0448   MCH 27.3 06/11/2012 0448   MCHC 31.4 06/11/2012 0448   RDW 14.9 06/11/2012 0448   LYMPHSABS 1.2 06/08/2012 1200   MONOABS 0.2 06/08/2012 1200   EOSABS 0.0 06/08/2012 1200   BASOSABS 0.0 06/08/2012 1200     Assessment/Plan:  1. AKI- non-oliguric. S/p one HD session on 06/07/12 mainly for pradaxa drug clearance. Scr has continued to improve since admission and pt has been undergoing daily plasmapheresis for presumed TTP. Cont with current care as he appears to be responding. No indication for HD. 2. Hypernatremia- abrupt increase. Free water deficit is ~9L.  Started on free water and hypotonic IVF's yesterday.  Increased appropriately by PCCM.  Still climbing.  Will start D5W to help improve Serum Na if ok with PCCM. IV furosemide stopped yesterday. Cont to follow Sna levels. 3. DM- will likely need more insulin given need for D5W 4. Hypokalemia- replete 5. HUS/TTP- Hematology following and coordinating plasmapheresis. No sig change in platelets, however does not appear to be bleeding this am. 6. ABLA- as above 7. SIRS- MRSE+blood cultures. On vanco. ID following. On vanco/zosyn. Source of infection unclear ?osteo 8. Diarrhea- C diff negative 9. VDRF- per PCCM 10. AMS- improving 11. A fib- off of pradaxa 12. Coagulopathy- as above 13. Metabolic alkalosis- stop lasix and hydrate with hypotonic saline. 14.   Laray Rivkin A

## 2012-06-11 NOTE — Progress Notes (Signed)
06/11/2012- Resp Care note- 1415- Pt suctioned and extubated to 4lpm cannula.  Hr97, sats 98% on 4lpm cannula.  Pt vocalizing post extubation.  Will cont to monitor progress and wean as tolerated.

## 2012-06-11 NOTE — Progress Notes (Signed)
Art line pressures correlating with manual pressure (154/82), MD informed and told me to discontinue art line. Spoke with Dr. Molli Knock, MD.   Delynn Flavin, RN, BSN

## 2012-06-11 NOTE — Progress Notes (Signed)
Pt on wean screen past two hours, doing well. Assessed by MD, order to extubate placed. Pt extubated by RT, placed on 4L [place]. Pt is alert and oriented, follows commands, and has a strong voice. Ice chips and sips of water given with no s/s of resp distress, coughing, or desaturations. Pt educated on slowly swallowing and risk for aspiration r/t his intubation for 5 days. Verbalized understanding.   Delynn Flavin, RN, BSN

## 2012-06-11 NOTE — Progress Notes (Signed)
[facility]    Date of Admission:  06/06/2012   Total days of antibiotics 6        (Day 4 doxy d/c'd 6/3)        Day 6 vanco        (Day 3 piptazo)   ID: Brett [name] is a 67 y.o. male with sepsis with VDRF, encephalopathy, TTP and acute renal failure  Principal Problem:   TTP (thrombotic thrombocytopenic purpura) Active Problems:   Altered mental status   Septic shock   ARF (acute renal failure)   Metabolic acidosis   Diabetes   Diarrhea   Acute respiratory failure    Subjective: Afebrile; improved plt  Medications:  . antiseptic oral rinse  15 mL Mouth Rinse QID  . calcium gluconate  2 g Intravenous Once  . chlorhexidine  15 mL Mouth Rinse BID  . doxycycline (VIBRAMYCIN) IV  100 mg Intravenous Q12H  . feeding supplement  60 mL Per Tube TID  . free water  250 mL Per Tube Q4H  . hydrALAZINE  10 mg Oral Q6H  . hydrocortisone sodium succinate  50 mg Intravenous Q6H  . insulin aspart  1-3 Units Subcutaneous Q4H  . insulin aspart  6 Units Subcutaneous Q4H  . insulin glargine  35 Units Subcutaneous Daily  . metoprolol tartrate  50 mg Oral BID  . mupirocin ointment  1 application Nasal BID  . norepinephrine (LEVOPHED) Adult infusion  2-50 mcg/min Intravenous Once  . pantoprazole sodium  40 mg Per Tube Q1200  . potassium chloride  40 mEq Oral BID  . potassium chloride  40 mEq Oral BID  . vancomycin  1,750 mg Intravenous Q24H    Objective: Vital signs in last 24 hours: Temp:  [97.4 F (36.3 C)-99.1 F (37.3 C)] 98.4 F (36.9 C) (06/04 0700) Pulse Rate:  [83-104] 86 (06/04 0822) Resp:  [0-34] 13 (06/04 0822) BP: (153-183)/(78-87) 178/83 mmHg (06/04 0822) SpO2:  [97 %-100 %] 98 % (06/04 0822) Arterial Line BP: (151-189)/(80-95) 173/82 mmHg (06/04 0700) FiO2 (%):  [40 %] 40 % (06/04 0822) Weight:  [301 lb 5.9 oz (136.7 kg)] 301 lb 5.9 oz (136.7 kg) (06/04 0230)  General appearance: awakens to voice,  Eyes: EOMI  Neck: L IJ, dressed  with pressure dressing on.  Lungs: rhonchi bilaterally  Heart: regular rate and rhythm  Abdomen: abnormal findings: distended, hypoactive bowel sounds and obese  Extremities: edema anasarca. scattered papules, scabbed, on extremities. scars from previous knee surgery.    Lab Results  Recent Labs  06/10/12 0330 06/10/12 1400 06/11/12 0448  WBC 4.9  --  4.1  HGB 8.7*  --  8.5*  HCT 26.3*  --  27.1*  NA 153* 153* 156*  K 2.9* 3.3* 3.6  CL 103 104 107  CO2 42* 41* 40*  BUN 75* 66* 63*  CREATININE 2.20* 1.84* 1.54*   Liver Panel  Recent Labs  06/09/12 0421 06/10/12 1400  ALBUMIN 2.5* 2.9*    Microbiology: 6/1 blood cx x 1 NGTD 5/30 blood cx x 2 MRSE  Studies/Results: Dg Chest Port 1 View  06/11/2012   *RADIOLOGY REPORT*  Clinical Data: Evaluate lung fields, short of breath  PORTABLE CHEST - 1 VIEW  Comparison: Prior chest x-ray 06/10/2012  Findings: Stable position of endotracheal tube 3 cm above the carina.  Left subclavian approach central venous catheter with the tip in the mid superior vena cava.  The tip of the nasogastric tube  lies below the diaphragm, likely within the stomach.  Slightly decreased interstitial edema.  Persistent pulmonary vascular congestion and cardiomegaly.  Low inspiratory volumes with left greater than right basilar opacities remain unchanged.  IMPRESSION:  1.  Slightly improved pulmonary vascular congestion 2.  Stable cardiomegaly and left greater than right basilar opacities likely reflecting atelectasis. 3.  Stable and satisfactory support apparatus   Original Report Authenticated By: Malachy Moan, M.D.   Dg Chest Port 1 View  06/10/2012   *RADIOLOGY REPORT*  Clinical Data: Endotracheal tube placement.  PORTABLE CHEST - 1 VIEW  Comparison: 1 day prior  Findings: Endotracheal tube is unchanged in position.  3.9 cm above carina.  Nasogastric extends beyond the  inferior aspect of the film.  Left subclavian line unchanged.  Cardiomegaly accentuated  by AP portable technique.  Cannot exclude small left pleural effusion. No pneumothorax.  Low lung volumes with resultant pulmonary interstitial prominence.  No congestive failure.  Probable left base atelectasis.  IMPRESSION: No significant change since one day prior.  Low lung volumes with probable left base atelectasis.  Cannot exclude small left pleural effusion.   Original Report Authenticated By: Jeronimo Greaves, M.D.     Assessment/Plan: -   RMSF serologies and Ehrlichia serologies are negative, we have other explanation for low platelets as possibly related to pradaxa toxicity. Currently off of doxycycline  - MSSE bacteremia =  Will d/c vanco and change to cefazolin treat for a minimum of 14 days. TTE does not suggest vegetations although not sensitive for evaluation of endocarditis. Consider getting TEE to evaluate for endocarditis as this would extend his current course of therapy  - mold isolated from sputum = still being identified/grown in the lab.  Hold on starting antifungal at this point until further identification from lab. Maybe a contaminant  Spent 30 min with patient with greater than 50% with coordination of care with providers/lab  Drue Second [facility] Cell: [phone] Pager: [phone]  06/11/2012, 9:22 AM

## 2012-06-11 NOTE — Progress Notes (Signed)
ANTIBIOTIC CONSULT NOTE - INITIAL  Pharmacy Consult for Ancef Indication: MSSE Bacteremia  No Known Allergies Patient Measurements: Height: 5\' 11"  (180.3 cm) Weight: 301 lb 5.9 oz (136.7 kg) IBW/kg (Calculated) : 75.3 Vital Signs: Temp: 98.8 F (37.1 C) (06/04 1300) Temp src: Core (Comment) (06/04 0800) BP: 182/84 mmHg (06/04 1102) Pulse Rate: 83 (06/04 1300) Intake/Output from previous day: 06/03 0701 - 06/04 0700 In: 5935 [I.V.:2540; ZO/XW:9604; IV Piggyback:1950] Out: 3650 [Urine:3450; Stool:200] Intake/Output from this shift: Total I/O In: 2090 [P.O.:100; I.V.:470; NG/GT:520; IV Piggyback:1000] Out: 1305 [Urine:1055; Stool:250]  Labs:  Recent Labs  06/09/12 0421  06/10/12 0330 06/10/12 1212 06/10/12 1400 06/11/12 0448  WBC 3.3*  --  4.9  --   --  4.1  HGB 7.0*  < > 8.7* 8.5*  --  8.5*  PLT 40*  --  63*  --   --  99*  CREATININE 3.25*  < > 2.20* 1.90* 1.84* 1.54*  < > = values in this interval not displayed. Estimated Creatinine Clearance: 65.8 ml/min (by C-G formula based on Cr of 1.54). No results found for this basename: VANCOTROUGH, Leodis Binet, VANCORANDOM, GENTTROUGH, GENTPEAK, GENTRANDOM, TOBRATROUGH, TOBRAPEAK, TOBRARND, AMIKACINPEAK, AMIKACINTROU, AMIKACIN,  in the last 72 hours   Microbiology: Recent Results (from the past 720 hour(s))  CULTURE, BLOOD (ROUTINE X 2)     Status: None   Collection Time    06/06/12 11:30 AM      Result Value Range Status   Specimen Description BLOOD RIGHT ANTECUBITAL   Final   Special Requests BOTTLES DRAWN AEROBIC AND ANAEROBIC 10CC   Final   Culture  Setup Time 06/06/2012 18:25   Final   Culture     Final   Value: STAPHYLOCOCCUS SPECIES (COAGULASE NEGATIVE)     Note: SUSCEPTIBILITIES PERFORMED ON PREVIOUS CULTURE WITHIN THE LAST 5 DAYS.     Note: Gram Stain Report Called to,Read Back By and Verified With: [facility] 06/07/12 @ 12:45PM BY RUSCA.   Report Status 06/10/2012 FINAL   Final  CULTURE, BLOOD (ROUTINE X 2)      Status: None   Collection Time    06/06/12 11:45 AM      Result Value Range Status   Specimen Description BLOOD RIGHT ANTECUBITAL   Final   Special Requests BOTTLES DRAWN AEROBIC ONLY 5CC   Final   Culture  Setup Time 06/06/2012 18:25   Final   Culture     Final   Value: STAPHYLOCOCCUS SPECIES (COAGULASE NEGATIVE)     Note: RIFAMPIN AND GENTAMICIN SHOULD NOT BE USED AS SINGLE DRUGS FOR TREATMENT OF STAPH INFECTIONS.     Note: Gram Stain Report Called to,Read Back By and Verified With: KATRICE KELLER 06/07/12 @ 5:38PM BY RUSCA.   Report Status 06/10/2012 FINAL   Final   Organism ID, Bacteria STAPHYLOCOCCUS SPECIES (COAGULASE NEGATIVE)   Final  URINE CULTURE     Status: None   Collection Time    06/06/12 11:59 AM      Result Value Range Status   Specimen Description URINE, CATHETERIZED   Final   Special Requests NONE   Final   Culture  Setup Time 06/06/2012 19:34   Final   Colony Count NO GROWTH   Final   Culture NO GROWTH   Final   Report Status 06/07/2012 FINAL   Final  MRSA PCR SCREENING     Status: Abnormal   Collection Time    06/06/12  2:01 PM      Result Value Range  Status   MRSA by PCR POSITIVE (*) NEGATIVE Final   Comment:            The GeneXpert MRSA Assay (FDA     approved for NASAL specimens     only), is one component of a     comprehensive MRSA colonization     surveillance program. It is not     intended to diagnose MRSA     infection nor to guide or     monitor treatment for     MRSA infections.     RESULT CALLED TO, READ BACK BY AND VERIFIED WITH:     R. FOUNTAIN RN 16:45 06/06/12 ([email or web address])  URINE CULTURE     Status: None   Collection Time    06/06/12  4:59 PM      Result Value Range Status   Specimen Description URINE, CATHETERIZED   Final   Special Requests Normal   Final   Culture  Setup Time 06/06/2012 18:12   Final   Colony Count NO GROWTH   Final   Culture NO GROWTH   Final   Report Status 06/07/2012 FINAL   Final  CULTURE, RESPIRATORY  (NON-EXPECTORATED)     Status: None   Collection Time    06/07/12 11:44 AM      Result Value Range Status   Specimen Description TRACHEAL ASPIRATE   Final   Special Requests NONE   Final   Gram Stain PENDING   Incomplete   Culture FEW Fungus isolated-identification to follow   Final   Report Status PENDING   Incomplete  STOOL CULTURE     Status: None   Collection Time    06/07/12  3:41 PM      Result Value Range Status   Specimen Description STOOL   Final   Special Requests Normal   Final   Culture     Final   Value: NO SALMONELLA, SHIGELLA, CAMPYLOBACTER, YERSINIA, OR E.COLI 0157:H7 ISOLATED     Note: REDUCED NORMAL FLORA PRESENT   Report Status 06/11/2012 FINAL   Final  CLOSTRIDIUM DIFFICILE BY PCR     Status: None   Collection Time    06/07/12  3:41 PM      Result Value Range Status   C difficile by pcr NEGATIVE  NEGATIVE Final  CULTURE, BLOOD (ROUTINE X 2)     Status: None   Collection Time    06/08/12  6:31 PM      Result Value Range Status   Specimen Description BLOOD LEFT ARM   Final   Special Requests BOTTLES DRAWN AEROBIC ONLY 6CC   Final   Culture  Setup Time 06/09/2012 02:06   Final   Culture     Final   Value:        BLOOD CULTURE RECEIVED NO GROWTH TO DATE CULTURE WILL BE HELD FOR 5 DAYS BEFORE ISSUING A FINAL NEGATIVE REPORT   Report Status PENDING   Incomplete    Medical History: Past Medical History  Diagnosis Date  . Persistent atrial fibrillation 02/2011    Negative Myoview; Normal EF by Echo  . Obesity (BMI 30-39.9)   . Hypertension   . Dyslipidemia    Assessment: 37 YOM with MSSE bacteremia on IV vancomycin day #6 now being narrowed to Ancef per ID for a minimum of 14 days. TTE does not suggest vegetations. CrCl~8mL/min. Tmax 99. WBC wnl.   Ancef 6/4 >> Vanc 5/30 >> 6/4 Zosyn 5/30 >> 6/2 Doxy 5/31 >>  5/30: Blood - CoNS (2 of 2, sensitive to Clinda, LVQ, oxacillin, tet, Vanc) 5/30: Urine - negative 5/30: Sputum - few fungus (no tx yet per ID,  await further identification) 5/30: MRSA PCR - positive  Goal of Therapy:  Clinical erradication of infection  Plan:  1. Start Ancef 2g IV q8h- Monitor renal function and need to adjust therapy. 2. Recommend TEE to evaluate for endocarditis. 3. Follow-up clinical course and cultures  Link Snuffer, PharmD, BCPS Clinical Pharmacist [phone] 06/11/2012,2:31 PM

## 2012-06-11 NOTE — Care Management Note (Signed)
    Page 1 of 2   06/19/2012     11:52:16 AM   CARE MANAGEMENT NOTE 06/19/2012  Patient:  Brett [name], Brett [name]   Account Number:  [id number]  Date Initiated:  06/06/2012  Documentation initiated by:  [facility]  Subjective/Objective Assessment:   Admitted with Sepsis.     Action/Plan:   Anticipated DC Date:  06/13/2012   Anticipated DC Plan:  HOME W HOME HEALTH SERVICES      DC Planning Services  CM consult      Choice offered to / List presented to:             Status of service:  Completed, signed off Medicare Important Message given?   (If response is "NO", the following Medicare IM given date fields will be blank) Date Medicare IM given:   Date Additional Medicare IM given:    Discharge Disposition:  LONG TERM ACUTE CARE (LTAC)  Per UR Regulation:  Reviewed for med. necessity/level of care/duration of stay  If discussed at Long Length of Stay Meetings, dates discussed:   06/12/2012  06/17/2012  06/19/2012    Comments:  ContactIVERY, NANNEY   [phone]   Tapanes,David Brother [phone]   [phone]  06-19-12 1149 Tomi Bamberger, RN, BSN [phone] CM did speak to MD in reference to [facility] and he felt like that would be a good plan. CM did speak to pt and pt chose [facility] for [facility]. CM did place a call to MD d/t bed is available today. Pt is in that window where we need to move on this quick for him to be a candidate for [facility]. RN Irving Burton is aware of plan. Awaiting call back from MD. Will f/u.  06-12-12 - 11am Avie Arenas, RNBSN [phone] Patient extubated.  Talked with patient.  Sleepy.  Confirms lives alone.  States neighbor/brother can assist on DC - PT ordered.  06/11/12 JULIE AMERSON,RN,BSN [phone] SBT TO EXTUBATE TODAY.  WILL NEED PT/OT EVALS WHEN APPROPRIATE FOR LOC RECOMMENDATIONS.

## 2012-06-11 NOTE — Progress Notes (Signed)
CRITICAL VALUE ALERT  Critical value received:  CO2 40  Date of notification:  06/11/2012  Time of notification:  0537  Critical value read back:yes  Nurse who received alert:  LvarnerRN  MD notified (1st page):  elink MD  Time of first page:  0538  MD notified (2nd page):  Time of second page:  Responding MD:  elink md   Time MD responded:  [phone]

## 2012-06-11 NOTE — Progress Notes (Signed)
PULMONARY  / CRITICAL CARE MEDICINE  Name: Brett [name] MRN: [id number] DOB: 1945-05-16    ADMISSION DATE:  06/06/2012 CONSULTATION DATE:  06/06/2012  REFERRING MD :  EDP PRIMARY SERVICE: PCCM  CHIEF COMPLAINT:  Tired and SOB  BRIEF PATIENT DESCRIPTION: 67 year old male admitted on 5/30 w/ 4 d h/o non-bloody diarrhea. Patient continued to take his ACE and pradaxa.  Presents to the ED with BP in the 60's after 3 liters of fluid.  PCCM called to admit as a code sepsis.  SIGNIFICANT EVENTS / STUDIES:  5/30 Septic shock, admit to ICU.  LINES / TUBES: L [place] TLC 5/30>>> R a-line 5/30>>> ET tube 5/31>>> L femoral trialysis catheter 5/30>>>  CULTURES: Blood 5/30>>>Coag neg staph Urine 5/30>>>neg Sputum 5/30>>>Mold  ANTIBIOTICS: Vanc 5/30>>> Zosyn 5/30>>> 6/2 Doxycycline 5/31>>>  SUBJECTIVE: Intubated.  VITAL SIGNS: Temp:  [97.4 F (36.3 C)-99 F (37.2 C)] 98.4 F (36.9 C) (06/04 0700) Pulse Rate:  [83-104] 86 (06/04 0822) Resp:  [0-34] 13 (06/04 0822) BP: (153-183)/(78-87) 178/83 mmHg (06/04 0822) SpO2:  [97 %-100 %] 98 % (06/04 0822) Arterial Line BP: (151-189)/(80-95) 173/82 mmHg (06/04 0700) FiO2 (%):  [40 %] 40 % (06/04 0822) Weight:  [136.7 kg (301 lb 5.9 oz)] 136.7 kg (301 lb 5.9 oz) (06/04 0230) HEMODYNAMICS:   VENTILATOR SETTINGS: Vent Mode:  [-] CPAP;PSV FiO2 (%):  [40 %] 40 % Set Rate:  [15 bmp] 15 bmp Vt Set:  [600 mL] 600 mL PEEP:  [5 cmH20] 5 cmH20 Pressure Support:  [8 cmH20-10 cmH20] 8 cmH20 Plateau Pressure:  [16 cmH20-17 cmH20] 17 cmH20 INTAKE / OUTPUT: Intake/Output     06/03 0701 - 06/04 0700 06/04 0701 - 06/05 0700   I.V. (mL/kg) 2540 (18.6) 115 (0.8)   Blood     NG/GT 1445 45   IV Piggyback 1950 250   Total Intake(mL/kg) 5935 (43.4) 410 (3)   Urine (mL/kg/hr) 3450 (1.1) 375 (0.7)   Stool 200 (0.1) 250 (0.5)   Total Output 3650 625   Net +2285 -215         PHYSICAL EXAMINATION: General: Chronically ill appearing obese male,   intubated. Neuro: Arousable and follows commands HEENT:  [place]/AT, PERRL, EOM-I and DMM. Cardiovascular:  IRIR, Nl S1/S2, -M/R/G. Lungs:  CTA bilaterally. Abdomen:  Soft, NT, ND and +BS. Musculoskeletal:  -edema and -tenderness. Skin:  Both lower ext with multiple lesions.  LABS:  Recent Labs Lab 06/10/12 0330 06/10/12 1400 06/11/12 0448  NA 153* 153* 156*  K 2.9* 3.3* 3.6  CL 103 104 107  CO2 42* 41* 40*  BUN 75* 66* 63*  CREATININE 2.20* 1.84* 1.54*  GLUCOSE 172* 226* 210*    Recent Labs Lab 06/09/12 0421 06/09/12 1545 06/10/12 0330 06/11/12 0448  HGB 7.0* 8.5* 8.7* 8.5*  HCT 20.6* 25.0* 26.3* 27.1*  WBC 3.3*  --  4.9 4.1  PLT 40*  --  63* 99*    Recent Labs Lab 06/06/12 1122 06/06/12 1319 06/06/12 2233  ALT 93* 71* 71*  AST 178* 142* 144*  ALKPHOS 51 38* 45  BILITOT 0.9 0.7 0.6   CXR: Low lung volumes   ASSESSMENT / PLAN:  PULMONARY A:   Respiratory failure due to poor mental status mental status has improved following plasmapheresis  OSA by history. P:   - SBT to extubate today. - IS per RT. - Titrate O2. - Early ambulation.Marland Kitchen  CARDIOVASCULAR A:  Shock likely due to hypovolemia-->now resolved no indication of infection at  this time other than possibly the legs with cellulitis.   A-fib with RVR and HTN.-->now ST Hypertension likely from volume overload, also hypertensive at baseline P:  - See ID section. - CVPs as ordered. - KVO IVF. - Increase metoprolol for HR and BP control - Increase hyralazine for BP - Cannot restart lisinopril due to AKI  RENAL A:  ARF, no history of renal disease.  Likely a combination of dehydration and ACE use. Did get 1 round of HD, now Creatinine improving and UOP acceptable.  Fluid and electrolyte imbalance/ hypokalemia  P:   - KVO IVF. - Monitor lytes closely. Replace as indicated - Diureses as BP permits. - Continue free water flush - Will start D5 at 100 mL/hr to correct free water  deficit  GASTROINTESTINAL A:  Diarrhea. P:   - Stool culture pending. - Continue TF.  HEMATOLOGIC A:   Thrombocytopenia of unknown cause.  ?TTP, ?HSP platelet count is improving with plasmapheresis  Hemorrhagic anemia: suspect multifactorial hemolysis: TTP and possibly plasmapheresis.  P:  - Peripheral smear per H/O. - Stool culture pending. - Plasmapheresis per H/O.  INFECTIOUS A:  Likely bowel or cellulitis. P:   - Cultures as above. - Abx as above.  ENDOCRINE A:  ?DM, none by history.   P:   - ISS.  NEUROLOGIC A: acute encephalopathy.  Non-focal.  ?uremia. P:   - Minimize sedative agents   TODAY'S SUMMARY: [name] has improved mental status on PS ventilation. His platelet count is improving with plasmapheresis. Will continue free water repletion. Hold diuresis today.  Extubate.  Brett [name] S-ACNP   CC time 35 min.  Patient seen and examined, agree with above note.  I dictated the care and orders written for this patient under my direction.  Alyson Reedy, MD [phone]

## 2012-06-11 NOTE — Consult Note (Signed)
[facility] INPATIENT PROGRESS NOTE  Name: Brett [name]      MRN: [id number]    Location: 2112/2112-01  Date: 06/11/2012 Time:10:39 AM   Subjective: Interval History:Brett [name] again remained intubated.  He denied problems. Nursing staff has not noticed any further bleeding.   Objective: Vital signs in last 24 hours: Temp:  [97.4 F (36.3 C)-99 F (37.2 C)] 98.4 F (36.9 C) (06/04 0700) Pulse Rate:  [83-104] 86 (06/04 0822) Resp:  [0-34] 13 (06/04 0822) BP: (153-183)/(78-87) 178/83 mmHg (06/04 0822) SpO2:  [97 %-100 %] 98 % (06/04 0822) Arterial Line BP: (151-189)/(80-95) 173/82 mmHg (06/04 0700) FiO2 (%):  [40 %] 40 % (06/04 0822) Weight:  [301 lb 5.9 oz (136.7 kg)] 301 lb 5.9 oz (136.7 kg) (06/04 0230)     PHYSICAL EXAM:  General: Obese man, no longer in rigour.  Eyes: no scleral icterus. ENT: clear oropharynx. There was no blood in his nose. Respiratory: lungs showed bibasilar crackles.  Cardiovascular: Regular rate and rhythm, S1/S2, without murmur, rub or gallop. There was no pedal edema. GI: abdomen was soft, flat, nontender, nondistended, without organomegaly.   Studies/Results: Results for orders placed during the hospital encounter of 06/06/12 (from the past 48 hour(s))  GLUCOSE, CAPILLARY     Status: Abnormal   Collection Time    06/09/12 12:23 PM      Result Value Range   Glucose-Capillary 191 (*) 70 - 99 mg/dL  THERAPEUTIC PLASMA EXCHANGE     Status: None   Collection Time    06/09/12  1:47 PM      Result Value Range   Plasma Exchange 4250 ML THAWED     Plasma volume needed 4250     Unit Number [id number]     Blood Component Type THAWED PLASMA     Unit division 00     Status of Unit ISSUED,FINAL     Transfusion Status OK TO TRANSFUSE     Unit Number [id number]     Blood Component Type THAWED PLASMA     Unit division 00     Status of Unit ISSUED,FINAL     Transfusion Status OK TO TRANSFUSE     Unit Number [id number]     Blood Component Type THAWED PLASMA     Unit division 00     Status of Unit ISSUED,FINAL     Transfusion Status OK TO TRANSFUSE     Unit Number [id number]     Blood Component Type THAWED PLASMA     Unit division 00     Status of Unit ISSUED,FINAL     Transfusion Status OK TO TRANSFUSE     Unit Number [id number]     Blood Component Type THAWED PLASMA     Unit division 00     Status of Unit ISSUED,FINAL     Transfusion Status OK TO TRANSFUSE     Unit Number [id number]     Blood Component Type THAWED PLASMA     Unit division 00     Status of Unit ISSUED,FINAL     Transfusion Status OK TO TRANSFUSE     Unit Number [id number]     Blood Component Type THAWED PLASMA     Unit division 00     Status of Unit ISSUED,FINAL     Transfusion Status OK TO TRANSFUSE     Unit Number [id number]     Blood Component Type THAWED PLASMA     Unit division 00  Status of Unit ISSUED,FINAL     Transfusion Status OK TO TRANSFUSE     Unit Number [id number]     Blood Component Type THAWED PLASMA     Unit division 00     Status of Unit ISSUED,FINAL     Transfusion Status OK TO TRANSFUSE     Unit Number [id number]     Blood Component Type THAWED PLASMA     Unit division 00     Status of Unit ISSUED,FINAL     Transfusion Status OK TO TRANSFUSE     Unit Number [id number]     Blood Component Type THAWED PLASMA     Unit division 00     Status of Unit ISSUED,FINAL     Transfusion Status OK TO TRANSFUSE     Unit Number [id number]     Blood Component Type THAWED PLASMA     Unit division 00     Status of Unit ISSUED,FINAL     Transfusion Status OK TO TRANSFUSE     Unit Number [id number]     Blood Component Type THAWED PLASMA     Unit division 00     Status of Unit ISSUED,FINAL     Transfusion Status OK TO TRANSFUSE     Unit Number [id number]     Blood Component Type THAWED PLASMA     Unit division 00     Status of Unit ISSUED,FINAL     Transfusion Status OK TO  TRANSFUSE    POCT I-STAT, CHEM 8     Status: Abnormal   Collection Time    06/09/12  3:45 PM      Result Value Range   Sodium 147 (*) 135 - 145 mEq/L   Potassium 3.0 (*) 3.5 - 5.1 mEq/L   Chloride 98  96 - 112 mEq/L   BUN 83 (*) 6 - 23 mg/dL   Creatinine, Ser 6.06 (*) 0.50 - 1.35 mg/dL   Glucose, Bld 301 (*) 70 - 99 mg/dL   Calcium, Ion 6.01  0.93 - 1.30 mmol/L   TCO2 38  0 - 100 mmol/L   Hemoglobin 8.5 (*) 13.0 - 17.0 g/dL   HCT 23.5 (*) 57.3 - 22.0 %  GLUCOSE, CAPILLARY     Status: Abnormal   Collection Time    06/09/12  4:14 PM      Result Value Range   Glucose-Capillary 180 (*) 70 - 99 mg/dL  BASIC METABOLIC PANEL     Status: Abnormal   Collection Time    06/09/12  8:00 PM      Result Value Range   Sodium 150 (*) 135 - 145 mEq/L   Potassium 2.8 (*) 3.5 - 5.1 mEq/L   Chloride 100  96 - 112 mEq/L   CO2 40 (*) 19 - 32 mEq/L   Comment: CRITICAL RESULT CALLED TO, READ BACK BY AND VERIFIED WITH:     D HOLVONDER,RN 2146 06/09/12 WBOND   Glucose, Bld 181 (*) 70 - 99 mg/dL   BUN 77 (*) 6 - 23 mg/dL   Creatinine, Ser 2.54 (*) 0.50 - 1.35 mg/dL   Calcium 9.3  8.4 - 27.0 mg/dL   GFR calc non Af Amer 26 (*) >90 mL/min   GFR calc Af Amer 30 (*) >90 mL/min   Comment:            The eGFR has been calculated     using the CKD EPI equation.     This calculation has not been  validated in all clinical     situations.     eGFR's persistently     <90 mL/min signify     possible Chronic Kidney Disease.  GLUCOSE, CAPILLARY     Status: Abnormal   Collection Time    06/09/12  8:43 PM      Result Value Range   Glucose-Capillary 182 (*) 70 - 99 mg/dL  GLUCOSE, CAPILLARY     Status: Abnormal   Collection Time    06/09/12 11:03 PM      Result Value Range   Glucose-Capillary 186 (*) 70 - 99 mg/dL  GLUCOSE, CAPILLARY     Status: Abnormal   Collection Time    06/10/12 12:20 AM      Result Value Range   Glucose-Capillary 170 (*) 70 - 99 mg/dL   Comment 1 Documented in Chart      Comment 2 Notify RN    BASIC METABOLIC PANEL     Status: Abnormal   Collection Time    06/10/12  3:30 AM      Result Value Range   Sodium 153 (*) 135 - 145 mEq/L   Potassium 2.9 (*) 3.5 - 5.1 mEq/L   Chloride 103  96 - 112 mEq/L   CO2 42 (*) 19 - 32 mEq/L   Comment: CRITICAL RESULT CALLED TO, READ BACK BY AND VERIFIED WITH:     GARNER L,RN 06/10/12 0449 WAYK   Glucose, Bld 172 (*) 70 - 99 mg/dL   BUN 75 (*) 6 - 23 mg/dL   Creatinine, Ser 1.61 (*) 0.50 - 1.35 mg/dL   Calcium 9.3  8.4 - 09.6 mg/dL   GFR calc non Af Amer 29 (*) >90 mL/min   GFR calc Af Amer 34 (*) >90 mL/min   Comment:            The eGFR has been calculated     using the CKD EPI equation.     This calculation has not been     validated in all clinical     situations.     eGFR's persistently     <90 mL/min signify     possible Chronic Kidney Disease.  MAGNESIUM     Status: None   Collection Time    06/10/12  3:30 AM      Result Value Range   Magnesium 1.7  1.5 - 2.5 mg/dL  PROTIME-INR     Status: Abnormal   Collection Time    06/10/12  3:30 AM      Result Value Range   Prothrombin Time 15.8 (*) 11.6 - 15.2 seconds   INR 1.29  0.00 - 1.49  APTT     Status: Abnormal   Collection Time    06/10/12  3:30 AM      Result Value Range   aPTT 39 (*) 24 - 37 seconds   Comment:            IF BASELINE aPTT IS ELEVATED,     SUGGEST PATIENT RISK ASSESSMENT     BE USED TO DETERMINE APPROPRIATE     ANTICOAGULANT THERAPY.  CBC     Status: Abnormal   Collection Time    06/10/12  3:30 AM      Result Value Range   WBC 4.9  4.0 - 10.5 K/uL   RBC 3.14 (*) 4.22 - 5.81 MIL/uL   Hemoglobin 8.7 (*) 13.0 - 17.0 g/dL   Comment: POST TRANSFUSION SPECIMEN   HCT 26.3 (*) 39.0 -  52.0 %   MCV 83.8  78.0 - 100.0 fL   MCH 27.7  26.0 - 34.0 pg   MCHC 33.1  30.0 - 36.0 g/dL   RDW 16.1  09.6 - 04.5 %   Platelets 63 (*) 150 - 400 K/uL   Comment: CONSISTENT WITH PREVIOUS RESULT  PHOSPHORUS     Status: None   Collection Time     06/10/12  3:30 AM      Result Value Range   Phosphorus 3.0  2.3 - 4.6 mg/dL  GLUCOSE, CAPILLARY     Status: Abnormal   Collection Time    06/10/12  3:50 AM      Result Value Range   Glucose-Capillary 164 (*) 70 - 99 mg/dL   Comment 1 Documented in Chart     Comment 2 Notify RN    GLUCOSE, CAPILLARY     Status: Abnormal   Collection Time    06/10/12  7:23 AM      Result Value Range   Glucose-Capillary 160 (*) 70 - 99 mg/dL  TYPE AND SCREEN     Status: None   Collection Time    06/10/12  8:45 AM      Result Value Range   ABO/RH(D) O POS     Antibody Screen NEG     Sample Expiration 06/13/2012    THERAPEUTIC PLASMA EXCHANGE     Status: None   Collection Time    06/10/12  9:02 AM      Result Value Range   Plasma Exchange 3956 ML THAWED     Plasma volume needed 3950     Unit Number [id number]     Blood Component Type THAWED PLASMA     Unit division 00     Status of Unit ISSUED,FINAL     Transfusion Status OK TO TRANSFUSE     Unit Number [id number]     Blood Component Type THAWED PLASMA     Unit division 00     Status of Unit ISSUED,FINAL     Transfusion Status OK TO TRANSFUSE     Unit Number [id number]     Blood Component Type THAWED PLASMA     Unit division 00     Status of Unit ISSUED,FINAL     Transfusion Status OK TO TRANSFUSE     Unit Number [id number]     Blood Component Type THAWED PLASMA     Unit division 00     Status of Unit ISSUED,FINAL     Transfusion Status OK TO TRANSFUSE     Unit Number [id number]     Blood Component Type THAWED PLASMA     Unit division 00     Status of Unit ISSUED,FINAL     Transfusion Status OK TO TRANSFUSE     Unit Number [id number]     Blood Component Type THAWED PLASMA     Unit division 00     Status of Unit ISSUED,FINAL     Transfusion Status OK TO TRANSFUSE     Unit Number [id number]     Blood Component Type THAWED PLASMA     Unit division 00     Status of Unit ISSUED,FINAL     Transfusion Status OK  TO TRANSFUSE     Unit Number [id number]     Blood Component Type THAWED PLASMA     Unit division 00     Status of Unit ISSUED,FINAL     Transfusion Status OK TO TRANSFUSE  Unit Number [id number]     Blood Component Type THAWED PLASMA     Unit division 00     Status of Unit ISSUED,FINAL     Transfusion Status OK TO TRANSFUSE     Unit Number [id number]     Blood Component Type THAWED PLASMA     Unit division 00     Status of Unit ISSUED,FINAL     Transfusion Status OK TO TRANSFUSE     Unit Number [id number]     Blood Component Type THAWED PLASMA     Unit division 00     Status of Unit ISSUED,FINAL     Transfusion Status OK TO TRANSFUSE     Unit Number [id number]     Blood Component Type THAWED PLASMA     Unit division 00     Status of Unit ISSUED,FINAL     Transfusion Status OK TO TRANSFUSE    GLUCOSE, CAPILLARY     Status: Abnormal   Collection Time    06/10/12 11:18 AM      Result Value Range   Glucose-Capillary 128 (*) 70 - 99 mg/dL  RENAL FUNCTION PANEL     Status: Abnormal   Collection Time    06/10/12  2:00 PM      Result Value Range   Sodium 153 (*) 135 - 145 mEq/L   Potassium 3.3 (*) 3.5 - 5.1 mEq/L   Chloride 104  96 - 112 mEq/L   CO2 41 (*) 19 - 32 mEq/L   Comment: CRITICAL RESULT CALLED TO, READ BACK BY AND VERIFIED WITH:     K.MERCER,RN 06/10/12 1457 BY BSLADE   Glucose, Bld 226 (*) 70 - 99 mg/dL   BUN 66 (*) 6 - 23 mg/dL   Creatinine, Ser 7.25 (*) 0.50 - 1.35 mg/dL   Calcium 9.5  8.4 - 36.6 mg/dL   Phosphorus 3.0  2.3 - 4.6 mg/dL   Albumin 2.9 (*) 3.5 - 5.2 g/dL   GFR calc non Af Amer 36 (*) >90 mL/min   GFR calc Af Amer 42 (*) >90 mL/min   Comment:            The eGFR has been calculated     using the CKD EPI equation.     This calculation has not been     validated in all clinical     situations.     eGFR's persistently     <90 mL/min signify     possible Chronic Kidney Disease.  GLUCOSE, CAPILLARY     Status: Abnormal    Collection Time    06/10/12  3:27 PM      Result Value Range   Glucose-Capillary 205 (*) 70 - 99 mg/dL  GLUCOSE, CAPILLARY     Status: Abnormal   Collection Time    06/10/12  8:09 PM      Result Value Range   Glucose-Capillary 192 (*) 70 - 99 mg/dL  GLUCOSE, CAPILLARY     Status: Abnormal   Collection Time    06/11/12 12:21 AM      Result Value Range   Glucose-Capillary 251 (*) 70 - 99 mg/dL   Comment 1 Documented in Chart     Comment 2 Notify RN    GLUCOSE, CAPILLARY     Status: Abnormal   Collection Time    06/11/12  3:49 AM      Result Value Range   Glucose-Capillary 205 (*) 70 - 99 mg/dL   Comment 1 Documented in  Chart     Comment 2 Notify RN    BASIC METABOLIC PANEL     Status: Abnormal   Collection Time    06/11/12  4:48 AM      Result Value Range   Sodium 156 (*) 135 - 145 mEq/L   Potassium 3.6  3.5 - 5.1 mEq/L   Chloride 107  96 - 112 mEq/L   CO2 40 (*) 19 - 32 mEq/L   Comment: CRITICAL RESULT CALLED TO, READ BACK BY AND VERIFIED WITH:     VARNER L,RN 06/11/12 0536 WAYK   Glucose, Bld 210 (*) 70 - 99 mg/dL   BUN 63 (*) 6 - 23 mg/dL   Creatinine, Ser 1.61 (*) 0.50 - 1.35 mg/dL   Calcium 9.3  8.4 - 09.6 mg/dL   GFR calc non Af Amer 45 (*) >90 mL/min   GFR calc Af Amer 52 (*) >90 mL/min   Comment:            The eGFR has been calculated     using the CKD EPI equation.     This calculation has not been     validated in all clinical     situations.     eGFR's persistently     <90 mL/min signify     possible Chronic Kidney Disease.  MAGNESIUM     Status: None   Collection Time    06/11/12  4:48 AM      Result Value Range   Magnesium 1.8  1.5 - 2.5 mg/dL  PHOSPHORUS     Status: None   Collection Time    06/11/12  4:48 AM      Result Value Range   Phosphorus 2.6  2.3 - 4.6 mg/dL  CBC     Status: Abnormal   Collection Time    06/11/12  4:48 AM      Result Value Range   WBC 4.1  4.0 - 10.5 K/uL   RBC 3.11 (*) 4.22 - 5.81 MIL/uL   Hemoglobin 8.5 (*) 13.0 -  17.0 g/dL   HCT 04.5 (*) 40.9 - 81.1 %   MCV 87.1  78.0 - 100.0 fL   MCH 27.3  26.0 - 34.0 pg   MCHC 31.4  30.0 - 36.0 g/dL   RDW 91.4  78.2 - 95.6 %   Platelets 99 (*) 150 - 400 K/uL   Comment: CONSISTENT WITH PREVIOUS RESULT  GLUCOSE, CAPILLARY     Status: Abnormal   Collection Time    06/11/12  8:21 AM      Result Value Range   Glucose-Capillary 189 (*) 70 - 99 mg/dL   Dg Chest Port 1 View  06/11/2012   *RADIOLOGY REPORT*  Clinical Data: Evaluate lung fields, short of breath  PORTABLE CHEST - 1 VIEW  Comparison: Prior chest x-ray 06/10/2012  Findings: Stable position of endotracheal tube 3 cm above the carina.  Left subclavian approach central venous catheter with the tip in the mid superior vena cava.  The tip of the nasogastric tube lies below the diaphragm, likely within the stomach.  Slightly decreased interstitial edema.  Persistent pulmonary vascular congestion and cardiomegaly.  Low inspiratory volumes with left greater than right basilar opacities remain unchanged.  IMPRESSION:  1.  Slightly improved pulmonary vascular congestion 2.  Stable cardiomegaly and left greater than right basilar opacities likely reflecting atelectasis. 3.  Stable and satisfactory support apparatus   Original Report Authenticated By: Malachy Moan, M.D.   Dg Chest [facility]  1 View  06/10/2012   *RADIOLOGY REPORT*  Clinical Data: Endotracheal tube placement.  PORTABLE CHEST - 1 VIEW  Comparison: 1 day prior  Findings: Endotracheal tube is unchanged in position.  3.9 cm above carina.  Nasogastric extends beyond the  inferior aspect of the film.  Left subclavian line unchanged.  Cardiomegaly accentuated by AP portable technique.  Cannot exclude small left pleural effusion. No pneumothorax.  Low lung volumes with resultant pulmonary interstitial prominence.  No congestive failure.  Probable left base atelectasis.  IMPRESSION: No significant change since one day prior.  Low lung volumes with probable left base  atelectasis.  Cannot exclude small left pleural effusion.   Original Report Authenticated By: Jeronimo Greaves, M.D.     MEDICATIONS: reviewed.     PROBLEM LIST:  1. Afib; was on Pradaxa  2. Sepsis with blood culture x 2 Staph coag neg; and sputum with fungal species.  3. Acute renal failure  4. Fever  5. Acute anemia  6. Thrombocytopenia  7. Coagulopathy.     IMPRESSION:   - Renal function improved with hemodialysis x 1 on 06/07/12.  He is making urine with improving Cr.  - His systemic bleeding has resolved with dialysis to remove Pradaxa. - His presentation is consistent with sepsis and Pradaxa toxicity.  However, It is not possible to diagnose or rule out TTP.  [id number] is due to be out on Thursday 06/12/12.  Given the fact that his anemia and thrombocytopenia have significantly improved, I would recommend treating this as presumed TTP.    RECOMMENDATIONS:   - Continue with empiric antibiotics.  - Continue with daily plasmapheresis until ADAMTS 13 returns tomorrow.  - Risk to watch out for transient worsening in anemia, hypocalcemia, rigor, transfusion-related lung injury.  - Continue to monitor CBC bid with transfusion for Hct <20; platelet <10 or active profuse bleeding.

## 2012-06-11 NOTE — Progress Notes (Signed)
Plasma exchange tolerated well without adverse events noted.  Vital signs stable and pt is currently without complaint.  Report given to Verdia Kuba, RN.

## 2012-06-12 ENCOUNTER — Inpatient Hospital Stay (HOSPITAL_COMMUNITY): Payer: Medicare Other

## 2012-06-12 LAB — THERAPEUTIC PLASMA EXCHANGE (BLOOD BANK)
Unit division: 0
Unit division: 0
Unit division: 0
Unit division: 0
Unit division: 0
Unit division: 0
Unit division: 0

## 2012-06-12 LAB — GLUCOSE, CAPILLARY
Glucose-Capillary: 138 mg/dL — ABNORMAL HIGH (ref 70–99)
Glucose-Capillary: 138 mg/dL — ABNORMAL HIGH (ref 70–99)
Glucose-Capillary: 162 mg/dL — ABNORMAL HIGH (ref 70–99)

## 2012-06-12 LAB — BASIC METABOLIC PANEL
BUN: 45 mg/dL — ABNORMAL HIGH (ref 6–23)
CO2: 41 mEq/L (ref 19–32)
CO2: 35 mEq/L — ABNORMAL HIGH (ref 19–32)
Chloride: 101 mEq/L (ref 96–112)
Chloride: 99 mEq/L (ref 96–112)
GFR calc Af Amer: 68 mL/min — ABNORMAL LOW (ref >90)
Glucose, Bld: 159 mg/dL — ABNORMAL HIGH (ref 70–99)
Potassium: 2.9 mEq/L — ABNORMAL LOW (ref 3.5–5.1)
Potassium: 3.6 mEq/L (ref 3.5–5.1)
Sodium: 141 mEq/L (ref 135–145)

## 2012-06-12 LAB — PROTIME-INR
INR: 1.17 (ref 0.00–1.49)
Prothrombin Time: 14.7 seconds (ref 11.6–15.2)

## 2012-06-12 LAB — MAGNESIUM: Magnesium: 1.5 mg/dL (ref 1.5–2.5)

## 2012-06-12 LAB — CBC
Hemoglobin: 8.4 g/dL — ABNORMAL LOW (ref 13.0–17.0)
MCH: 27.6 pg (ref 26.0–34.0)
MCHC: 31.5 g/dL (ref 30.0–36.0)
MCV: 87.8 fL (ref 78.0–100.0)
RBC: 3.04 MIL/uL — ABNORMAL LOW (ref 4.22–5.81)

## 2012-06-12 LAB — CALCIUM, IONIZED: Calcium, Ion: 1.14 mmol/L (ref 1.13–1.30)

## 2012-06-12 LAB — MISCELLANEOUS TEST

## 2012-06-12 MED ORDER — SODIUM CHLORIDE 0.9 % IV SOLN
6.0000 g | Freq: Once | INTRAVENOUS | Status: AC
  Administered 2012-06-12: 6 g via INTRAVENOUS
  Filled 2012-06-12: qty 12

## 2012-06-12 MED ORDER — CALCIUM GLUCONATE 10 % IV SOLN
2.0000 g | Freq: Once | INTRAVENOUS | Status: DC
  Filled 2012-06-12: qty 20

## 2012-06-12 MED ORDER — POTASSIUM CHLORIDE CRYS ER 20 MEQ PO TBCR
40.0000 meq | EXTENDED_RELEASE_TABLET | ORAL | Status: DC
  Administered 2012-06-12: 40 meq via ORAL
  Filled 2012-06-12: qty 2

## 2012-06-12 MED ORDER — POTASSIUM CHLORIDE 20 MEQ/15ML (10%) PO LIQD
40.0000 meq | ORAL | Status: AC
  Administered 2012-06-12 (×3): 40 meq via ORAL
  Filled 2012-06-12 (×3): qty 30

## 2012-06-12 MED ORDER — METHYLPREDNISOLONE SODIUM SUCC 125 MG IJ SOLR
80.0000 mg | Freq: Once | INTRAMUSCULAR | Status: DC
  Filled 2012-06-12: qty 1.28

## 2012-06-12 MED ORDER — ACD FORMULA A 0.73-2.45-2.2 GM/100ML VI SOLN
500.0000 mL | Status: DC
  Filled 2012-06-12: qty 500

## 2012-06-12 MED ORDER — ACETAMINOPHEN 325 MG PO TABS: 650.0000 mg | ORAL_TABLET | ORAL | Status: DC | PRN

## 2012-06-12 MED ORDER — ANTICOAGULANT SODIUM CITRATE 4% (200MG/5ML) IV SOLN
5.0000 mL | Freq: Once | Status: DC
  Filled 2012-06-12 (×2): qty 250

## 2012-06-12 MED ORDER — PANTOPRAZOLE SODIUM 40 MG PO TBEC
40.0000 mg | DELAYED_RELEASE_TABLET | Freq: Every day | ORAL | Status: DC
  Administered 2012-06-12 – 2012-06-16 (×5): 40 mg via ORAL
  Filled 2012-06-12 (×5): qty 1

## 2012-06-12 MED ORDER — DEXTROSE 5 % IV SOLN
INTRAVENOUS | Status: DC
  Administered 2012-06-12: 18:00:00 via INTRAVENOUS
  Administered 2012-06-14: 500 mL via INTRAVENOUS

## 2012-06-12 MED ORDER — SODIUM CHLORIDE 0.9 % IV SOLN
4.0000 g | Freq: Once | INTRAVENOUS | Status: DC
  Filled 2012-06-12: qty 40

## 2012-06-12 MED ORDER — HYDRALAZINE HCL 50 MG PO TABS
50.0000 mg | ORAL_TABLET | Freq: Four times a day (QID) | ORAL | Status: DC
  Administered 2012-06-12 – 2012-06-13 (×4): 50 mg via ORAL
  Filled 2012-06-12 (×8): qty 1

## 2012-06-12 MED ORDER — DIPHENHYDRAMINE HCL 25 MG PO CAPS
25.0000 mg | ORAL_CAPSULE | Freq: Four times a day (QID) | ORAL | Status: DC | PRN
Start: 2012-06-12 — End: 2012-06-12

## 2012-06-12 NOTE — Progress Notes (Signed)
CRITICAL VALUE ALERT  Critical value received:  CO2=41  Date of notification:  06/12/12  Time of notification:  0627  Critical value read back:yes  Nurse who received alert:  Dustin Flock RN  MD notified (1st page):  Dr Bea Laura Deterding  Time of first page:  [phone]  MD notified (2nd page):  Time of second page:  Responding MD:  DR Deterding  Time MD responded:  [phone]

## 2012-06-12 NOTE — Evaluation (Signed)
Physical Therapy Evaluation Patient Details Name: Brett [name] MRN: [id number] DOB: [birth year] Today's Date: 06/12/2012 Time: 1132-1206 PT Time Calculation (min): 34 min  PT Assessment / Plan / Recommendation Clinical Impression  Pt is a is a 67 y.o. male with sepsis with VDRF, encephalopathy, TTP and acute renal failure. Pt demonstrates deficits in functional mobility as idicated below. Feel patient will benefit from skilled PT to address deficits and maximize functional mobility with the hopes to dc home with HHPT; however, patient may need short term rehab prior to discharge if significant progress is not made acutely. Will continue to see as indicated.    PT Assessment  Patient needs continued PT services    Follow Up Recommendations  Home health PT;Supervision - Intermittent       Barriers to Discharge Decreased caregiver support      Equipment Recommendations  None recommended by PT       Frequency Min 3X/week    Precautions / Restrictions Restrictions Weight Bearing Restrictions: No   Pertinent Vitals/Pain Pt reports no pain at this time; SpO2 desaturated with activity to 89%      Mobility  Bed Mobility Bed Mobility: Supine to Sit;Sitting - Scoot to Edge of Bed Supine to Sit: 3: Mod assist Sitting - Scoot to Edge of Bed: 3: Mod assist Details for Bed Mobility Assistance: Assist to elevate trunk and assist with chuck pad to roatate hips to EOB Transfers Transfers: Sit to Stand;Stand to Sit;Stand Pivot Transfers Sit to Stand: 1: +2 Total assist Sit to Stand: Patient Percentage: 60% Stand to Sit: 1: +2 Total assist;With upper extremity assist Stand to Sit: Patient Percentage: 70% Stand Pivot Transfers: 1: +2 Total assist Stand Pivot Transfers: Patient Percentage: 70% Details for Transfer Assistance: VCs for hand placement; assist to elevate and control descent.  Ambulation/Gait Ambulation/Gait Assistance: 1: +2 Total assist Ambulation/Gait: Patient  Percentage: 70% Assistive device: Rolling walker General Gait Details: will assess further    Exercises Total Joint Exercises Marching in Standing: Both;20 reps General Exercises - Lower Extremity Ankle Circles/Pumps: AROM;Both;20 reps Long Arc Quad: AROM;Both;10 reps   PT Diagnosis: Difficulty walking;Abnormality of gait;Generalized weakness  PT Problem List: Decreased strength;Decreased activity tolerance;Decreased balance;Decreased mobility;Obesity PT Treatment Interventions: DME instruction;Gait training;Stair training;Functional mobility training;Therapeutic activities;Therapeutic exercise;Balance training;Patient/family education   PT Goals Acute Rehab PT Goals PT Goal Formulation: With patient Time For Goal Achievement: 06/26/12 Potential to Achieve Goals: Good Pt will go Supine/Side to Sit: with modified independence Pt will go Sit to Stand: with modified independence Pt will Ambulate: >150 feet;with modified independence Pt will Go Up / Down Stairs: 3-5 stairs;with min assist  Visit Information  Last PT Received On: 06/12/12 Assistance Needed: +2    Subjective Data  Subjective: Getting up sounds great Patient Stated Goal: to go home   Prior Functioning  Home Living Lives With: Alone Available Help at Discharge: Family Type of Home: House Home Access: Stairs to enter Secretary/administrator of Steps: 3 Entrance Stairs-Rails: Right;Left Home Layout: One level Bathroom Shower/Tub: Engineer, manufacturing systems: Standard Prior Function Level of Independence: Independent Able to Take Stairs?: Yes Driving: Yes Communication Communication: No difficulties Dominant Hand: Right    Cognition  Cognition Arousal/Alertness: Awake/alert Behavior During Therapy: WFL for tasks assessed/performed Overall Cognitive Status: Within Functional Limits for tasks assessed    Extremity/Trunk Assessment Right Upper Extremity Assessment RUE ROM/Strength/Tone: WFL for tasks  assessed RUE Sensation: WFL - Light Touch;WFL - Proprioception RUE Coordination: Deficits RUE Coordination Deficits: tremor at baseline Left  Upper Extremity Assessment LUE ROM/Strength/Tone: WFL for tasks assessed LUE Sensation: WFL - Light Touch;WFL - Proprioception Right Lower Extremity Assessment RLE ROM/Strength/Tone: WFL for tasks assessed RLE ROM/Strength/Tone Deficits: WFL - Light Touch;WFL - Proprioception RLE Sensation: History of peripheral neuropathy Left Lower Extremity Assessment LLE ROM/Strength/Tone: WFL for tasks assessed LLE ROM/Strength/Tone Deficits: WFL - Light Touch;WFL - Proprioception LLE Sensation: History of peripheral neuropathy   Balance Balance Balance Assessed: Yes Static Sitting Balance Static Sitting - Balance Support: Feet supported Static Sitting - Level of Assistance: 7: Independent Static Standing Balance Static Standing - Balance Support: Bilateral upper extremity supported;During functional activity Static Standing - Level of Assistance: 1: +2 Total assist  End of Session PT - End of Session Activity Tolerance: Patient limited by fatigue Patient left: in chair;with call bell/phone within reach Nurse Communication: Mobility status  GP     Fabio Asa 06/12/2012, 3:14 PM Charlotte Crumb, PT DPT  [phone]

## 2012-06-12 NOTE — Progress Notes (Addendum)
PULMONARY  / CRITICAL CARE MEDICINE  Name: Brett [name] MRN: [id number] DOB: 1945/06/15    ADMISSION DATE:  06/06/2012 CONSULTATION DATE:  06/06/2012  REFERRING MD :  EDP PRIMARY SERVICE: PCCM  CHIEF COMPLAINT:  Tired and SOB  BRIEF PATIENT DESCRIPTION: 67 year old male admitted on 5/30 w/ 4 d h/o non-bloody diarrhea. Patient continued to take his ACE and pradaxa.  Presents to the ED with BP in the 60's after 3 liters of fluid.  PCCM called to admit as a code sepsis.  SIGNIFICANT EVENTS / STUDIES:  5/30 Septic shock, admit to ICU. 5/30 ECHO EF 60%  LINES / TUBES: L [place] TLC 5/30>>> R a-line 5/30>>>6/4 ET tube 5/31>>>6/4 L femoral trialysis catheter 5/30>>>  CULTURES: Blood 5/30>>>Coag neg staph Urine 5/30>>>neg Sputum 5/30>>>Mold  ANTIBIOTICS: Vanc 5/30>>>6/4 Zosyn 5/30>>> 6/2 Doxycycline 5/31>>> Ancef 6/4>>>  SUBJECTIVE:   Feels better  VITAL SIGNS: Temp:  [94.2 F (34.6 C)-98.9 F (37.2 C)] 97.8 F (36.6 C) (06/05 0800) Pulse Rate:  [75-120] 95 (06/05 0800) Resp:  [8-23] 14 (06/05 0800) BP: (133-182)/(76-125) 172/94 mmHg (06/05 0800) SpO2:  [94 %-100 %] 100 % (06/05 0800) Arterial Line BP: (172-190)/(79-90) 183/87 mmHg (06/04 1415) FiO2 (%):  [40 %] 40 % (06/04 1102) Weight:  [137.4 kg (302 lb 14.6 oz)] 137.4 kg (302 lb 14.6 oz) (06/05 0500) HEMODYNAMICS: CVP:  [10 mmHg-11 mmHg] 10 mmHg  3 L [place]  INTAKE / OUTPUT: Intake/Output     06/04 0701 - 06/05 0700 06/05 0701 - 06/06 0700   P.O. 955    I.V. (mL/kg) 1854.3 (13.5) 100 (0.7)   NG/GT 565    IV Piggyback 2190    Total Intake(mL/kg) 5564.3 (40.5) 100 (0.7)   Urine (mL/kg/hr) 5155 (1.6)    Stool 710 (0.2)    Total Output 5865     Net -300.7 +100         PHYSICAL EXAMINATION: General: Chronically ill appearing obese male Neuro: A&Ox3, follows commands HEENT:  [place]/AT, PERRL, EOM-I and DMM. Cardiovascular:  IRIR, Nl S1/S2, -M/R/G. Lungs:  CTA bilaterally. Abdomen:  Soft, NT, ND and  +BS. Musculoskeletal:  -edema and -tenderness. Skin:  Both lower ext with multiple lesions.  LABS:  Recent Labs Lab 06/11/12 0448 06/11/12 1300 06/12/12 0440  NA 156* 153* 148*  K 3.6 3.6 2.9*  CL 107 107 101  CO2 40* 41* 41*  BUN 63* 58* 45*  CREATININE 1.54* 1.44* 1.23  GLUCOSE 210* 197* 123*    Recent Labs Lab 06/10/12 0330 06/10/12 1212 06/11/12 0448 06/12/12 0440  HGB 8.7* 8.5* 8.5* 8.4*  HCT 26.3* 25.0* 27.1* 26.7*  WBC 4.9  --  4.1 6.1  PLT 63*  --  99* 108*    Recent Labs Lab 06/06/12 1122 06/06/12 1319 06/06/12 2233  ALT 93* 71* 71*  AST 178* 142* 144*  ALKPHOS 51 38* 45  BILITOT 0.9 0.7 0.6   CXR: Low lung volumes   ASSESSMENT / PLAN:  PULMONARY A:   Respiratory failure due to poor mental status now resolved  OSA by history. P:   - Titrate O2. - Early ambulation.. - Continue IS - Consider nocturnal CPAP for OSA  CARDIOVASCULAR A:  Shock likely due to hypovolemia-->now resolved no indication of infection at this time other than possibly the legs with cellulitis.   A-fib with RVR and HTN.-->now ST Hypertension likely from volume overload, also hypertensive at baseline P:  - See ID section. - KVO IVF. - Increase hyralazine for  BP - Cannot restart lisinopril due to AKI  RENAL A:  ARF, no history of renal disease.  Likely a combination of dehydration and ACE use. Did get 1 round of HD, now Creatinine improving and UOP acceptable.  Fluid and electrolyte imbalance/ hypokalemia  Hypernatremia likely a combination of albumin use with plasmaphersis and aggressive IV diuretics P:   - KVO IVF. - Monitor lytes closely. Replace as indicated - Diureses as BP permits. - Will decrease D5 to 50 mL/hr to correct free water deficit - Patient is able to take PO fluids, will encourage water consumption  GASTROINTESTINAL A:  Diarrhea. P:   - Stool culture pending. - Continue TF.  HEMATOLOGIC A:   Thrombocytopenia of unknown cause.  ?TTP, ?HSP  platelet count is improving with plasmapheresis  Hemorrhagic anemia: suspect multifactorial hemolysis: TTP and possibly plasmapheresis.  P:  - Peripheral smear per H/O. - Stool culture pending. - Plasmapheresis per H/O.  INFECTIOUS A:  Likely bowel or cellulitis. P:   - Cultures as above. - Abx as above.  ENDOCRINE A:  ?DM, none by history.   P:   - ISS.  NEUROLOGIC A: acute encephalopathy.  Non-focal.  ?uremia now resolved  P:   - Minimize sedative agents   TODAY'S SUMMARY: Brett [name] has been stable overnight on nasal cannula. His AKI is resolving with good urine output. His sodium is improving with free water. Will continue to maximize oral agents for BP control.   Brett [name] S-ACNP   ID recommended TEE, patient is improving, I am concerned to sedate him right now for a TEE.  Maybe if improves would consider TEE when mental status is more improved.  Transfer to SDU and to [facility], PCCM will sign off, please call back if needed.  Patient seen and examined, agree with above note.  I dictated the care and orders written for this patient under my direction.  Alyson Reedy, MD [phone]

## 2012-06-12 NOTE — Progress Notes (Signed)
Patient ID: IAIN SAWCHUK, male   DOB: 11/22/1945, 67 y.o.   MRN: [id number] S:extubated and feels much better O:BP 172/94  Pulse 95  Temp(Src) 97.8 F (36.6 C) (Core (Comment))  Resp 14  Ht 5\' 11"  (1.803 m)  Wt 137.4 kg (302 lb 14.6 oz)  BMI 42.27 kg/m2  SpO2 100%  Intake/Output Summary (Last 24 hours) at 06/12/12 0905 Last data filed at 06/12/12 0800  Gross per 24 hour  Intake 5209.3 ml  Output   5240 ml  Net  -30.7 ml   Intake/Output: I/O last 3 completed shifts: In: 7534.3 [P.O.:955; I.V.:3234.3; NG/GT:1105; IV Piggyback:2240] Out: 7715 [Urine:7005; Stool:710]  Intake/Output this shift:  Total I/O In: 100 [I.V.:100] Out: -  Weight change: 0.7 kg (1 lb 8.7 oz) Gen:WD obese WM in NAD CVS:no rub Resp:CTA WUJ:[id number] Ext:tr edema   Recent Labs Lab 06/06/12 1122 06/06/12 1319 06/06/12 2233  06/07/12 0430  06/08/12 0353  06/09/12 0421  06/09/12 2000 06/10/12 0330 06/10/12 1212 06/10/12 1400 06/11/12 0448 06/11/12 1300 06/12/12 0440  NA 125* 128* 134*  < > 135  < > 138  < > 143  < > 150* 153* 151* 153* 156* 153* 148*  K 3.9 3.4* 3.6  < > 3.2*  < > 3.0*  < > 3.0*  < > 2.8* 2.9* 3.6 3.3* 3.6 3.6 2.9*  CL 88* 98 96  < > 97  < > 94*  < > 99  < > 100 103 99 104 107 107 101  CO2 13* 10* 16*  < > 20  --  30  < > 36*  --  40* 42*  --  41* 40* 41* 41*  GLUCOSE 153* 132* 192*  < > 161*  < > 219*  < > 161*  < > 181* 172* 140* 226* 210* 197* 123*  BUN 124* 117* 124*  < > 129*  < > 85*  < > 83*  < > 77* 75* 72* 66* 63* 58* 45*  CREATININE 11.11* 10.03* 9.60*  < > 9.46*  < > 4.83*  < > 3.25*  < > 2.42* 2.20* 1.90* 1.84* 1.54* 1.44* 1.23  ALBUMIN 2.9* 2.2* 2.4*  --  2.3*  --   --   --  2.5*  --   --   --   --  2.9*  --   --   --   CALCIUM 8.1* 6.6* 7.2*  < > 7.1*  --  7.5*  < > 8.6  --  9.3 9.3  --  9.5 9.3 8.8 9.1  PHOS  --   --   --   < > 5.5*  --  4.5  --  4.0  --   --  3.0  --  3.0 2.6  --  3.4  AST 178* 142* 144*  --   --   --   --   --   --   --   --   --   --   --    --   --   --   ALT 93* 71* 71*  --   --   --   --   --   --   --   --   --   --   --   --   --   --   < > = values in this interval not displayed. Liver Function Tests:  Recent Labs Lab 06/06/12 1122 06/06/12 1319 06/06/12 2233 06/07/12 0430 06/09/12  0421 06/10/12 1400  AST 178* 142* 144*  --   --   --   ALT 93* 71* 71*  --   --   --   ALKPHOS 51 38* 45  --   --   --   BILITOT 0.9 0.7 0.6  --   --   --   PROT 7.1 5.2* 5.6*  --   --   --   ALBUMIN 2.9* 2.2* 2.4* 2.3* 2.5* 2.9*   No results found for this basename: LIPASE, AMYLASE,  in the last 168 hours No results found for this basename: AMMONIA,  in the last 168 hours CBC:  Recent Labs Lab 06/08/12 0353 06/08/12 1200  06/09/12 0421  06/10/12 0330 06/10/12 1212 06/11/12 0448 06/12/12 0440  WBC 2.3* 4.1  --  3.3*  --  4.9  --  4.1 6.1  NEUTROABS  --  2.7  --   --   --   --   --   --   --   HGB 5.6* 6.7*  < > 7.0*  < > 8.7* 8.5* 8.5* 8.4*  HCT 16.3* 19.7*  < > 20.6*  < > 26.3* 25.0* 27.1* 26.7*  MCV 77.6* 78.8  --  80.5  --  83.8  --  87.1 87.8  PLT 32* 44*  --  40*  --  63*  --  99* 108*  < > = values in this interval not displayed. Cardiac Enzymes:  Recent Labs Lab 06/06/12 1132 06/06/12 1319 06/06/12 2241 06/07/12 0354 06/07/12 1041  TROPONINI <0.30 <0.30 <0.30 <0.30 <0.30   CBG:  Recent Labs Lab 06/11/12 1612 06/11/12 1923 06/12/12 0021 06/12/12 0352 06/12/12 0830  GLUCAP 160* 137* 126* 130* 113*    Iron Studies: No results found for this basename: IRON, TIBC, TRANSFERRIN, FERRITIN,  in the last 72 hours Studies/Results: Dg Chest Port 1 View  06/12/2012   *RADIOLOGY REPORT*  Clinical Data: 67 year old male shortness of breath, acute respiratory failure.  PORTABLE CHEST - 1 VIEW  Comparison: 06/11/2012 and earlier.  Findings: Semi upright AP portable view 0518 hours.  Extubated. Enteric tube removed.  Stable mildly improved lung volumes. Decreased retrocardiac opacity and improved visualization of the  left hemidiaphragm.  No pneumothorax.  Mildly increased interstitial markings.  Small right pleural effusion.  Stable left subclavian central line.  IMPRESSION: 1.  Extubated.  Improved retrocardiac ventilation. 2.  Mildly increased interstitial opacity might relate to increased vascular congestion.  Suspect small right effusion.   Original Report Authenticated By: Erskine Speed, M.D.   Dg Chest Port 1 View  06/11/2012   *RADIOLOGY REPORT*  Clinical Data: Evaluate lung fields, short of breath  PORTABLE CHEST - 1 VIEW  Comparison: Prior chest x-ray 06/10/2012  Findings: Stable position of endotracheal tube 3 cm above the carina.  Left subclavian approach central venous catheter with the tip in the mid superior vena cava.  The tip of the nasogastric tube lies below the diaphragm, likely within the stomach.  Slightly decreased interstitial edema.  Persistent pulmonary vascular congestion and cardiomegaly.  Low inspiratory volumes with left greater than right basilar opacities remain unchanged.  IMPRESSION:  1.  Slightly improved pulmonary vascular congestion 2.  Stable cardiomegaly and left greater than right basilar opacities likely reflecting atelectasis. 3.  Stable and satisfactory support apparatus   Original Report Authenticated By: Malachy Moan, M.D.   . antiseptic oral rinse  15 mL Mouth Rinse QID  .  ceFAZolin (ANCEF) IV  2 g  Intravenous Q8H  . doxycycline (VIBRAMYCIN) IV  100 mg Intravenous Q12H  . hydrALAZINE  50 mg Oral Q6H  . hydrocortisone sodium succinate  50 mg Intravenous Q12H  . insulin aspart  1-3 Units Subcutaneous Q4H  . magnesium sulfate LVP 250-500 ml  6 g Intravenous Once  . metoprolol tartrate  100 mg Oral BID  . pantoprazole  40 mg Oral Daily  . potassium chloride  40 mEq Oral Q4H    BMET    Component Value Date/Time   NA 148* 06/12/2012 0440   K 2.9* 06/12/2012 0440   CL 101 06/12/2012 0440   CO2 41* 06/12/2012 0440   GLUCOSE 123* 06/12/2012 0440   BUN 45* 06/12/2012 0440    CREATININE 1.23 06/12/2012 0440   CALCIUM 9.1 06/12/2012 0440   GFRNONAA 59* 06/12/2012 0440   GFRAA 68* 06/12/2012 0440   CBC    Component Value Date/Time   WBC 6.1 06/12/2012 0440   RBC 3.04* 06/12/2012 0440   HGB 8.4* 06/12/2012 0440   HCT 26.7* 06/12/2012 0440   PLT 108* 06/12/2012 0440   MCV 87.8 06/12/2012 0440   MCH 27.6 06/12/2012 0440   MCHC 31.5 06/12/2012 0440   RDW 14.9 06/12/2012 0440   LYMPHSABS 1.2 06/08/2012 1200   MONOABS 0.2 06/08/2012 1200   EOSABS 0.0 06/08/2012 1200   BASOSABS 0.0 06/08/2012 1200     Assessment/Plan:  1. AKI- non-oliguric. S/p one HD session on 06/07/12 mainly for pradaxa drug clearance. Scr has continued to improve since admission and pt has been undergoing daily plasmapheresis for presumed TTP. Cont with current care as he appears to be responding. His Scr has now normalized. 2. Hypernatremia- Improving.  Cont with hypotonic IVF's and free water. Cont to follow Sna levels but should get better now that he is taking po on his own.. 3. DM- will likely need more insulin given need for D5W 4. Hypokalemia- replete 5. HUS/TTP- Hematology following and coordinating plasmapheresis. No sig change in platelets, however does not appear to be bleeding this am.  Awaiting ADAMTS 13 6. ABLA- as above 7. SIRS- MRSE+blood cultures. On vanco. ID following. On vanco/zosyn. Source of infection unclear ?osteo 8. Diarrhea- C diff negative 9. VDRF- per PCCM 10. AMS- improving 11. A fib- off of pradaxa 12. Coagulopathy- as above 13. Metabolic alkalosis- stop lasix and hydrate with hypotonic saline. 14. Will sign off as nothing further to add. Call with questions/concerns  Aella Ronda A

## 2012-06-12 NOTE — Progress Notes (Signed)
[facility] ADULT ICU REPLACEMENT PROTOCOL FOR AM LAB REPLACEMENT ONLY  The patient does apply for the [facility] Adult ICU Electrolyte Replacment Protocol based on the criteria listed below:   1. Is GFR >/= 40 ml/min? yes  Patient's GFR today is 59 2. Is urine output >/= 0.5 ml/kg/hr for the last 6 hours? yes Patient's UOP is 1.4 ml/kg/hr 3. Is BUN < 60 mg/dL? yes  Patient's BUN today is 45 4. Abnormal electrolyte(s):K-2.9  Mg 1.5 5. Ordered repletion with: potassium and magnesium per protocol   Llana Aliment 06/12/2012 6:36 AM

## 2012-06-12 NOTE — Progress Notes (Signed)
Patient placed on his CPAP home unit.

## 2012-06-13 ENCOUNTER — Encounter (HOSPITAL_COMMUNITY): Payer: Self-pay

## 2012-06-13 DIAGNOSIS — K55039 Acute (reversible) ischemia of large intestine, extent unspecified: Secondary | ICD-10-CM | POA: Diagnosis present

## 2012-06-13 DIAGNOSIS — D62 Acute posthemorrhagic anemia: Secondary | ICD-10-CM | POA: Diagnosis present

## 2012-06-13 DIAGNOSIS — R7881 Bacteremia: Secondary | ICD-10-CM

## 2012-06-13 DIAGNOSIS — K55059 Acute (reversible) ischemia of intestine, part and extent unspecified: Secondary | ICD-10-CM

## 2012-06-13 DIAGNOSIS — B957 Other staphylococcus as the cause of diseases classified elsewhere: Secondary | ICD-10-CM

## 2012-06-13 DIAGNOSIS — I4821 Permanent atrial fibrillation: Secondary | ICD-10-CM | POA: Diagnosis present

## 2012-06-13 DIAGNOSIS — G4733 Obstructive sleep apnea (adult) (pediatric): Secondary | ICD-10-CM | POA: Diagnosis present

## 2012-06-13 DIAGNOSIS — I4819 Other persistent atrial fibrillation: Secondary | ICD-10-CM | POA: Diagnosis present

## 2012-06-13 HISTORY — DX: Bacteremia: R78.81

## 2012-06-13 HISTORY — DX: Other staphylococcus as the cause of diseases classified elsewhere: B95.7

## 2012-06-13 LAB — CBC
MCH: 27.4 pg (ref 26.0–34.0)
MCHC: 32 g/dL (ref 30.0–36.0)
RDW: 14.8 % (ref 11.5–15.5)

## 2012-06-13 LAB — GLUCOSE, CAPILLARY
Glucose-Capillary: 129 mg/dL — ABNORMAL HIGH (ref 70–99)
Glucose-Capillary: 102 mg/dL — ABNORMAL HIGH (ref 70–99)
Glucose-Capillary: 127 mg/dL — ABNORMAL HIGH (ref 70–99)

## 2012-06-13 LAB — BASIC METABOLIC PANEL
BUN: 39 mg/dL — ABNORMAL HIGH (ref 6–23)
Calcium: 8.6 mg/dL (ref 8.4–10.5)
Creatinine, Ser: 1.19 mg/dL (ref 0.50–1.35)
GFR calc Af Amer: 71 mL/min — ABNORMAL LOW (ref >90)

## 2012-06-13 LAB — PHOSPHORUS: Phosphorus: 3.2 mg/dL (ref 2.3–4.6)

## 2012-06-13 LAB — MAGNESIUM: Magnesium: 1.9 mg/dL (ref 1.5–2.5)

## 2012-06-13 MED ORDER — DRONEDARONE HCL 400 MG PO TABS
400.0000 mg | ORAL_TABLET | Freq: Two times a day (BID) | ORAL | Status: DC
  Administered 2012-06-13 – 2012-06-15 (×5): 400 mg via ORAL
  Filled 2012-06-13 (×8): qty 1

## 2012-06-13 MED ORDER — ALTEPLASE 100 MG IV SOLR
4.0000 mg | Freq: Once | INTRAVENOUS | Status: AC
  Administered 2012-06-13: 4 mg
  Filled 2012-06-13: qty 4

## 2012-06-13 MED ORDER — AMIODARONE IV BOLUS ONLY 150 MG/100ML
150.0000 mg | Freq: Once | INTRAVENOUS | Status: AC
  Administered 2012-06-13: 150 mg via INTRAVENOUS
  Filled 2012-06-13: qty 100

## 2012-06-13 MED ORDER — HYDROCORTISONE SOD SUCCINATE 100 MG IJ SOLR
25.0000 mg | Freq: Two times a day (BID) | INTRAMUSCULAR | Status: DC
  Administered 2012-06-13 – 2012-06-15 (×5): 25 mg via INTRAVENOUS
  Filled 2012-06-13 (×6): qty 0.5

## 2012-06-13 MED ORDER — METHYLPREDNISOLONE SODIUM SUCC 125 MG IJ SOLR
80.0000 mg | Freq: Once | INTRAMUSCULAR | Status: AC
  Administered 2012-06-13: 80 mg via INTRAVENOUS
  Filled 2012-06-13: qty 1.28

## 2012-06-13 MED ORDER — DIPHENHYDRAMINE HCL 25 MG PO CAPS: 25.0000 mg | ORAL_CAPSULE | Freq: Four times a day (QID) | ORAL | Status: DC | PRN

## 2012-06-13 MED ORDER — ACETAMINOPHEN 325 MG PO TABS: 650.0000 mg | ORAL_TABLET | ORAL | Status: DC | PRN

## 2012-06-13 MED ORDER — SODIUM CHLORIDE 0.9 % IV SOLN
4.0000 g | Freq: Once | INTRAVENOUS | Status: DC
  Filled 2012-06-13: qty 40

## 2012-06-13 MED ORDER — CALCIUM GLUCONATE 10 % IV SOLN
2.0000 g | Freq: Once | INTRAVENOUS | Status: DC
  Filled 2012-06-13: qty 20

## 2012-06-13 MED ORDER — ACD FORMULA A 0.73-2.45-2.2 GM/100ML VI SOLN
500.0000 mL | Status: DC
  Administered 2012-06-13: 500 mL via INTRAVENOUS
  Filled 2012-06-13 (×2): qty 500

## 2012-06-13 MED ORDER — METHYLPREDNISOLONE SODIUM SUCC 125 MG IJ SOLR
80.0000 mg | Freq: Once | INTRAMUSCULAR | Status: DC
  Filled 2012-06-13: qty 1.28

## 2012-06-13 MED ORDER — HYDRALAZINE HCL 25 MG PO TABS
25.0000 mg | ORAL_TABLET | Freq: Four times a day (QID) | ORAL | Status: DC
  Administered 2012-06-13 – 2012-06-19 (×23): 25 mg via ORAL
  Filled 2012-06-13 (×30): qty 1

## 2012-06-13 MED ORDER — ANTICOAGULANT SODIUM CITRATE 4% (200MG/5ML) IV SOLN
5.0000 mL | Freq: Once | Status: DC
  Filled 2012-06-13: qty 250

## 2012-06-13 MED ORDER — DRONEDARONE HCL 400 MG PO TABS
400.0000 mg | ORAL_TABLET | Freq: Two times a day (BID) | ORAL | Status: DC
  Filled 2012-06-13 (×2): qty 1

## 2012-06-13 MED ORDER — SODIUM CHLORIDE 0.9 % IV SOLN
4.0000 g | Freq: Once | INTRAVENOUS | Status: AC
  Administered 2012-06-13: 4 g via INTRAVENOUS
  Filled 2012-06-13 (×3): qty 40

## 2012-06-13 MED ORDER — SODIUM CHLORIDE 0.9 % IV SOLN: Freq: Once | INTRAVENOUS | Status: DC

## 2012-06-13 MED ORDER — CALCIUM CARBONATE ANTACID 500 MG PO CHEW
2.0000 | CHEWABLE_TABLET | ORAL | Status: AC
  Filled 2012-06-13 (×2): qty 2

## 2012-06-13 MED ORDER — ACD FORMULA A 0.73-2.45-2.2 GM/100ML VI SOLN
500.0000 mL | Status: DC
  Filled 2012-06-13: qty 500

## 2012-06-13 NOTE — Consult Note (Signed)
[facility] INPATIENT PROGRESS NOTE  Name: Brett [name]      MRN: [id number]    Location: 2112/2112-01  Date: 06/13/2012 Time:8:16 AM   Subjective: Interval History:Brett [name] was extubated 2 days ago.  He reported feeling well.  He denied fever, SOB, chest pain, bleeding, abd pain.    Objective: Vital signs in last 24 hours: Temp:  [97.8 F (36.6 C)-98.8 F (37.1 C)] 97.8 F (36.6 C) (06/06 0356) Pulse Rate:  [50-125] 59 (06/06 0800) Resp:  [0-33] 10 (06/06 0800) BP: (121-165)/(86-144) 160/106 mmHg (06/06 0800) SpO2:  [91 %-99 %] 98 % (06/06 0800) Weight:  [304 lb 3.8 oz (138 kg)] 304 lb 3.8 oz (138 kg) (06/06 0422)     PHYSICAL EXAM:  General: Obese man, no longer in rigour.  Eyes: no scleral icterus. ENT: clear oropharynx. There was no blood in his nose. Respiratory: lungs showed mild bibasilar crackles.  Cardiovascular: Regular rate and rhythm, S1/S2, without murmur, rub or gallop. There was no pedal edema. GI: abdomen was soft, flat, nontender, nondistended, without organomegaly.   Studies/Results: Results for orders placed during the hospital encounter of 06/06/12 (from the past 48 hour(s))  GLUCOSE, CAPILLARY     Status: Abnormal   Collection Time    06/11/12  8:21 AM      Result Value Range   Glucose-Capillary 189 (*) 70 - 99 mg/dL  THERAPEUTIC PLASMA EXCHANGE     Status: None   Collection Time    06/11/12 12:00 PM      Result Value Range   Plasma Exchange THAWED     Plasma volume needed 3900     Unit Number [id number]     Blood Component Type THAWED PLASMA     Unit division 00     Status of Unit ISSUED,FINAL     Transfusion Status OK TO TRANSFUSE     Unit Number [id number]     Blood Component Type THAWED PLASMA     Unit division 00     Status of Unit ISSUED,FINAL     Transfusion Status OK TO TRANSFUSE     Unit Number [id number]     Blood Component Type THAWED PLASMA     Unit division 00     Status of Unit ISSUED,FINAL      Transfusion Status OK TO TRANSFUSE     Unit Number [id number]     Blood Component Type THAWED PLASMA     Unit division 00     Status of Unit ISSUED,FINAL     Transfusion Status OK TO TRANSFUSE     Unit Number [id number]     Blood Component Type THAWED PLASMA     Unit division 00     Status of Unit ISSUED,FINAL     Transfusion Status OK TO TRANSFUSE     Unit Number [id number]     Blood Component Type THAWED PLASMA     Unit division 00     Status of Unit ISSUED,FINAL     Transfusion Status OK TO TRANSFUSE     Unit Number [id number]     Blood Component Type THAWED PLASMA     Unit division 00     Status of Unit ISSUED,FINAL     Transfusion Status OK TO TRANSFUSE     Unit Number [id number]     Blood Component Type THAWED PLASMA     Unit division 00     Status of Unit ISSUED,FINAL     Transfusion  Status OK TO TRANSFUSE     Unit Number [id number]     Blood Component Type THAWED PLASMA     Unit division 00     Status of Unit ISSUED,FINAL     Transfusion Status OK TO TRANSFUSE     Unit Number [id number]     Blood Component Type THAWED PLASMA     Unit division 00     Status of Unit ISSUED,FINAL     Transfusion Status OK TO TRANSFUSE     Unit Number [id number]     Blood Component Type THAWED PLASMA     Unit division 00     Status of Unit ISSUED,FINAL     Transfusion Status OK TO TRANSFUSE     Unit Number [id number]     Blood Component Type THAWED PLASMA     Unit division 00     Status of Unit ISSUED,FINAL     Transfusion Status OK TO TRANSFUSE    GLUCOSE, CAPILLARY     Status: Abnormal   Collection Time    06/11/12 12:26 PM      Result Value Range   Glucose-Capillary 181 (*) 70 - 99 mg/dL  BASIC METABOLIC PANEL     Status: Abnormal   Collection Time    06/11/12  1:00 PM      Result Value Range   Sodium 153 (*) 135 - 145 mEq/L   Potassium 3.6  3.5 - 5.1 mEq/L   Chloride 107  96 - 112 mEq/L   CO2 41 (*) 19 - 32 mEq/L   Comment: CRITICAL  RESULT CALLED TO, READ BACK BY AND VERIFIED WITH:     GOLDSTON J RN 06/11/12 1447 COSTELLO B   Glucose, Bld 197 (*) 70 - 99 mg/dL   BUN 58 (*) 6 - 23 mg/dL   Creatinine, Ser 1.60 (*) 0.50 - 1.35 mg/dL   Calcium 8.8  8.4 - 73.7 mg/dL   GFR calc non Af Amer 49 (*) >90 mL/min   GFR calc Af Amer 57 (*) >90 mL/min   Comment:            The eGFR has been calculated     using the CKD EPI equation.     This calculation has not been     validated in all clinical     situations.     eGFR's persistently     <90 mL/min signify     possible Chronic Kidney Disease.  GLUCOSE, CAPILLARY     Status: Abnormal   Collection Time    06/11/12  4:12 PM      Result Value Range   Glucose-Capillary 160 (*) 70 - 99 mg/dL   Comment 1 Notify RN    GLUCOSE, CAPILLARY     Status: Abnormal   Collection Time    06/11/12  7:23 PM      Result Value Range   Glucose-Capillary 137 (*) 70 - 99 mg/dL  GLUCOSE, CAPILLARY     Status: Abnormal   Collection Time    06/12/12 12:21 AM      Result Value Range   Glucose-Capillary 126 (*) 70 - 99 mg/dL   Comment 1 Documented in Chart     Comment 2 Notify RN    GLUCOSE, CAPILLARY     Status: Abnormal   Collection Time    06/12/12  3:52 AM      Result Value Range   Glucose-Capillary 130 (*) 70 - 99 mg/dL   Comment 1 Documented in  Chart     Comment 2 Notify RN    BASIC METABOLIC PANEL     Status: Abnormal   Collection Time    06/12/12  4:40 AM      Result Value Range   Sodium 148 (*) 135 - 145 mEq/L   Potassium 2.9 (*) 3.5 - 5.1 mEq/L   Comment: DELTA CHECK NOTED   Chloride 101  96 - 112 mEq/L   CO2 41 (*) 19 - 32 mEq/L   Comment: CRITICAL RESULT CALLED TO, READ BACK BY AND VERIFIED WITH:     C.HAYES RN 1610 06/12/12 E.GADDY   Glucose, Bld 123 (*) 70 - 99 mg/dL   BUN 45 (*) 6 - 23 mg/dL   Creatinine, Ser 9.60  0.50 - 1.35 mg/dL   Calcium 9.1  8.4 - 45.4 mg/dL   GFR calc non Af Amer 59 (*) >90 mL/min   GFR calc Af Amer 68 (*) >90 mL/min   Comment:            The  eGFR has been calculated     using the CKD EPI equation.     This calculation has not been     validated in all clinical     situations.     eGFR's persistently     <90 mL/min signify     possible Chronic Kidney Disease.  MAGNESIUM     Status: None   Collection Time    06/12/12  4:40 AM      Result Value Range   Magnesium 1.5  1.5 - 2.5 mg/dL  PHOSPHORUS     Status: None   Collection Time    06/12/12  4:40 AM      Result Value Range   Phosphorus 3.4  2.3 - 4.6 mg/dL  PROTIME-INR     Status: None   Collection Time    06/12/12  4:40 AM      Result Value Range   Prothrombin Time 14.7  11.6 - 15.2 seconds   INR 1.17  0.00 - 1.49  CALCIUM, IONIZED     Status: None   Collection Time    06/12/12  4:40 AM      Result Value Range   Calcium, Ion 1.14  1.13 - 1.30 mmol/L  CBC     Status: Abnormal   Collection Time    06/12/12  4:40 AM      Result Value Range   WBC 6.1  4.0 - 10.5 K/uL   RBC 3.04 (*) 4.22 - 5.81 MIL/uL   Hemoglobin 8.4 (*) 13.0 - 17.0 g/dL   HCT 09.8 (*) 11.9 - 14.7 %   MCV 87.8  78.0 - 100.0 fL   MCH 27.6  26.0 - 34.0 pg   MCHC 31.5  30.0 - 36.0 g/dL   RDW 82.9  56.2 - 13.0 %   Platelets 108 (*) 150 - 400 K/uL   Comment: CONSISTENT WITH PREVIOUS RESULT  THERAPEUTIC PLASMA EXCHANGE     Status: None   Collection Time    06/12/12  7:38 AM      Result Value Range   Plasma Exchange 3899 MLS THAWED     Plasma volume needed 3900     Unit Number [id number]     Blood Component Type THAWED PLASMA     Unit division 00     Status of Unit ALLOCATED     Transfusion Status OK TO TRANSFUSE     Unit Number [id number]  Blood Component Type THAWED PLASMA     Unit division 00     Status of Unit ALLOCATED     Transfusion Status OK TO TRANSFUSE     Unit Number [id number]     Blood Component Type THAWED PLASMA     Unit division 00     Status of Unit ALLOCATED     Transfusion Status OK TO TRANSFUSE     Unit Number [id number]     Blood Component Type  THAWED PLASMA     Unit division 00     Status of Unit ALLOCATED     Transfusion Status OK TO TRANSFUSE     Unit Number [id number]     Blood Component Type THAWED PLASMA     Unit division 00     Status of Unit ALLOCATED     Transfusion Status OK TO TRANSFUSE     Unit Number [id number]     Blood Component Type THAWED PLASMA     Unit division 00     Status of Unit ALLOCATED     Transfusion Status OK TO TRANSFUSE     Unit Number [id number]     Blood Component Type THAWED PLASMA     Unit division 00     Status of Unit ALLOCATED     Transfusion Status OK TO TRANSFUSE     Unit Number [id number]     Blood Component Type THAWED PLASMA     Unit division 00     Status of Unit ALLOCATED     Transfusion Status OK TO TRANSFUSE     Unit Number [id number]     Blood Component Type THAWED PLASMA     Unit division 00     Status of Unit ALLOCATED     Transfusion Status OK TO TRANSFUSE     Unit Number [id number]     Blood Component Type THAWED PLASMA     Unit division 00     Status of Unit ALLOCATED     Transfusion Status OK TO TRANSFUSE     Unit Number [id number]     Blood Component Type THAWED PLASMA     Unit division 00     Status of Unit ALLOCATED     Transfusion Status OK TO TRANSFUSE     Unit Number [id number]     Blood Component Type THAWED PLASMA     Unit division 00     Status of Unit ALLOCATED     Transfusion Status OK TO TRANSFUSE    GLUCOSE, CAPILLARY     Status: Abnormal   Collection Time    06/12/12  8:30 AM      Result Value Range   Glucose-Capillary 113 (*) 70 - 99 mg/dL  GLUCOSE, CAPILLARY     Status: Abnormal   Collection Time    06/12/12 12:15 PM      Result Value Range   Glucose-Capillary 138 (*) 70 - 99 mg/dL  GLUCOSE, CAPILLARY     Status: Abnormal   Collection Time    06/12/12  4:25 PM      Result Value Range   Glucose-Capillary 138 (*) 70 - 99 mg/dL  GLUCOSE, CAPILLARY     Status: Abnormal   Collection Time    06/12/12  7:50 PM       Result Value Range   Glucose-Capillary 162 (*) 70 - 99 mg/dL  BASIC METABOLIC PANEL     Status: Abnormal   Collection Time    06/12/12  8:00  PM      Result Value Range   Sodium 141  135 - 145 mEq/L   Potassium 3.6  3.5 - 5.1 mEq/L   Comment: DELTA CHECK NOTED   Chloride 99  96 - 112 mEq/L   CO2 35 (*) 19 - 32 mEq/L   Glucose, Bld 159 (*) 70 - 99 mg/dL   BUN 41 (*) 6 - 23 mg/dL   Creatinine, Ser 4.54  0.50 - 1.35 mg/dL   Calcium 8.6  8.4 - 09.8 mg/dL   GFR calc non Af Amer 61 (*) >90 mL/min   GFR calc Af Amer 71 (*) >90 mL/min   Comment:            The eGFR has been calculated     using the CKD EPI equation.     This calculation has not been     validated in all clinical     situations.     eGFR's persistently     <90 mL/min signify     possible Chronic Kidney Disease.  MAGNESIUM     Status: None   Collection Time    06/12/12  8:00 PM      Result Value Range   Magnesium 2.1  1.5 - 2.5 mg/dL  GLUCOSE, CAPILLARY     Status: Abnormal   Collection Time    06/13/12  1:04 AM      Result Value Range   Glucose-Capillary 116 (*) 70 - 99 mg/dL   Comment 1 Documented in Chart     Comment 2 Notify RN    GLUCOSE, CAPILLARY     Status: Abnormal   Collection Time    06/13/12  3:53 AM      Result Value Range   Glucose-Capillary 129 (*) 70 - 99 mg/dL   Comment 1 Documented in Chart     Comment 2 Notify RN    BASIC METABOLIC PANEL     Status: Abnormal   Collection Time    06/13/12  4:20 AM      Result Value Range   Sodium 142  135 - 145 mEq/L   Potassium 3.6  3.5 - 5.1 mEq/L   Chloride 99  96 - 112 mEq/L   CO2 36 (*) 19 - 32 mEq/L   Glucose, Bld 119 (*) 70 - 99 mg/dL   BUN 39 (*) 6 - 23 mg/dL   Creatinine, Ser 1.19  0.50 - 1.35 mg/dL   Calcium 8.6  8.4 - 14.7 mg/dL   GFR calc non Af Amer 61 (*) >90 mL/min   GFR calc Af Amer 71 (*) >90 mL/min   Comment:            The eGFR has been calculated     using the CKD EPI equation.     This calculation has not been      validated in all clinical     situations.     eGFR's persistently     <90 mL/min signify     possible Chronic Kidney Disease.  MAGNESIUM     Status: None   Collection Time    06/13/12  4:20 AM      Result Value Range   Magnesium 1.9  1.5 - 2.5 mg/dL  CBC     Status: Abnormal   Collection Time    06/13/12  4:20 AM      Result Value Range   WBC 6.7  4.0 - 10.5 K/uL   RBC 3.17 (*) 4.22 -  5.81 MIL/uL   Hemoglobin 8.7 (*) 13.0 - 17.0 g/dL   HCT 16.1 (*) 09.6 - 04.5 %   MCV 85.8  78.0 - 100.0 fL   MCH 27.4  26.0 - 34.0 pg   MCHC 32.0  30.0 - 36.0 g/dL   RDW 40.9  81.1 - 91.4 %   Platelets 117 (*) 150 - 400 K/uL   Comment: CONSISTENT WITH PREVIOUS RESULT  PHOSPHORUS     Status: None   Collection Time    06/13/12  4:20 AM      Result Value Range   Phosphorus 3.2  2.3 - 4.6 mg/dL   Dg Chest Port 1 View  06/12/2012   *RADIOLOGY REPORT*  Clinical Data: 67 year old male shortness of breath, acute respiratory failure.  PORTABLE CHEST - 1 VIEW  Comparison: 06/11/2012 and earlier.  Findings: Semi upright AP portable view 0518 hours.  Extubated. Enteric tube removed.  Stable mildly improved lung volumes. Decreased retrocardiac opacity and improved visualization of the left hemidiaphragm.  No pneumothorax.  Mildly increased interstitial markings.  Small right pleural effusion.  Stable left subclavian central line.  IMPRESSION: 1.  Extubated.  Improved retrocardiac ventilation. 2.  Mildly increased interstitial opacity might relate to increased vascular congestion.  Suspect small right effusion.   Original Report Authenticated By: Erskine Speed, M.D.     MEDICATIONS: reviewed.     PROBLEM LIST:  1. Afib; was on Pradaxa  2. Sepsis with blood culture x 2 Staph coag neg; and sputum with fungal species.  3. Acute renal failure from presumably sepsis; now resolved.  4. Fever:  Resolved.  5. Acute anemia:  improved 6. Thrombocytopenia:  improved.  7. Coagulopathy from either sepsis, Pradaxa  toxicity, or TTP.     IMPRESSION:   - ADAMTS13 was borderline low.  Normally, patients with TTP has much lower ADAMTS 13 level.  However, his level could have been spuriously higher than TTP level due to its drawing after FFP transfusion for bleeding diathesis.    RECOMMENDATIONS:   - Continue with empiric antibiotics.  - As his blood counts have improved, I recommended to switch phoresis to every other day including today Friday 06/13/12; then Mon/Wed/Friday of next week and probably off if his counts remain to be stable.  - Risk to watch out for transient worsening in anemia, hypocalcemia, rigor, transfusion-related lung injury.  - Continue to monitor CBC bid with transfusion for Hct <20; platelet <10 or active profuse bleeding.   During the weekend, please call On-Call Oncologist at [phone] if questions.  I will round on patient on Monday 06/16/12.

## 2012-06-13 NOTE — Plan of Care (Signed)
Followed up on persistent RVR. Heart rate still hanging in the 120s despite initial dose of patient's usual Multaq. We'll proceed with an amiodarone bolus of 150 mg to see if this can break the RVR. If high rates persist may need to stop oral amiodarone and proceed with an infusion and consider cardiology consultation.  Brett [name], ANP for Dr. Butler Denmark

## 2012-06-13 NOTE — Progress Notes (Signed)
[facility] Progress Note [place] TEAM 1 - Stepdown/ICU TEAM   Brett [name]:[id number] DOB: 03-10-1945 DOA: 06/06/2012 PCP: [name]., MD  Brief narrative: 67 year old male patient history of atrial fibrillation on Pradaxa prior to admission. Patient with abrupt onset of nonbloody diarrhea beginning 06/02/2012. Minimal urine output since 06/04/2012. His brother came to check on him 06/05/2012 and found the patient to be very weak deconditioned and disoriented and called EMS. Upon arrival to the emergency department patient was hypotensive with acute renal failure with apparent normal baseline creatinine in March of 2014 according to his family. Apparently the patient has continued to take her usual home medications including ACE inhibitor and Pradaxa He was also found to have thrombocytopenia with a platelet count of 51,000 the hemoglobin was normal. Because of his hypotension he was given 3 L of IV fluid but blood pressure remained in the 60s. He was empirically started on Zosyn and vancomycin to cover for possible sepsis etiology. Because of the hypotension he was started on pressors. He was subsequently admitted to the ICU.  Within 24 hours after admission the patient developed significant epistaxis, oral bleeding, bleeding from IV lines, hematuria, worsening thrombocytopenia, coagulopathy and acute blood loss anemia. He received 4 units of FFP, DDAVP, and FEIBA. Unfortunately he continued to bleed and his pancytopenia continue to worsen. Hematology was consulted. It was felt the patient may have acute TTP. He subsequently required hemodialysis acutely to remove the Pradaxa and daily plasmapheresis to treat the TTP.  Since admission patient has stabilized from a hemodynamic standpoint had and has been weaned from pressor worse. Because of acute severe encephalopathy he did require brief intubation until mental status improved. After admission 2 sets of blood cultures were  positive for coag-negative staph prompting infectious disease consultation. 2-D echocardiogram was completed and showed no evidence of vegetation. He was subsequently transferred out of the ICU to stepdown level and team 1 assumed care of this patient on 06/13/2012.  Assessment/Plan: Active Problems:  TTP (thrombotic thrombocytopenic purpura)/  Acute post-hemorrhagic anemia -per Dr. Gaylyn Rong -[id number] was borderline low but possibly inaccurate due to receipt of FFP,etc prior to this lab being obtained -cont to treat underlying cuases such as sepsis -transitioning to QOD plasmapheresis -rec. Transfusion for hct <20, platelets <10,000 or active profuse bleeding    Sepsis with metabolic encephalopathy/Septic shock -resolved -cont anbx's -suspect translocation of bacteria due to acute colitis -wean and dc stress dose steroids    Atrial fibrillation with RVR -Lopressor up titrated to home dose 6/5 -today with RVR/ rates 160's at times -will resume home Multaq but may require IV bolus amiodarone if RVR persists -not orthostatic    ARF (acute renal failure)/Metabolic acidosis -presumed due to [facility] in setting of hypotension, continued use of ACE I and thiazide diuretic -HD done to remove Pradaxa and renal fnx has returned baseline    Acute respiratory failure due to encephalopathy -resolved -sputum cx positive for mold- waiting on final cx before consider treat    Bacteremia due to coagulase-negative Staphylococcus -2 of 2 positive -ECHO negative -TEE recommended/considered by ID      Hyperglycemia -check HgbA1c    Diarrhea/? Acute ischemic colitis vs AGE -continues/bilious -pt recalls abrupt onset without blood, N/V or abdominal pain -too late for stool cx ??    ? OSA (obstructive sleep apnea) -body habitus c/w same -using nocturnal CPAP since admit    DVT prophylaxis: SCDs Code Status: Full Family Communication: Patient Disposition Plan: Stepdown Isolation: Contact/MRSA PCR  positive Nutritional Status: Mild acute malnutrition related to acute gastrointestinal illness/critical illness but tolerating solid diet now  Consultants: Nephrology-signed off Hematology  Procedures: L [place] TLC 5/30>>>  R a-line 5/30>>>6/4  ET tube 5/31>>>6/4  L femoral trialysis catheter 5/30>>>  CULTURES:  Blood 5/30>>>Coag neg staph  Urine 5/30>>>neg  Sputum 5/30>>>Mold  Antibiotics: Vanc 5/30>>>6/4  Zosyn 5/30>>> 6/2  Doxycycline 5/31>>>  Ancef 6/4>>>   HPI/Subjective: Alert. No recollection of immediate events preceding hospitalization but does recall having painless diarrhea for several days. Currently no shortness of breath or abdominal pain.   Objective: Blood pressure 137/90, pulse 83, temperature 97.7 F (36.5 C), temperature source Oral, resp. rate 27, height 5\' 11"  (1.803 m), weight 138 kg (304 lb 3.8 oz), SpO2 100.00%.  Intake/Output Summary (Last 24 hours) at 06/13/12 1101 Last data filed at 06/13/12 0900  Gross per 24 hour  Intake 1589.7 ml  Output   1050 ml  Net  539.7 ml    Exam: General: No acute respiratory distress Lungs: Clear to auscultation bilaterally without wheezes or crackles, RA Cardiovascular: Irregular rate and rhythm without murmur gallop or rub normal S1 and S2; 2+ bilateral peripheral edema with weeping of serous fluid below the knees, no JVD Abdomen: Nontender, nondistended, soft, bowel sounds positive, no rebound, no ascites, no appreciable mass, Flexiseal with loose bilious stool Musculoskeletal: No significant cyanosis, clubbing of bilateral lower extremities Neurological: Alert and oriented x 3, moves all extremities x 4 without focal neurological deficits, CN 2-12 intact  Scheduled Meds: Scheduled Meds: . antiseptic oral rinse  15 mL Mouth Rinse QID  .  ceFAZolin (ANCEF) IV  2 g Intravenous Q8H  . dronedarone  400 mg Oral BID WC  . hydrALAZINE  25 mg Oral Q6H  . hydrocortisone sodium succinate  25 mg Intravenous Q12H  .  methylPREDNISolone (SOLU-MEDROL) injection  80 mg Intravenous Once  . metoprolol tartrate  100 mg Oral BID  . pantoprazole  40 mg Oral Daily   Continuous Infusions: . sodium chloride 1,000 mL (06/08/12 2300)  . dextrose 20 mL/hr at 06/13/12 0848    Data Reviewed: Basic Metabolic Panel:  Recent Labs Lab 06/10/12 0330  06/10/12 1400 06/11/12 0448 06/11/12 1300 06/12/12 0440 06/12/12 2000 06/13/12 0420  NA 153*  < > 153* 156* 153* 148* 141 142  K 2.9*  < > 3.3* 3.6 3.6 2.9* 3.6 3.6  CL 103  < > 104 107 107 101 99 99  CO2 42*  --  41* 40* 41* 41* 35* 36*  GLUCOSE 172*  < > 226* 210* 197* 123* 159* 119*  BUN 75*  < > 66* 63* 58* 45* 41* 39*  CREATININE 2.20*  < > 1.84* 1.54* 1.44* 1.23 1.19 1.19  CALCIUM 9.3  --  9.5 9.3 8.8 9.1 8.6 8.6  MG 1.7  --   --  1.8  --  1.5 2.1 1.9  PHOS 3.0  --  3.0 2.6  --  3.4  --  3.2  < > = values in this interval not displayed. Liver Function Tests:  Recent Labs Lab 06/06/12 1122 06/06/12 1319 06/06/12 2233 06/07/12 0430 06/09/12 0421 06/10/12 1400  AST 178* 142* 144*  --   --   --   ALT 93* 71* 71*  --   --   --   ALKPHOS 51 38* 45  --   --   --   BILITOT 0.9 0.7 0.6  --   --   --   PROT  7.1 5.2* 5.6*  --   --   --   ALBUMIN 2.9* 2.2* 2.4* 2.3* 2.5* 2.9*   No results found for this basename: LIPASE, AMYLASE,  in the last 168 hours No results found for this basename: AMMONIA,  in the last 168 hours CBC:  Recent Labs Lab 06/08/12 0353 06/08/12 1200  06/09/12 0421  06/10/12 0330 06/10/12 1212 06/11/12 0448 06/12/12 0440 06/13/12 0420  WBC 2.3* 4.1  --  3.3*  --  4.9  --  4.1 6.1 6.7  NEUTROABS  --  2.7  --   --   --   --   --   --   --   --   HGB 5.6* 6.7*  < > 7.0*  < > 8.7* 8.5* 8.5* 8.4* 8.7*  HCT 16.3* 19.7*  < > 20.6*  < > 26.3* 25.0* 27.1* 26.7* 27.2*  MCV 77.6* 78.8  --  80.5  --  83.8  --  87.1 87.8 85.8  PLT 32* 44*  --  40*  --  63*  --  99* 108* 117*  < > = values in this interval not displayed. Cardiac  Enzymes:  Recent Labs Lab 06/06/12 1132 06/06/12 1319 06/06/12 2241 06/07/12 0354 06/07/12 1041  TROPONINI <0.30 <0.30 <0.30 <0.30 <0.30   BNP (last 3 results) No results found for this basename: PROBNP,  in the last 8760 hours CBG:  Recent Labs Lab 06/12/12 1625 06/12/12 1950 06/13/12 0104 06/13/12 0353 06/13/12 0800  GLUCAP 138* 162* 116* 129* 102*    Recent Results (from the past 240 hour(s))  CULTURE, BLOOD (ROUTINE X 2)     Status: None   Collection Time    06/06/12 11:30 AM      Result Value Range Status   Specimen Description BLOOD RIGHT ANTECUBITAL   Final   Special Requests BOTTLES DRAWN AEROBIC AND ANAEROBIC 10CC   Final   Culture  Setup Time 06/06/2012 18:25   Final   Culture     Final   Value: STAPHYLOCOCCUS SPECIES (COAGULASE NEGATIVE)     Note: SUSCEPTIBILITIES PERFORMED ON PREVIOUS CULTURE WITHIN THE LAST 5 DAYS.     Note: Gram Stain Report Called to,Read Back By and Verified With: SABO [facility] 06/07/12 @ 12:45PM BY RUSCA.   Report Status 06/10/2012 FINAL   Final  CULTURE, BLOOD (ROUTINE X 2)     Status: None   Collection Time    06/06/12 11:45 AM      Result Value Range Status   Specimen Description BLOOD RIGHT ANTECUBITAL   Final   Special Requests BOTTLES DRAWN AEROBIC ONLY 5CC   Final   Culture  Setup Time 06/06/2012 18:25   Final   Culture     Final   Value: STAPHYLOCOCCUS SPECIES (COAGULASE NEGATIVE)     Note: RIFAMPIN AND GENTAMICIN SHOULD NOT BE USED AS SINGLE DRUGS FOR TREATMENT OF STAPH INFECTIONS.     Note: Gram Stain Report Called to,Read Back By and Verified With: KATRICE KELLER 06/07/12 @ 5:38PM BY RUSCA.   Report Status 06/10/2012 FINAL   Final   Organism ID, Bacteria STAPHYLOCOCCUS SPECIES (COAGULASE NEGATIVE)   Final  URINE CULTURE     Status: None   Collection Time    06/06/12 11:59 AM      Result Value Range Status   Specimen Description URINE, CATHETERIZED   Final   Special Requests NONE   Final   Culture  Setup Time 06/06/2012  19:34   Final  Colony Count NO GROWTH   Final   Culture NO GROWTH   Final   Report Status 06/07/2012 FINAL   Final  MRSA PCR SCREENING     Status: Abnormal   Collection Time    06/06/12  2:01 PM      Result Value Range Status   MRSA by PCR POSITIVE (*) NEGATIVE Final   Comment:            The GeneXpert MRSA Assay (FDA     approved for NASAL specimens     only), is one component of a     comprehensive MRSA colonization     surveillance program. It is not     intended to diagnose MRSA     infection nor to guide or     monitor treatment for     MRSA infections.     RESULT CALLED TO, READ BACK BY AND VERIFIED WITH:     R. FOUNTAIN RN 16:45 06/06/12 ([email or web address])  URINE CULTURE     Status: None   Collection Time    06/06/12  4:59 PM      Result Value Range Status   Specimen Description URINE, CATHETERIZED   Final   Special Requests Normal   Final   Culture  Setup Time 06/06/2012 18:12   Final   Colony Count NO GROWTH   Final   Culture NO GROWTH   Final   Report Status 06/07/2012 FINAL   Final  CULTURE, RESPIRATORY (NON-EXPECTORATED)     Status: None   Collection Time    06/07/12 11:44 AM      Result Value Range Status   Specimen Description TRACHEAL ASPIRATE   Final   Special Requests NONE   Final   Gram Stain PENDING   Incomplete   Culture FEW Fungus isolated-identification to follow   Final   Report Status PENDING   Incomplete  STOOL CULTURE     Status: None   Collection Time    06/07/12  3:41 PM      Result Value Range Status   Specimen Description STOOL   Final   Special Requests Normal   Final   Culture     Final   Value: NO SALMONELLA, SHIGELLA, CAMPYLOBACTER, YERSINIA, OR E.COLI 0157:H7 ISOLATED     Note: REDUCED NORMAL FLORA PRESENT   Report Status 06/11/2012 FINAL   Final  CLOSTRIDIUM DIFFICILE BY PCR     Status: None   Collection Time    06/07/12  3:41 PM      Result Value Range Status   C difficile by pcr NEGATIVE  NEGATIVE Final  CULTURE, BLOOD (ROUTINE X 2)      Status: None   Collection Time    06/08/12  6:31 PM      Result Value Range Status   Specimen Description BLOOD LEFT ARM   Final   Special Requests BOTTLES DRAWN AEROBIC ONLY 6CC   Final   Culture  Setup Time 06/09/2012 02:06   Final   Culture     Final   Value:        BLOOD CULTURE RECEIVED NO GROWTH TO DATE CULTURE WILL BE HELD FOR 5 DAYS BEFORE ISSUING A FINAL NEGATIVE REPORT   Report Status PENDING   Incomplete     Studies:  Recent x-ray studies have been reviewed in detail by the Attending Physician  Scheduled Meds:  Reviewed in detail by the Attending Physician   Junious Silk, ANP [facility] Office  [phone]  Pager [phone]  **If unable to reach the above provider after paging please contact the Flow Manager @ [phone]  On-Call/Text Page:      [email or web address]      password [email or web address]  If 7PM-7AM, please contact night-coverage [email or web address] Password [email or web address] 06/13/2012, 11:01 AM   LOS: 7 days   I have examined the patient, reviewed the chart and modified the above note which I agree with.   Sariya Trickey,MD [phone] 06/13/2012, 2:49 PM

## 2012-06-13 NOTE — Progress Notes (Signed)
ANTIBIOTIC CONSULT NOTE - FOLLOW UP  Pharmacy Consult for Ancef Indication: MSSE bacteremia  No Known Allergies  Patient Measurements: Height: 5\' 11"  (180.3 cm) Weight: 304 lb 3.8 oz (138 kg) IBW/kg (Calculated) : 75.3 Adjusted Body Weight:   Vital Signs: Temp: 97.7 F (36.5 C) (06/06 0819) Temp src: Oral (06/06 0819) BP: 136/90 mmHg (06/06 1300) Pulse Rate: 123 (06/06 1300) Intake/Output from previous day: 06/05 0701 - 06/06 0700 In: 1889.7 [P.O.:240; I.V.:1649.7] Out: 1450 [Urine:1450] Intake/Output from this shift: Total I/O In: 240 [I.V.:240] Out: 300 [Urine:300]  Labs:  Recent Labs  06/11/12 0448  06/12/12 0440 06/12/12 2000 06/13/12 0420  WBC 4.1  --  6.1  --  6.7  HGB 8.5*  --  8.4*  --  8.7*  PLT 99*  --  108*  --  117*  CREATININE 1.54*  < > 1.23 1.19 1.19  < > = values in this interval not displayed. Estimated Creatinine Clearance: 85.5 ml/min (by C-G formula based on Cr of 1.19). No results found for this basename: VANCOTROUGH, Leodis Binet, VANCORANDOM, GENTTROUGH, GENTPEAK, GENTRANDOM, TOBRATROUGH, TOBRAPEAK, TOBRARND, AMIKACINPEAK, AMIKACINTROU, AMIKACIN,  in the last 72 hours   Microbiology: Recent Results (from the past 720 hour(s))  CULTURE, BLOOD (ROUTINE X 2)     Status: None   Collection Time    06/06/12 11:30 AM      Result Value Range Status   Specimen Description BLOOD RIGHT ANTECUBITAL   Final   Special Requests BOTTLES DRAWN AEROBIC AND ANAEROBIC 10CC   Final   Culture  Setup Time 06/06/2012 18:25   Final   Culture     Final   Value: STAPHYLOCOCCUS SPECIES (COAGULASE NEGATIVE)     Note: SUSCEPTIBILITIES PERFORMED ON PREVIOUS CULTURE WITHIN THE LAST 5 DAYS.     Note: Gram Stain Report Called to,Read Back By and Verified With: SABO [facility] 06/07/12 @ 12:45PM BY RUSCA.   Report Status 06/10/2012 FINAL   Final  CULTURE, BLOOD (ROUTINE X 2)     Status: None   Collection Time    06/06/12 11:45 AM      Result Value Range Status   Specimen  Description BLOOD RIGHT ANTECUBITAL   Final   Special Requests BOTTLES DRAWN AEROBIC ONLY 5CC   Final   Culture  Setup Time 06/06/2012 18:25   Final   Culture     Final   Value: STAPHYLOCOCCUS SPECIES (COAGULASE NEGATIVE)     Note: RIFAMPIN AND GENTAMICIN SHOULD NOT BE USED AS SINGLE DRUGS FOR TREATMENT OF STAPH INFECTIONS.     Note: Gram Stain Report Called to,Read Back By and Verified With: KATRICE KELLER 06/07/12 @ 5:38PM BY RUSCA.   Report Status 06/10/2012 FINAL   Final   Organism ID, Bacteria STAPHYLOCOCCUS SPECIES (COAGULASE NEGATIVE)   Final  URINE CULTURE     Status: None   Collection Time    06/06/12 11:59 AM      Result Value Range Status   Specimen Description URINE, CATHETERIZED   Final   Special Requests NONE   Final   Culture  Setup Time 06/06/2012 19:34   Final   Colony Count NO GROWTH   Final   Culture NO GROWTH   Final   Report Status 06/07/2012 FINAL   Final  MRSA PCR SCREENING     Status: Abnormal   Collection Time    06/06/12  2:01 PM      Result Value Range Status   MRSA by PCR POSITIVE (*) NEGATIVE Final  Comment:            The GeneXpert MRSA Assay (FDA     approved for NASAL specimens     only), is one component of a     comprehensive MRSA colonization     surveillance program. It is not     intended to diagnose MRSA     infection nor to guide or     monitor treatment for     MRSA infections.     RESULT CALLED TO, READ BACK BY AND VERIFIED WITH:     R. FOUNTAIN RN 16:45 06/06/12 ([email or web address])  URINE CULTURE     Status: None   Collection Time    06/06/12  4:59 PM      Result Value Range Status   Specimen Description URINE, CATHETERIZED   Final   Special Requests Normal   Final   Culture  Setup Time 06/06/2012 18:12   Final   Colony Count NO GROWTH   Final   Culture NO GROWTH   Final   Report Status 06/07/2012 FINAL   Final  CULTURE, RESPIRATORY (NON-EXPECTORATED)     Status: None   Collection Time    06/07/12 11:44 AM      Result Value Range Status    Specimen Description TRACHEAL ASPIRATE   Final   Special Requests NONE   Final   Gram Stain PENDING   Incomplete   Culture FEW Fungus isolated-identification to follow   Final   Report Status PENDING   Incomplete  STOOL CULTURE     Status: None   Collection Time    06/07/12  3:41 PM      Result Value Range Status   Specimen Description STOOL   Final   Special Requests Normal   Final   Culture     Final   Value: NO SALMONELLA, SHIGELLA, CAMPYLOBACTER, YERSINIA, OR E.COLI 0157:H7 ISOLATED     Note: REDUCED NORMAL FLORA PRESENT   Report Status 06/11/2012 FINAL   Final  CLOSTRIDIUM DIFFICILE BY PCR     Status: None   Collection Time    06/07/12  3:41 PM      Result Value Range Status   C difficile by pcr NEGATIVE  NEGATIVE Final  CULTURE, BLOOD (ROUTINE X 2)     Status: None   Collection Time    06/08/12  6:31 PM      Result Value Range Status   Specimen Description BLOOD LEFT ARM   Final   Special Requests BOTTLES DRAWN AEROBIC ONLY 6CC   Final   Culture  Setup Time 06/09/2012 02:06   Final   Culture     Final   Value:        BLOOD CULTURE RECEIVED NO GROWTH TO DATE CULTURE WILL BE HELD FOR 5 DAYS BEFORE ISSUING A FINAL NEGATIVE REPORT   Report Status PENDING   Incomplete    Anti-infectives   Start     Dose/Rate Route Frequency Ordered Stop   06/11/12 1500  ceFAZolin (ANCEF) IVPB 2 g/50 mL premix     2 g 100 mL/hr over 30 Minutes Intravenous 3 times per day 06/11/12 1437     06/10/12 1200  vancomycin (VANCOCIN) 1,750 mg in sodium chloride 0.9 % 500 mL IVPB  Status:  Discontinued     1,750 mg 250 mL/hr over 120 Minutes Intravenous Every 24 hours 06/10/12 0837 06/11/12 1350   06/09/12 1200  vancomycin (VANCOCIN) 1,750 mg in sodium chloride 0.9 % 500 mL  IVPB  Status:  Discontinued     1,750 mg 250 mL/hr over 120 Minutes Intravenous Every 48 hours 06/07/12 1152 06/10/12 0837   06/08/12 1200  vancomycin (VANCOCIN) 1,750 mg in sodium chloride 0.9 % 500 mL IVPB  Status:   Discontinued     1,750 mg 250 mL/hr over 120 Minutes Intravenous Every 48 hours 06/06/12 1347 06/07/12 1152   06/07/12 2200  doxycycline (VIBRAMYCIN) 100 mg in dextrose 5 % 250 mL IVPB  Status:  Discontinued     100 mg 125 mL/hr over 120 Minutes Intravenous Every 12 hours 06/07/12 2122 06/12/12 1341   06/07/12 1200  vancomycin (VANCOCIN) IVPB 1000 mg/200 mL premix     1,000 mg 200 mL/hr over 60 Minutes Intravenous  Once 06/07/12 1152 06/07/12 1329   06/06/12 1800  piperacillin-tazobactam (ZOSYN) IVPB 2.25 g  Status:  Discontinued     2.25 g 100 mL/hr over 30 Minutes Intravenous 4 times per day 06/06/12 1347 06/09/12 1011   06/06/12 1330  piperacillin-tazobactam (ZOSYN) IVPB 3.375 g  Status:  Discontinued     3.375 g 100 mL/hr over 30 Minutes Intravenous  Once 06/06/12 1320 06/06/12 1325   06/06/12 1330  vancomycin (VANCOCIN) IVPB 1000 mg/200 mL premix  Status:  Discontinued     1,000 mg 200 mL/hr over 60 Minutes Intravenous  Once 06/06/12 1320 06/06/12 1325   06/06/12 1200  piperacillin-tazobactam (ZOSYN) IVPB 3.375 g     3.375 g 12.5 mL/hr over 240 Minutes Intravenous  Once 06/06/12 1151 06/06/12 1600   06/06/12 1200  vancomycin (VANCOCIN) IVPB 1000 mg/200 mL premix  Status:  Discontinued     1,000 mg 200 mL/hr over 60 Minutes Intravenous  Once 06/06/12 1151 06/06/12 1158   06/06/12 1200  vancomycin (VANCOCIN) 2,000 mg in sodium chloride 0.9 % 500 mL IVPB     2,000 mg 250 mL/hr over 120 Minutes Intravenous STAT 06/06/12 1158 06/06/12 1429      Assessment: 66yom on Ancef Day 3 for MSSE bacteremia (patient was on Vancomycin 5/30 >>6/4). Patient is currently afebrile and WBC wnl. Repeat blood culture from 6/1 remains NGTD. ID narrowed therapy to Ancef and recommended to treat for a minimum of 14 days. SCr has continued to improve but UOP decreased overnight - monitor closely. - CrCl (normalized): ~62 ml/min  Plan:  1. Continue Ancef 2g IV q8h  2. Follow-up renal function, UOP,  clinical course and LOT  Cleon Dew [phone] 06/13/2012,2:35 PM

## 2012-06-13 NOTE — Progress Notes (Signed)
Physical Therapy Treatment Patient Details Name: KEAGEN HEINLEN MRN: [id number] DOB: 03/31/1945 Today's Date: 06/13/2012 Time: 1001-1026 PT Time Calculation (min): 25 min  PT Assessment / Plan / Recommendation Comments on Treatment Session  Patient able to perform ther ex supine prior to mobility as well as EOB ther ex today. Patient somewhat discouraged that he was unable to ambulate today secondary to HR complications.  Patient able to stand and take a few lateral steps prior to returning to bed and being positioned in chair setting.  Nsg aware of BPs and HRs. Will continue to see patient as indicated.    Follow Up Recommendations  Home health PT;Supervision - Intermittent (vs SNF pending patient progress)           Equipment Recommendations  None recommended by PT    Recommendations for Other Services    Frequency Min 3X/week   Plan Discharge plan remains appropriate    Precautions / Restrictions Restrictions Weight Bearing Restrictions: No   Pertinent Vitals/Pain HR at highest 180s Returning to supine; Fluctuated primarily in 140s 150s; BP 130s over 90s    Mobility  Bed Mobility Bed Mobility: Supine to Sit;Sitting - Scoot to Edge of Bed Supine to Sit: 4: Min assist Sitting - Scoot to [place] of Bed: 4: Min assist Sit to Supine: 3: Mod assist;HOB flat Details for Bed Mobility Assistance: Less assist required for bed mobility today; assist for trunk elevation Transfers Transfers: Sit to Stand;Stand to Sit;Stand Pivot Transfers Sit to Stand: 1: +2 Total assist Sit to Stand: Patient Percentage: 70% Stand to Sit: 1: +2 Total assist;With upper extremity assist Stand to Sit: Patient Percentage: 70% Stand Pivot Transfers: 1: +2 Total assist Stand Pivot Transfers: Patient Percentage: 70% Details for Transfer Assistance: VCs for hand placement; assist to elevate and control descent.  Ambulation/Gait Ambulation/Gait Assistance: 1: +2 Total assist Ambulation/Gait: Patient  Percentage: 70% Ambulation Distance (Feet): 3 Feet (lateral steps) Assistive device: 2 person hand held assist Ambulation/Gait Assistance Details: limited by HR (180s) General Gait Details: will assess further    Exercises General Exercises - Lower Extremity Ankle Circles/Pumps: AROM;Both;20 reps;Supine Long Arc Quad: AROM;Both;10 reps (seated EOB) Hip ABduction/ADduction: AROM;Both;10 reps;Supine Straight Leg Raises: AROM;Both;10 reps;Supine Hip Flexion/Marching: AROM;Both;10 reps (seated EOB)   PT Diagnosis:    PT Problem List:   PT Treatment Interventions:     PT Goals Acute Rehab PT Goals PT Goal Formulation: With patient Time For Goal Achievement: 06/26/12 Potential to Achieve Goals: Good Pt will go Supine/Side to Sit: with modified independence PT Goal: Supine/Side to Sit - Progress: Progressing toward goal Pt will go Sit to Stand: with modified independence PT Goal: Sit to Stand - Progress: Progressing toward goal Pt will Ambulate: >150 feet;with modified independence PT Goal: Ambulate - Progress: Not progressing (2/2 medical complications) Pt will Go Up / Down Stairs: 3-5 stairs;with min assist  Visit Information  Last PT Received On: 06/13/12 Assistance Needed: +2    Subjective Data  Subjective: I haven't been up and moving like i want to Patient Stated Goal: to go home   Cognition  Cognition Arousal/Alertness: Awake/alert Behavior During Therapy: WFL for tasks assessed/performed Overall Cognitive Status: Within Functional Limits for tasks assessed    Balance  Balance Balance Assessed: Yes Static Sitting Balance Static Sitting - Balance Support: Feet supported Static Sitting - Level of Assistance: 7: Independent Static Sitting - Comment/# of Minutes: sitting EOB for BP assessment and ther-ex  End of Session PT - End of Session Activity Tolerance:  Patient limited by fatigue Patient left: in chair;with call bell/phone within reach Nurse Communication:  Mobility status   GP     Fabio Asa 06/13/2012, 2:43 PM Charlotte Crumb, PT DPT  [phone]

## 2012-06-13 NOTE — Progress Notes (Signed)
NUTRITION FOLLOW UP  Intervention:   Continue Regular diet, no supplements needed at this time.  Nutrition Dx:   Inadequate oral intake related to inability to eat as evidenced by NPO status. Resolved.  New Goal:   Intake to meet >90% of estimated nutrition needs. Met.  Monitor:   PO intake, labs, weight trend.  Assessment:   Patient was extubated 6/4; receiving plasmapheresis every other day. Plans to transfer to SDU today. Discussed patient in ICU rounds today. Per RN, patient is eating 100% of meals.   Height: Ht Readings from Last 1 Encounters:  06/06/12 5\' 11"  (1.803 m)    Weight Status:   Wt Readings from Last 1 Encounters:  06/13/12 304 lb 3.8 oz (138 kg)  06/07/12  279 lb 5.2 oz (126.7 kg)  Weight up with positive fluid status.  Re-estimated needs:  Kcal: 2250-2450 Protein: 120-140 gm Fluid: 2.3-2.5 L  Skin: intact  Diet Order: General   Intake/Output Summary (Last 24 hours) at 06/13/12 1135 Last data filed at 06/13/12 1100  Gross per 24 hour  Intake 1689.7 ml  Output   1050 ml  Net  639.7 ml    Last BM: 6/5   Labs:   Recent Labs Lab 06/11/12 0448  06/12/12 0440 06/12/12 2000 06/13/12 0420  NA 156*  < > 148* 141 142  K 3.6  < > 2.9* 3.6 3.6  CL 107  < > 101 99 99  CO2 40*  < > 41* 35* 36*  BUN 63*  < > 45* 41* 39*  CREATININE 1.54*  < > 1.23 1.19 1.19  CALCIUM 9.3  < > 9.1 8.6 8.6  MG 1.8  --  1.5 2.1 1.9  PHOS 2.6  --  3.4  --  3.2  GLUCOSE 210*  < > 123* 159* 119*  < > = values in this interval not displayed.  CBG (last 3)   Recent Labs  06/13/12 0104 06/13/12 0353 06/13/12 0800  GLUCAP 116* 129* 102*    Scheduled Meds: . antiseptic oral rinse  15 mL Mouth Rinse QID  .  ceFAZolin (ANCEF) IV  2 g Intravenous Q8H  . dronedarone  400 mg Oral BID WC  . hydrALAZINE  25 mg Oral Q6H  . hydrocortisone sodium succinate  25 mg Intravenous Q12H  . methylPREDNISolone (SOLU-MEDROL) injection  80 mg Intravenous Once  . metoprolol  tartrate  100 mg Oral BID  . pantoprazole  40 mg Oral Daily    Continuous Infusions: . sodium chloride 1,000 mL (06/08/12 2300)  . dextrose 20 mL/hr at 06/13/12 0848    Joaquin Courts, RD, LDN, CNSC Pager [phone] After Hours Pager [phone]

## 2012-06-14 DIAGNOSIS — G9341 Metabolic encephalopathy: Secondary | ICD-10-CM

## 2012-06-14 DIAGNOSIS — B957 Other staphylococcus as the cause of diseases classified elsewhere: Secondary | ICD-10-CM

## 2012-06-14 LAB — CBC
Hemoglobin: 8.4 g/dL — ABNORMAL LOW (ref 13.0–17.0)
RBC: 2.99 MIL/uL — ABNORMAL LOW (ref 4.22–5.81)

## 2012-06-14 LAB — THERAPEUTIC PLASMA EXCHANGE (BLOOD BANK)
Plasma Exchange: 3899
Plasma volume needed: 3900
Unit division: 0
Unit division: 0
Unit division: 0
Unit division: 0

## 2012-06-14 LAB — COMPREHENSIVE METABOLIC PANEL
CO2: 36 mEq/L — ABNORMAL HIGH (ref 19–32)
Calcium: 8.7 mg/dL (ref 8.4–10.5)
Creatinine, Ser: 1.17 mg/dL (ref 0.50–1.35)
GFR calc Af Amer: 73 mL/min — ABNORMAL LOW (ref >90)
GFR calc non Af Amer: 63 mL/min — ABNORMAL LOW (ref >90)
Glucose, Bld: 150 mg/dL — ABNORMAL HIGH (ref 70–99)

## 2012-06-14 LAB — GLUCOSE, CAPILLARY: Glucose-Capillary: 156 mg/dL — ABNORMAL HIGH (ref 70–99)

## 2012-06-14 MED ORDER — POTASSIUM CHLORIDE CRYS ER 20 MEQ PO TBCR
40.0000 meq | EXTENDED_RELEASE_TABLET | Freq: Once | ORAL | Status: AC
  Administered 2012-06-14: 40 meq via ORAL
  Filled 2012-06-14: qty 2

## 2012-06-14 MED ORDER — DILTIAZEM HCL 100 MG IV SOLR
5.0000 mg/h | INTRAVENOUS | Status: AC
  Administered 2012-06-14: 5 mg/h via INTRAVENOUS
  Filled 2012-06-14 (×2): qty 100

## 2012-06-14 MED ORDER — PNEUMOCOCCAL VAC POLYVALENT 25 MCG/0.5ML IJ INJ: 0.5000 mL | INJECTION | INTRAMUSCULAR | Status: DC | PRN

## 2012-06-14 NOTE — Progress Notes (Deleted)
[facility] Progress Note [place] TEAM 1 - Stepdown/ICU TEAM   Brett [name]:[id number] DOB: August 15, 1945 DOA: 06/06/2012 PCP: [name]., MD  Brief narrative: 67 year old male patient history of atrial fibrillation on Pradaxa prior to admission. Patient with abrupt onset of nonbloody diarrhea beginning 06/02/2012. Minimal urine output since 06/04/2012. His brother came to check on him 06/05/2012 and found the patient to be very weak deconditioned and disoriented and called EMS. Upon arrival to the emergency department patient was hypotensive with acute renal failure with apparent normal baseline creatinine in March of 2014 according to his family. Apparently the patient has continued to take her usual home medications including ACE inhibitor and Pradaxa He was also found to have thrombocytopenia with a platelet count of 51,000 the hemoglobin was normal. Because of his hypotension he was given 3 L of IV fluid but blood pressure remained in the 60s. He was empirically started on Zosyn and vancomycin to cover for possible sepsis etiology. Because of the hypotension he was started on pressors. He was subsequently admitted to the ICU.  Within 24 hours after admission the patient developed significant epistaxis, oral bleeding, bleeding from IV lines, hematuria, worsening thrombocytopenia, coagulopathy and acute blood loss anemia. He received 4 units of FFP, DDAVP, and FEIBA. Unfortunately he continued to bleed and his pancytopenia continue to worsen. Hematology was consulted. It was felt the patient may have acute TTP. He subsequently required hemodialysis acutely to remove the Pradaxa and daily plasmapheresis to treat the TTP.  Since admission patient has stabilized from a hemodynamic standpoint had and has been weaned from pressor worse. Because of acute severe encephalopathy he did require brief intubation until mental status improved. After admission 2 sets of blood cultures were  positive for coag-negative staph prompting infectious disease consultation. 2-D echocardiogram was completed and showed no evidence of vegetation. He was subsequently transferred out of the ICU to stepdown level and team 1 assumed care of this patient on 06/13/2012.  Assessment/Plan: Active Problems:  TTP (thrombotic thrombocytopenic purpura)/  Acute post-hemorrhagic anemia -per Dr. Gaylyn Rong -[id number] was borderline low but possibly inaccurate due to receipt of FFP,etc prior to this lab being obtained -cont to treat underlying cuases such as sepsis -transitioning to QOD plasmapheresis -rec. Transfusion for hct <20, platelets <10,000 or active profuse bleeding    Sepsis with metabolic encephalopathy/Septic shock -resolved -cont anbx's -suspect translocation of bacteria due to acute colitis -wean and dc stress dose steroids today    Atrial fibrillation with RVR -Lopressor up titrated to home dose 6/5 -HR still in 120s- will ask for cardio eval -not orthostatic    ARF (acute renal failure)/Metabolic acidosis -presumed due to [facility] in setting of hypotension, continued use of ACE I and thiazide diuretic -HD done to remove Pradaxa and renal fnx has returned baseline    Acute respiratory failure due to encephalopathy -resolved -sputum cx positive for mold- waiting on final cx before consider treat    Bacteremia due to coagulase-negative Staphylococcus -2 of 2 positive -ECHO negative -TEE recommended/considered by ID      Hyperglycemia -check HgbA1c    Diarrhea/? Acute ischemic colitis vs AGE -continues/bilious -pt recalls abrupt onset without blood, N/V or abdominal pain -too late for stool cx ??    ? OSA (obstructive sleep apnea) -body habitus c/w same -using nocturnal CPAP since admit    DVT prophylaxis: SCDs Code Status: Full Family Communication: Patient Disposition Plan: Stepdown Isolation: Contact/MRSA PCR positive Nutritional Status: Mild acute malnutrition related to  acute  gastrointestinal illness/critical illness but tolerating solid diet now  Consultants: Nephrology-signed off Hematology  Procedures: L [place] TLC 5/30>>>  R a-line 5/30>>>6/4  ET tube 5/31>>>6/4  L femoral trialysis catheter 5/30>>>  CULTURES:  Blood 5/30>>>Coag neg staph  Urine 5/30>>>neg  Sputum 5/30>>>Mold  Antibiotics: Vanc 5/30>>>6/4  Zosyn 5/30>>> 6/2  Doxycycline 5/31>>>  Ancef 6/4>>>   HPI/Subjective: Alert. No complaints   Objective: Blood pressure 131/98, pulse 125, temperature 98 F (36.7 C), temperature source Oral, resp. rate 23, height 5\' 11"  (1.803 m), weight 138 kg (304 lb 3.8 oz), SpO2 97.00%.  Intake/Output Summary (Last 24 hours) at 06/14/12 1633 Last data filed at 06/14/12 1300  Gross per 24 hour  Intake    480 ml  Output   2475 ml  Net  -1995 ml    Exam: General: No acute respiratory distress Lungs: Clear to auscultation bilaterally without wheezes or crackles, RA Cardiovascular: Irregular rate and rhythm without murmur gallop or rub normal S1 and S2; 2+ bilateral peripheral edema with weeping of serous fluid below the knees, no JVD Abdomen: Nontender, nondistended, soft, bowel sounds positive, no rebound, no ascites, no appreciable mass, Flexiseal with loose bilious stool Musculoskeletal: No significant cyanosis, clubbing of bilateral lower extremities Neurological: Alert and oriented x 3, moves all extremities x 4 without focal neurological deficits, CN 2-12 intact  Scheduled Meds: Scheduled Meds: . anticoagulant sodium citrate  5 mL Intracatheter Once  .  ceFAZolin (ANCEF) IV  2 g Intravenous Q8H  . dronedarone  400 mg Oral BID WC  . hydrALAZINE  25 mg Oral Q6H  . hydrocortisone sodium succinate  25 mg Intravenous Q12H  . metoprolol tartrate  100 mg Oral BID  . pantoprazole  40 mg Oral Daily   Continuous Infusions: . sodium chloride 250 mL (06/14/12 0222)  . citrate dextrose    . dextrose 500 mL (06/14/12 0221)    Data  Reviewed: Basic Metabolic Panel:  Recent Labs Lab 06/10/12 0330  06/10/12 1400 06/11/12 0448 06/11/12 1300 06/12/12 0440 06/12/12 2000 06/13/12 0420 06/14/12 0400  NA 153*  < > 153* 156* 153* 148* 141 142 144  K 2.9*  < > 3.3* 3.6 3.6 2.9* 3.6 3.6 3.3*  CL 103  < > 104 107 107 101 99 99 100  CO2 42*  --  41* 40* 41* 41* 35* 36* 36*  GLUCOSE 172*  < > 226* 210* 197* 123* 159* 119* 150*  BUN 75*  < > 66* 63* 58* 45* 41* 39* 36*  CREATININE 2.20*  < > 1.84* 1.54* 1.44* 1.23 1.19 1.19 1.17  CALCIUM 9.3  --  9.5 9.3 8.8 9.1 8.6 8.6 8.7  MG 1.7  --   --  1.8  --  1.5 2.1 1.9  --   PHOS 3.0  --  3.0 2.6  --  3.4  --  3.2  --   < > = values in this interval not displayed. Liver Function Tests:  Recent Labs Lab 06/09/12 0421 06/10/12 1400 06/14/12 0400  AST  --   --  24  ALT  --   --  24  ALKPHOS  --   --  60  BILITOT  --   --  0.9  PROT  --   --  5.5*  ALBUMIN 2.5* 2.9* 2.9*   No results found for this basename: LIPASE, AMYLASE,  in the last 168 hours No results found for this basename: AMMONIA,  in the last 168 hours CBC:  Recent  Labs Lab 06/08/12 0353 06/08/12 1200  06/10/12 0330 06/10/12 1212 06/11/12 0448 06/12/12 0440 06/13/12 0420 06/14/12 0400  WBC 2.3* 4.1  < > 4.9  --  4.1 6.1 6.7 4.8  NEUTROABS  --  2.7  --   --   --   --   --   --   --   HGB 5.6* 6.7*  < > 8.7* 8.5* 8.5* 8.4* 8.7* 8.4*  HCT 16.3* 19.7*  < > 26.3* 25.0* 27.1* 26.7* 27.2* 25.5*  MCV 77.6* 78.8  < > 83.8  --  87.1 87.8 85.8 85.3  PLT 32* 44*  < > 63*  --  99* 108* 117* 115*  < > = values in this interval not displayed. Cardiac Enzymes: No results found for this basename: CKTOTAL, CKMB, CKMBINDEX, TROPONINI,  in the last 168 hours BNP (last 3 results) No results found for this basename: PROBNP,  in the last 8760 hours CBG:  Recent Labs Lab 06/13/12 0104 06/13/12 0353 06/13/12 0800 06/13/12 1145 06/14/12 0021  GLUCAP 116* 129* 102* 127* 156*    Recent Results (from the past 240  hour(s))  CULTURE, BLOOD (ROUTINE X 2)     Status: None   Collection Time    06/06/12 11:30 AM      Result Value Range Status   Specimen Description BLOOD RIGHT ANTECUBITAL   Final   Special Requests BOTTLES DRAWN AEROBIC AND ANAEROBIC 10CC   Final   Culture  Setup Time 06/06/2012 18:25   Final   Culture     Final   Value: STAPHYLOCOCCUS SPECIES (COAGULASE NEGATIVE)     Note: SUSCEPTIBILITIES PERFORMED ON PREVIOUS CULTURE WITHIN THE LAST 5 DAYS.     Note: Gram Stain Report Called to,Read Back By and Verified With: SABO [facility] 06/07/12 @ 12:45PM BY RUSCA.   Report Status 06/10/2012 FINAL   Final  CULTURE, BLOOD (ROUTINE X 2)     Status: None   Collection Time    06/06/12 11:45 AM      Result Value Range Status   Specimen Description BLOOD RIGHT ANTECUBITAL   Final   Special Requests BOTTLES DRAWN AEROBIC ONLY 5CC   Final   Culture  Setup Time 06/06/2012 18:25   Final   Culture     Final   Value: STAPHYLOCOCCUS SPECIES (COAGULASE NEGATIVE)     Note: RIFAMPIN AND GENTAMICIN SHOULD NOT BE USED AS SINGLE DRUGS FOR TREATMENT OF STAPH INFECTIONS.     Note: Gram Stain Report Called to,Read Back By and Verified With: KATRICE KELLER 06/07/12 @ 5:38PM BY RUSCA.   Report Status 06/10/2012 FINAL   Final   Organism ID, Bacteria STAPHYLOCOCCUS SPECIES (COAGULASE NEGATIVE)   Final  URINE CULTURE     Status: None   Collection Time    06/06/12 11:59 AM      Result Value Range Status   Specimen Description URINE, CATHETERIZED   Final   Special Requests NONE   Final   Culture  Setup Time 06/06/2012 19:34   Final   Colony Count NO GROWTH   Final   Culture NO GROWTH   Final   Report Status 06/07/2012 FINAL   Final  MRSA PCR SCREENING     Status: Abnormal   Collection Time    06/06/12  2:01 PM      Result Value Range Status   MRSA by PCR POSITIVE (*) NEGATIVE Final   Comment:            The GeneXpert  MRSA Assay (FDA     approved for NASAL specimens     only), is one component of a     comprehensive  MRSA colonization     surveillance program. It is not     intended to diagnose MRSA     infection nor to guide or     monitor treatment for     MRSA infections.     RESULT CALLED TO, READ BACK BY AND VERIFIED WITH:     R. FOUNTAIN RN 16:45 06/06/12 ([email or web address])  URINE CULTURE     Status: None   Collection Time    06/06/12  4:59 PM      Result Value Range Status   Specimen Description URINE, CATHETERIZED   Final   Special Requests Normal   Final   Culture  Setup Time 06/06/2012 18:12   Final   Colony Count NO GROWTH   Final   Culture NO GROWTH   Final   Report Status 06/07/2012 FINAL   Final  CULTURE, RESPIRATORY (NON-EXPECTORATED)     Status: None   Collection Time    06/07/12 11:44 AM      Result Value Range Status   Specimen Description TRACHEAL ASPIRATE   Final   Special Requests NONE   Final   Gram Stain PENDING   Incomplete   Culture FEW Fungus isolated-identification to follow   Final   Report Status PENDING   Incomplete  STOOL CULTURE     Status: None   Collection Time    06/07/12  3:41 PM      Result Value Range Status   Specimen Description STOOL   Final   Special Requests Normal   Final   Culture     Final   Value: NO SALMONELLA, SHIGELLA, CAMPYLOBACTER, YERSINIA, OR E.COLI 0157:H7 ISOLATED     Note: REDUCED NORMAL FLORA PRESENT   Report Status 06/11/2012 FINAL   Final  CLOSTRIDIUM DIFFICILE BY PCR     Status: None   Collection Time    06/07/12  3:41 PM      Result Value Range Status   C difficile by pcr NEGATIVE  NEGATIVE Final  CULTURE, BLOOD (ROUTINE X 2)     Status: None   Collection Time    06/08/12  6:31 PM      Result Value Range Status   Specimen Description BLOOD LEFT ARM   Final   Special Requests BOTTLES DRAWN AEROBIC ONLY 6CC   Final   Culture  Setup Time 06/09/2012 02:06   Final   Culture     Final   Value:        BLOOD CULTURE RECEIVED NO GROWTH TO DATE CULTURE WILL BE HELD FOR 5 DAYS BEFORE ISSUING A FINAL NEGATIVE REPORT   Report Status PENDING    Incomplete     Studies:  Recent x-ray studies have been reviewed in detail by the Attending Physician  Scheduled Meds:  Reviewed in detail by the Attending Physician   Calvert Cantor, MD [facility] Office  [phone] Pager [phone]  **If unable to reach the above provider after paging please contact the Flow Manager @ [phone]  On-Call/Text Page:      [email or web address]      password [email or web address]  If 7PM-7AM, please contact night-coverage [email or web address] Password [facility] 06/14/2012, 4:33 PM   LOS: 8 days

## 2012-06-14 NOTE — Progress Notes (Signed)
[facility] Progress Note [place] TEAM 1 - Stepdown/ICU TEAM   DEZMAN GRANDA UEA:[id number] DOB: May 12, 1945 DOA: 06/06/2012 PCP: [name]., MD  Brief narrative: 67 year old male patient history of atrial fibrillation on Pradaxa prior to admission. Patient with abrupt onset of nonbloody diarrhea beginning 06/02/2012. Minimal urine output since 06/04/2012. His brother came to check on him 06/05/2012 and found the patient to be very weak deconditioned and disoriented and called EMS. Upon arrival to the emergency department patient was hypotensive with acute renal failure with apparent normal baseline creatinine in March of 2014 according to his family. Apparently the patient has continued to take her usual home medications including ACE inhibitor and Pradaxa He was also found to have thrombocytopenia with a platelet count of 51,000 the hemoglobin was normal. Because of his hypotension he was given 3 L of IV fluid but blood pressure remained in the 60s. He was empirically started on Zosyn and vancomycin to cover for possible sepsis etiology. Because of the hypotension he was started on pressors. He was subsequently admitted to the ICU.  Within 24 hours after admission the patient developed significant epistaxis, oral bleeding, bleeding from IV lines, hematuria, worsening thrombocytopenia, coagulopathy and acute blood loss anemia. He received 4 units of FFP, DDAVP, and FEIBA. Unfortunately he continued to bleed and his pancytopenia continue to worsen. Hematology was consulted. It was felt the patient may have acute TTP. He subsequently required hemodialysis acutely to remove the Pradaxa and daily plasmapheresis to treat the TTP.  Since admission patient has stabilized from a hemodynamic standpoint had and has been weaned from pressor worse. Because of acute severe encephalopathy he did require brief intubation until mental status improved. After admission 2 sets of blood cultures were  positive for coag-negative staph prompting infectious disease consultation. 2-D echocardiogram was completed and showed no evidence of vegetation. He was subsequently transferred out of the ICU to stepdown level and team 1 assumed care of this patient on 06/13/2012.  Assessment/Plan: Active Problems:  TTP (thrombotic thrombocytopenic purpura)/  Acute post-hemorrhagic anemia -per Dr. Gaylyn Rong -[id number] was borderline low but possibly inaccurate due to receipt of FFP,etc prior to this lab being obtained -cont to treat underlying cuases such as sepsis -transitioning to QOD plasmapheresis -rec. Transfusion for hct <20, platelets <10,000 or active profuse bleeding    Sepsis with metabolic encephalopathy/Septic shock -resolved -cont anbx's -suspect translocation of bacteria due to acute colitis -wean and dc stress dose steroids today    Atrial fibrillation with RVR -Lopressor up titrated to home dose 6/5 -HR still in 120s- have discussed with cardiology- will start IV Cardizem -not dehydrated/orthostatic    ARF (acute renal failure)/Metabolic acidosis -presumed due to [facility] in setting of hypotension, continued use of ACE I and thiazide diuretic -HD done to remove Pradaxa and renal fnx has returned baseline    Acute respiratory failure due to encephalopathy -resolved -sputum cx positive for mold- waiting on final cx before consider treat    Bacteremia due to coagulase-negative Staphylococcus -2 of 2 positive -ECHO negative -TEE recommended/considered by ID      Hyperglycemia -check HgbA1c    Diarrhea/? Acute ischemic colitis vs AGE -continues/bilious -pt recalls abrupt onset without blood, N/V or abdominal pain -too late for stool cx ??    ? OSA (obstructive sleep apnea) -body habitus c/w same -using nocturnal CPAP since admit    DVT prophylaxis: SCDs Code Status: Full Family Communication: Patient Disposition Plan: Stepdown Isolation: Contact/MRSA PCR positive Nutritional  Status: Mild acute malnutrition  related to acute gastrointestinal illness/critical illness but tolerating solid diet now  Consultants: Nephrology-signed off Hematology  Procedures: L [place] TLC 5/30>>>  R a-line 5/30>>>6/4  ET tube 5/31>>>6/4  L femoral trialysis catheter 5/30>>>  CULTURES:  Blood 5/30>>>Coag neg staph  Urine 5/30>>>neg  Sputum 5/30>>>Mold  Antibiotics: Vanc 5/30>>>6/4  Zosyn 5/30>>> 6/2  Doxycycline 5/31>>>  Ancef 6/4>>>   HPI/Subjective: Alert. No complaints   Objective: Blood pressure 131/98, pulse 125, temperature 98 F (36.7 C), temperature source Oral, resp. rate 23, height 5\' 11"  (1.803 m), weight 138 kg (304 lb 3.8 oz), SpO2 97.00%.  Intake/Output Summary (Last 24 hours) at 06/14/12 1643 Last data filed at 06/14/12 1300  Gross per 24 hour  Intake    480 ml  Output   2475 ml  Net  -1995 ml    Exam: General: No acute respiratory distress Lungs: Clear to auscultation bilaterally without wheezes or crackles, RA Cardiovascular: Irregular rate and rhythm without murmur gallop or rub normal S1 and S2; 2+ bilateral peripheral edema with weeping of serous fluid below the knees, no JVD Abdomen: Nontender, nondistended, soft, bowel sounds positive, no rebound, no ascites, no appreciable mass, Flexiseal with loose bilious stool Musculoskeletal: No significant cyanosis, clubbing of bilateral lower extremities Neurological: Alert and oriented x 3, moves all extremities x 4 without focal neurological deficits, CN 2-12 intact  Scheduled Meds: Scheduled Meds: . anticoagulant sodium citrate  5 mL Intracatheter Once  .  ceFAZolin (ANCEF) IV  2 g Intravenous Q8H  . dronedarone  400 mg Oral BID WC  . hydrALAZINE  25 mg Oral Q6H  . hydrocortisone sodium succinate  25 mg Intravenous Q12H  . metoprolol tartrate  100 mg Oral BID  . pantoprazole  40 mg Oral Daily   Continuous Infusions: . sodium chloride 250 mL (06/14/12 0222)  . citrate dextrose    . dextrose  500 mL (06/14/12 0221)  . diltiazem (CARDIZEM) infusion      Data Reviewed: Basic Metabolic Panel:  Recent Labs Lab 06/10/12 0330  06/10/12 1400 06/11/12 0448 06/11/12 1300 06/12/12 0440 06/12/12 2000 06/13/12 0420 06/14/12 0400  NA 153*  < > 153* 156* 153* 148* 141 142 144  K 2.9*  < > 3.3* 3.6 3.6 2.9* 3.6 3.6 3.3*  CL 103  < > 104 107 107 101 99 99 100  CO2 42*  --  41* 40* 41* 41* 35* 36* 36*  GLUCOSE 172*  < > 226* 210* 197* 123* 159* 119* 150*  BUN 75*  < > 66* 63* 58* 45* 41* 39* 36*  CREATININE 2.20*  < > 1.84* 1.54* 1.44* 1.23 1.19 1.19 1.17  CALCIUM 9.3  --  9.5 9.3 8.8 9.1 8.6 8.6 8.7  MG 1.7  --   --  1.8  --  1.5 2.1 1.9  --   PHOS 3.0  --  3.0 2.6  --  3.4  --  3.2  --   < > = values in this interval not displayed. Liver Function Tests:  Recent Labs Lab 06/09/12 0421 06/10/12 1400 06/14/12 0400  AST  --   --  24  ALT  --   --  24  ALKPHOS  --   --  60  BILITOT  --   --  0.9  PROT  --   --  5.5*  ALBUMIN 2.5* 2.9* 2.9*   No results found for this basename: LIPASE, AMYLASE,  in the last 168 hours No results found for this basename:  AMMONIA,  in the last 168 hours CBC:  Recent Labs Lab 06/08/12 0353 06/08/12 1200  06/10/12 0330 06/10/12 1212 06/11/12 0448 06/12/12 0440 06/13/12 0420 06/14/12 0400  WBC 2.3* 4.1  < > 4.9  --  4.1 6.1 6.7 4.8  NEUTROABS  --  2.7  --   --   --   --   --   --   --   HGB 5.6* 6.7*  < > 8.7* 8.5* 8.5* 8.4* 8.7* 8.4*  HCT 16.3* 19.7*  < > 26.3* 25.0* 27.1* 26.7* 27.2* 25.5*  MCV 77.6* 78.8  < > 83.8  --  87.1 87.8 85.8 85.3  PLT 32* 44*  < > 63*  --  99* 108* 117* 115*  < > = values in this interval not displayed. Cardiac Enzymes: No results found for this basename: CKTOTAL, CKMB, CKMBINDEX, TROPONINI,  in the last 168 hours BNP (last 3 results) No results found for this basename: PROBNP,  in the last 8760 hours CBG:  Recent Labs Lab 06/13/12 0104 06/13/12 0353 06/13/12 0800 06/13/12 1145 06/14/12 0021   GLUCAP 116* 129* 102* 127* 156*    Recent Results (from the past 240 hour(s))  CULTURE, BLOOD (ROUTINE X 2)     Status: None   Collection Time    06/06/12 11:30 AM      Result Value Range Status   Specimen Description BLOOD RIGHT ANTECUBITAL   Final   Special Requests BOTTLES DRAWN AEROBIC AND ANAEROBIC 10CC   Final   Culture  Setup Time 06/06/2012 18:25   Final   Culture     Final   Value: STAPHYLOCOCCUS SPECIES (COAGULASE NEGATIVE)     Note: SUSCEPTIBILITIES PERFORMED ON PREVIOUS CULTURE WITHIN THE LAST 5 DAYS.     Note: Gram Stain Report Called to,Read Back By and Verified With: SABO [facility] 06/07/12 @ 12:45PM BY RUSCA.   Report Status 06/10/2012 FINAL   Final  CULTURE, BLOOD (ROUTINE X 2)     Status: None   Collection Time    06/06/12 11:45 AM      Result Value Range Status   Specimen Description BLOOD RIGHT ANTECUBITAL   Final   Special Requests BOTTLES DRAWN AEROBIC ONLY 5CC   Final   Culture  Setup Time 06/06/2012 18:25   Final   Culture     Final   Value: STAPHYLOCOCCUS SPECIES (COAGULASE NEGATIVE)     Note: RIFAMPIN AND GENTAMICIN SHOULD NOT BE USED AS SINGLE DRUGS FOR TREATMENT OF STAPH INFECTIONS.     Note: Gram Stain Report Called to,Read Back By and Verified With: KATRICE KELLER 06/07/12 @ 5:38PM BY RUSCA.   Report Status 06/10/2012 FINAL   Final   Organism ID, Bacteria STAPHYLOCOCCUS SPECIES (COAGULASE NEGATIVE)   Final  URINE CULTURE     Status: None   Collection Time    06/06/12 11:59 AM      Result Value Range Status   Specimen Description URINE, CATHETERIZED   Final   Special Requests NONE   Final   Culture  Setup Time 06/06/2012 19:34   Final   Colony Count NO GROWTH   Final   Culture NO GROWTH   Final   Report Status 06/07/2012 FINAL   Final  MRSA PCR SCREENING     Status: Abnormal   Collection Time    06/06/12  2:01 PM      Result Value Range Status   MRSA by PCR POSITIVE (*) NEGATIVE Final   Comment:  The GeneXpert MRSA Assay (FDA      approved for NASAL specimens     only), is one component of a     comprehensive MRSA colonization     surveillance program. It is not     intended to diagnose MRSA     infection nor to guide or     monitor treatment for     MRSA infections.     RESULT CALLED TO, READ BACK BY AND VERIFIED WITH:     R. FOUNTAIN RN 16:45 06/06/12 ([email or web address])  URINE CULTURE     Status: None   Collection Time    06/06/12  4:59 PM      Result Value Range Status   Specimen Description URINE, CATHETERIZED   Final   Special Requests Normal   Final   Culture  Setup Time 06/06/2012 18:12   Final   Colony Count NO GROWTH   Final   Culture NO GROWTH   Final   Report Status 06/07/2012 FINAL   Final  CULTURE, RESPIRATORY (NON-EXPECTORATED)     Status: None   Collection Time    06/07/12 11:44 AM      Result Value Range Status   Specimen Description TRACHEAL ASPIRATE   Final   Special Requests NONE   Final   Gram Stain PENDING   Incomplete   Culture FEW Fungus isolated-identification to follow   Final   Report Status PENDING   Incomplete  STOOL CULTURE     Status: None   Collection Time    06/07/12  3:41 PM      Result Value Range Status   Specimen Description STOOL   Final   Special Requests Normal   Final   Culture     Final   Value: NO SALMONELLA, SHIGELLA, CAMPYLOBACTER, YERSINIA, OR E.COLI 0157:H7 ISOLATED     Note: REDUCED NORMAL FLORA PRESENT   Report Status 06/11/2012 FINAL   Final  CLOSTRIDIUM DIFFICILE BY PCR     Status: None   Collection Time    06/07/12  3:41 PM      Result Value Range Status   C difficile by pcr NEGATIVE  NEGATIVE Final  CULTURE, BLOOD (ROUTINE X 2)     Status: None   Collection Time    06/08/12  6:31 PM      Result Value Range Status   Specimen Description BLOOD LEFT ARM   Final   Special Requests BOTTLES DRAWN AEROBIC ONLY 6CC   Final   Culture  Setup Time 06/09/2012 02:06   Final   Culture     Final   Value:        BLOOD CULTURE RECEIVED NO GROWTH TO DATE CULTURE WILL  BE HELD FOR 5 DAYS BEFORE ISSUING A FINAL NEGATIVE REPORT   Report Status PENDING   Incomplete     Studies:  Recent x-ray studies have been reviewed in detail by the Attending Physician  Scheduled Meds:  Reviewed in detail by the Attending Physician   Calvert Cantor, MD [facility] Office  [phone] Pager [phone]  **If unable to reach the above provider after paging please contact the Flow Manager @ [phone]  On-Call/Text Page:      [email or web address]      password [email or web address]  If 7PM-7AM, please contact night-coverage [email or web address] Password [facility] 06/14/2012, 4:43 PM   LOS: 8 days

## 2012-06-15 ENCOUNTER — Encounter (HOSPITAL_COMMUNITY): Payer: Self-pay | Admitting: Cardiology

## 2012-06-15 DIAGNOSIS — I4891 Unspecified atrial fibrillation: Secondary | ICD-10-CM

## 2012-06-15 DIAGNOSIS — G4733 Obstructive sleep apnea (adult) (pediatric): Secondary | ICD-10-CM

## 2012-06-15 LAB — CBC
HCT: 26.1 % — ABNORMAL LOW (ref 39.0–52.0)
MCV: 85.9 fL (ref 78.0–100.0)
RBC: 3.04 MIL/uL — ABNORMAL LOW (ref 4.22–5.81)
WBC: 5.6 10*3/uL (ref 4.0–10.5)

## 2012-06-15 LAB — CULTURE, BLOOD (ROUTINE X 2)

## 2012-06-15 LAB — RETICULOCYTES: Retic Count, Absolute: 176.3 10*3/uL (ref 19.0–186.0)

## 2012-06-15 LAB — COMPREHENSIVE METABOLIC PANEL
AST: 25 U/L (ref 0–37)
Albumin: 2.8 g/dL — ABNORMAL LOW (ref 3.5–5.2)
Alkaline Phosphatase: 61 U/L (ref 39–117)
Chloride: 101 mEq/L (ref 96–112)
Creatinine, Ser: 1.21 mg/dL (ref 0.50–1.35)
Potassium: 2.9 mEq/L — ABNORMAL LOW (ref 3.5–5.1)
Total Bilirubin: 0.7 mg/dL (ref 0.3–1.2)

## 2012-06-15 LAB — LACTATE DEHYDROGENASE: LDH: 249 U/L (ref 94–250)

## 2012-06-15 MED ORDER — DILTIAZEM HCL ER COATED BEADS 240 MG PO CP24
240.0000 mg | ORAL_CAPSULE | Freq: Every day | ORAL | Status: DC
  Administered 2012-06-15: 240 mg via ORAL
  Filled 2012-06-15 (×2): qty 1

## 2012-06-15 MED ORDER — POTASSIUM CHLORIDE CRYS ER 20 MEQ PO TBCR
40.0000 meq | EXTENDED_RELEASE_TABLET | Freq: Once | ORAL | Status: AC
  Administered 2012-06-15: 40 meq via ORAL
  Filled 2012-06-15: qty 2

## 2012-06-15 NOTE — Consult Note (Signed)
Reason for Consult: Atrial Fibrillation w/ RVR Referring Physician: TRH  HPI: the patient is a 67 y/o male, followed at [facility] by Dr. Herbie Baltimore. His history is significant for HTN, obesity, OSA and recurrent PAF, treated with Multaq and Metoprolol succinate. He has been on Pradaxa for oral anticoagulation. His last office visit with Dr. Herbie Baltimore was 02/11/12, and at that time he was doing well from a cardiovascular standpoint. He was admitted to [facility] on 06/06/12 for sepsis. Apparently, within 24 hours after admission to the ICU, he developed significant epistaxis, oral bleeding, bleeding from IV lines, hematuria, worsening thrombocytopenia, coagulopathy and acute blood loss anemia. Hematology was consulted, and it was felt that he had acute TTP. This has led to required hemodialysis to remove the Pradaxa and daily plasmapheresis to treat the TTP. We are now seeing the patient in consult for atrial fibrillation with RVR. Apparently yesterday, he had ventricular rates in the 160s. He was continued on his home dose of Multaq and his BB was increased. IV Cardizem was also initiated for rate control. The patient now states that he is comfortable and is always unaware of his a-fib. He denies palpitations,chest discomfort, lightheadedness and dizziness. He reported being slightly short of breath and fatigued yesterday. He has no complaints currently. He denies further bleeding.    Past Medical History  Diagnosis Date  . Persistent atrial fibrillation 02/2011    Negative Myoview; Normal EF by Echo  . Obesity (BMI 30-39.9)   . Hypertension   . Dyslipidemia     Past Surgical History  Procedure Laterality Date  . Cardioversion  06/08/2011    Procedure: CARDIOVERSION;  Surgeon: Marykay Lex, MD;  Location: [facility] OR;  Service: Cardiovascular;  Laterality: N/A;  . Joint replacement      Family History  Problem Relation Age of Onset  . Heart failure Mother 83     Social History:  reports that he has quit smoking.  His smoking use included Cigars. He does not have any smokeless tobacco history on file. He reports that he does not drink alcohol or use illicit drugs.  Allergies: No Known Allergies  Prior to Admission medications   Medication Sig Start Date End Date Taking? Authorizing Provider  aspirin EC 81 MG tablet Take 81 mg by mouth daily.   Yes Historical Provider, MD  dabigatran (PRADAXA) 150 MG CAPS Take 150 mg by mouth every 12 (twelve) hours.   Yes Historical Provider, MD  dronedarone (MULTAQ) 400 MG tablet Take 400 mg by mouth 2 (two) times daily with a meal.   Yes Historical Provider, MD  fexofenadine (ALLEGRA) 180 MG tablet Take 180 mg by mouth daily.   Yes Historical Provider, MD  lisinopril-hydrochlorothiazide (PRINZIDE,ZESTORETIC) 20-25 MG per tablet Take 0.5 tablets by mouth daily.   Yes Historical Provider, MD  metoprolol succinate (TOPROL-XL) 100 MG 24 hr tablet Take 100 mg by mouth daily. Take with or immediately following a meal.   Yes Historical Provider, MD  Multiple Vitamin (MULITIVITAMIN WITH MINERALS) TABS Take 1 tablet by mouth daily.   Yes Historical Provider, MD  niacin (NIASPAN) 500 MG CR tablet Take 500 mg by mouth at bedtime.   Yes Historical Provider, MD     Results for orders placed during the hospital encounter of 06/06/12 (from the past 48 hour(s))  GLUCOSE, CAPILLARY     Status: Abnormal   Collection Time    06/13/12 11:45 AM      Result Value Range   Glucose-Capillary 127 (*) 70 -  99 mg/dL   Comment 1 Documented in Chart     Comment 2 Notify RN    GLUCOSE, CAPILLARY     Status: Abnormal   Collection Time    06/14/12 12:21 AM      Result Value Range   Glucose-Capillary 156 (*) 70 - 99 mg/dL   Comment 1 Documented in Chart     Comment 2 Notify RN    COMPREHENSIVE METABOLIC PANEL     Status: Abnormal   Collection Time    06/14/12  4:00 AM      Result Value Range   Sodium 144  135 - 145 mEq/L   Potassium 3.3 (*) 3.5 - 5.1 mEq/L   Chloride 100  96 - 112 mEq/L    CO2 36 (*) 19 - 32 mEq/L   Glucose, Bld 150 (*) 70 - 99 mg/dL   BUN 36 (*) 6 - 23 mg/dL   Creatinine, Ser 2.13  0.50 - 1.35 mg/dL   Calcium 8.7  8.4 - 08.6 mg/dL   Total Protein 5.5 (*) 6.0 - 8.3 g/dL   Albumin 2.9 (*) 3.5 - 5.2 g/dL   AST 24  0 - 37 U/L   ALT 24  0 - 53 U/L   Alkaline Phosphatase 60  39 - 117 U/L   Total Bilirubin 0.9  0.3 - 1.2 mg/dL   GFR calc non Af Amer 63 (*) >90 mL/min   GFR calc Af Amer 73 (*) >90 mL/min   Comment:            The eGFR has been calculated     using the CKD EPI equation.     This calculation has not been     validated in all clinical     situations.     eGFR's persistently     <90 mL/min signify     possible Chronic Kidney Disease.  CBC     Status: Abnormal   Collection Time    06/14/12  4:00 AM      Result Value Range   WBC 4.8  4.0 - 10.5 K/uL   RBC 2.99 (*) 4.22 - 5.81 MIL/uL   Hemoglobin 8.4 (*) 13.0 - 17.0 g/dL   HCT 57.8 (*) 46.9 - 62.9 %   MCV 85.3  78.0 - 100.0 fL   MCH 28.1  26.0 - 34.0 pg   MCHC 32.9  30.0 - 36.0 g/dL   RDW 52.8  41.3 - 24.4 %   Platelets 115 (*) 150 - 400 K/uL   Comment: CONSISTENT WITH PREVIOUS RESULT  CBC     Status: Abnormal   Collection Time    06/15/12  8:55 AM      Result Value Range   WBC 5.6  4.0 - 10.5 K/uL   RBC 3.04 (*) 4.22 - 5.81 MIL/uL   Hemoglobin 8.5 (*) 13.0 - 17.0 g/dL   HCT 01.0 (*) 27.2 - 53.6 %   MCV 85.9  78.0 - 100.0 fL   MCH 28.0  26.0 - 34.0 pg   MCHC 32.6  30.0 - 36.0 g/dL   RDW 64.4 (*) 03.4 - 74.2 %   Platelets 126 (*) 150 - 400 K/uL  RETICULOCYTES     Status: Abnormal   Collection Time    06/15/12  8:55 AM      Result Value Range   Retic Ct Pct 5.8 (*) 0.4 - 3.1 %   RBC. 3.04 (*) 4.22 - 5.81 MIL/uL   Retic Count, Manual  176.3  19.0 - 186.0 K/uL  LACTATE DEHYDROGENASE     Status: None   Collection Time    06/15/12  8:55 AM      Result Value Range   LDH 249  94 - 250 U/L  COMPREHENSIVE METABOLIC PANEL     Status: Abnormal   Collection Time    06/15/12  8:55  AM      Result Value Range   Sodium 142  135 - 145 mEq/L   Potassium 2.9 (*) 3.5 - 5.1 mEq/L   Chloride 101  96 - 112 mEq/L   CO2 34 (*) 19 - 32 mEq/L   Glucose, Bld 105 (*) 70 - 99 mg/dL   BUN 33 (*) 6 - 23 mg/dL   Creatinine, Ser 1.61  0.50 - 1.35 mg/dL   Calcium 8.1 (*) 8.4 - 10.5 mg/dL   Total Protein 5.6 (*) 6.0 - 8.3 g/dL   Albumin 2.8 (*) 3.5 - 5.2 g/dL   AST 25  0 - 37 U/L   ALT 18  0 - 53 U/L   Alkaline Phosphatase 61  39 - 117 U/L   Total Bilirubin 0.7  0.3 - 1.2 mg/dL   GFR calc non Af Amer 60 (*) >90 mL/min   GFR calc Af Amer 70 (*) >90 mL/min   Comment:            The eGFR has been calculated     using the CKD EPI equation.     This calculation has not been     validated in all clinical     situations.     eGFR's persistently     <90 mL/min signify     possible Chronic Kidney Disease.    No results found.  Review of Systems  Constitutional: Positive for malaise/fatigue. Negative for fever, chills and diaphoresis.  HENT: Negative for ear pain, congestion and sore throat.   Eyes: Negative for blurred vision and double vision.  Respiratory: Positive for shortness of breath (mild). Negative for cough, hemoptysis and wheezing.   Cardiovascular: Negative for chest pain, palpitations, orthopnea, claudication, leg swelling and PND.  Gastrointestinal: Positive for diarrhea (1 week ago (now resolved)). Negative for nausea, vomiting, abdominal pain, blood in stool and melena.  Genitourinary: Positive for hematuria (now resolved). Negative for dysuria and urgency.  Musculoskeletal: Negative for myalgias and falls.  Skin: Negative for itching and rash.  Neurological: Negative for dizziness, focal weakness, seizures, loss of consciousness, weakness and headaches.   Blood pressure 149/90, pulse 95, temperature 97.4 F (36.3 C), temperature source Oral, resp. rate 26, height 5\' 11"  (1.803 m), weight 297 lb 9.9 oz (135 kg), SpO2 98.00%. Physical Exam  Constitutional: He is  oriented to person, place, and time. He appears well-developed and well-nourished. No distress.  Obese   HENT:  Head: Normocephalic and atraumatic.  Eyes: Conjunctivae and EOM are normal. Pupils are equal, round, and reactive to light.  Neck: Normal range of motion. Neck supple. No JVD present. Carotid bruit is not present. No tracheal deviation present.  Cardiovascular: An irregularly irregular rhythm present. Exam reveals no gallop and no friction rub.   No murmur heard. Pulses:      Radial pulses are 2+ on the right side, and 2+ on the left side.       Dorsalis pedis pulses are 2+ on the right side, and 2+ on the left side.  Respiratory: Effort normal and breath sounds normal. No respiratory distress. He has no wheezes. He  has no rales.  GI: Soft. Bowel sounds are normal. He exhibits no distension and no mass. There is no tenderness.  Musculoskeletal: He exhibits edema (trace bilateral).  Lymphadenopathy:    He has no cervical adenopathy.  Neurological: He is alert and oriented to person, place, and time.  Skin: Skin is warm and dry. He is not diaphoretic.  Psychiatric: He has a normal mood and affect. His behavior is normal.    Assessment/Plan: Principal Problem:   TTP (thrombotic thrombocytopenic purpura) Active Problems:   Sepsis with metabolic encephalopathy   Septic shock   ARF (acute renal failure)   Metabolic acidosis   Hyperglycemia   Diarrhea   Acute respiratory failure due to encpehalopathy   Atrial fibrillation with RVR   Bacteremia due to coagulase-negative Staphylococcus   ? Acute ischemic colitis   ? OSA (obstructive sleep apnea)   Acute post-hemorrhagic anemia   Plan: Pt continues in a-fib. Rates are better controlled after initiation of IV Diltiazem. HRs are currently in the upper 90s-low 100s. He is asymptomatic. BP is stable. He is currently on 10 mg/hr of IV Dilt. If adequate rate control is not maintained and patient has recurrence of RVR with higher  rates, may need to consider discontinuing Multaq and starting on Amiodarone. The patient denies further bleeding. Will continue to follow.   Allayne Butcher, PA-C 06/15/2012, 10:27 AM   I have seen and examined the patient along with BRITTAINY M. SIMMONS, PA-C.  I have reviewed the chart, notes and new data.  I agree with PA's note.  Key new complaints: feels well, unaware of arrhythmia, no dyspnea while supine Key examination changes: no active bleeding, irregular rhythm, severely obese Key new findings / data: Hgb and plt slowly normalizing; K severely low  PLAN: Replace K Stop Multaq Switch diltiazem to PO No plan for cardioversion until acute illness resolves, electrolytes are normal and we can guarantee uninterrupted anticoagulation for a minimum of 30 days  Thurmon Fair, MD, [facility] [phone] 06/15/2012, 10:55 AM

## 2012-06-15 NOTE — Progress Notes (Signed)
[facility] Progress Note [place] TEAM 1 - Stepdown/ICU TEAM   GEDALIA MCMILLON WUJ:[id number] DOB: 27-Jun-1945 DOA: 06/06/2012 PCP: [name]., MD  Brief narrative: 67 year old male patient history of atrial fibrillation on Pradaxa prior to admission. Patient with abrupt onset of nonbloody diarrhea beginning 06/02/2012. Minimal urine output since 06/04/2012. His brother came to check on him 06/05/2012 and found the patient to be very weak deconditioned and disoriented and called EMS. Upon arrival to the emergency department patient was hypotensive with acute renal failure with apparent normal baseline creatinine in March of 2014 according to his family. Apparently the patient has continued to take her usual home medications including ACE inhibitor and Pradaxa He was also found to have thrombocytopenia with a platelet count of 51,000 the hemoglobin was normal. Because of his hypotension he was given 3 L of IV fluid but blood pressure remained in the 60s. He was empirically started on Zosyn and vancomycin to cover for possible sepsis etiology. Because of the hypotension he was started on pressors. He was subsequently admitted to the ICU.  Within 24 hours after admission the patient developed significant epistaxis, oral bleeding, bleeding from IV lines, hematuria, worsening thrombocytopenia, coagulopathy and acute blood loss anemia. He received 4 units of FFP, DDAVP, and FEIBA. Unfortunately he continued to bleed and his pancytopenia continue to worsen. Hematology was consulted. It was felt the patient may have acute TTP. He subsequently required hemodialysis acutely to remove the Pradaxa and daily plasmapheresis to treat the TTP.  Since admission patient has stabilized from a hemodynamic standpoint had and has been weaned from pressor worse. Because of acute severe encephalopathy he did require brief intubation until mental status improved. After admission 2 sets of blood cultures were  positive for coag-negative staph prompting infectious disease consultation. 2-D echocardiogram was completed and showed no evidence of vegetation. He was subsequently transferred out of the ICU to stepdown level and team 1 assumed care of this patient on 06/13/2012.  Assessment/Plan: Active Problems:  TTP (thrombotic thrombocytopenic purpura)/  Acute post-hemorrhagic anemia -per Dr. Gaylyn Rong- will be plasmapheresed again tomorrow -[id number] was borderline low but possibly inaccurate due to receipt of FFP,etc prior to this lab being obtained -cont to treat underlying cuases such as sepsis    Sepsis with metabolic encephalopathy/Septic shock -resolved -wean and dc stress dose steroids today    Atrial fibrillation with RVR -Lopressor up titrated to home dose 6/5 -HR still in 120s- - cardiology has transitioned him off of Multaq and onto Cardizem -will need to resume Pradaxa when able  Hypokalemia Replace and recheck in AM- has no further diarrhea and is eating well- not sure of the cause for hypokalemia    ARF (acute renal failure)/Metabolic acidosis -presumed due to [facility] in setting of hypotension, continued use of ACE I and thiazide diuretic -HD done to remove Pradaxa and renal fnx has returned baseline    Acute respiratory failure due to encephalopathy -resolved    Bacteremia due to coagulase-negative Staphylococcus -2 of 2 positive -ECHO negative -TEE recommended/considered by ID      Hyperglycemia -check HgbA1c    Diarrhea/? Acute ischemic colitis vs AGE -continues/bilious -pt recalls abrupt onset without blood, N/V or abdominal pain - now resolved    ? OSA (obstructive sleep apnea) -body habitus c/w same -using nocturnal CPAP since admit    DVT prophylaxis: SCDs Code Status: Full Family Communication: Patient Disposition Plan: Stepdown Isolation: Contact/MRSA PCR positive Nutritional Status: Mild acute malnutrition related to acute gastrointestinal illness/critical  illness but tolerating solid diet now  Consultants: Nephrology-signed off Hematology  Procedures: L [place] TLC 5/30>>>  R a-line 5/30>>>6/4  ET tube 5/31>>>6/4  L femoral trialysis catheter 5/30>>>  CULTURES:  Blood 5/30>>>Coag neg staph  Urine 5/30>>>neg  Sputum 5/30>>>Mold  Antibiotics: Vanc 5/30>>>6/4  Zosyn 5/30>>> 6/2  Doxycycline 5/31>>>  Ancef 6/4>>>   HPI/Subjective: Alert. No complaints   Objective: Blood pressure 149/90, pulse 95, temperature 97.4 F (36.3 C), temperature source Oral, resp. rate 26, height 5\' 11"  (1.803 m), weight 135 kg (297 lb 9.9 oz), SpO2 98.00%.  Intake/Output Summary (Last 24 hours) at 06/15/12 1634 Last data filed at 06/15/12 0800  Gross per 24 hour  Intake 402.17 ml  Output   1850 ml  Net -1447.83 ml    Exam: General: No acute respiratory distress Lungs: Clear to auscultation bilaterally without wheezes or crackles, RA Cardiovascular: Irregular rate and rhythm without murmur gallop or rub normal S1 and S2; improving peripheral edema with weeping  Abdomen: Nontender, nondistended, soft, bowel sounds positive, no rebound, no ascites, no appreciable mass Musculoskeletal: No significant cyanosis, clubbing of bilateral lower extremities Neurological: Alert and oriented x 3, moves all extremities x 4 without focal neurological deficits, CN 2-12 intact  Scheduled Meds: Scheduled Meds: . anticoagulant sodium citrate  5 mL Intracatheter Once  .  ceFAZolin (ANCEF) IV  2 g Intravenous Q8H  . diltiazem  240 mg Oral Daily  . hydrALAZINE  25 mg Oral Q6H  . hydrocortisone sodium succinate  25 mg Intravenous Q12H  . metoprolol tartrate  100 mg Oral BID  . pantoprazole  40 mg Oral Daily   Continuous Infusions: . sodium chloride 250 mL (06/14/12 0222)  . citrate dextrose      Data Reviewed: Basic Metabolic Panel:  Recent Labs Lab 06/10/12 0330  06/10/12 1400 06/11/12 0448  06/12/12 0440 06/12/12 2000 06/13/12 0420 06/14/12 0400  06/15/12 0855  NA 153*  < > 153* 156*  < > 148* 141 142 144 142  K 2.9*  < > 3.3* 3.6  < > 2.9* 3.6 3.6 3.3* 2.9*  CL 103  < > 104 107  < > 101 99 99 100 101  CO2 42*  --  41* 40*  < > 41* 35* 36* 36* 34*  GLUCOSE 172*  < > 226* 210*  < > 123* 159* 119* 150* 105*  BUN 75*  < > 66* 63*  < > 45* 41* 39* 36* 33*  CREATININE 2.20*  < > 1.84* 1.54*  < > 1.23 1.19 1.19 1.17 1.21  CALCIUM 9.3  --  9.5 9.3  < > 9.1 8.6 8.6 8.7 8.1*  MG 1.7  --   --  1.8  --  1.5 2.1 1.9  --   --   PHOS 3.0  --  3.0 2.6  --  3.4  --  3.2  --   --   < > = values in this interval not displayed. Liver Function Tests:  Recent Labs Lab 06/09/12 0421 06/10/12 1400 06/14/12 0400 06/15/12 0855  AST  --   --  24 25  ALT  --   --  24 18  ALKPHOS  --   --  60 61  BILITOT  --   --  0.9 0.7  PROT  --   --  5.5* 5.6*  ALBUMIN 2.5* 2.9* 2.9* 2.8*   No results found for this basename: LIPASE, AMYLASE,  in the last 168 hours No results found for  this basename: AMMONIA,  in the last 168 hours CBC:  Recent Labs Lab 06/11/12 0448 06/12/12 0440 06/13/12 0420 06/14/12 0400 06/15/12 0855  WBC 4.1 6.1 6.7 4.8 5.6  HGB 8.5* 8.4* 8.7* 8.4* 8.5*  HCT 27.1* 26.7* 27.2* 25.5* 26.1*  MCV 87.1 87.8 85.8 85.3 85.9  PLT 99* 108* 117* 115* 126*   Cardiac Enzymes: No results found for this basename: CKTOTAL, CKMB, CKMBINDEX, TROPONINI,  in the last 168 hours BNP (last 3 results) No results found for this basename: PROBNP,  in the last 8760 hours CBG:  Recent Labs Lab 06/13/12 0104 06/13/12 0353 06/13/12 0800 06/13/12 1145 06/14/12 0021  GLUCAP 116* 129* 102* 127* 156*    Recent Results (from the past 240 hour(s))  CULTURE, BLOOD (ROUTINE X 2)     Status: None   Collection Time    06/06/12 11:30 AM      Result Value Range Status   Specimen Description BLOOD RIGHT ANTECUBITAL   Final   Special Requests BOTTLES DRAWN AEROBIC AND ANAEROBIC 10CC   Final   Culture  Setup Time 06/06/2012 18:25   Final   Culture      Final   Value: STAPHYLOCOCCUS SPECIES (COAGULASE NEGATIVE)     Note: SUSCEPTIBILITIES PERFORMED ON PREVIOUS CULTURE WITHIN THE LAST 5 DAYS.     Note: Gram Stain Report Called to,Read Back By and Verified With: SABO [facility] 06/07/12 @ 12:45PM BY RUSCA.   Report Status 06/10/2012 FINAL   Final  CULTURE, BLOOD (ROUTINE X 2)     Status: None   Collection Time    06/06/12 11:45 AM      Result Value Range Status   Specimen Description BLOOD RIGHT ANTECUBITAL   Final   Special Requests BOTTLES DRAWN AEROBIC ONLY 5CC   Final   Culture  Setup Time 06/06/2012 18:25   Final   Culture     Final   Value: STAPHYLOCOCCUS SPECIES (COAGULASE NEGATIVE)     Note: RIFAMPIN AND GENTAMICIN SHOULD NOT BE USED AS SINGLE DRUGS FOR TREATMENT OF STAPH INFECTIONS.     Note: Gram Stain Report Called to,Read Back By and Verified With: KATRICE KELLER 06/07/12 @ 5:38PM BY RUSCA.   Report Status 06/10/2012 FINAL   Final   Organism ID, Bacteria STAPHYLOCOCCUS SPECIES (COAGULASE NEGATIVE)   Final  URINE CULTURE     Status: None   Collection Time    06/06/12 11:59 AM      Result Value Range Status   Specimen Description URINE, CATHETERIZED   Final   Special Requests NONE   Final   Culture  Setup Time 06/06/2012 19:34   Final   Colony Count NO GROWTH   Final   Culture NO GROWTH   Final   Report Status 06/07/2012 FINAL   Final  MRSA PCR SCREENING     Status: Abnormal   Collection Time    06/06/12  2:01 PM      Result Value Range Status   MRSA by PCR POSITIVE (*) NEGATIVE Final   Comment:            The GeneXpert MRSA Assay (FDA     approved for NASAL specimens     only), is one component of a     comprehensive MRSA colonization     surveillance program. It is not     intended to diagnose MRSA     infection nor to guide or     monitor treatment for     MRSA  infections.     RESULT CALLED TO, READ BACK BY AND VERIFIED WITH:     R. FOUNTAIN RN 16:45 06/06/12 ([email or web address])  URINE CULTURE     Status: None   Collection Time     06/06/12  4:59 PM      Result Value Range Status   Specimen Description URINE, CATHETERIZED   Final   Special Requests Normal   Final   Culture  Setup Time 06/06/2012 18:12   Final   Colony Count NO GROWTH   Final   Culture NO GROWTH   Final   Report Status 06/07/2012 FINAL   Final  CULTURE, RESPIRATORY (NON-EXPECTORATED)     Status: None   Collection Time    06/07/12 11:44 AM      Result Value Range Status   Specimen Description TRACHEAL ASPIRATE   Final   Special Requests NONE   Final   Gram Stain PENDING   Incomplete   Culture FEW Fungus isolated-identification to follow   Final   Report Status PENDING   Incomplete  STOOL CULTURE     Status: None   Collection Time    06/07/12  3:41 PM      Result Value Range Status   Specimen Description STOOL   Final   Special Requests Normal   Final   Culture     Final   Value: NO SALMONELLA, SHIGELLA, CAMPYLOBACTER, YERSINIA, OR E.COLI 0157:H7 ISOLATED     Note: REDUCED NORMAL FLORA PRESENT   Report Status 06/11/2012 FINAL   Final  CLOSTRIDIUM DIFFICILE BY PCR     Status: None   Collection Time    06/07/12  3:41 PM      Result Value Range Status   C difficile by pcr NEGATIVE  NEGATIVE Final  CULTURE, BLOOD (ROUTINE X 2)     Status: None   Collection Time    06/08/12  6:31 PM      Result Value Range Status   Specimen Description BLOOD LEFT ARM   Final   Special Requests BOTTLES DRAWN AEROBIC ONLY 6CC   Final   Culture  Setup Time 06/09/2012 02:06   Final   Culture NO GROWTH 5 DAYS   Final   Report Status 06/15/2012 FINAL   Final     Studies:  Recent x-ray studies have been reviewed in detail by the Attending Physician  Scheduled Meds:  Reviewed in detail by the Attending Physician   Calvert Cantor, MD [facility] Office  [phone] Pager [phone]  **If unable to reach the above provider after paging please contact the Flow Manager @ [phone]  On-Call/Text Page:      [email or web address]      password [email or web address]  If  7PM-7AM, please contact night-coverage [email or web address] Password [facility] 06/15/2012, 4:34 PM   LOS: 9 days

## 2012-06-15 NOTE — Progress Notes (Signed)
Pt will place himself on home machine when ready. No distress noted at this time.

## 2012-06-16 ENCOUNTER — Inpatient Hospital Stay (HOSPITAL_COMMUNITY): Payer: Medicare Other | Admitting: Anesthesiology

## 2012-06-16 ENCOUNTER — Inpatient Hospital Stay (HOSPITAL_COMMUNITY): Payer: Medicare Other

## 2012-06-16 ENCOUNTER — Encounter (HOSPITAL_COMMUNITY): Payer: Self-pay | Admitting: Anesthesiology

## 2012-06-16 DIAGNOSIS — E876 Hypokalemia: Secondary | ICD-10-CM | POA: Diagnosis not present

## 2012-06-16 DIAGNOSIS — R6520 Severe sepsis without septic shock: Secondary | ICD-10-CM

## 2012-06-16 DIAGNOSIS — A419 Sepsis, unspecified organism: Secondary | ICD-10-CM

## 2012-06-16 DIAGNOSIS — I469 Cardiac arrest, cause unspecified: Secondary | ICD-10-CM

## 2012-06-16 DIAGNOSIS — I4891 Unspecified atrial fibrillation: Secondary | ICD-10-CM

## 2012-06-16 LAB — URINALYSIS W MICROSCOPIC + REFLEX CULTURE
Glucose, UA: 100 mg/dL — AB
Hgb urine dipstick: NEGATIVE
Ketones, ur: NEGATIVE mg/dL
Protein, ur: NEGATIVE mg/dL

## 2012-06-16 LAB — BASIC METABOLIC PANEL
BUN: 28 mg/dL — ABNORMAL HIGH (ref 6–23)
BUN: 27 mg/dL — ABNORMAL HIGH (ref 6–23)
CO2: 31 mEq/L (ref 19–32)
CO2: 30 mEq/L (ref 19–32)
Calcium: 8.2 mg/dL — ABNORMAL LOW (ref 8.4–10.5)
Calcium: 8 mg/dL — ABNORMAL LOW (ref 8.4–10.5)
Creatinine, Ser: 1.21 mg/dL (ref 0.50–1.35)
Creatinine, Ser: 1.22 mg/dL (ref 0.50–1.35)
GFR calc non Af Amer: 60 mL/min — ABNORMAL LOW (ref >90)
Glucose, Bld: 179 mg/dL — ABNORMAL HIGH (ref 70–99)

## 2012-06-16 LAB — URINALYSIS, ROUTINE W REFLEX MICROSCOPIC
Bilirubin Urine: NEGATIVE
Glucose, UA: 100 mg/dL — AB
Ketones, ur: NEGATIVE mg/dL
Protein, ur: NEGATIVE mg/dL

## 2012-06-16 LAB — CBC
HCT: 25 % — ABNORMAL LOW (ref 39.0–52.0)
MCH: 28 pg (ref 26.0–34.0)
MCV: 85.3 fL (ref 78.0–100.0)
Platelets: 116 10*3/uL — ABNORMAL LOW (ref 150–400)
RBC: 2.93 MIL/uL — ABNORMAL LOW (ref 4.22–5.81)

## 2012-06-16 LAB — POCT I-STAT 3, ART BLOOD GAS (G3+)
Acid-Base Excess: 9 mmol/L — ABNORMAL HIGH (ref 0.0–2.0)
Bicarbonate: 31.3 mEq/L — ABNORMAL HIGH (ref 20.0–24.0)
O2 Saturation: 100 %
pO2, Arterial: 317 mmHg — ABNORMAL HIGH (ref 80.0–100.0)

## 2012-06-16 LAB — POCT I-STAT, CHEM 8
BUN: 38 mg/dL — ABNORMAL HIGH (ref 6–23)
Calcium, Ion: 1.14 mmol/L (ref 1.13–1.30)
Creatinine, Ser: 1.2 mg/dL (ref 0.50–1.35)
Hemoglobin: 8.8 g/dL — ABNORMAL LOW (ref 13.0–17.0)
Sodium: 140 mEq/L (ref 135–145)
TCO2: 35 mmol/L (ref 0–100)

## 2012-06-16 LAB — NA AND K (SODIUM & POTASSIUM), RAND UR: Sodium, Ur: 74 mEq/L

## 2012-06-16 LAB — LACTIC ACID, PLASMA: Lactic Acid, Venous: 3.9 mmol/L — ABNORMAL HIGH (ref 0.5–2.2)

## 2012-06-16 MED ORDER — ACETAMINOPHEN 325 MG PO TABS: 650.0000 mg | ORAL_TABLET | ORAL | Status: DC | PRN

## 2012-06-16 MED ORDER — LORAZEPAM 2 MG/ML IJ SOLN
INTRAMUSCULAR | Status: AC
  Administered 2012-06-16: 2 mg via INTRAVENOUS
  Filled 2012-06-16: qty 1

## 2012-06-16 MED ORDER — POTASSIUM CHLORIDE CRYS ER 20 MEQ PO TBCR: 40.0000 meq | EXTENDED_RELEASE_TABLET | Freq: Three times a day (TID) | ORAL | Status: DC

## 2012-06-16 MED ORDER — DILTIAZEM HCL 60 MG PO TABS
60.0000 mg | ORAL_TABLET | Freq: Four times a day (QID) | ORAL | Status: DC
  Administered 2012-06-16 – 2012-06-18 (×6): 60 mg via ORAL
  Filled 2012-06-16 (×10): qty 1

## 2012-06-16 MED ORDER — DIPHENHYDRAMINE HCL 25 MG PO CAPS: 25.0000 mg | ORAL_CAPSULE | Freq: Four times a day (QID) | ORAL | Status: DC | PRN

## 2012-06-16 MED ORDER — POTASSIUM CHLORIDE CRYS ER 20 MEQ PO TBCR
40.0000 meq | EXTENDED_RELEASE_TABLET | Freq: Once | ORAL | Status: AC
  Administered 2012-06-16: 40 meq via ORAL
  Filled 2012-06-16: qty 2

## 2012-06-16 MED ORDER — MIDAZOLAM HCL 2 MG/2ML IJ SOLN
INTRAMUSCULAR | Status: AC
  Administered 2012-06-16: 2 mg
  Filled 2012-06-16: qty 2

## 2012-06-16 MED ORDER — ANTICOAGULANT SODIUM CITRATE 4% (200MG/5ML) IV SOLN
5.0000 mL | Freq: Once | Status: DC
  Filled 2012-06-16 (×2): qty 250

## 2012-06-16 MED ORDER — POTASSIUM CHLORIDE 10 MEQ/100ML IV SOLN
10.0000 meq | INTRAVENOUS | Status: DC
  Filled 2012-06-16 (×6): qty 100

## 2012-06-16 MED ORDER — CHLORHEXIDINE GLUCONATE 0.12 % MT SOLN
OROMUCOSAL | Status: AC
  Administered 2012-06-16: 15 mL
  Filled 2012-06-16: qty 15

## 2012-06-16 MED ORDER — SODIUM CHLORIDE 0.9 % IV SOLN
4.0000 g | Freq: Once | INTRAVENOUS | Status: DC
  Administered 2012-06-16: 4 g via INTRAVENOUS
  Filled 2012-06-16 (×2): qty 40

## 2012-06-16 MED ORDER — DIPHENHYDRAMINE HCL 25 MG PO CAPS
ORAL_CAPSULE | ORAL | Status: AC
  Administered 2012-06-16: 25 mg via ORAL
  Filled 2012-06-16: qty 1

## 2012-06-16 MED ORDER — METHYLPREDNISOLONE SODIUM SUCC 125 MG IJ SOLR
80.0000 mg | Freq: Once | INTRAMUSCULAR | Status: DC
  Filled 2012-06-16: qty 1.28

## 2012-06-16 MED ORDER — DILTIAZEM HCL ER COATED BEADS 240 MG PO CP24
240.0000 mg | ORAL_CAPSULE | Freq: Every day | ORAL | Status: DC
  Filled 2012-06-16: qty 1

## 2012-06-16 MED ORDER — CALCIUM CARBONATE ANTACID 500 MG PO CHEW
2.0000 | CHEWABLE_TABLET | ORAL | Status: DC
  Filled 2012-06-16 (×2): qty 2

## 2012-06-16 MED ORDER — PANTOPRAZOLE SODIUM 40 MG IV SOLR
40.0000 mg | INTRAVENOUS | Status: DC
  Administered 2012-06-17 – 2012-06-18 (×2): 40 mg via INTRAVENOUS
  Filled 2012-06-16 (×2): qty 40

## 2012-06-16 MED ORDER — ACD FORMULA A 0.73-2.45-2.2 GM/100ML VI SOLN
Status: AC
  Administered 2012-06-16: 500 mL via INTRAVENOUS
  Filled 2012-06-16: qty 500

## 2012-06-16 MED ORDER — SODIUM CHLORIDE 0.9 % IV SOLN
1000.0000 mL | INTRAVENOUS | Status: DC
  Administered 2012-06-16: 1000 mL via INTRAVENOUS

## 2012-06-16 MED ORDER — FERROUS SULFATE 325 (65 FE) MG PO TABS
325.0000 mg | ORAL_TABLET | Freq: Two times a day (BID) | ORAL | Status: DC
  Administered 2012-06-17: 325 mg via ORAL
  Filled 2012-06-16 (×6): qty 1

## 2012-06-16 MED ORDER — LORAZEPAM 2 MG/ML IJ SOLN: 2.0000 mg | Freq: Once | INTRAMUSCULAR | Status: AC

## 2012-06-16 MED ORDER — ACD FORMULA A 0.73-2.45-2.2 GM/100ML VI SOLN
500.0000 mL | Status: DC
  Filled 2012-06-16: qty 500

## 2012-06-16 MED ORDER — CALCIUM CARBONATE ANTACID 500 MG PO CHEW
CHEWABLE_TABLET | ORAL | Status: AC
  Filled 2012-06-16: qty 2

## 2012-06-16 MED ORDER — POTASSIUM CHLORIDE 10 MEQ/50ML IV SOLN
10.0000 meq | INTRAVENOUS | Status: AC
  Administered 2012-06-16 (×3): 10 meq via INTRAVENOUS
  Filled 2012-06-16: qty 150

## 2012-06-16 MED ORDER — MIDAZOLAM HCL 2 MG/2ML IJ SOLN
1.0000 mg | INTRAMUSCULAR | Status: DC | PRN
  Administered 2012-06-17 (×3): 2 mg via INTRAVENOUS
  Filled 2012-06-16 (×6): qty 2

## 2012-06-16 MED ORDER — FENTANYL CITRATE 0.05 MG/ML IJ SOLN
25.0000 ug | INTRAMUSCULAR | Status: DC | PRN
  Administered 2012-06-16 – 2012-06-17 (×4): 50 ug via INTRAVENOUS
  Filled 2012-06-16 (×4): qty 2

## 2012-06-16 NOTE — Progress Notes (Signed)
15:45-Pt transferred to unit 6700 from 2600. Received report from [place], RN on 2600. Upon arrival to unit 6700, patient was immediately picked up and transported to the Dialysis unit for Plasmaphoresis treatment. Assessment of patient was not done upon arrival to unit as patient was not present on the unit long enough to perform a full assessment. 18:30- Pt coded while receiving Plasmaphoresis. Report called to [place], RN on 2900.  Patient's belongings were taken to unit 2900.

## 2012-06-16 NOTE — Progress Notes (Signed)
PT Cancellation Note  Patient Details Name: SEAVER MACHIA MRN: [id number] DOB: [birth year]   Cancelled Treatment:    Reason Eval/Treat Not Completed:  (pt left for DIA.) PT to return as able.   Hamzeh Tall, Becky Sax 06/16/2012, 4:21 PM

## 2012-06-16 NOTE — Progress Notes (Signed)
Report attempted 

## 2012-06-16 NOTE — Anesthesia Preprocedure Evaluation (Signed)
Anesthesia Evaluation Anesthesia Physical Anesthesia Plan  ASA: IV and emergent  Anesthesia Plan:    Post-op Pain Management:    Induction:   Airway Management Planned:   Additional Equipment:   Intra-op Plan:   Post-operative Plan:   Informed Consent:   Plan Discussed with:   Anesthesia Plan Comments:         Anesthesia Quick Evaluation  

## 2012-06-16 NOTE — Consult Note (Addendum)
WOC consult requested for edema to BLE.  No open wounds, patchy areas of red dry scaly skin.  Pt states he uses a cream which was ordered by Dermatology to assist with skin condition.  He does not know the name of this cream and it is not listed on his MAR.  No open wounds at present. Small amt yellow drainage.  If primary team prefers to order cream, then kerlex and ace wraps can be applied over topical treatment to decrease edema. Please re-consult if further assistance is needed.  Thank-you,  Cammie Mcgee MSN, RN, CWOCN, [place], CNS [phone]

## 2012-06-16 NOTE — Progress Notes (Signed)
PULMONARY  / CRITICAL CARE MEDICINE  Name: Brett [name] MRN: [id number] DOB: October 27, 1945    ADMISSION DATE:  06/06/2012 CONSULTATION DATE:    REFERRING MD :  [facility] PRIMARY SERVICE:  PCCM  CHIEF COMPLAINT:  S/p PEA cardiac arrest  BRIEF PATIENT DESCRIPTION: 78 to with TTP who suffered PEA cardia arrest during plasmapheresis, was briefly resuscitated with ROSC and transferred to 2900 unit for further management.  PCCM was consulted.  SIGNIFICANT EVENTS / STUDIES:  5/30 TTE >>> Normal EF, NO vegetation 6/9   PEA cardiac arrest, intubated, transferred to 2900  LINES / TUBES: OETT 6/9 >>> OGT 6/9 >>> Foley 6/9 >>> L fem DH cath 5/30 >>>  CULTURES: 5/30  Blood >>> Coag-negative Staph 5/30  Urine >>> NTD 6/1    Blood >>> NTD  ANTIBIOTICS: Vanc 5/30 >>> 6/4  Zosyn 5/30 >>> 6/1  Doxycycline 5/31>>> 6/5  Ancef 6/4 >>>  The patient is encephalopathic and unable to provide history, which was obtained for available medical records.  HISTORY OF PRESENT ILLNESS:  67 to with TTP who suffered PEA cardia arrest during plasmapheresis, was briefly resuscitated with ROSC and transferred to 2900 unit for further management.  PCCM was consulted.  PAST MEDICAL HISTORY :  Past Medical History  Diagnosis Date  . Persistent atrial fibrillation 02/2011    Negative Myoview; Normal EF by Echo  . Obesity (BMI 30-39.9)   . Hypertension   . Dyslipidemia    Past Surgical History  Procedure Laterality Date  . Cardioversion  06/08/2011    Procedure: CARDIOVERSION;  Surgeon: Marykay Lex, MD;  Location: [facility] OR;  Service: Cardiovascular;  Laterality: N/A;  . Joint replacement     Prior to Admission medications   Medication Sig Start Date End Date Taking? Authorizing Provider  aspirin EC 81 MG tablet Take 81 mg by mouth daily.   Yes Historical Provider, MD  dabigatran (PRADAXA) 150 MG CAPS Take 150 mg by mouth every 12 (twelve) hours.   Yes Historical Provider, MD  dronedarone (MULTAQ) 400 MG  tablet Take 400 mg by mouth 2 (two) times daily with a meal.   Yes Historical Provider, MD  fexofenadine (ALLEGRA) 180 MG tablet Take 180 mg by mouth daily.   Yes Historical Provider, MD  lisinopril-hydrochlorothiazide (PRINZIDE,ZESTORETIC) 20-25 MG per tablet Take 0.5 tablets by mouth daily.   Yes Historical Provider, MD  metoprolol succinate (TOPROL-XL) 100 MG 24 hr tablet Take 100 mg by mouth daily. Take with or immediately following a meal.   Yes Historical Provider, MD  Multiple Vitamin (MULITIVITAMIN WITH MINERALS) TABS Take 1 tablet by mouth daily.   Yes Historical Provider, MD  niacin (NIASPAN) 500 MG CR tablet Take 500 mg by mouth at bedtime.   Yes Historical Provider, MD   No Known Allergies  FAMILY HISTORY:  Family History  Problem Relation Age of Onset  . Heart failure Mother 12   SOCIAL HISTORY:  reports that he has quit smoking. His smoking use included Cigars. He does not have any smokeless tobacco history on file. He reports that he does not drink alcohol or use illicit drugs.  REVIEW OF SYSTEMS:  Unable to provide.  INTERVAL HISTORY:  VITAL SIGNS: Temp:  [97.4 F (36.3 C)-98.9 F (37.2 C)] 98.8 F (37.1 C) (06/09 1753) Pulse Rate:  [53-114] 53 (06/09 1954) Resp:  [12-34] 16 (06/09 1954) BP: (132-170)/(81-104) 170/97 mmHg (06/09 1800) SpO2:  [95 %-100 %] 100 % (06/09 1954) FiO2 (%):  [100 %] 100 % (  06/09 1954) Weight:  [135 kg (297 lb 9.9 oz)] 135 kg (297 lb 9.9 oz) (06/09 0007)  HEMODYNAMICS:   VENTILATOR SETTINGS: Vent Mode:  [-] PRVC FiO2 (%):  [100 %] 100 % Set Rate:  [16 bmp] 16 bmp Vt Set:  [550 mL] 550 mL PEEP:  [5 cmH20] 5 cmH20 Plateau Pressure:  [16 cmH20] 16 cmH20  INTAKE / OUTPUT: Intake/Output     06/09 0701 - 06/10 0700   Urine (mL/kg/hr) 800 (0.4)   Total Output 800   Net -800        PHYSICAL EXAMINATION: General:  Mechanically ventilated, synchronous Neuro:  Encephalopathic but follows some commands, nonfocal, cough / gag  present HEENT:  PERRL, OETT / OGT Cardiovascular:  No murmurs Lungs:  Bilateral diminished air entry, no w/r/r Abdomen:  Soft, nontender, bowel sounds diminished Musculoskeletal:  Moves all extremities, no edema Skin:  Intact  LABS:  Recent Labs Lab 06/10/12 0330  06/10/12 1400 06/11/12 0448  06/12/12 0440 06/12/12 2000 06/13/12 0420  06/14/12 0400 06/15/12 0855 06/16/12 0535 06/16/12 1810  HGB 8.7*  < >  --  8.5*  --  8.4*  --  8.7*  < > 8.4* 8.5* 8.2*  --   WBC 4.9  --   --  4.1  --  6.1  --  6.7  --  4.8 5.6 5.2  --   PLT 63*  --   --  99*  --  108*  --  117*  --  115* 126* 116*  --   NA 153*  < > 153* 156*  < > 148* 141 142  < > 144 142 141 144  K 2.9*  < > 3.3* 3.6  < > 2.9* 3.6 3.6  < > 3.3* 2.9* 2.9* 3.6  CL 103  < > 104 107  < > 101 99 99  < > 100 101 102 102  CO2 42*  --  41* 40*  < > 41* 35* 36*  --  36* 34* 31 30  GLUCOSE 172*  < > 226* 210*  < > 123* 159* 119*  < > 150* 105* 86 179*  BUN 75*  < > 66* 63*  < > 45* 41* 39*  < > 36* 33* 28* 27*  CREATININE 2.20*  < > 1.84* 1.54*  < > 1.23 1.19 1.19  < > 1.17 1.21 1.21 1.22  CALCIUM 9.3  --  9.5 9.3  < > 9.1 8.6 8.6  --  8.7 8.1* 8.2* 8.0*  MG 1.7  --   --  1.8  --  1.5 2.1 1.9  --   --   --   --   --   PHOS 3.0  --  3.0 2.6  --  3.4  --  3.2  --   --   --   --   --   AST  --   --   --   --   --   --   --   --   --  24 25  --   --   ALT  --   --   --   --   --   --   --   --   --  24 18  --   --   ALKPHOS  --   --   --   --   --   --   --   --   --  60 61  --   --  BILITOT  --   --   --   --   --   --   --   --   --  0.9 0.7  --   --   PROT  --   --   --   --   --   --   --   --   --  5.5* 5.6*  --   --   ALBUMIN  --   --  2.9*  --   --   --   --   --   --  2.9* 2.8*  --   --   APTT 39*  --   --   --   --   --   --   --   --   --   --   --   --   INR 1.29  --   --   --   --  1.17  --   --   --   --   --   --   --   < > = values in this interval not displayed.  Recent Labs Lab 06/13/12 0104 06/13/12 0353  06/13/12 0800 06/13/12 1145 06/14/12 0021  GLUCAP 116* 129* 102* 127* 156*   CXR:  6/9 >>>  ASSESSMENT / PLAN:  PULMONARY A:  Acute respiratory failure in setting of cardiac arrest.  OSA on nocturnal CPAP. P:   Gaol SpO2>92, pH>7.30 Full mechanical support Daily SBT Trend ABG / CXR  CARDIOVASCULAR A: S/p PEA arrest during Plasmapheresis, suspected transfusion reaction.  ? PE but now hemodynamically stable and not hypoxic.  Septic shock secondary to Staph bacteremia upon admission - resolved. P:  12 lead EKG Trend troponin / lactate TTE May need TEE when stable per ID Continue Cardizem, Metoprolol, Hydralazine Cardiology following Reluctant to send for chest CTA as recent AKI and low clinical probability PE BL venous Doppler LE  RENAL A:  Hypokalemia. P:   Trend BMP Replete K PRN Check Mg NS@100  Nephrology following  GASTROINTESTINAL A:  No active issues. P:   NPO as intubated TF if remains intubated > 24 hours Protonix for GI Px  HEMATOLOGIC A:  TTP.  Anemia. P:  Trend CBC Hematology following SCDs for DVT Px Transfusion reaction workup Ferrous Sulfate  INFECTIOUS A:  Staph bacteremia, source is unclear. P:   Cultures and antibiotics as above ID follwoing  ENDOCRINE  A:  No active issues. P:   No intervention required  NEUROLOGIC A:  Encephalopathy post cardiac arrest.  Follows commands. P:   No indications for therapeutic hypothermia Fentanyl / Versed PRN Goal RASS 0 to -1  TODAY'S SUMMARY: PEA cardiac arrest in setting of plasmapheresis / transfusion. Transfusion reaction workup per blood bank. Intubated.  PE is not very likely.  Tonight:  Full mechanical support, PRN sedation, hypothermia is not indicated - brief CPR, intact neurologically.  BL venous Doppler in AM.   I have personally obtained a history, examined the patient, evaluated laboratory and imaging results, formulated the assessment and plan and placed orders.  CRITICAL CARE:   The patient is critically ill with multiple organ systems failure and requires high complexity decision making for assessment and support, frequent evaluation and titration of therapies, application of advanced monitoring technologies and extensive interpretation of multiple databases. Critical Care Time devoted to patient care services described in this note is 45 minutes.   Lonia Farber, MD Pulmonary and Critical Care Medicine [facility] [address] Pager: [phone]  06/16/2012, 8:12 PM

## 2012-06-16 NOTE — Consult Note (Addendum)
[facility] INPATIENT PROGRESS NOTE  Name: Brett [name]      MRN: [id number]    Location: 2606/2606-01  Date: 06/16/2012 Time:2:54 PM   Subjective: Interval History:Brett [name] reported feeling almost at baseline.  He was able to ambulate with PT.  He still has venous stasis skin changes bilateral lower extremity and weeping serous discharge.  He denied fever, SOB, chest pain, bleeding symptoms.    Objective: Vital signs in last 24 hours: Temp:  [97.8 F (36.6 C)-98.5 F (36.9 C)] 98.1 F (36.7 C) (06/09 0753) Pulse Rate:  [93-114] 114 (06/09 1144) Resp:  [8-22] 12 (06/09 0753) BP: (132-151)/(81-94) 138/82 mmHg (06/09 1258) SpO2:  [95 %-96 %] 95 % (06/09 0753) Weight:  [297 lb 9.9 oz (135 kg)] 297 lb 9.9 oz (135 kg) (06/09 0007)     PHYSICAL EXAM:  General: Obese man, no longer in rigour.  Eyes: no scleral icterus. ENT: clear oropharynx. There was no blood in his nose. Respiratory: lungs showed mild bibasilar crackles.  Cardiovascular: Regular rate and rhythm, S1/S2, without murmur, rub or gallop. There was 2+pedal edema with venous stasis changes and weeping discharge . GI: abdomen was soft, flat, nontender, nondistended, without organomegaly.   Studies/Results: Results for orders placed during the hospital encounter of 06/06/12 (from the past 48 hour(s))  CBC     Status: Abnormal   Collection Time    06/15/12  8:55 AM      Result Value Range   WBC 5.6  4.0 - 10.5 K/uL   RBC 3.04 (*) 4.22 - 5.81 MIL/uL   Hemoglobin 8.5 (*) 13.0 - 17.0 g/dL   HCT 40.9 (*) 81.1 - 91.4 %   MCV 85.9  78.0 - 100.0 fL   MCH 28.0  26.0 - 34.0 pg   MCHC 32.6  30.0 - 36.0 g/dL   RDW 78.2 (*) 95.6 - 21.3 %   Platelets 126 (*) 150 - 400 K/uL  RETICULOCYTES     Status: Abnormal   Collection Time    06/15/12  8:55 AM      Result Value Range   Retic Ct Pct 5.8 (*) 0.4 - 3.1 %   RBC. 3.04 (*) 4.22 - 5.81 MIL/uL   Retic Count, Manual 176.3  19.0 - 186.0 K/uL  LACTATE  DEHYDROGENASE     Status: None   Collection Time    06/15/12  8:55 AM      Result Value Range   LDH 249  94 - 250 U/L  COMPREHENSIVE METABOLIC PANEL     Status: Abnormal   Collection Time    06/15/12  8:55 AM      Result Value Range   Sodium 142  135 - 145 mEq/L   Potassium 2.9 (*) 3.5 - 5.1 mEq/L   Chloride 101  96 - 112 mEq/L   CO2 34 (*) 19 - 32 mEq/L   Glucose, Bld 105 (*) 70 - 99 mg/dL   BUN 33 (*) 6 - 23 mg/dL   Creatinine, Ser 0.86  0.50 - 1.35 mg/dL   Calcium 8.1 (*) 8.4 - 10.5 mg/dL   Total Protein 5.6 (*) 6.0 - 8.3 g/dL   Albumin 2.8 (*) 3.5 - 5.2 g/dL   AST 25  0 - 37 U/L   ALT 18  0 - 53 U/L   Alkaline Phosphatase 61  39 - 117 U/L   Total Bilirubin 0.7  0.3 - 1.2 mg/dL   GFR calc non Af Amer 60 (*) >  90 mL/min   GFR calc Af Amer 70 (*) >90 mL/min   Comment:            The eGFR has been calculated     using the CKD EPI equation.     This calculation has not been     validated in all clinical     situations.     eGFR's persistently     <90 mL/min signify     possible Chronic Kidney Disease.  CBC     Status: Abnormal   Collection Time    06/16/12  5:35 AM      Result Value Range   WBC 5.2  4.0 - 10.5 K/uL   RBC 2.93 (*) 4.22 - 5.81 MIL/uL   Hemoglobin 8.2 (*) 13.0 - 17.0 g/dL   HCT 16.1 (*) 09.6 - 04.5 %   MCV 85.3  78.0 - 100.0 fL   MCH 28.0  26.0 - 34.0 pg   MCHC 32.8  30.0 - 36.0 g/dL   RDW 40.9 (*) 81.1 - 91.4 %   Platelets 116 (*) 150 - 400 K/uL   Comment: CONSISTENT WITH PREVIOUS RESULT  BASIC METABOLIC PANEL     Status: Abnormal   Collection Time    06/16/12  5:35 AM      Result Value Range   Sodium 141  135 - 145 mEq/L   Potassium 2.9 (*) 3.5 - 5.1 mEq/L   Chloride 102  96 - 112 mEq/L   CO2 31  19 - 32 mEq/L   Glucose, Bld 86  70 - 99 mg/dL   BUN 28 (*) 6 - 23 mg/dL   Creatinine, Ser 7.82  0.50 - 1.35 mg/dL   Calcium 8.2 (*) 8.4 - 10.5 mg/dL   GFR calc non Af Amer 60 (*) >90 mL/min   GFR calc Af Amer 70 (*) >90 mL/min   Comment:             The eGFR has been calculated     using the CKD EPI equation.     This calculation has not been     validated in all clinical     situations.     eGFR's persistently     <90 mL/min signify     possible Chronic Kidney Disease.  THERAPEUTIC PLASMA EXCHANGE     Status: None   Collection Time    06/16/12 11:56 AM      Result Value Range   Plasma Exchange 4000 ML THAWED     Plasma volume needed 4000     Unit Number [id number]     Blood Component Type THAWED PLASMA     Unit division 00     Status of Unit ALLOCATED     Transfusion Status OK TO TRANSFUSE     Unit Number [id number]     Blood Component Type THAWED PLASMA     Unit division 00     Status of Unit ALLOCATED     Transfusion Status OK TO TRANSFUSE     Unit Number [id number]     Blood Component Type THAWED PLASMA     Unit division 00     Status of Unit ALLOCATED     Transfusion Status OK TO TRANSFUSE     Unit Number [id number]     Blood Component Type THAWED PLASMA     Unit division 00     Status of Unit ALLOCATED     Transfusion Status OK TO TRANSFUSE  Unit Number [id number]     Blood Component Type THAWED PLASMA     Unit division 00     Status of Unit ALLOCATED     Transfusion Status OK TO TRANSFUSE     Unit Number [id number]     Blood Component Type THAWED PLASMA     Unit division 00     Status of Unit ALLOCATED     Transfusion Status OK TO TRANSFUSE     Unit Number [id number]     Blood Component Type THAWED PLASMA     Unit division 00     Status of Unit ALLOCATED     Transfusion Status OK TO TRANSFUSE     Unit Number [id number]     Blood Component Type THAWED PLASMA     Unit division 00     Status of Unit ALLOCATED     Transfusion Status OK TO TRANSFUSE     Unit Number [id number]     Blood Component Type THAWED PLASMA     Unit division 00     Status of Unit ALLOCATED     Transfusion Status OK TO TRANSFUSE     Unit Number [id number]     Blood Component Type THAWED PLASMA      Unit division 00     Status of Unit ALLOCATED     Transfusion Status OK TO TRANSFUSE     Unit Number [id number]     Blood Component Type THAWED PLASMA     Unit division 00     Status of Unit ALLOCATED     Transfusion Status OK TO TRANSFUSE     Unit Number [id number]     Blood Component Type FP24, THAWED     Unit division 00     Status of Unit ALLOCATED     Transfusion Status OK TO TRANSFUSE    NA AND K (SODIUM & POTASSIUM), RAND UR     Status: None   Collection Time    06/16/12  1:10 PM      Result Value Range   Sodium, Ur 74     Potassium Urine Timed 32    CREATININE, URINE, RANDOM     Status: None   Collection Time    06/16/12  1:10 PM      Result Value Range   Creatinine, Urine 83.14    CHLORIDE, URINE, RANDOM     Status: None   Collection Time    06/16/12  1:10 PM      Result Value Range   Chloride Urine 38     No results found.   MEDICATIONS: reviewed.     PROBLEM LIST:  1. Afib; was on Pradaxa  2. Sepsis with blood culture x 2 Staph coag neg; and sputum with fungal species.  3. Acute renal failure from presumably sepsis; now resolved.  4. Fever:  Resolved.  5. Acute anemia:  improved 6. Thrombocytopenia:  improved.  7. Coagulopathy from either sepsis, Pradaxa toxicity, or TTP.  8.  Venous stasis, worsened the last few days with gravity dependence.    RECOMMENDATIONS:   - Continue with empiric antibiotics.  - Continue with q48 hour plasma phoresis this week and twice a week next week and d/c. He is getting phoresed via the femoral catheter which will need to be converted to IJ catheter if he is discharged home before the end of next week. Phoresis can be done as outpatient.  I just need to know when he will be d/c from  hospitalist's perspective to arrange for IJ line and outpatient phoresis.  - I consulted wound care for dressing of his severe venous stasis discharge. - I will start Ferrous Sulfate 325mg  PO BID due to recent severe blood loss.   Continue to monitor CBC and transfuse for Hct <20 or plt <10K or active bleeding.

## 2012-06-16 NOTE — Progress Notes (Signed)
Subjective: No complaints, no chest pain no SOB.  Objective: Vital signs in last 24 hours: Temp:  [97.8 F (36.6 C)-98.5 F (36.9 C)] 98.1 F (36.7 C) (06/09 0753) Pulse Rate:  [93-114] 109 (06/09 0753) Resp:  [8-22] 12 (06/09 0753) BP: (134-151)/(81-94) 147/94 mmHg (06/09 0753) SpO2:  [95 %-96 %] 95 % (06/09 0753) Weight:  [297 lb 9.9 oz (135 kg)] 297 lb 9.9 oz (135 kg) (06/09 0007) Weight change: 0 lb (0 kg) Last BM Date: 06/14/12 Intake/Output from previous day:  -1250  (+6856 since admit) wt 297/9 dowm from 303.5 lbs. 06/08 0701 - 06/09 0700 In: -  Out: 1250 [Urine:1250] Intake/Output this shift: Total I/O In: -  Out: 200 [Urine:200]  PE: General:Pleasant affect, NAD Skin:Warm and dry, brisk capillary refill HEENT:normocephalic, sclera clear, mucus membranes moist Neck:supple, no JVD, no bruits  Heart:irreg irreg without murmur, gallup, rub or click Lungs:clear without rales, rhonchi, or wheezes ZOX:WRUE, non tender, + BS, do not palpate liver spleen or masses Ext:2-3 + lower ext edema -bil.,  2+ radial pulses Neuro:alert and oriented, MAE, follows commands, + facial symmetry   Lab Results:  Recent Labs  06/15/12 0855 06/16/12 0535  WBC 5.6 5.2  HGB 8.5* 8.2*  HCT 26.1* 25.0*  PLT 126* 116*   BMET  Recent Labs  06/15/12 0855 06/16/12 0535  NA 142 141  K 2.9* 2.9*  CL 101 102  CO2 34* 31  GLUCOSE 105* 86  BUN 33* 28*  CREATININE 1.21 1.21  CALCIUM 8.1* 8.2*     Hepatic Function Panel  Recent Labs  06/15/12 0855  PROT 5.6*  ALBUMIN 2.8*  AST 25  ALT 18  ALKPHOS 61  BILITOT 0.7    Studies/Results: No results found.  Medications: I have reviewed the patient's current medications. Scheduled Meds: . anticoagulant sodium citrate  5 mL Intracatheter Once  .  ceFAZolin (ANCEF) IV  2 g Intravenous Q8H  . diltiazem  240 mg Oral Daily  . hydrALAZINE  25 mg Oral Q6H  . metoprolol tartrate  100 mg Oral BID  . pantoprazole  40 mg Oral  Daily   Continuous Infusions: . sodium chloride 250 mL (06/14/12 0222)  . citrate dextrose     PRN Meds:.acetaminophen, acetaminophen, diphenhydrAMINE, diphenhydrAMINE, fentaNYL, pneumococcal 23 valent vaccine  Assessment/Plan: Principal Problem:   TTP (thrombotic thrombocytopenic purpura) Active Problems:   Sepsis with metabolic encephalopathy   Septic shock   ARF (acute renal failure)   Metabolic acidosis   Hyperglycemia   Diarrhea   Acute respiratory failure due to encpehalopathy   Atrial fibrillation with RVR   Bacteremia due to coagulase-negative Staphylococcus   ? Acute ischemic colitis   ? OSA (obstructive sleep apnea)   Acute post-hemorrhagic anemia   Hypokalemia   PLAN: Plts stable at 116.  Hypokalemia continues. Replace. Magnesium was 1.9.  Slow drift down with H/H. A. Fib with improved rate control though still  Up to 125. Cardizem and lopressor ordered at 1000, would change time of cardizem to 0600.  VS stable.  + lower ext edema.  LOS: 10 days   Time spent with pt. :20 minutes. [facility] R  Nurse Practitioner Certified Pager [phone] 06/16/2012, 9:46 AM   I have seen and examined the patient along with [facility] R  NP.  I have reviewed the chart, notes and new data.  I agree with NP's note.  Note that some degree of tachycardia is physiologically appropriate for hemoglobin of 8.2. I  think a mean ventricular rate of 100-110 beats per minute is acceptable. He does not have any overt findings to suggest heart failure and is lying completely supine in bed without dyspnea. The cause of his hypokalemia which is quite severe it is puzzling. He was on hydrochlorothiazide prior to admission but had normal potassium levels on admission. I would not expect him to return to normal sinus rhythm on he may so severely hypokalemic. In fact restoration of sinus rhythm is not desirable until he can be fully and reliably anticoagulated (when his TTP is considered to be in  remission and plasmapheresis was discontinued).  Thurmon Fair, MD, [facility] [phone] 06/16/2012, 4:08 PM

## 2012-06-16 NOTE — Progress Notes (Signed)
06/16/12 1800  Post-apheresis  Post-Procedure Comments Pt c/c of itching while receiveing the last bag of plasma (B-Positive). Pt given Benadryl 25mg  but itching did not resolve; pt continue to c/o of itching all over the body then he lbecame none responsive. rapid respone initially called then after no pulse, cpr started and code blue initiated. pt intubated and sent to 2900. Family callled during code and asked to call hemodialysis, number also left for daughter Mars Scheaffer. primary MD Dr. Judithann Sheen also called to the hemodialysis floor during code.

## 2012-06-16 NOTE — Progress Notes (Signed)
To floor by hemo nurse to complete Plasma exchange.

## 2012-06-16 NOTE — Progress Notes (Signed)
[facility] Progress Note [place] TEAM 1 - Stepdown/ICU TEAM   Brett [name] ZOX:[id number] DOB: Oct 06, 1945 DOA: 06/06/2012 PCP: [name]., MD  Brief narrative: 67 year old male patient with a history of atrial fibrillation on Pradaxa prior to admission. Patient with abrupt onset of nonbloody diarrhea beginning 06/02/2012. Minimal urine output since 06/04/2012. His brother came to check on him 06/05/2012 and found the patient to be very weak and disoriented and called EMS. Upon arrival to the emergency department patient was hypotensive with acute renal failure with apparent normal baseline creatinine in March of 2014 according to his family. Apparently the patient had continued to take his usual home medications including ACE inhibitor and Pradaxa He was also found to have thrombocytopenia with a platelet count of 51,000 - presenting hemoglobin was normal. Because of his hypotension he was given 3 L of IV fluid but blood pressure remained in the 60s. He was empirically started on Zosyn and Vancomycin to cover for possible sepsis. Because of the hypotension he was started on pressors. He was subsequently admitted to the ICU by PCCM.  Within 24 hours after admission the patient developed significant epistaxis, oral bleeding, bleeding from IV lines, hematuria, worsening thrombocytopenia, coagulopathy, and acute blood loss anemia. He received 4 units of FFP, DDAVP, and FEIBA. Unfortunately he continued to bleed and his pancytopenia continued to worsen. Hematology was consulted. It was felt the patient was suffering with acute TTP. He subsequently required hemodialysis acutely to remove the Pradaxa and daily plasmapheresis to treat the TTP.  Since admission patient has stabilized from a hemodynamic standpoint and has been weaned from pressors. Because of acute severe encephalopathy he did require brief intubation until mental status improved. After admission 2 sets of blood cultures  were positive for coag-negative staph prompting Infectious Disease consultation. 2-D echocardiogram was completed and showed no evidence of vegetation. He was subsequently transferred out of the ICU to stepdown level and [facility] Team 1 assumed care of the patient on 06/13/2012.  Assessment/Plan:  TTP (thrombotic thrombocytopenic purpura) /  Acute post-hemorrhagic anemia -tx course being directed by Hematology - Dr. Gaylyn Rong -[id number] was borderline low but possibly inaccurate due to receipt of FFP,etc prior to this lab being obtained -cont to treat underlying cuases such as sepsis  Sepsis with metabolic encephalopathy / Septic shock -resolved -wean and dc stress dose steroids 6/8 -staph bacteremia - source unclear    Atrial fibrillation with RVR -ongoing care as per Cardiology  -HR better at rest but still bumps up to 120's with exertion -Cardiology has transitioned him off of Multaq and onto Cardizem -will need to reconsider anticoagulation after consultation w/ Heme  Hypokalemia -cont to replace and recheck in AM  -check urinary excretion of K+ initially by checking random urine Na+, K+, Cr, chloride and pH -may be due to plasmaphoresis   ARF (acute renal failure) / Metabolic acidosis -presumed due to hypotension, continued use of ACE I, and thiazide diuretic -HD done to remove Pradaxa and renal fnx has returned baseline  Acute respiratory failure due to encephalopathy -resolved  Bacteremia due to coagulase-negative Staphylococcus -2 of 2 blood cx positive 5/30 -f/u blood cx 1/1 negative 6/1 -TTE negative for vegetation -ID suggests TEE when felt stable - will discuss further w/ Cards as numbers improve     Hyperglycemia -check HgbA1c  Diarrhea - present at admit  -continues/bilious -pt recalls abrupt onset without blood, N/V or abdominal pain -now resolved  ? OSA (obstructive sleep apnea) -using nocturnal CPAP since admit  Mold isolated from sputum  -awaiting identification  - holding empiric treatment as per ID recommendation  MRSA screen + Contact isolation    DVT prophylaxis: SCDs Code Status: Full Family Communication: Patient Disposition Plan: Transfer to 6700  Consultants: Nephrology Hematology ID Cardiology  Procedures: L Arpin TLC 5/30>>>  R a-line 5/30>>>6/4  ET tube 5/31>>>6/4  L femoral trialysis catheter 5/30>>>  CULTURES:  Blood 5/30>>>Coag neg staph  Urine 5/30>>>neg  Sputum 5/30>>>Mold  Antibiotics: Vanc 5/30>>>6/4  Zosyn 5/30>>> 6/1  Doxycycline 5/31>>>6/5 Ancef 6/4>>>   HPI/Subjective: The patient is alert and interactive.  He is tolerating his diet without difficulty.  He reports he feels much better in general.  He has no specific complaints at this time.  Objective: Blood pressure 147/94, pulse 109, temperature 98.1 F (36.7 C), temperature source Oral, resp. rate 12, height 5\' 11"  (1.803 m), weight 135 kg (297 lb 9.9 oz), SpO2 95.00%.  Intake/Output Summary (Last 24 hours) at 06/16/12 1048 Last data filed at 06/16/12 0755  Gross per 24 hour  Intake      0 ml  Output   1050 ml  Net  -1050 ml    Exam: General: No acute respiratory distress at rest Lungs: Clear to auscultation bilaterally without wheezes or crackles, RA Cardiovascular: Irregular rate and rhythm without murmur gallop or rub normal S1 and S2; improving peripheral edema with weeping  Abdomen: Nontender, nondistended, soft, bowel sounds positive, no rebound, no ascites, no appreciable mass Musculoskeletal: No significant cyanosis, clubbing of bilateral lower extremities Neurological: Alert and oriented x 3, moves all extremities x 4 without focal neurological deficits, CN 2-12 intact  Scheduled Meds: Scheduled Meds: . anticoagulant sodium citrate  5 mL Intracatheter Once  .  ceFAZolin (ANCEF) IV  2 g Intravenous Q8H  . [START ON 06/17/2012] diltiazem  240 mg Oral Q0600  . hydrALAZINE  25 mg Oral Q6H  . metoprolol tartrate  100 mg Oral BID  .  pantoprazole  40 mg Oral Daily  . potassium chloride  40 mEq Oral Once  . potassium chloride  40 mEq Oral Once   Data Reviewed: Basic Metabolic Panel:  Recent Labs Lab 06/10/12 0330  06/10/12 1400 06/11/12 0448  06/12/12 0440 06/12/12 2000 06/13/12 0420 06/14/12 0400 06/15/12 0855 06/16/12 0535  NA 153*  < > 153* 156*  < > 148* 141 142 144 142 141  K 2.9*  < > 3.3* 3.6  < > 2.9* 3.6 3.6 3.3* 2.9* 2.9*  CL 103  < > 104 107  < > 101 99 99 100 101 102  CO2 42*  --  41* 40*  < > 41* 35* 36* 36* 34* 31  GLUCOSE 172*  < > 226* 210*  < > 123* 159* 119* 150* 105* 86  BUN 75*  < > 66* 63*  < > 45* 41* 39* 36* 33* 28*  CREATININE 2.20*  < > 1.84* 1.54*  < > 1.23 1.19 1.19 1.17 1.21 1.21  CALCIUM 9.3  --  9.5 9.3  < > 9.1 8.6 8.6 8.7 8.1* 8.2*  MG 1.7  --   --  1.8  --  1.5 2.1 1.9  --   --   --   PHOS 3.0  --  3.0 2.6  --  3.4  --  3.2  --   --   --   < > = values in this interval not displayed. Liver Function Tests:  Recent Labs Lab 06/10/12 1400 06/14/12 0400 06/15/12 0855  AST  --  24 25  ALT  --  24 18  ALKPHOS  --  60 61  BILITOT  --  0.9 0.7  PROT  --  5.5* 5.6*  ALBUMIN 2.9* 2.9* 2.8*   CBC:  Recent Labs Lab 06/12/12 0440 06/13/12 0420 06/14/12 0400 06/15/12 0855 06/16/12 0535  WBC 6.1 6.7 4.8 5.6 5.2  HGB 8.4* 8.7* 8.4* 8.5* 8.2*  HCT 26.7* 27.2* 25.5* 26.1* 25.0*  MCV 87.8 85.8 85.3 85.9 85.3  PLT 108* 117* 115* 126* 116*   CBG:  Recent Labs Lab 06/13/12 0104 06/13/12 0353 06/13/12 0800 06/13/12 1145 06/14/12 0021  GLUCAP 116* 129* 102* 127* 156*    Recent Results (from the past 240 hour(s))  CULTURE, BLOOD (ROUTINE X 2)     Status: None   Collection Time    06/06/12 11:30 AM      Result Value Range Status   Specimen Description BLOOD RIGHT ANTECUBITAL   Final   Special Requests BOTTLES DRAWN AEROBIC AND ANAEROBIC 10CC   Final   Culture  Setup Time 06/06/2012 18:25   Final   Culture     Final   Value: STAPHYLOCOCCUS SPECIES (COAGULASE  NEGATIVE)     Note: SUSCEPTIBILITIES PERFORMED ON PREVIOUS CULTURE WITHIN THE LAST 5 DAYS.     Note: Gram Stain Report Called to,Read Back By and Verified With: SABO [facility] 06/07/12 @ 12:45PM BY RUSCA.   Report Status 06/10/2012 FINAL   Final  CULTURE, BLOOD (ROUTINE X 2)     Status: None   Collection Time    06/06/12 11:45 AM      Result Value Range Status   Specimen Description BLOOD RIGHT ANTECUBITAL   Final   Special Requests BOTTLES DRAWN AEROBIC ONLY 5CC   Final   Culture  Setup Time 06/06/2012 18:25   Final   Culture     Final   Value: STAPHYLOCOCCUS SPECIES (COAGULASE NEGATIVE)     Note: RIFAMPIN AND GENTAMICIN SHOULD NOT BE USED AS SINGLE DRUGS FOR TREATMENT OF STAPH INFECTIONS.     Note: Gram Stain Report Called to,Read Back By and Verified With: KATRICE KELLER 06/07/12 @ 5:38PM BY RUSCA.   Report Status 06/10/2012 FINAL   Final   Organism ID, Bacteria STAPHYLOCOCCUS SPECIES (COAGULASE NEGATIVE)   Final  URINE CULTURE     Status: None   Collection Time    06/06/12 11:59 AM      Result Value Range Status   Specimen Description URINE, CATHETERIZED   Final   Special Requests NONE   Final   Culture  Setup Time 06/06/2012 19:34   Final   Colony Count NO GROWTH   Final   Culture NO GROWTH   Final   Report Status 06/07/2012 FINAL   Final  MRSA PCR SCREENING     Status: Abnormal   Collection Time    06/06/12  2:01 PM      Result Value Range Status   MRSA by PCR POSITIVE (*) NEGATIVE Final   Comment:            The GeneXpert MRSA Assay (FDA     approved for NASAL specimens     only), is one component of a     comprehensive MRSA colonization     surveillance program. It is not     intended to diagnose MRSA     infection nor to guide or     monitor treatment for     MRSA infections.  RESULT CALLED TO, READ BACK BY AND VERIFIED WITH:     R. FOUNTAIN RN 16:45 06/06/12 ([email or web address])  URINE CULTURE     Status: None   Collection Time    06/06/12  4:59 PM      Result Value Range  Status   Specimen Description URINE, CATHETERIZED   Final   Special Requests Normal   Final   Culture  Setup Time 06/06/2012 18:12   Final   Colony Count NO GROWTH   Final   Culture NO GROWTH   Final   Report Status 06/07/2012 FINAL   Final  CULTURE, RESPIRATORY (NON-EXPECTORATED)     Status: None   Collection Time    06/07/12 11:44 AM      Result Value Range Status   Specimen Description TRACHEAL ASPIRATE   Final   Special Requests NONE   Final   Gram Stain PENDING   Incomplete   Culture FEW Fungus isolated-identification to follow   Final   Report Status PENDING   Incomplete  STOOL CULTURE     Status: None   Collection Time    06/07/12  3:41 PM      Result Value Range Status   Specimen Description STOOL   Final   Special Requests Normal   Final   Culture     Final   Value: NO SALMONELLA, SHIGELLA, CAMPYLOBACTER, YERSINIA, OR E.COLI 0157:H7 ISOLATED     Note: REDUCED NORMAL FLORA PRESENT   Report Status 06/11/2012 FINAL   Final  CLOSTRIDIUM DIFFICILE BY PCR     Status: None   Collection Time    06/07/12  3:41 PM      Result Value Range Status   C difficile by pcr NEGATIVE  NEGATIVE Final  CULTURE, BLOOD (ROUTINE X 2)     Status: None   Collection Time    06/08/12  6:31 PM      Result Value Range Status   Specimen Description BLOOD LEFT ARM   Final   Special Requests BOTTLES DRAWN AEROBIC ONLY 6CC   Final   Culture  Setup Time 06/09/2012 02:06   Final   Culture NO GROWTH 5 DAYS   Final   Report Status 06/15/2012 FINAL   Final     Studies:  Recent x-ray studies have been reviewed in detail by the Attending Physician  Scheduled Meds:  Reviewed in detail by the Attending Physician   Junious Silk, ANP [facility] Office  [phone] Pager [phone]  **If unable to reach the above provider after paging please contact the Flow Manager @ [phone]  On-Call/Text Page:      [email or web address]      password [email or web address]  If 7PM-7AM, please contact  night-coverage [email or web address] Password [email or web address] 06/16/2012, 10:48 AM   LOS: 10 days   I have personally examined this patient and reviewed the entire database. I have reviewed the above note, made any necessary editorial changes, and agree with its content.  Lonia Blood, MD [facility]

## 2012-06-16 NOTE — Progress Notes (Signed)
[facility]  TEAM 1 - Stepdown/ICU TEAM  Called to hemodialysis unit emergently.  Arrived to find the patient being intubated by anesthesia and attended to by the code team.  Report from code team was that patient was finishing up plasmapheresis at which time he was observed to become unconscious and found to be in PEA arrest.  CODE BLUE was called.  CPR was administered.  Reportedly the patient regained a spontaneous pulse after the second amp of epinephrine was administered.  At the time of my exam the patient's airway has been secured by anesthesia, a blood pressure is registered at 140 systolic, heart rate is sinus tachycardia at approximately 140 beats per minute, and oxygen saturations are at 100%.   Basic metabolic panel ordered during the code has not yet returned.  I am suspicious that hypokalemia could have contributed.  Upon speaking with his hemodialysis nurse she informs me that the patient began to complain of itching just prior to his arrest.  This occurred at the same time that she initiated the last unit of blood products for his plasmapheresis.  She reported that the last unit was B+ whereas the remainder of the patient's blood products had all been O.  I contacted critical care to inform them of the patient's intubation.  They will assume care of the patient.  I escorted the patient to unit 2900.  Lonia Blood, MD [facility] Office  [phone] Pager [phone]  On-Call/Text Page:      [email or web address]      password [facility]

## 2012-06-16 NOTE — Progress Notes (Signed)
CODE BLUE NOTE  Patient Name: Brett [name]   MRN: [id number]   Date of Birth/ Sex: 1945-09-13 , male      Admission Date: 06/06/2012  Attending Provider: Lonia Blood, MD  Primary Diagnosis: TTP (thrombotic thrombocytopenic purpura)    Indication: Pt was in his usual state of health receiving plasmapheresis until this PM, when he was noted to be unresponsive without a pulse. Code blue was subsequently called. At the time of arrival on scene, ACLS protocol was underway.    Technical Description:  - CPR performance duration:  4  minutes  - Was defibrillation or cardioversion used? No   - Was external pacer placed? No  - Was patient intubated pre/post CPR? Yes    Medications Administered: Y = Yes; Blank = No Amiodarone  Y  Atropine    Calcium    Epinephrine  Y  Lidocaine    Magnesium    Norepinephrine    Phenylephrine    Sodium bicarbonate  Y  Vasopressin      Post CPR evaluation:  - Final Status - Was patient successfully resuscitated ? Yes - What is current rhythm? SVT - What is current hemodynamic status? stable   Miscellaneous Information:  - Labs sent, including: BMET  - Primary team notified?  Yes  - Family Notified? Attempted to contact  - Additional notes/ transfer status: Primary team (Dr. Sharon Seller) arrived and took over care     Physicians Responding to Code Blue: Christen Bame, Internal Medicine PGY-1 Levert Feinstein, MD, Family Medicine PGY-1  Lorretta Harp, Internal Medicine PGY-2   Latrelle Dodrill, MD  06/16/2012, 6:23 PM

## 2012-06-16 NOTE — Progress Notes (Signed)
Report called to [facility] on 6700.

## 2012-06-16 NOTE — Progress Notes (Signed)
ANTIBIOTIC CONSULT NOTE - FOLLOW UP  Pharmacy Consult for Ancef Indication: MSSE bacteremia  No Known Allergies  Patient Measurements: Height: 5\' 11"  (180.3 cm) Weight: 297 lb 9.9 oz (135 kg) IBW/kg (Calculated) : 75.3 Adjusted Body Weight:   Vital Signs: Temp: 98.1 F (36.7 C) (06/09 0753) Temp src: Oral (06/09 0753) BP: 147/94 mmHg (06/09 0753) Pulse Rate: 109 (06/09 0753) Intake/Output from previous day: 06/08 0701 - 06/09 0700 In: -  Out: 1250 [Urine:1250] Intake/Output from this shift: Total I/O In: -  Out: 200 [Urine:200]  Labs:  Recent Labs  06/14/12 0400 06/15/12 0855 06/16/12 0535  WBC 4.8 5.6 5.2  HGB 8.4* 8.5* 8.2*  PLT 115* 126* 116*  CREATININE 1.17 1.21 1.21   Estimated Creatinine Clearance: 83.1 ml/min (by C-G formula based on Cr of 1.21). No results found for this basename: VANCOTROUGH, Leodis Binet, VANCORANDOM, GENTTROUGH, GENTPEAK, GENTRANDOM, TOBRATROUGH, TOBRAPEAK, TOBRARND, AMIKACINPEAK, AMIKACINTROU, AMIKACIN,  in the last 72 hours   Microbiology: Recent Results (from the past 720 hour(s))  CULTURE, BLOOD (ROUTINE X 2)     Status: None   Collection Time    06/06/12 11:30 AM      Result Value Range Status   Specimen Description BLOOD RIGHT ANTECUBITAL   Final   Special Requests BOTTLES DRAWN AEROBIC AND ANAEROBIC 10CC   Final   Culture  Setup Time 06/06/2012 18:25   Final   Culture     Final   Value: STAPHYLOCOCCUS SPECIES (COAGULASE NEGATIVE)     Note: SUSCEPTIBILITIES PERFORMED ON PREVIOUS CULTURE WITHIN THE LAST 5 DAYS.     Note: Gram Stain Report Called to,Read Back By and Verified With: SABO [facility] 06/07/12 @ 12:45PM BY RUSCA.   Report Status 06/10/2012 FINAL   Final  CULTURE, BLOOD (ROUTINE X 2)     Status: None   Collection Time    06/06/12 11:45 AM      Result Value Range Status   Specimen Description BLOOD RIGHT ANTECUBITAL   Final   Special Requests BOTTLES DRAWN AEROBIC ONLY 5CC   Final   Culture  Setup Time 06/06/2012 18:25    Final   Culture     Final   Value: STAPHYLOCOCCUS SPECIES (COAGULASE NEGATIVE)     Note: RIFAMPIN AND GENTAMICIN SHOULD NOT BE USED AS SINGLE DRUGS FOR TREATMENT OF STAPH INFECTIONS.     Note: Gram Stain Report Called to,Read Back By and Verified With: KATRICE KELLER 06/07/12 @ 5:38PM BY RUSCA.   Report Status 06/10/2012 FINAL   Final   Organism ID, Bacteria STAPHYLOCOCCUS SPECIES (COAGULASE NEGATIVE)   Final  URINE CULTURE     Status: None   Collection Time    06/06/12 11:59 AM      Result Value Range Status   Specimen Description URINE, CATHETERIZED   Final   Special Requests NONE   Final   Culture  Setup Time 06/06/2012 19:34   Final   Colony Count NO GROWTH   Final   Culture NO GROWTH   Final   Report Status 06/07/2012 FINAL   Final  MRSA PCR SCREENING     Status: Abnormal   Collection Time    06/06/12  2:01 PM      Result Value Range Status   MRSA by PCR POSITIVE (*) NEGATIVE Final   Comment:            The GeneXpert MRSA Assay (FDA     approved for NASAL specimens     only), is one component of  a     comprehensive MRSA colonization     surveillance program. It is not     intended to diagnose MRSA     infection nor to guide or     monitor treatment for     MRSA infections.     RESULT CALLED TO, READ BACK BY AND VERIFIED WITH:     R. FOUNTAIN RN 16:45 06/06/12 ([email or web address])  URINE CULTURE     Status: None   Collection Time    06/06/12  4:59 PM      Result Value Range Status   Specimen Description URINE, CATHETERIZED   Final   Special Requests Normal   Final   Culture  Setup Time 06/06/2012 18:12   Final   Colony Count NO GROWTH   Final   Culture NO GROWTH   Final   Report Status 06/07/2012 FINAL   Final  CULTURE, RESPIRATORY (NON-EXPECTORATED)     Status: None   Collection Time    06/07/12 11:44 AM      Result Value Range Status   Specimen Description TRACHEAL ASPIRATE   Final   Special Requests NONE   Final   Gram Stain PENDING   Incomplete   Culture FEW Fungus  isolated-identification to follow   Final   Report Status PENDING   Incomplete  STOOL CULTURE     Status: None   Collection Time    06/07/12  3:41 PM      Result Value Range Status   Specimen Description STOOL   Final   Special Requests Normal   Final   Culture     Final   Value: NO SALMONELLA, SHIGELLA, CAMPYLOBACTER, YERSINIA, OR E.COLI 0157:H7 ISOLATED     Note: REDUCED NORMAL FLORA PRESENT   Report Status 06/11/2012 FINAL   Final  CLOSTRIDIUM DIFFICILE BY PCR     Status: None   Collection Time    06/07/12  3:41 PM      Result Value Range Status   C difficile by pcr NEGATIVE  NEGATIVE Final  CULTURE, BLOOD (ROUTINE X 2)     Status: None   Collection Time    06/08/12  6:31 PM      Result Value Range Status   Specimen Description BLOOD LEFT ARM   Final   Special Requests BOTTLES DRAWN AEROBIC ONLY [facility]   Final   Culture  Setup Time 06/09/2012 02:06   Final   Culture NO GROWTH 5 DAYS   Final   Report Status 06/15/2012 FINAL   Final    Anti-infectives   Start     Dose/Rate Route Frequency Ordered Stop   06/11/12 1500  ceFAZolin (ANCEF) IVPB 2 g/50 mL premix     2 g 100 mL/hr over 30 Minutes Intravenous 3 times per day 06/11/12 1437     06/10/12 1200  vancomycin (VANCOCIN) 1,750 mg in sodium chloride 0.9 % 500 mL IVPB  Status:  Discontinued     1,750 mg 250 mL/hr over 120 Minutes Intravenous Every 24 hours 06/10/12 0837 06/11/12 1350   06/09/12 1200  vancomycin (VANCOCIN) 1,750 mg in sodium chloride 0.9 % 500 mL IVPB  Status:  Discontinued     1,750 mg 250 mL/hr over 120 Minutes Intravenous Every 48 hours 06/07/12 1152 06/10/12 0837   06/08/12 1200  vancomycin (VANCOCIN) 1,750 mg in sodium chloride 0.9 % 500 mL IVPB  Status:  Discontinued     1,750 mg 250 mL/hr over 120 Minutes Intravenous Every 48 hours 06/06/12  1347 06/07/12 1152   06/07/12 2200  doxycycline (VIBRAMYCIN) 100 mg in dextrose 5 % 250 mL IVPB  Status:  Discontinued     100 mg 125 mL/hr over 120 Minutes  Intravenous Every 12 hours 06/07/12 2122 06/12/12 1341   06/07/12 1200  vancomycin (VANCOCIN) IVPB 1000 mg/200 mL premix     1,000 mg 200 mL/hr over 60 Minutes Intravenous  Once 06/07/12 1152 06/07/12 1329   06/06/12 1800  piperacillin-tazobactam (ZOSYN) IVPB 2.25 g  Status:  Discontinued     2.25 g 100 mL/hr over 30 Minutes Intravenous 4 times per day 06/06/12 1347 06/09/12 1011   06/06/12 1330  piperacillin-tazobactam (ZOSYN) IVPB 3.375 g  Status:  Discontinued     3.375 g 100 mL/hr over 30 Minutes Intravenous  Once 06/06/12 1320 06/06/12 1325   06/06/12 1330  vancomycin (VANCOCIN) IVPB 1000 mg/200 mL premix  Status:  Discontinued     1,000 mg 200 mL/hr over 60 Minutes Intravenous  Once 06/06/12 1320 06/06/12 1325   06/06/12 1200  piperacillin-tazobactam (ZOSYN) IVPB 3.375 g     3.375 g 12.5 mL/hr over 240 Minutes Intravenous  Once 06/06/12 1151 06/06/12 1600   06/06/12 1200  vancomycin (VANCOCIN) IVPB 1000 mg/200 mL premix  Status:  Discontinued     1,000 mg 200 mL/hr over 60 Minutes Intravenous  Once 06/06/12 1151 06/06/12 1158   06/06/12 1200  vancomycin (VANCOCIN) 2,000 mg in sodium chloride 0.9 % 500 mL IVPB     2,000 mg 250 mL/hr over 120 Minutes Intravenous STAT 06/06/12 1158 06/06/12 1429      Assessment: 66yom on Ancef Day 6 for MSSE bacteremia (patient was on Vancomycin 5/30 >>6/4). Patient is currently afebrile and WBC wnl. Repeat blood culture from 6/1 negative. ID narrowed therapy to Ancef and recommended to treat for a minimum of 14 days (through 6/14). SCr stable, est. crcl ~ 60- 70 ml/min  Plan:  1. Continue Ancef 2g IV q8h  2. Follow-up renal function, UOP, clinical course and LOT  Bayard Hugger, PharmD, BCPS  Clinical Pharmacist  Pager: [phone]   06/16/2012,11:21 AM

## 2012-06-16 NOTE — Progress Notes (Signed)
Chaplain responded to Code [facility] page to Hemodialysis. Nurse called pt's daughter and left phone message. Pulse was regained and in due time pt was moved to 2914. Pt had come to HD from 6703. I checked with RN there as to whether pt had any belongings. There was a black bag (CPAP machine) belonging to pt. I offered to take it to 2914 and RN agreed. I accompanied pt to 2914, gave CPAP device to nurse, and left word to page me if needed.

## 2012-06-17 DIAGNOSIS — I82403 Acute embolism and thrombosis of unspecified deep veins of lower extremity, bilateral: Secondary | ICD-10-CM

## 2012-06-17 DIAGNOSIS — R7309 Other abnormal glucose: Secondary | ICD-10-CM

## 2012-06-17 DIAGNOSIS — I339 Acute and subacute endocarditis, unspecified: Secondary | ICD-10-CM

## 2012-06-17 DIAGNOSIS — E876 Hypokalemia: Secondary | ICD-10-CM

## 2012-06-17 HISTORY — DX: Acute embolism and thrombosis of unspecified deep veins of lower extremity, bilateral: I82.403

## 2012-06-17 LAB — TROPONIN I
Troponin I: <0.3 ng/mL (ref <0.30)
Troponin I: <0.3 ng/mL (ref <0.30)

## 2012-06-17 LAB — THERAPEUTIC PLASMA EXCHANGE (BLOOD BANK)
Plasma Exchange: 4000
Plasma volume needed: 4000
Unit division: 0
Unit division: 0
Unit division: 0
Unit division: 0
Unit division: 0
Unit division: 0
Unit division: 0

## 2012-06-17 LAB — CULTURE, RESPIRATORY W GRAM STAIN

## 2012-06-17 LAB — POCT I-STAT 3, ART BLOOD GAS (G3+)
Bicarbonate: 31.4 mEq/L — ABNORMAL HIGH (ref 20.0–24.0)
TCO2: 33 mmol/L (ref 0–100)
pH, Arterial: 7.521 — ABNORMAL HIGH (ref 7.350–7.450)

## 2012-06-17 LAB — CBC
HCT: 26.7 % — ABNORMAL LOW (ref 39.0–52.0)
Hemoglobin: 8.9 g/dL — ABNORMAL LOW (ref 13.0–17.0)
MCV: 86.4 fL (ref 78.0–100.0)
Platelets: 107 10*3/uL — ABNORMAL LOW (ref 150–400)
RBC: 3.09 MIL/uL — ABNORMAL LOW (ref 4.22–5.81)
WBC: 13.4 10*3/uL — ABNORMAL HIGH (ref 4.0–10.5)

## 2012-06-17 LAB — LACTIC ACID, PLASMA
Lactic Acid, Venous: 1.9 mmol/L (ref 0.5–2.2)
Lactic Acid, Venous: 1.3 mmol/L (ref 0.5–2.2)

## 2012-06-17 LAB — RENAL FUNCTION PANEL
Albumin: 2.4 g/dL — ABNORMAL LOW (ref 3.5–5.2)
BUN: 32 mg/dL — ABNORMAL HIGH (ref 6–23)
Calcium: 8 mg/dL — ABNORMAL LOW (ref 8.4–10.5)
Creatinine, Ser: 1.4 mg/dL — ABNORMAL HIGH (ref 0.50–1.35)
Glucose, Bld: 119 mg/dL — ABNORMAL HIGH (ref 70–99)
Phosphorus: 3.9 mg/dL (ref 2.3–4.6)

## 2012-06-17 LAB — HEPARIN LEVEL (UNFRACTIONATED): Heparin Unfractionated: 0.16 IU/mL — ABNORMAL LOW (ref 0.30–0.70)

## 2012-06-17 LAB — GLUCOSE, CAPILLARY

## 2012-06-17 MED ORDER — POTASSIUM CHLORIDE 20 MEQ/15ML (10%) PO LIQD
40.0000 meq | Freq: Once | ORAL | Status: AC
  Filled 2012-06-17: qty 30

## 2012-06-17 MED ORDER — FUROSEMIDE 10 MG/ML IJ SOLN
80.0000 mg | Freq: Once | INTRAMUSCULAR | Status: AC
  Administered 2012-06-17: 80 mg via INTRAVENOUS
  Filled 2012-06-17: qty 8

## 2012-06-17 MED ORDER — POTASSIUM CHLORIDE CRYS ER 20 MEQ PO TBCR
EXTENDED_RELEASE_TABLET | ORAL | Status: AC
  Administered 2012-06-17: 40 meq
  Filled 2012-06-17: qty 2

## 2012-06-17 MED ORDER — HEPARIN BOLUS VIA INFUSION
4000.0000 [IU] | Freq: Once | INTRAVENOUS | Status: AC
  Administered 2012-06-17: 4000 [IU] via INTRAVENOUS
  Filled 2012-06-17: qty 4000

## 2012-06-17 MED ORDER — MIDAZOLAM HCL 2 MG/2ML IJ SOLN
2.0000 mg | Freq: Once | INTRAMUSCULAR | Status: AC
  Administered 2012-06-17: 2 mg via INTRAVENOUS

## 2012-06-17 MED ORDER — FENTANYL CITRATE 0.05 MG/ML IJ SOLN
50.0000 ug | Freq: Once | INTRAMUSCULAR | Status: AC
  Administered 2012-06-17: 50 ug via INTRAVENOUS

## 2012-06-17 MED ORDER — POTASSIUM CHLORIDE 20 MEQ/15ML (10%) PO LIQD
ORAL | Status: AC
  Administered 2012-06-17: 40 meq
  Filled 2012-06-17: qty 30

## 2012-06-17 MED ORDER — CHLORHEXIDINE GLUCONATE 0.12 % MT SOLN
15.0000 mL | Freq: Two times a day (BID) | OROMUCOSAL | Status: DC
  Administered 2012-06-17 – 2012-06-19 (×4): 15 mL via OROMUCOSAL
  Filled 2012-06-17 (×7): qty 15

## 2012-06-17 MED ORDER — FENTANYL CITRATE 0.05 MG/ML IJ SOLN
25.0000 ug | Freq: Once | INTRAMUSCULAR | Status: AC
  Administered 2012-06-17: 25 ug via INTRAVENOUS

## 2012-06-17 MED ORDER — MIDAZOLAM HCL 2 MG/2ML IJ SOLN
1.0000 mg | Freq: Once | INTRAMUSCULAR | Status: AC
  Administered 2012-06-17: 1 mg via INTRAVENOUS

## 2012-06-17 MED ORDER — HEPARIN (PORCINE) IN NACL 100-0.45 UNIT/ML-% IJ SOLN
1750.0000 [IU]/h | INTRAMUSCULAR | Status: DC
  Administered 2012-06-17: 1400 [IU]/h via INTRAVENOUS
  Administered 2012-06-18: 1750 [IU]/h via INTRAVENOUS
  Filled 2012-06-17 (×5): qty 250

## 2012-06-17 MED ORDER — SODIUM CHLORIDE 0.9 % IV SOLN: INTRAVENOUS | Status: DC

## 2012-06-17 MED ORDER — ACETAMINOPHEN 325 MG PO TABS
650.0000 mg | ORAL_TABLET | Freq: Four times a day (QID) | ORAL | Status: DC | PRN
  Administered 2012-06-17 – 2012-06-18 (×2): 650 mg via ORAL
  Filled 2012-06-17 (×2): qty 2

## 2012-06-17 NOTE — Progress Notes (Signed)
Pharmacy consult for Heparin  Indication: DVT  Heparin level = 0.16 on 1400 units/hr Goal heparin level= 0.3-0.7  Heparin level is subtherapeutic. Will increase the rate to 1750 units/hr F/U heparin level  With moring labs.  Cardell Peach, Pharm D

## 2012-06-17 NOTE — Progress Notes (Signed)
Pt's daughter, Chosen Geske called and consented of for pt's upcoming TEE procedure; telephone/verbal consent witnessed by Dr. Royann Shivers & N. Dalbert Garnet, Charity fundraiser.  Daughter notified of approximate procedure time of 11:00am 06/17/12.  Paul Half, RN

## 2012-06-17 NOTE — Progress Notes (Signed)
ANTICOAGULATION CONSULT NOTE - Initial Consult  Pharmacy Consult for Heparin  Indication: DVT  No Known Allergies  Patient Measurements: Height: 5\' 11"  (180.3 cm) Weight: 297 lb 9.9 oz (135 kg) IBW/kg (Calculated) : 75.3 Heparin Dosing Weight: ~110 kg  Vital Signs: Temp: 98.4 F (36.9 C) (06/10 1200) Temp src: Oral (06/10 1200) BP: 130/85 mmHg (06/10 1300) Pulse Rate: 102 (06/10 0925)  Labs:  Recent Labs  06/15/12 0855 06/16/12 0535 06/16/12 1810 06/16/12 2027 06/17/12 0227 06/17/12 0803  HGB 8.5* 8.2*  --   --  8.9*  --   HCT 26.1* 25.0*  --   --  26.7*  --   PLT 126* 116*  --   --  107*  --   CREATININE 1.21 1.21 1.22  --  1.40*  --   TROPONINI  --   --   --  <0.30 <0.30 <0.30    Estimated Creatinine Clearance: 71.8 ml/min (by C-G formula based on Cr of 1.4).   Medical History: Past Medical History  Diagnosis Date  . Persistent atrial fibrillation 02/2011    Negative Myoview; Normal EF by Echo  . Obesity (BMI 30-39.9)   . Hypertension   . Dyslipidemia     Medications:  Scheduled:  .  ceFAZolin (ANCEF) IV  2 g Intravenous Q8H  . chlorhexidine  15 mL Mouth/Throat BID  . diltiazem  60 mg Oral Q6H  . ferrous sulfate  325 mg Oral BID WC  . heparin  4,000 Units Intravenous Once  . hydrALAZINE  25 mg Oral Q6H  . metoprolol tartrate  100 mg Oral BID  . pantoprazole (PROTONIX) IV  40 mg Intravenous Q24H  . potassium chloride  40 mEq Per Tube Once    Assessment: 67 yo male with MSSA bacteremia, questionable TTP, now found with CVT in common femoral and posterior tibial veins.  Pharmacy asked to begin IV heparin.  Discussed with Dr. Sung Amabile (who consulted Hematology), OK to begin heparin, will monitor closely for recurrence of bleeding or worsening of thrombocytopenia.  Per Dr. Westley Hummer, okay to give bolus dose of heparin as needed.  Goal of Therapy:  Heparin level 0.3-0.7 units/ml Monitor platelets by anticoagulation protocol: Yes   Plan:  1. Start IV  heparin with bolus of 4000 units x 1 2. Then start heparin gtt at 1400 units/hr. 3. Check heparin level 6 hrs after gtt starts. 4. Daily heparin level and CBC. 5. Monitor for signs or symptoms of bleeding.  Tad Moore, BCPS  Clinical Pharmacist Pager [phone]  06/17/2012 1:29 PM

## 2012-06-17 NOTE — Progress Notes (Signed)
PULMONARY  / CRITICAL CARE MEDICINE  Name: Brett [name] MRN: [id number] DOB: Mar 12, 1945    ADMISSION DATE:  06/06/2012 CONSULTATION DATE:    REFERRING MD :  TRH PRIMARY SERVICE:  PCCM  CHIEF COMPLAINT:  S/p PEA cardiac arrest  BRIEF PATIENT DESCRIPTION: 73 to with TTP who suffered PEA cardia arrest during plasmapheresis, was briefly resuscitated with ROSC and transferred to 2900 unit for further management.  PCCM was consulted.  SIGNIFICANT EVENTS / STUDIES:  5/30 TTE >>> Normal EF, NO vegetation 6/9   ? PEA cardiac arrest, intubated, transferred to 2900 6/10 TEE - No vegetations seen 6/10 LE venous Dopplers - bilat DVTs noted  LINES / TUBES: ETT 6/9 >> 6/10 L fem DH cath 5/30 >> 6/10 L IJ CVL 5/30 >>    CULTURES: 5/30  Blood >>> Coag-negative Staph 5/30  Urine >>> NEG 6/1    Blood >>> NEG  ANTIBIOTICS: Vanc 5/30 >>> 6/4  Zosyn 5/30 >>> 6/1  Doxycycline 5/31>>> 6/5  Ancef 6/4 >>   INTERVAL HISTORY: RASS 0. Passed SBT. Extubated and looks good  VITAL SIGNS: Temp:  [97.6 F (36.4 C)-98.9 F (37.2 C)] 98.4 F (36.9 C) (06/10 1200) Pulse Rate:  [31-115] 102 (06/10 0925) Resp:  [0-34] 31 (06/10 1600) BP: (87-199)/(51-156) 181/103 mmHg (06/10 1600) SpO2:  [99 %-100 %] 100 % (06/10 1600) FiO2 (%):  [40 %-100 %] 40 % (06/10 1220)  HEMODYNAMICS:   VENTILATOR SETTINGS: Vent Mode:  [-] CPAP;PSV FiO2 (%):  [40 %-100 %] 40 % Set Rate:  [14 bmp-16 bmp] 14 bmp Vt Set:  [550 mL] 550 mL PEEP:  [5 cmH20] 5 cmH20 Pressure Support:  [5 cmH20] 5 cmH20 Plateau Pressure:  [10 cmH20-17 cmH20] 17 cmH20  INTAKE / OUTPUT: Intake/Output     06/09 0701 - 06/10 0700 06/10 0701 - 06/11 0700   P.O.  400   I.V. (mL/kg) 1118.3 (8.3) 517.3 (3.8)   NG/GT 150 100   IV Piggyback 150    Total Intake(mL/kg) 1418.3 (10.5) 1017.3 (7.5)   Urine (mL/kg/hr) 925 (0.3) 2175 (1.7)   Total Output 925 2175   Net +493.3 -1157.7         PHYSICAL EXAMINATION: General:  NAD, + F/C Neuro:  No focal deficits HEENT: WNL Cardiovascular: RRR s M Lungs: clear Abdomen:  Soft, nontender, bowel sounds diminished Musculoskeletal:  Moves all extremities, no edema Skin:  Intact  LABS:  Recent Labs Lab 06/12/12 0440 06/12/12 2000 06/13/12 0420  06/14/12 0400 06/15/12 0855 06/16/12 0535 06/16/12 1810 06/16/12 2027 06/16/12 2208 06/17/12 0227 06/17/12 0418 06/17/12 0753 06/17/12 0803  HGB 8.4*  --  8.7*  < > 8.4* 8.5* 8.2*  --   --   --  8.9*  --   --   --   WBC 6.1  --  6.7  --  4.8 5.6 5.2  --   --   --  13.4*  --   --   --   PLT 108*  --  117*  --  115* 126* 116*  --   --   --  107*  --   --   --   NA 148* 141 142  < > 144 142 141 144  --   --  141  --   --   --   K 2.9* 3.6 3.6  < > 3.3* 2.9* 2.9* 3.6  --   --  3.7  --   --   --   CL  101 99 99  < > 100 101 102 102  --   --  104  --   --   --   CO2 41* 35* 36*  --  36* 34* 31 30  --   --  30  --   --   --   GLUCOSE 123* 159* 119*  < > 150* 105* 86 179*  --   --  119*  --   --   --   BUN 45* 41* 39*  < > 36* 33* 28* 27*  --   --  32*  --   --   --   CREATININE 1.23 1.19 1.19  < > 1.17 1.21 1.21 1.22  --   --  1.40*  --   --   --   CALCIUM 9.1 8.6 8.6  --  8.7 8.1* 8.2* 8.0*  --   --  8.0*  --   --   --   MG 1.5 2.1 1.9  --   --   --   --   --  1.6  --   --   --   --   --   PHOS 3.4  --  3.2  --   --   --   --   --   --   --  3.9  --   --   --   AST  --   --   --   --  24 25  --   --   --   --   --   --   --   --   ALT  --   --   --   --  24 18  --   --   --   --   --   --   --   --   ALKPHOS  --   --   --   --  60 61  --   --   --   --   --   --   --   --   BILITOT  --   --   --   --  0.9 0.7  --   --   --   --   --   --   --   --   PROT  --   --   --   --  5.5* 5.6*  --   --   --   --   --   --   --   --   ALBUMIN  --   --   --   --  2.9* 2.8*  --   --   --   --  2.4*  --   --   --   INR 1.17  --   --   --   --   --   --   --   --   --   --   --   --   --   LATICACIDVEN  --   --   --   --   --   --   --   --  3.9*  --   1.9  --  1.3  --   TROPONINI  --   --   --   --   --   --   --   --  <0.30  --  <0.30  --   --  <0.30  PHART  --   --   --   --   --   --   --   --   --  7.572*  --  7.521*  --   --   PCO2ART  --   --   --   --   --   --   --   --   --  34.1*  --  38.3  --   --   PO2ART  --   --   --   --   --   --   --   --   --  317.0*  --  104.0*  --   --   < > = values in this interval not displayed.  Recent Labs Lab 06/13/12 1145 06/14/12 0021 06/16/12 2054 06/17/12 0026 06/17/12 0455  GLUCAP 127* 156* 155* 149* 108*   CXR:  No new film  ASSESSMENT / PLAN:  PULMONARY A:  Acute respiratory failure in setting of ? cardiac arrest.   OSA on nocturnal CPAP. P:   Monitor post extubation   CARDIOVASCULAR A: ?PEA arrest during Plasmapheresis, suspected transfusion reaction.  Doubt massive PE as RV reportedly not dilated on Echo 6/10.   Septic shock secondary to Staph bacteremia upon admission - resolved. P:  Cards following Cont current rs  RENAL A:  Hypokalemia, resolved P:   Monitor Correct electrolytes as indicated   GASTROINTESTINAL A:  No active issues. P:   Resume diet if no problems post extubation   HEMATOLOGIC A:  Suspected TTP.   Anemia. Bilateral DVTs 6/10 P:  Discussed with Dr Gaylyn Rong No further plasmapharesis Full dose UFH per Pharm  INFECTIOUS A:  Staph bacteremia, source is unclear. P:   Cultures and antibiotics as above Complete 14 days total of anti-staph therapy   ENDOCRINE  A:  Transient hyperglycemia P:   No intervention required  NEUROLOGIC A:  Encephalopathy post cardiac arrest, resolved P:   Minimize sedatives   I have personally obtained a history, examined the patient, evaluated laboratory and imaging results, formulated the assessment and plan and placed orders.  CRITICAL CARE:  The patient is critically ill with multiple organ systems failure and requires high complexity decision making for assessment and support, frequent evaluation and  titration of therapies, application of advanced monitoring technologies and extensive interpretation of multiple databases. Critical Care Time devoted to patient care services described in this note is 35 minutes.   Billy Fischer, MD Pulmonary and Critical Care Medicine [facility] Pager: [phone]  06/17/2012, 4:17 PM

## 2012-06-17 NOTE — Progress Notes (Signed)
Bilateral lower extremity venous duplex completed.  Right:  DVT noted in the common femoral and posterior tibial veins.  No evidence of superficial thrombosis.  No Baker's cyst.  Left: DVT noted common femoral vein.  No evidence of superficial thrombosis.  No Baker's cyst.

## 2012-06-17 NOTE — Progress Notes (Signed)
NUTRITION FOLLOW UP  Intervention:   1. If enteral nutrition is warranted, recommend initiate Vital AF 1.2 @ 20 ml/hr and advance by 10 ml q 4 hr to a goal rate of 35 ml/hr with 60 ml Pro-stat TID. This EN regimen would provide 1608 kcal, 156 gm protein, and 681 ml free water.   Nutrition Dx:   Inadequate oral intake now related to inability to eat as evidenced by mechanical ventilation.   Goal:   Enteral nutrition to provide 60-70% of estimated calorie needs (11-14 kcals/kg actual body weight) and 100% of estimated protein needs, based on ASPEN guidelines for permissive underfeeding in critically ill obese individuals.   Monitor:   Vent status, initiation of enteral nutrition, weight trends, labs   Assessment:   Pt was receiving plasmapheresis on 2022-06-28 and coded, pulse regained and pt transferred to 2900. Code possibly related to transfusion? Noted DVT in the left common femoral and posterior tibial veins and right common femoral vein per notes.   Prior to reintubation pt was eating well, though no meals documented.   Recommend if pt to remain intubated, start enteral nutrition. Pt has OG tube in place with tip in the distal stomach.   Height: Ht Readings from Last 1 Encounters:  06/06/12 5\' 11"  (1.803 m)    Weight Status:   Wt Readings from Last 1 Encounters:  2012-06-27 297 lb 9.9 oz (135 kg)  Admission weight : 279 lbs   Body mass index is 41.53 kg/(m^2).   Patient is currently intubated on ventilator support.  MV: 8 Temp:Temp (24hrs), Avg:98.5 F (36.9 C), Min:97.4 F (36.3 C), Max:98.9 F (37.2 C)  Propofol: none   Re-estimated needs:  Kcal: 2055 Protein: >/= 156 gm  Fluid: per team   Skin: intact  Diet Order:   NPO    Intake/Output Summary (Last 24 hours) at 06/17/12 1121 Last data filed at 06/17/12 1000  Gross per 24 hour  Intake 1818.33 ml  Output    580 ml  Net 1238.33 ml  +7.6 L for this admission   Last BM: 6/7    Labs:   Recent Labs Lab  06/12/12 0440 06/12/12 2000 06/13/12 0420  2012/06/27 0535 June 27, 2012 1810 06/27/2012 2027 06/17/12 0227  NA 148* 141 142  < > 141 144  --  141  K 2.9* 3.6 3.6  < > 2.9* 3.6  --  3.7  CL 101 99 99  < > 102 102  --  104  CO2 41* 35* 36*  < > 31 30  --  30  BUN 45* 41* 39*  < > 28* 27*  --  32*  CREATININE 1.23 1.19 1.19  < > 1.21 1.22  --  1.40*  CALCIUM 9.1 8.6 8.6  < > 8.2* 8.0*  --  8.0*  MG 1.5 2.1 1.9  --   --   --  1.6  --   PHOS 3.4  --  3.2  --   --   --   --  3.9  GLUCOSE 123* 159* 119*  < > 86 179*  --  119*  < > = values in this interval not displayed.  CBG (last 3)   Recent Labs  06/27/2012 2054 06/17/12 0026 06/17/12 0455  GLUCAP 155* 149* 108*    Scheduled Meds: .  ceFAZolin (ANCEF) IV  2 g Intravenous Q8H  . chlorhexidine  15 mL Mouth/Throat BID  . diltiazem  60 mg Oral Q6H  . ferrous sulfate  325  mg Oral BID WC  . hydrALAZINE  25 mg Oral Q6H  . metoprolol tartrate  100 mg Oral BID  . pantoprazole (PROTONIX) IV  40 mg Intravenous Q24H    Continuous Infusions: . sodium chloride 1,000 mL (06/17/12 0700)    Clarene Duke RD, LDN Pager [phone] After Hours pager [phone]

## 2012-06-17 NOTE — Progress Notes (Signed)
Pt placed on SBT by MD. Will extubate pt shortly. RT Will monitor.

## 2012-06-17 NOTE — Consult Note (Signed)
[facility] INPATIENT PROGRESS NOTE  Name: Brett [name]      MRN: [id number]    Location: 2914/2914-01  Date: 06/17/2012 Time:2:31 PM   Subjective: Interval History:Brett [name] had PEA arrest yesterday while he was about to finish plasma pharesis.  I discussed with dialysis staff who performed the procedure yesterday.  He developed rapid cardiopulmonary arrest when she started a B-positive plasma sample.  He has seen been intubated but has been able to node and shake his head.  He denied obvious symptoms.  There were no bleeding symptoms.   Objective: Vital signs in last 24 hours: Temp:  [97.4 F (36.3 C)-98.9 F (37.2 C)] 98.4 F (36.9 C) (06/10 1200) Pulse Rate:  [31-115] 102 (06/10 0925) Resp:  [0-34] 24 (06/10 1300) BP: (87-199)/(51-156) 130/85 mmHg (06/10 1300) SpO2:  [98 %-100 %] 100 % (06/10 1300) FiO2 (%):  [40 %-100 %] 40 % (06/10 1220)     PHYSICAL EXAM:  General: Obese man.  Eyes: no scleral icterus. ENT:  Intubated.  There was no blood in his nose. Respiratory: lungs showed mild bibasilar crackles.  Cardiovascular: Regular rate and rhythm, S1/S2, without murmur, rub or gallop. There was 2+pedal edema with venous stasis changes and weeping discharge . GI: abdomen was soft, flat, nontender, nondistended, without organomegaly.   Studies/Results: Results for orders placed during the hospital encounter of 06/06/12 (from the past 48 hour(s))  CBC     Status: Abnormal   Collection Time    06/16/12  5:35 AM      Result Value Range   WBC 5.2  4.0 - 10.5 K/uL   RBC 2.93 (*) 4.22 - 5.81 MIL/uL   Hemoglobin 8.2 (*) 13.0 - 17.0 g/dL   HCT 84.6 (*) 96.2 - 95.2 %   MCV 85.3  78.0 - 100.0 fL   MCH 28.0  26.0 - 34.0 pg   MCHC 32.8  30.0 - 36.0 g/dL   RDW 84.1 (*) 32.4 - 40.1 %   Platelets 116 (*) 150 - 400 K/uL   Comment: CONSISTENT WITH PREVIOUS RESULT  BASIC METABOLIC PANEL     Status: Abnormal   Collection Time    06/16/12  5:35 AM      Result Value  Range   Sodium 141  135 - 145 mEq/L   Potassium 2.9 (*) 3.5 - 5.1 mEq/L   Chloride 102  96 - 112 mEq/L   CO2 31  19 - 32 mEq/L   Glucose, Bld 86  70 - 99 mg/dL   BUN 28 (*) 6 - 23 mg/dL   Creatinine, Ser 0.27  0.50 - 1.35 mg/dL   Calcium 8.2 (*) 8.4 - 10.5 mg/dL   GFR calc non Af Amer 60 (*) >90 mL/min   GFR calc Af Amer 70 (*) >90 mL/min   Comment:            The eGFR has been calculated     using the CKD EPI equation.     This calculation has not been     validated in all clinical     situations.     eGFR's persistently     <90 mL/min signify     possible Chronic Kidney Disease.  THERAPEUTIC PLASMA EXCHANGE     Status: None   Collection Time    06/16/12 11:56 AM      Result Value Range   Plasma Exchange 4000 ML THAWED     Plasma volume needed 4000  Unit Number [id number]     Blood Component Type THAWED PLASMA     Unit division 00     Status of Unit ISSUED,FINAL     Transfusion Status OK TO TRANSFUSE     Unit Number [id number]     Blood Component Type THAWED PLASMA     Unit division 00     Status of Unit ISSUED,FINAL     Transfusion Status OK TO TRANSFUSE     Unit Number [id number]     Blood Component Type THAWED PLASMA     Unit division 00     Status of Unit ISSUED,FINAL     Transfusion Status OK TO TRANSFUSE     Unit Number [id number]     Blood Component Type THAWED PLASMA     Unit division 00     Status of Unit ISSUED,FINAL     Transfusion Status OK TO TRANSFUSE     Unit Number [id number]     Blood Component Type THAWED PLASMA     Unit division 00     Status of Unit ISSUED,FINAL     Transfusion Status OK TO TRANSFUSE     Unit Number [id number]     Blood Component Type THAWED PLASMA     Unit division 00     Status of Unit ISSUED,FINAL     Transfusion Status OK TO TRANSFUSE     Unit Number [id number]     Blood Component Type THAWED PLASMA     Unit division 00     Status of Unit ISSUED,FINAL     Transfusion Status OK TO TRANSFUSE      Unit Number [id number]     Blood Component Type THAWED PLASMA     Unit division 00     Status of Unit ISSUED,FINAL     Transfusion Status OK TO TRANSFUSE     Unit Number [id number]     Blood Component Type THAWED PLASMA     Unit division 00     Status of Unit ISSUED,FINAL     Transfusion Status OK TO TRANSFUSE     Unit Number [id number]     Blood Component Type THAWED PLASMA     Unit division 00     Status of Unit ISSUED,FINAL     Transfusion Status OK TO TRANSFUSE     Unit Number [id number]     Blood Component Type THAWED PLASMA     Unit division 00     Status of Unit ISSUED,FINAL     Transfusion Status OK TO TRANSFUSE     Unit Number [id number]     Blood Component Type FP24, THAWED     Unit division 00     Status of Unit ISSUED,FINAL     Transfusion Status OK TO TRANSFUSE    NA AND K (SODIUM & POTASSIUM), RAND UR     Status: None   Collection Time    06/16/12  1:10 PM      Result Value Range   Sodium, Ur 74     Potassium Urine Timed 32    CREATININE, URINE, RANDOM     Status: None   Collection Time    06/16/12  1:10 PM      Result Value Range   Creatinine, Urine 83.14    CHLORIDE, URINE, RANDOM     Status: None   Collection Time    06/16/12  1:10 PM      Result Value Range   Chloride Urine 38  BASIC METABOLIC PANEL     Status: Abnormal   Collection Time    06/16/12  6:10 PM      Result Value Range   Sodium 144  135 - 145 mEq/L   Potassium 3.6  3.5 - 5.1 mEq/L   Comment: DELTA CHECK NOTED   Chloride 102  96 - 112 mEq/L   CO2 30  19 - 32 mEq/L   Glucose, Bld 179 (*) 70 - 99 mg/dL   BUN 27 (*) 6 - 23 mg/dL   Creatinine, Ser 0.86  0.50 - 1.35 mg/dL   Calcium 8.0 (*) 8.4 - 10.5 mg/dL   GFR calc non Af Amer 60 (*) >90 mL/min   GFR calc Af Amer 69 (*) >90 mL/min   Comment:            The eGFR has been calculated     using the CKD EPI equation.     This calculation has not been     validated in all clinical     situations.     eGFR's  persistently     <90 mL/min signify     possible Chronic Kidney Disease.  TRANSFUSION REACTION     Status: None   Collection Time    06/16/12  6:45 PM      Result Value Range   Post RXN DAT IgG NEG     DAT C3 NEG     Path interp tx rxn       Value: NO EVIDENCE OF HEMOLYTIC TRANSFUSION REACTION. REVIEWED BY [place] RUND,DO ON 06/17/12.  URINALYSIS, ROUTINE W REFLEX MICROSCOPIC     Status: Abnormal   Collection Time    06/16/12  7:49 PM      Result Value Range   Color, Urine YELLOW  YELLOW   APPearance CLEAR  CLEAR   Specific Gravity, Urine 1.016  1.005 - 1.030   pH 7.0  5.0 - 8.0   Glucose, UA 100 (*) NEGATIVE mg/dL   Hgb urine dipstick SMALL (*) NEGATIVE   Bilirubin Urine NEGATIVE  NEGATIVE   Ketones, ur NEGATIVE  NEGATIVE mg/dL   Protein, ur NEGATIVE  NEGATIVE mg/dL   Urobilinogen, UA 0.2  0.0 - 1.0 mg/dL   Nitrite NEGATIVE  NEGATIVE   Leukocytes, UA NEGATIVE  NEGATIVE  URINALYSIS W MICROSCOPIC + REFLEX CULTURE     Status: Abnormal   Collection Time    06/16/12  7:49 PM      Result Value Range   Color, Urine YELLOW  YELLOW   APPearance CLEAR  CLEAR   Specific Gravity, Urine 1.018  1.005 - 1.030   pH 7.0  5.0 - 8.0   Glucose, UA 100 (*) NEGATIVE mg/dL   Hgb urine dipstick NEGATIVE  NEGATIVE   Bilirubin Urine NEGATIVE  NEGATIVE   Ketones, ur NEGATIVE  NEGATIVE mg/dL   Protein, ur NEGATIVE  NEGATIVE mg/dL   Urobilinogen, UA 0.2  0.0 - 1.0 mg/dL   Nitrite NEGATIVE  NEGATIVE   Leukocytes, UA NEGATIVE  NEGATIVE   RBC / HPF 11-20  <3 RBC/hpf   Urine-Other URINALYSIS PERFORMED ON SUPERNATANT    URINE MICROSCOPIC-ADD ON     Status: None   Collection Time    06/16/12  7:49 PM      Result Value Range   WBC, UA 0-2  <3 WBC/hpf   RBC / HPF 21-50  <3 RBC/hpf  TROPONIN I     Status: None   Collection Time    06/16/12  8:27 PM      Result Value Range   Troponin I <0.30  <0.30 ng/mL   Comment:            Due to the release kinetics of cTnI,     a negative result within the  first hours     of the onset of symptoms does not rule out     myocardial infarction with certainty.     If myocardial infarction is still suspected,     repeat the test at appropriate intervals.  LACTIC ACID, PLASMA     Status: Abnormal   Collection Time    06/16/12  8:27 PM      Result Value Range   Lactic Acid, Venous 3.9 (*) 0.5 - 2.2 mmol/L  MAGNESIUM     Status: None   Collection Time    06/16/12  8:27 PM      Result Value Range   Magnesium 1.6  1.5 - 2.5 mg/dL  GLUCOSE, CAPILLARY     Status: Abnormal   Collection Time    06/16/12  8:54 PM      Result Value Range   Glucose-Capillary 155 (*) 70 - 99 mg/dL   Comment 1 Notify RN     Comment 2 Documented in Chart    POCT I-STAT 3, BLOOD GAS (G3+)     Status: Abnormal   Collection Time    06/16/12 10:08 PM      Result Value Range   pH, Arterial 7.572 (*) 7.350 - 7.450   pCO2 arterial 34.1 (*) 35.0 - 45.0 mmHg   pO2, Arterial 317.0 (*) 80.0 - 100.0 mmHg   Bicarbonate 31.3 (*) 20.0 - 24.0 mEq/L   TCO2 32  0 - 100 mmol/L   O2 Saturation 100.0     Acid-Base Excess 9.0 (*) 0.0 - 2.0 mmol/L   Collection site RADIAL, ALLEN'S TEST ACCEPTABLE     Drawn by Operator     Sample type ARTERIAL    GLUCOSE, CAPILLARY     Status: Abnormal   Collection Time    06/17/12 12:26 AM      Result Value Range   Glucose-Capillary 149 (*) 70 - 99 mg/dL   Comment 1 Notify RN     Comment 2 Documented in Chart    RENAL FUNCTION PANEL     Status: Abnormal   Collection Time    06/17/12  2:27 AM      Result Value Range   Sodium 141  135 - 145 mEq/L   Potassium 3.7  3.5 - 5.1 mEq/L   Chloride 104  96 - 112 mEq/L   CO2 30  19 - 32 mEq/L   Glucose, Bld 119 (*) 70 - 99 mg/dL   BUN 32 (*) 6 - 23 mg/dL   Creatinine, Ser 7.82 (*) 0.50 - 1.35 mg/dL   Calcium 8.0 (*) 8.4 - 10.5 mg/dL   Phosphorus 3.9  2.3 - 4.6 mg/dL   Albumin 2.4 (*) 3.5 - 5.2 g/dL   GFR calc non Af Amer 50 (*) >90 mL/min   GFR calc Af Amer 59 (*) >90 mL/min   Comment:             The eGFR has been calculated     using the CKD EPI equation.     This calculation has not been     validated in all clinical     situations.     eGFR's persistently     <90 mL/min signify  possible Chronic Kidney Disease.  TROPONIN I     Status: None   Collection Time    06/17/12  2:27 AM      Result Value Range   Troponin I <0.30  <0.30 ng/mL   Comment:            Due to the release kinetics of cTnI,     a negative result within the first hours     of the onset of symptoms does not rule out     myocardial infarction with certainty.     If myocardial infarction is still suspected,     repeat the test at appropriate intervals.  LACTIC ACID, PLASMA     Status: None   Collection Time    06/17/12  2:27 AM      Result Value Range   Lactic Acid, Venous 1.9  0.5 - 2.2 mmol/L  CBC     Status: Abnormal   Collection Time    06/17/12  2:27 AM      Result Value Range   WBC 13.4 (*) 4.0 - 10.5 K/uL   RBC 3.09 (*) 4.22 - 5.81 MIL/uL   Hemoglobin 8.9 (*) 13.0 - 17.0 g/dL   HCT 16.1 (*) 09.6 - 04.5 %   MCV 86.4  78.0 - 100.0 fL   MCH 28.8  26.0 - 34.0 pg   MCHC 33.3  30.0 - 36.0 g/dL   RDW 40.9 (*) 81.1 - 91.4 %   Platelets 107 (*) 150 - 400 K/uL   Comment: CONSISTENT WITH PREVIOUS RESULT  POCT I-STAT 3, BLOOD GAS (G3+)     Status: Abnormal   Collection Time    06/17/12  4:18 AM      Result Value Range   pH, Arterial 7.521 (*) 7.350 - 7.450   pCO2 arterial 38.3  35.0 - 45.0 mmHg   pO2, Arterial 104.0 (*) 80.0 - 100.0 mmHg   Bicarbonate 31.4 (*) 20.0 - 24.0 mEq/L   TCO2 33  0 - 100 mmol/L   O2 Saturation 99.0     Acid-Base Excess 8.0 (*) 0.0 - 2.0 mmol/L   Collection site RADIAL, ALLEN'S TEST ACCEPTABLE     Drawn by Operator     Sample type ARTERIAL    GLUCOSE, CAPILLARY     Status: Abnormal   Collection Time    06/17/12  4:55 AM      Result Value Range   Glucose-Capillary 108 (*) 70 - 99 mg/dL   Comment 1 Notify RN     Comment 2 Documented in Chart    LACTIC ACID,  PLASMA     Status: None   Collection Time    06/17/12  7:53 AM      Result Value Range   Lactic Acid, Venous 1.3  0.5 - 2.2 mmol/L  TROPONIN I     Status: None   Collection Time    06/17/12  8:03 AM      Result Value Range   Troponin I <0.30  <0.30 ng/mL   Comment:            Due to the release kinetics of cTnI,     a negative result within the first hours     of the onset of symptoms does not rule out     myocardial infarction with certainty.     If myocardial infarction is still suspected,     repeat the test at appropriate intervals.   Dg Chest [facility] 1 [address]  06/16/2012   *RADIOLOGY REPORT*  Clinical Data: Post intubation  PORTABLE CHEST - 1 VIEW  Comparison: 06/12/2012  Findings: Endotracheal tube at the thoracic inlet.  Lungs are essentially clear.  No focal consolidation. No pleural effusion or pneumothorax.  The heart is normal in size.  Enteric tube coursing below the diaphragm.  Defibrillator pads overlying the chest.  IMPRESSION: Endotracheal tube at the thoracic inlet.   Original Report Authenticated By: Charline Bills, M.D.   Dg Abd Portable 1v  06/16/2012   *RADIOLOGY REPORT*  Clinical Data: Orogastric tube placement.  PORTABLE ABDOMEN - 1 VIEW  Comparison: 06/07/2012  Findings: Gastric decompression tube is coiled in the proximal stomach with the tip located in the region of the distal stomach. Visualized bowel gas pattern is unremarkable.  IMPRESSION: Gastric decompression tube is coiled in the stomach.   Original Report Authenticated By: Irish Lack, M.D.     MEDICATIONS: reviewed.     PROBLEM LIST:  1. Afib; was on Pradaxa  2. Sepsis with blood culture x 2 Staph coag neg; and sputum with fungal species.  3. Acute renal failure from presumably sepsis; now resolved.  4. Fever:  Resolved.  5. Acute anemia:  improved 6. Thrombocytopenia:  improved.  7. Coagulopathy from either sepsis, Pradaxa toxicity, or TTP.  8.  Venous stasis, worsened the last few days with  gravity dependence.   9.  Acute bilateral DVT.  10.  PEA arrest with plasma phoresis on 06/16/12.   RECOMMENDATIONS:   - Continue with empiric antibiotics.  - Will stop any further plasma phoresis at this point.  His anemia and thrombocytopenia have improved compared to admission but no further the last few days.  The diagnosis of TTP was not able to be definitively assigned to his presentation since there were other competing diagnosis:  Pradaxa toxicity, sepsis.  His ADAMTS13 level was non conclusive as it was drawn after FFP transfusion. I recommended no further plasma phoresis at this point.  - With acute bilateral DVT, he could have had a PE causing the PEA.  As his platelet has improved, I recommend proceeding with Heparin IV gtt

## 2012-06-17 NOTE — Progress Notes (Signed)
Subjective: Complaints of CP.  Objective: Vital signs in last 24 hours: Temp:  [97.4 F (36.3 C)-98.9 F (37.2 C)] 98 F (36.7 C) (06/10 0400) Pulse Rate:  [31-115] 90 (06/09 2351) Resp:  [0-34] 14 (06/10 0600) BP: (87-199)/(51-156) 117/75 mmHg (06/10 0600) SpO2:  [95 %-100 %] 100 % (06/10 0600) FiO2 (%):  [40 %-100 %] 40 % (06/10 0600) Last BM Date: 06/14/12  Intake/Output from previous day: 06/09 0701 - 06/10 0700 In: 1318.3 [I.V.:1018.3; NG/GT:150; IV Piggyback:150] Out: 925 [Urine:925] Intake/Output this shift:    Medications Current Facility-Administered Medications  Medication Dose Route Frequency Provider Last Rate Last Dose  . 0.9 %  sodium chloride infusion  1,000 mL Intravenous Continuous Brett Farber, MD 100 mL/hr at 06/16/12 2049 1,000 mL at 06/16/12 2049  . acetaminophen (TYLENOL) suppository 650 mg  650 mg Rectal Q4H PRN Brett Caddy, MD   650 mg at 06/07/12 0007  . ceFAZolin (ANCEF) IVPB 2 g/50 mL premix  2 g Intravenous Q8H Brett [name], RPH   2 g at 06/17/12 8119  . chlorhexidine (PERIDEX) 0.12 % solution 15 mL  15 mL Mouth/Throat BID Brett Reedy, MD      . diltiazem (CARDIZEM) tablet 60 mg  60 mg Oral Q6H Brett Farber, MD   60 mg at 06/17/12 0602  . diphenhydrAMINE (BENADRYL) injection 25 mg  25 mg Intravenous Q6H PRN Brett Farber, MD   25 mg at 06/13/12 1630  . fentaNYL (SUBLIMAZE) injection 25-50 mcg  25-50 mcg Intravenous Q2H PRN Brett Blood, MD   50 mcg at 06/17/12 0447  . ferrous sulfate tablet 325 mg  325 mg Oral BID WC Brett Parody, MD      . hydrALAZINE (APRESOLINE) tablet 25 mg  25 mg Oral Q6H Brett Dar, NP   25 mg at 06/17/12 0602  . metoprolol (LOPRESSOR) tablet 100 mg  100 mg Oral BID Brett Reedy, MD   100 mg at 06/16/12 2220  . midazolam (VERSED) injection 1-2 mg  1-2 mg Intravenous Q2H PRN Brett Blood, MD   2 mg at 06/17/12 1478  . pantoprazole (PROTONIX) injection 40 mg  40 mg  Intravenous Q24H Brett Zubelevitskiy, MD      . pneumococcal 23 valent vaccine (PNU-IMMUNE) injection 0.5 mL  0.5 mL Intramuscular Prior to discharge Brett Cantor, MD        PE: General appearance: alert, cooperative and Intubated Lungs: clear to auscultation bilaterally Heart: irregularly irregular rhythm Abdomen: +BS Extremities: 2+ LEE Pulses: 2+ and symmetric Neurologic: Intubated but responds to commands  Lab Results:   Recent Labs  06/15/12 0855 06/16/12 0535 06/17/12 0227  WBC 5.6 5.2 13.4*  HGB 8.5* 8.2* 8.9*  HCT 26.1* 25.0* 26.7*  PLT 126* 116* 107*   BMET  Recent Labs  06/16/12 0535 06/16/12 1810 06/17/12 0227  NA 141 144 141  K 2.9* 3.6 3.7  CL 102 102 104  CO2 31 30 30   GLUCOSE 86 179* 119*  BUN 28* 27* 32*  CREATININE 1.21 1.22 1.40*  CALCIUM 8.2* 8.0* 8.0*     Assessment/Plan   Principal Problem:   TTP (thrombotic thrombocytopenic purpura) Active Problems:   Sepsis with metabolic encephalopathy   Septic shock   ARF (acute renal failure)   Metabolic acidosis   Hyperglycemia   Diarrhea   Acute respiratory failure due to encpehalopathy   Atrial fibrillation with RVR   Bacteremia due to coagulase-negative Staphylococcus   ? Acute  ischemic colitis   ? OSA (obstructive sleep apnea)   Acute post-hemorrhagic anemia   Hypokalemia   Cardiac arrest  Plan:  SP PEA arrest yesterday evening.  Currently intubated and responding to commands.   Maintaining Afib at a rate in the 90's on PO diltiazem 60mg  q6hr and lopressor 100mg  q12.   K+ WNL.  Hgb mildly improved.     LOS: 11 days    Brett [name], Brett [name] 06/17/2012 7:47 AM  I have seen and examined the patient along with Brett Finlay, PA.  I have reviewed the chart, notes and new data.  I agree with PA's note.  Key new complaints: he is easily arousable Key examination changes: remains in AF with controlled rate and as far as I can tell AF was uninterrupted during last night events Key new findings  / data: electrolytes are better today  PLAN: The cause for his PEA is unclear - may have had profound hypotension due to a reaction to [name] products; note severe hypokalemia at the time. Note absence of signs of myocardial necrosis by ECG/biomarkers. Pulmonary embolism is a consideration. Suspicion for tamponade is low.  I think this is a good opportunity to perform the TEE that Brett [name] had recommended to evaluate for staph bacteriemia/ endocarditis, before he is extubated. We may see hints as to the cause of his arrest as well. Discussed the test, risks and benefits with the patient's daughter, Brett [name], and she gave [place] consent.  Brett Fair, MD, [facility] [phone] 06/17/2012, 8:27 AM

## 2012-06-17 NOTE — Procedures (Signed)
Extubation Procedure Note  Patient Details:   Name: Brett [name] DOB: 10-14-1945 MRN: [id number]   Airway Documentation:     Evaluation  O2 sats: stable throughout and currently acceptable Complications: No apparent complications Patient did tolerate procedure well. Bilateral Breath Sounds: Diminished Suctioning: Airway Yes Pt extubated per MD order and placed on 3L [place]. Vitals remained WNL throughout procedure. No stridor or other complications noted. RT will monitor.   Kristian Covey Arlene 06/17/2012, 1:06 PM

## 2012-06-17 NOTE — Progress Notes (Signed)
May wean pt after TEE this AM per CCM MD. No complications noted at this time. RT Will monitor.

## 2012-06-17 NOTE — Progress Notes (Signed)
MD notified for pt's persistently high HR and increasing BP; awaiting orders

## 2012-06-17 NOTE — Procedures (Signed)
INDICATIONS: MSSE bacteriemia  PROCEDURE:   Informed consent was obtained prior to the procedure. The risks, benefits and alternatives for the procedure were discussed and the patient comprehended these risks.  Risks include, but are not limited to, cough, sore throat, vomiting, nausea, somnolence, esophageal and stomach trauma or perforation, bleeding, low blood pressure, aspiration, pneumonia, infection, trauma to the teeth and death.    After a procedural time-out, the oropharynx was anesthetized with 20% benzocaine spray. The patient was given 3 mg versed and 50 mcg fentanyl for moderate sedation.   The transesophageal probe was inserted in the esophagus and stomach without difficulty and multiple views were obtained.  The patient was kept under observation until the patient left the procedure room.  The patient left the procedure room in stable condition.   Agitated microbubble saline contrast was not administered.  COMPLICATIONS:    There were no immediate complications.  FINDINGS:  No vegetations are seen. Normal left and right ventricular function. No significant valvular abnormalities. LVH. No intracardiac thrombus.  RECOMMENDATIONS:   No evidence for endocarditis.  Time Spent Directly with the Patient:  30 minutes   Brett [name] 06/17/2012, 12:02 PM

## 2012-06-17 NOTE — Progress Notes (Signed)
  Echocardiogram Echocardiogram Transesophageal has been performed.  [place]agu[place] Desautel 06/17/2012, 12:03 PM

## 2012-06-17 NOTE — Progress Notes (Signed)
MD ordered to given 2200 dose of lopressor 1800 due to HR & BP; will continue to monitor closely and update as needed

## 2012-06-18 DIAGNOSIS — D62 Acute posthemorrhagic anemia: Secondary | ICD-10-CM

## 2012-06-18 LAB — CBC
HCT: 25.1 % — ABNORMAL LOW (ref 39.0–52.0)
Hemoglobin: 8.1 g/dL — ABNORMAL LOW (ref 13.0–17.0)
RBC: 2.91 MIL/uL — ABNORMAL LOW (ref 4.22–5.81)
RDW: 16.9 % — ABNORMAL HIGH (ref 11.5–15.5)
WBC: 6.4 10*3/uL (ref 4.0–10.5)

## 2012-06-18 LAB — BASIC METABOLIC PANEL
BUN: 31 mg/dL — ABNORMAL HIGH (ref 6–23)
Chloride: 102 mEq/L (ref 96–112)
GFR calc Af Amer: 68 mL/min — ABNORMAL LOW (ref >90)
Potassium: 3.3 mEq/L — ABNORMAL LOW (ref 3.5–5.1)

## 2012-06-18 MED ORDER — SODIUM CHLORIDE 0.9 % IJ SOLN
10.0000 mL | INTRAMUSCULAR | Status: DC | PRN
  Administered 2012-06-18 – 2012-06-19 (×2): 10 mL

## 2012-06-18 MED ORDER — FERROUS SULFATE 325 (65 FE) MG PO TABS
325.0000 mg | ORAL_TABLET | Freq: Two times a day (BID) | ORAL | Status: DC
  Administered 2012-06-18 – 2012-06-19 (×4): 325 mg via ORAL
  Filled 2012-06-18 (×5): qty 1

## 2012-06-18 MED ORDER — POTASSIUM CHLORIDE CRYS ER 20 MEQ PO TBCR
40.0000 meq | EXTENDED_RELEASE_TABLET | Freq: Three times a day (TID) | ORAL | Status: AC
  Administered 2012-06-18 (×3): 40 meq via ORAL
  Filled 2012-06-18 (×3): qty 2

## 2012-06-18 MED ORDER — FUROSEMIDE 10 MG/ML IJ SOLN
80.0000 mg | Freq: Once | INTRAMUSCULAR | Status: AC
  Administered 2012-06-18: 80 mg via INTRAVENOUS
  Filled 2012-06-18: qty 8

## 2012-06-18 MED ORDER — SODIUM CHLORIDE 0.9 % IJ SOLN: 10.0000 mL | Freq: Two times a day (BID) | INTRAMUSCULAR | Status: DC

## 2012-06-18 MED ORDER — DILTIAZEM HCL ER COATED BEADS 240 MG PO CP24
240.0000 mg | ORAL_CAPSULE | Freq: Every day | ORAL | Status: DC
  Administered 2012-06-18 – 2012-06-19 (×2): 240 mg via ORAL
  Filled 2012-06-18 (×2): qty 1

## 2012-06-18 NOTE — Progress Notes (Addendum)
Note entered in error

## 2012-06-18 NOTE — Progress Notes (Signed)
Report was called to RN on unit [facility]. Pt will transfer to 3 [facility] room 31

## 2012-06-18 NOTE — Progress Notes (Signed)
ANTICOAGULATION CONSULT NOTE - Follow-Up Consult  Pharmacy Consult for Heparin  Indication: DVT  No Known Allergies  Patient Measurements: Height: 5\' 11"  (180.3 cm) Weight: 288 lb (130.636 kg) IBW/kg (Calculated) : 75.3 Heparin Dosing Weight: ~110 kg  Vital Signs: Temp: 98.3 F (36.8 C) (06/11 2059) Temp src: Oral (06/11 2059) BP: 141/89 mmHg (06/11 2059) Pulse Rate: 85 (06/11 2059)  Labs:  Recent Labs  06/16/12 0535 06/16/12 1810 06/16/12 2027 06/17/12 0227 06/17/12 0803 06/17/12 2200 06/18/12 0510 06/18/12 2050  HGB 8.2*  --   --  8.9*  --   --  8.1*  --   HCT 25.0*  --   --  26.7*  --   --  25.1*  --   PLT 116*  --   --  107*  --   --  112*  --   HEPARINUNFRC  --   --   --   --   --  0.16* 0.37 0.36  CREATININE 1.21 1.22  --  1.40*  --   --  1.24  --   TROPONINI  --   --  <0.30 <0.30 <0.30  --   --   --     Estimated Creatinine Clearance: 79.6 ml/min (by C-G formula based on Cr of 1.24).   Medical History: Past Medical History  Diagnosis Date  . Persistent atrial fibrillation 02/2011    Negative Myoview; Normal EF by Echo  . Obesity (BMI 30-39.9)   . Hypertension   . Dyslipidemia     Medications:  Scheduled:  .  ceFAZolin (ANCEF) IV  2 g Intravenous Q8H  . chlorhexidine  15 mL Mouth/Throat BID  . diltiazem  240 mg Oral Daily  . ferrous sulfate  325 mg Oral BID WC  . hydrALAZINE  25 mg Oral Q6H  . metoprolol tartrate  100 mg Oral BID  . potassium chloride  40 mEq Oral TID  . sodium chloride  10-40 mL Intracatheter Q12H   . sodium chloride 20 mL/hr at 06/18/12 0800  . heparin 1,750 Units/hr (06/18/12 2019)     Assessment: 67 yo male with MSSA bacteremia, questionable TTP, now found with CVT in common femoral and posterior tibial veins.  Heparin level remains therapeutic, will not increase rate further d/t risk of bleeding.  Goal of Therapy:  Heparin level 0.3-0.7 units/ml Monitor platelets by anticoagulation protocol: Yes   Plan:  Continue  heparin gtt at 1750 units/hr and f/u in am.  Verlene Mayer, PharmD, BCPS Pager [phone]  06/18/2012 10:10 PM

## 2012-06-18 NOTE — Progress Notes (Signed)
It was passed on to me (in a bedside report) to d/c patient's HD catheter, patient is currently on heparin drip. Called Dr. Molli Knock to clarify and was told to not pull the catheter at this time and that it will need to be addressed again in rounding tomorrow morning.  Seth Bake, RN

## 2012-06-18 NOTE — Progress Notes (Signed)
ANTICOAGULATION CONSULT NOTE - Follow Up Consult  Pharmacy Consult for heparin Indication: DVT  Labs:  Recent Labs  06/15/12 0855 06/16/12 0535 06/16/12 1810 06/16/12 2027 06/17/12 0227 06/17/12 0803 06/17/12 2200 06/18/12 0510  HGB 8.5* 8.2*  --   --  8.9*  --   --   --   HCT 26.1* 25.0*  --   --  26.7*  --   --   --   PLT 126* 116*  --   --  107*  --   --   --   HEPARINUNFRC  --   --   --   --   --   --  0.16* 0.37  CREATININE 1.21 1.21 1.22  --  1.40*  --   --  1.24  TROPONINI  --   --   --  <0.30 <0.30 <0.30  --   --     Assessment/Plan:  67yo male now therapeutic on heparin after rate increase.  Will continue gtt at current rate and confirm stable with additional level.  Vernard Gambles, PharmD, BCPS  06/18/2012,6:35 AM

## 2012-06-18 NOTE — Progress Notes (Signed)
ANTICOAGULATION CONSULT NOTE - Follow-Up Consult  Pharmacy Consult for Heparin  Indication: DVT  No Known Allergies  Patient Measurements: Height: 5\' 11"  (180.3 cm) Weight: 297 lb 9.9 oz (135 kg) IBW/kg (Calculated) : 75.3 Heparin Dosing Weight: ~110 kg  Vital Signs: Temp: 98.2 F (36.8 C) (06/11 1200) Temp src: Oral (06/11 1200) BP: 142/72 mmHg (06/11 1200) Pulse Rate: 101 (06/11 1200)  Labs:  Recent Labs  06/16/12 0535 06/16/12 1810 06/16/12 2027 06/17/12 0227 06/17/12 0803 06/17/12 2200 06/18/12 0510  HGB 8.2*  --   --  8.9*  --   --  8.1*  HCT 25.0*  --   --  26.7*  --   --  25.1*  PLT 116*  --   --  107*  --   --  112*  HEPARINUNFRC  --   --   --   --   --  0.16* 0.37  CREATININE 1.21 1.22  --  1.40*  --   --  1.24  TROPONINI  --   --  <0.30 <0.30 <0.30  --   --     Estimated Creatinine Clearance: 81.1 ml/min (by C-G formula based on Cr of 1.24).   Medical History: Past Medical History  Diagnosis Date  . Persistent atrial fibrillation 02/2011    Negative Myoview; Normal EF by Echo  . Obesity (BMI 30-39.9)   . Hypertension   . Dyslipidemia     Medications:  Scheduled:  .  ceFAZolin (ANCEF) IV  2 g Intravenous Q8H  . chlorhexidine  15 mL Mouth/Throat BID  . diltiazem  240 mg Oral Daily  . ferrous sulfate  325 mg Oral BID WC  . hydrALAZINE  25 mg Oral Q6H  . metoprolol tartrate  100 mg Oral BID  . potassium chloride  40 mEq Oral TID   . sodium chloride 20 mL/hr at 06/18/12 0800  . heparin Stopped (06/18/12 1100)     Assessment: 67 yo male with MSSA bacteremia, questionable TTP, now found with CVT in common femoral and posterior tibial veins.  Pharmacy asked to begin IV heparin 6/10.  Discussed with Dr. Sung Amabile (who consulted Hematology), OK to begin heparin, will monitor closely for recurrence of bleeding or worsening of thrombocytopenia.    Heparin level therapeutic this AM on 1750 units/hr, then heparin held for placement of HD catheter.   Planning to resume heparin at 1 PM.  Goal of Therapy:  Heparin level 0.3-0.7 units/ml Monitor platelets by anticoagulation protocol: Yes   Plan:  1. Restart heparin at 1750 units/hr at 1 PM as ordered. 2. Will check heparin level 8 hrs after gtt starts. 3. Continue daily heparin level and CBC. 4. F/u plans for oral anticoagulation.  Tad Moore, BCPS  Clinical Pharmacist Pager [phone]  06/18/2012 1:13 PM

## 2012-06-18 NOTE — Progress Notes (Signed)
[facility]  Subjective: Currently, only complaint is chest wall soreness from CPR 2 days ago . Otherwise, feels better. Out of bed and in a chair eating breakfast. He is unaware of his a-fib.  Objective: Vital signs in last 24 hours: Temp:  [97.9 F (36.6 C)-98.4 F (36.9 C)] 97.9 F (36.6 C) (06/11 0700) Pulse Rate:  [89-110] 89 (06/11 0700) Resp:  [0-31] 0 (06/11 0700) BP: (107-209)/(55-179) 124/94 mmHg (06/11 0700) SpO2:  [93 %-100 %] 98 % (06/11 0700) FiO2 (%):  [40 %] 40 % (06/10 1220) Last BM Date: 06/17/12  Intake/Output from previous day: 06/10 0701 - 06/11 0700 In: 2143.5 [P.O.:990; I.V.:1053.5; NG/GT:100] Out: 3260 [Urine:3260] Intake/Output this shift:    Medications Current Facility-Administered Medications  Medication Dose Route Frequency Provider Last Rate Last Dose  . 0.9 %  sodium chloride infusion   Intravenous Continuous Merwyn Katos, MD 20 mL/hr at 06/17/12 1550 20 mL/hr at 06/17/12 1550  . acetaminophen (TYLENOL) tablet 650 mg  650 mg Oral Q6H PRN Alyson Reedy, MD   650 mg at 06/17/12 2331  . ceFAZolin (ANCEF) IVPB 2 g/50 mL premix  2 g Intravenous Q8H Gala Lewandowsky Buhl, RPH   2 g at 06/18/12 0502  . chlorhexidine (PERIDEX) 0.12 % solution 15 mL  15 mL Mouth/Throat BID Alyson Reedy, MD   15 mL at 06/17/12 0900  . diltiazem (CARDIZEM) tablet 60 mg  60 mg Oral Q6H Lonia Farber, MD   60 mg at 06/18/12 0502  . ferrous sulfate tablet 325 mg  325 mg Oral BID WC Alyson Reedy, MD      . heparin ADULT infusion 100 units/mL (25000 units/250 mL)  1,750 Units/hr Intravenous Continuous Alyson Reedy, MD 17.5 mL/hr at 06/17/12 2331 1,750 Units/hr at 06/17/12 2331  . hydrALAZINE (APRESOLINE) tablet 25 mg  25 mg Oral Q6H Russella Dar, NP   25 mg at 06/18/12 0502  . metoprolol (LOPRESSOR) tablet 100 mg  100 mg Oral BID Alyson Reedy, MD   100 mg at 06/17/12 1743  . pantoprazole (PROTONIX) injection 40 mg  40 mg  Intravenous Q24H Lonia Farber, MD   40 mg at 06/17/12 0943  . pneumococcal 23 valent vaccine (PNU-IMMUNE) injection 0.5 mL  0.5 mL Intramuscular Prior to discharge Calvert Cantor, MD        PE: General appearance: alert, cooperative and no distress Lungs: clear to auscultation bilaterally Heart: irregularly irregular rhythm Extremities: 2+ bilateral LLE Pulses: 2+ and symmetric Skin: warm and dry Neurologic: Grossly normal  Lab Results:   Recent Labs  06/16/12 0535 06/17/12 0227 06/18/12 0510  WBC 5.2 13.4* 6.4  HGB 8.2* 8.9* 8.1*  HCT 25.0* 26.7* 25.1*  PLT 116* 107* 112*   BMET  Recent Labs  06/16/12 1810 06/17/12 0227 06/18/12 0510  NA 144 141 140  K 3.6 3.7 3.3*  CL 102 104 102  CO2 30 30 32  GLUCOSE 179* 119* 91  BUN 27* 32* 31*  CREATININE 1.22 1.40* 1.24  CALCIUM 8.0* 8.0* 8.3*    Studies/Results:  TEE 06/17/12 FINDINGS:  No vegetations are seen.  Normal left and right ventricular function.  No significant valvular abnormalities.  LVH.  No intracardiac thrombus.  RECOMMENDATIONS:  No evidence for endocarditis.   Assessment/Plan  Principal Problem:   TTP (thrombotic thrombocytopenic purpura) Active Problems:   Sepsis with metabolic encephalopathy   Septic shock   ARF (acute renal failure)   Metabolic acidosis  Hyperglycemia   Diarrhea   Acute respiratory failure due to encpehalopathy   Atrial fibrillation with RVR   Bacteremia due to coagulase-negative Staphylococcus   ? Acute ischemic colitis   ? OSA (obstructive sleep apnea)   Acute post-hemorrhagic anemia   Hypokalemia   Cardiac arrest  Plan: Pt continues to improve. No further bleeding. Plasmapheresis is completed. S/p PEA arrest 2 days ago. Resuscitated with CPR. Still has chest wall soreness. TEE yesterday ruled-out endocarditis. He continues in A-fib. HR is controlled in the upper 80s-low 90s. He is asymptomatic. Continue w/ PO Cardizem, 60 mg Q6H and Lopressor 100 mg  BID. He is on IV heparin. He will need another oral anticoagulant, other than Pradaxa, once his platelet count recovers, for stroke prophylaxis. Will continue to follow.     LOS: 12 days    Brittainy M. Delmer Islam 06/18/2012 8:53 AM  I have seen and evaluated the patient this AM along with Boyce Medici, PA. I agree with her findings, examination as well as impression recommendations.  Remains in Afib with mostly CVR on BB & CCB (dose made daily today).-- no indication that Multaq is related to TTP, will most likely restart once he seems more overall stable.  Would probably opt for a different NOAC agent, although Pradaxa toxicity is not likely the cause, it is not worth the risk -- would consider Xarelto on DVT Rx dose (Eliquis does not yet have the indication for DVT).  Will change Hydralazine to 50 mg bid.  Monitor electrolytes closely - keep K+ > 4  We will follow along, no more Cardio or Pulmonary issues overnight -- anticipate transfer to Tele floor today.  MD Time with pt: 5 min  HARDING,DAVID W, M.D., M.S. [facility] [address] [place], [place]  [address]  [phone] Pager # [phone] 06/18/2012 1:25 PM

## 2012-06-18 NOTE — Progress Notes (Signed)
IV team Rn had difficulty finding second peripheral iv site, spoke to Dr. Sung Amabile he stated to leave left subclavian in place and he will re address tomorrow.

## 2012-06-18 NOTE — Consult Note (Addendum)
WOC re-consult: Consult performed on 6/9; refer to previous progress notes. Pt more critical than previous day with increased amt edema and leaking large amt yellow drainage to left leg.  Wet, macerated skin has evolved into a full thickness stasis ulcer to left posterior leg; 1X1X.3cm, 100% yellow and moist, no odor.  Plan: Aquacel to absorb drainage and provide antimicrobial benefits to promote healing. Please re-consult if further assistance is needed.  Thank-you,  Cammie Mcgee MSN, RN, CWOCN, [place], CNS [phone]

## 2012-06-18 NOTE — Progress Notes (Signed)
PULMONARY  / CRITICAL CARE MEDICINE  Name: Brett [name] MRN: [id number] DOB: 03/17/1945    ADMISSION DATE:  06/06/2012 CONSULTATION DATE:    REFERRING MD :  TRH PRIMARY SERVICE:  PCCM  CHIEF COMPLAINT:  S/p PEA cardiac arrest  BRIEF PATIENT DESCRIPTION: 35 to with TTP who suffered PEA cardia arrest during plasmapheresis, was briefly resuscitated with ROSC and transferred to 2900 unit for further management.  PCCM was consulted.  SIGNIFICANT EVENTS / STUDIES:  5/30 TTE >>> Normal EF, NO vegetation 6/9   ? PEA cardiac arrest, intubated, transferred to 2900 6/10 TEE - No vegetations seen 6/10 LE venous Dopplers - bilat DVTs noted  LINES / TUBES: ETT 6/9 >> 6/10 L fem DH cath 5/30 >> 6/11 L IJ CVL 5/30 >> 6/11 (ordered)   CULTURES: 5/30  Blood >>> Coag-negative Staph 5/30  Urine >>> NEG 6/1    Blood >>> NEG  ANTIBIOTICS: Vanc 5/30 >>> 6/4  Zosyn 5/30 >>> 6/1  Doxycycline 5/31>>> 6/5  Ancef 6/4 >> 6/13    INTERVAL HISTORY: RASS 0. Passed SBT. Extubated and looks good  VITAL SIGNS: Temp:  [97.9 F (36.6 C)-98.3 F (36.8 C)] 98.2 F (36.8 C) (06/11 1200) Pulse Rate:  [89-110] 101 (06/11 1200) Resp:  [0-31] 26 (06/11 1038) BP: (107-209)/(66-179) 142/72 mmHg (06/11 1200) SpO2:  [93 %-100 %] 97 % (06/11 1200)  HEMODYNAMICS:   VENTILATOR SETTINGS:    INTAKE / OUTPUT: Intake/Output     06/10 0701 - 06/11 0700 06/11 0701 - 06/12 0700   P.O. 990 720   I.V. (mL/kg) 1053.5 (7.8) 190 (1.4)   NG/GT 100    IV Piggyback     Total Intake(mL/kg) 2143.5 (15.9) 910 (6.7)   Urine (mL/kg/hr) 3260 (1) 450 (0.5)   Total Output 3260 450   Net -1116.5 +460        Stool Occurrence 1 x     PHYSICAL EXAMINATION: General:  NAD, + F/C Neuro: No focal deficits HEENT: WNL Cardiovascular: RRR s M Lungs: clear Abdomen:  Soft, nontender, bowel sounds diminished Ext: 4+ symmetric edema   LABS:  Recent Labs Lab 06/12/12 0440 06/12/12 2000 06/13/12 0420  06/14/12 0400  06/15/12 0855 06/16/12 0535 06/16/12 1810 06/16/12 2027 06/16/12 2208 06/17/12 0227 06/17/12 0418 06/17/12 0753 06/17/12 0803 06/18/12 0510  HGB 8.4*  --  8.7*  < > 8.4* 8.5* 8.2*  --   --   --  8.9*  --   --   --  8.1*  WBC 6.1  --  6.7  --  4.8 5.6 5.2  --   --   --  13.4*  --   --   --  6.4  PLT 108*  --  117*  --  115* 126* 116*  --   --   --  107*  --   --   --  112*  NA 148* 141 142  < > 144 142 141 144  --   --  141  --   --   --  140  K 2.9* 3.6 3.6  < > 3.3* 2.9* 2.9* 3.6  --   --  3.7  --   --   --  3.3*  CL 101 99 99  < > 100 101 102 102  --   --  104  --   --   --  102  CO2 41* 35* 36*  --  36* 34* 31 30  --   --  30  --   --   --  32  GLUCOSE 123* 159* 119*  < > 150* 105* 86 179*  --   --  119*  --   --   --  91  BUN 45* 41* 39*  < > 36* 33* 28* 27*  --   --  32*  --   --   --  31*  CREATININE 1.23 1.19 1.19  < > 1.17 1.21 1.21 1.22  --   --  1.40*  --   --   --  1.24  CALCIUM 9.1 8.6 8.6  --  8.7 8.1* 8.2* 8.0*  --   --  8.0*  --   --   --  8.3*  MG 1.5 2.1 1.9  --   --   --   --   --  1.6  --   --   --   --   --   --   PHOS 3.4  --  3.2  --   --   --   --   --   --   --  3.9  --   --   --   --   AST  --   --   --   --  24 25  --   --   --   --   --   --   --   --   --   ALT  --   --   --   --  24 18  --   --   --   --   --   --   --   --   --   ALKPHOS  --   --   --   --  60 61  --   --   --   --   --   --   --   --   --   BILITOT  --   --   --   --  0.9 0.7  --   --   --   --   --   --   --   --   --   PROT  --   --   --   --  5.5* 5.6*  --   --   --   --   --   --   --   --   --   ALBUMIN  --   --   --   --  2.9* 2.8*  --   --   --   --  2.4*  --   --   --   --   INR 1.17  --   --   --   --   --   --   --   --   --   --   --   --   --   --   LATICACIDVEN  --   --   --   --   --   --   --   --  3.9*  --  1.9  --  1.3  --   --   TROPONINI  --   --   --   --   --   --   --   --  <0.30  --  <0.30  --   --  <0.30  --   PHART  --   --   --   --   --   --   --   --   --  7.572*  --  7.521*  --   --   --   PCO2ART  --   --   --   --   --   --   --   --   --  34.1*  --  38.3  --   --   --   PO2ART  --   --   --   --   --   --   --   --   --  317.0*  --  104.0*  --   --   --   < > = values in this interval not displayed.  Recent Labs Lab 06/13/12 1145 06/14/12 0021 06/16/12 2054 06/17/12 0026 06/17/12 0455  GLUCAP 127* 156* 155* 149* 108*   CXR:  No new film  ASSESSMENT / PLAN:  PULMONARY A:  Acute respiratory failure in setting of ? cardiac arrest, resolved OSA on nocturnal CPAP. P:   Transfer to med-surg and back to [facility] in AM 6/12   CARDIOVASCULAR A: ?PEA arrest during plasmapheresis, suspected transfusion reaction.   Doubt massive PE as RV reportedly not dilated on Echo 6/10.   Septic shock secondary to Staph bacteremia upon admission - resolved. P:  Cards following Cont current rx  RENAL A:  Hypokalemia Hypervolemia P:   Correct electrolytes as indicated Diuresis   GASTROINTESTINAL A:  No active issues. P:   Cont diet   HEMATOLOGIC A:  Suspected TTP.   Anemia. Bilateral DVTs 6/10 Thrombocytopenia - stable P:  No further plasmapharesis Full dose UFH per Pharm - started 6/10  INFECTIOUS A:  Staph bacteremia, source is unclear. P:   Cultures and antibiotics as above Complete 14 days total of anti-staph therapy  Stop date ordered  ENDOCRINE  A:  Transient hyperglycemia P:   No intervention required  NEUROLOGIC A:  Encephalopathy post cardiac arrest, resolved P:   Minimize sedatives   Transfer to med-surg and back to [facility] in AM 6/12. Discussed with Dr Nichola Sizer, MD Pulmonary and Critical Care Medicine [facility] Pager: [phone]  06/18/2012, 2:11 PM

## 2012-06-19 ENCOUNTER — Inpatient Hospital Stay
Admission: AD | Admit: 2012-06-19 | Discharge: 2012-07-09 | Disposition: A | Discharge status: Home / Self Care | Payer: Self-pay | Source: Ambulatory Visit | Attending: Internal Medicine | Admitting: Internal Medicine

## 2012-06-19 DIAGNOSIS — J9600 Acute respiratory failure, unspecified whether with hypoxia or hypercapnia: Secondary | ICD-10-CM | POA: Diagnosis present

## 2012-06-19 DIAGNOSIS — J9620 Acute and chronic respiratory failure, unspecified whether with hypoxia or hypercapnia: Secondary | ICD-10-CM | POA: Diagnosis not present

## 2012-06-19 DIAGNOSIS — E876 Hypokalemia: Secondary | ICD-10-CM | POA: Diagnosis not present

## 2012-06-19 DIAGNOSIS — M311 Thrombotic microangiopathy, unspecified: Secondary | ICD-10-CM | POA: Diagnosis not present

## 2012-06-19 DIAGNOSIS — A411 Sepsis due to other specified staphylococcus: Secondary | ICD-10-CM | POA: Diagnosis present

## 2012-06-19 DIAGNOSIS — I4891 Unspecified atrial fibrillation: Secondary | ICD-10-CM | POA: Diagnosis not present

## 2012-06-19 DIAGNOSIS — D509 Iron deficiency anemia, unspecified: Secondary | ICD-10-CM | POA: Diagnosis present

## 2012-06-19 DIAGNOSIS — E46 Unspecified protein-calorie malnutrition: Secondary | ICD-10-CM | POA: Diagnosis present

## 2012-06-19 DIAGNOSIS — J90 Pleural effusion, not elsewhere classified: Secondary | ICD-10-CM | POA: Diagnosis not present

## 2012-06-19 DIAGNOSIS — Z86718 Personal history of other venous thrombosis and embolism: Secondary | ICD-10-CM | POA: Diagnosis not present

## 2012-06-19 DIAGNOSIS — I200 Unstable angina: Secondary | ICD-10-CM | POA: Diagnosis present

## 2012-06-19 DIAGNOSIS — Z6841 Body Mass Index (BMI) 40.0 and over, adult: Secondary | ICD-10-CM | POA: Diagnosis not present

## 2012-06-19 DIAGNOSIS — I10 Essential (primary) hypertension: Secondary | ICD-10-CM | POA: Diagnosis present

## 2012-06-19 DIAGNOSIS — M6281 Muscle weakness (generalized): Secondary | ICD-10-CM | POA: Diagnosis not present

## 2012-06-19 DIAGNOSIS — D638 Anemia in other chronic diseases classified elsewhere: Secondary | ICD-10-CM | POA: Diagnosis present

## 2012-06-19 DIAGNOSIS — G4733 Obstructive sleep apnea (adult) (pediatric): Secondary | ICD-10-CM | POA: Diagnosis present

## 2012-06-19 DIAGNOSIS — G9340 Encephalopathy, unspecified: Secondary | ICD-10-CM | POA: Diagnosis not present

## 2012-06-19 DIAGNOSIS — D62 Acute posthemorrhagic anemia: Secondary | ICD-10-CM | POA: Diagnosis not present

## 2012-06-19 DIAGNOSIS — A419 Sepsis, unspecified organism: Secondary | ICD-10-CM | POA: Diagnosis not present

## 2012-06-19 DIAGNOSIS — J9819 Other pulmonary collapse: Secondary | ICD-10-CM | POA: Diagnosis not present

## 2012-06-19 DIAGNOSIS — I5032 Chronic diastolic (congestive) heart failure: Secondary | ICD-10-CM | POA: Diagnosis present

## 2012-06-19 DIAGNOSIS — I872 Venous insufficiency (chronic) (peripheral): Secondary | ICD-10-CM | POA: Diagnosis present

## 2012-06-19 DIAGNOSIS — E669 Obesity, unspecified: Secondary | ICD-10-CM | POA: Diagnosis present

## 2012-06-19 DIAGNOSIS — I509 Heart failure, unspecified: Secondary | ICD-10-CM | POA: Diagnosis not present

## 2012-06-19 DIAGNOSIS — L97209 Non-pressure chronic ulcer of unspecified calf with unspecified severity: Secondary | ICD-10-CM | POA: Diagnosis not present

## 2012-06-19 DIAGNOSIS — R0989 Other specified symptoms and signs involving the circulatory and respiratory systems: Secondary | ICD-10-CM | POA: Diagnosis not present

## 2012-06-19 LAB — CBC
MCV: 86 fL (ref 78.0–100.0)
Platelets: 99 10*3/uL — ABNORMAL LOW (ref 150–400)
RBC: 2.85 MIL/uL — ABNORMAL LOW (ref 4.22–5.81)
WBC: 4.9 10*3/uL (ref 4.0–10.5)

## 2012-06-19 LAB — BASIC METABOLIC PANEL
BUN: 25 mg/dL — ABNORMAL HIGH (ref 6–23)
Chloride: 103 mEq/L (ref 96–112)
GFR calc Af Amer: 75 mL/min — ABNORMAL LOW (ref >90)
Potassium: 4 mEq/L (ref 3.5–5.1)

## 2012-06-19 LAB — HEPARIN LEVEL (UNFRACTIONATED): Heparin Unfractionated: 0.31 IU/mL (ref 0.30–0.70)

## 2012-06-19 MED ORDER — ENOXAPARIN SODIUM 150 MG/ML ~~LOC~~ SOLN
130.0000 mg | Freq: Two times a day (BID) | SUBCUTANEOUS | Status: DC
  Administered 2012-06-19: 130 mg via SUBCUTANEOUS
  Filled 2012-06-19 (×2): qty 1

## 2012-06-19 MED ORDER — HYDRALAZINE HCL 25 MG PO TABS
25.0000 mg | ORAL_TABLET | Freq: Once | ORAL | Status: AC
  Administered 2012-06-19: 25 mg via ORAL
  Filled 2012-06-19: qty 1

## 2012-06-19 MED ORDER — FUROSEMIDE 40 MG PO TABS
40.0000 mg | ORAL_TABLET | Freq: Every day | ORAL | Status: DC
  Administered 2012-06-19: 40 mg via ORAL
  Filled 2012-06-19: qty 1

## 2012-06-19 MED ORDER — FUROSEMIDE 40 MG PO TABS: 40.0000 mg | ORAL_TABLET | Freq: Every day | ORAL | Status: DC

## 2012-06-19 MED ORDER — ENOXAPARIN SODIUM 30 MG/0.3ML ~~LOC~~ SOLN: 100.0000 mg | Freq: Two times a day (BID) | SUBCUTANEOUS | Status: DC

## 2012-06-19 MED ORDER — POTASSIUM CHLORIDE CRYS ER 20 MEQ PO TBCR
20.0000 meq | EXTENDED_RELEASE_TABLET | Freq: Every day | ORAL | Status: DC
  Administered 2012-06-19: 20 meq via ORAL
  Filled 2012-06-19: qty 1

## 2012-06-19 MED ORDER — FERROUS SULFATE 325 (65 FE) MG PO TABS: 325.0000 mg | ORAL_TABLET | Freq: Two times a day (BID) | ORAL | Status: DC

## 2012-06-19 MED ORDER — HYDRALAZINE HCL 50 MG PO TABS
50.0000 mg | ORAL_TABLET | Freq: Two times a day (BID) | ORAL | Status: DC
  Filled 2012-06-19 (×2): qty 1

## 2012-06-19 MED ORDER — CEFAZOLIN SODIUM-DEXTROSE 2-3 GM-% IV SOLR: 2.0000 g | Freq: Three times a day (TID) | INTRAVENOUS | Status: DC

## 2012-06-19 MED ORDER — METOPROLOL TARTRATE 100 MG PO TABS: 100.0000 mg | ORAL_TABLET | Freq: Two times a day (BID) | ORAL | Status: DC

## 2012-06-19 MED ORDER — HYDRALAZINE HCL 50 MG PO TABS: 50.0000 mg | ORAL_TABLET | Freq: Two times a day (BID) | ORAL | Status: DC

## 2012-06-19 MED ORDER — ENOXAPARIN SODIUM 150 MG/ML ~~LOC~~ SOLN: 130.0000 mg | Freq: Two times a day (BID) | SUBCUTANEOUS | Status: DC

## 2012-06-19 MED ORDER — DILTIAZEM HCL ER COATED BEADS 240 MG PO CP24: 240.0000 mg | ORAL_CAPSULE | Freq: Every day | ORAL | Status: DC

## 2012-06-19 MED ORDER — ZOLPIDEM TARTRATE 5 MG PO TABS
5.0000 mg | ORAL_TABLET | Freq: Once | ORAL | Status: AC
  Administered 2012-06-19: 5 mg via ORAL
  Filled 2012-06-19: qty 1

## 2012-06-19 NOTE — Discharge Summary (Signed)
Physician Discharge Summary  Brett [name] LKG:[id number] DOB: 26-Aug-1945 DOA: 06/06/2012  PCP: Brett [name]., MD  Admit date: 06/06/2012 Discharge date: 06/19/2012  Time spent: 75 minutes  Recommendations for Outpatient Follow-up:  1. Needs CBC with differential in about 2-3 days 2. Continue Ancef until 06/21/2012 3. Recommend repeat basic metabolic panel in about 3-5 days 4. Continue Lovenox bridge to Coumadin [not started as yet] until therapeutic  Discharge Diagnoses:  Principal Problem:   TTP (thrombotic thrombocytopenic purpura) Active Problems:   Sepsis with metabolic encephalopathy   Septic shock   ARF (acute renal failure)   Metabolic acidosis   Hyperglycemia   Diarrhea   Acute respiratory failure due to encpehalopathy   Atrial fibrillation with RVR   Bacteremia due to coagulase-negative Staphylococcus   ? Acute ischemic colitis   ? OSA (obstructive sleep apnea)   Acute post-hemorrhagic anemia   Hypokalemia   Cardiac arrest   Discharge Condition: Stable  Diet recommendation: Vitamin K controlled  Filed Weights   06/15/12 0410 06/16/12 0007 06/18/12 1530  Weight: 135 kg (297 lb 9.9 oz) 135 kg (297 lb 9.9 oz) 130.636 kg (288 lb)    History of present illness:  Brief narrative: 67 year old male with hypertension, obesity, OSA, recurrent PAF [Rx Multaq/metorpolol] on pradaxa was admitted 5/30 with shaking chills. 4 days prior he had diarrhea. On admission because of profound hypotension despite previous hydration as well as temporal 102, the base of 12.4, creatinine 11 and at 4.9 platelets 5.1 he was admitted to ICU and started on broad-spectrum vancomycin Zosyn [had Coag neg staph in blodo culture] and placed on pressors norepinephrine-he was able to protect his airway at that time He developed epistaxis mouth bleeding and bleeding from IV lines and subsequently received 4 units of FFP, DDAVP, fever the day after admission, thought either to be secondary to  TTP, ITP, Pradaxa use Patient ultimately was plasmapheresed regularly-which stopped on 6/9,placed on CPAP, ultimately intubated 5/31 Eventually he stabilized and was transferred to that service only to be found to have PEA arrest on hemodialysis requiring CPR Cardiology was consulted due to these events and given initial concern for possible staphylococcal bloodstream infection, TEE was performed 06/17/12 which showed no vegetations Patient was noted however to have lower extremity bilateral DVTs 06/18/38 and was started on unfractionated heparin subsequently transitioned to Coumadin His medications have been adjusted as per below by Cardiology and he has been taken off of Multaq. Patient is in the process currently are being transitioned to long-term acute care hospital where he will get intensive therapies as well as potential rehabilitative therapies as well.  He has chronic skin wounds which will need to be addressed as well and are being dressed with silver supphadiazine  Depending on his course he can potentially be discharged home.  Past medical history-As per Problem list Chart reviewed as below- Seen 5.31.13 for [facility]-dr. [name]   Consultants:  Assumed care from [facility] 06/19/12  Cardiology  Nephrology  ID  Hematology  SIGNIFICANT EVENTS / STUDIES:  5/30 TTE >>> Normal EF, NO vegetation  6/9 ? PEA cardiac arrest, intubated, transferred to 2900  6/10 TEE - No vegetations seen  6/10 LE venous Dopplers - bilat DVTs noted   LINES / TUBES:  ETT 6/9 >> 6/10  L fem DH cath 5/30 >> 6/11  L IJ CVL 5/30 >> 6/11 (ordered)   CULTURES:  5/30 Blood >>> Coag-negative Staph  5/30 Urine >>> NEG  6/1 Blood >>> NEG   ANTIBIOTICS:  Vanc 5/30 >>> 6/4  Zosyn 5/30 >>> 6/1  Doxycycline 5/31>>> 6/5  Ancef 6/4 >> 6/13   Discharge Exam: Filed Vitals:   06/18/12 1727 06/18/12 2059 06/19/12 0552 06/19/12 0943  BP: 138/83 141/89 138/89 150/86  Pulse:  85 64 109  Temp:  98.3 F (36.8 C) 98.6  F (37 C)   TempSrc:  Oral Oral   Resp:  20 20   Height:      Weight:      SpO2:  97% 98%     General: alert pleasant oriented in NAd Cardiovascular:  s1 s2 no m/r/g Respiratory: cta b LE's swollen with grade 2-3 Le pitting edema  Discharge Instructions     Medication List    STOP taking these medications       aspirin EC 81 MG tablet     dabigatran 150 MG Caps  Commonly known as:  PRADAXA     dronedarone 400 MG tablet  Commonly known as:  MULTAQ     lisinopril-hydrochlorothiazide 20-25 MG per tablet  Commonly known as:  PRINZIDE,ZESTORETIC     metoprolol succinate 100 MG 24 hr tablet  Commonly known as:  TOPROL-XL     niacin 500 MG CR tablet  Commonly known as:  NIASPAN      TAKE these medications       ceFAZolin 2-3 GM-% Solr  Commonly known as:  ANCEF  Inject 50 mLs (2 g total) into the vein every 8 (eight) hours.     diltiazem 240 MG 24 hr capsule  Commonly known as:  CARDIZEM CD  Take 1 capsule (240 mg total) by mouth daily.     enoxaparin 150 MG/ML injection  Commonly known as:  LOVENOX  Inject 0.87 mLs (130 mg total) into the skin every 12 (twelve) hours.     ferrous sulfate 325 (65 FE) MG tablet  Take 1 tablet (325 mg total) by mouth 2 (two) times daily with a meal.     fexofenadine 180 MG tablet  Commonly known as:  ALLEGRA  Take 180 mg by mouth daily.     furosemide 40 MG tablet  Commonly known as:  LASIX  Take 1 tablet (40 mg total) by mouth daily.     hydrALAZINE 50 MG tablet  Commonly known as:  APRESOLINE  Take 1 tablet (50 mg total) by mouth 2 (two) times daily.     metoprolol 100 MG tablet  Commonly known as:  LOPRESSOR  Take 1 tablet (100 mg total) by mouth 2 (two) times daily.     mometasone 0.1 % cream  Commonly known as:  ELOCON  Apply 1 application topically as directed. Apply to affected area.     multivitamin with minerals Tabs  Take 1 tablet by mouth daily.     silver sulfADIAZINE 1 % cream  Commonly known as:   SILVADENE  Apply 1 application topically 2 (two) times daily. Apply to both legs once-twice daily. Wrap after application.       No Known Allergies    The results of significant diagnostics from this hospitalization (including imaging, microbiology, ancillary and laboratory) are listed below for reference.    Significant Diagnostic Studies: Ct Abdomen Pelvis Wo Contrast  06/06/2012   *RADIOLOGY REPORT*  Clinical Data: Fever of unknown origin.  Sepsis.  Diarrhea.  CT ABDOMEN AND PELVIS WITHOUT CONTRAST  Technique:  Multidetector CT imaging of the abdomen and pelvis was performed following the standard protocol without intravenous contrast.  Comparison: None.  Findings: Moderate beam hardening artifact is seen throughout the abdomen and pelvis due to the position of the patient's arms.  A Foley catheter is seen within the bladder.  Mildly enlarged prostate gland noted.  There is no evidence of urolithiasis or hydronephrosis.  There is mild asymmetric stranding and possibly a small amount of fluid posterior to the left kidney, which is suboptimally evaluated due to lack of contrast and beam hardening artifact.  This raises possibility of pyelonephritis.  Some motion artifact is also noted.  Mild splenomegaly is seen with spleen measuring approximately 14 cm length.  Noncontrast images of the liver, gallbladder, pancreas, and adrenal glands are normal in appearance.  No soft tissue masses are identified.  No other inflammatory process or abscess identified.  No evidence of dilated bowel loops.  Bone windows show osteolysis and loss of vertebral endplates at L4- 5, consistent with diskitis.  Degenerative disc disease is seen at all other lumbar levels however no other sites of osteolysis identified.  IMPRESSION:  1.  L4-5 diskitis, likely infectious in etiology. Recommend lumbar spine MRI without and with contrast for further evaluation. 2.  Suboptimal evaluation of the abdomen pelvis due to artifact.  Asymmetric density posterior to the left kidney, possibly due to pyelonephritis.  No evidence of urolithiasis or hydronephrosis. 3.  Mild splenomegaly.   Original Report Authenticated By: Myles Rosenthal, M.D.   Ct Head Wo Contrast  06/06/2012   *RADIOLOGY REPORT*  Clinical Data: Altered mental status.  Sepsis.  CT HEAD WITHOUT CONTRAST  Technique:  Contiguous axial images were obtained from the base of the skull through the vertex without contrast.  Comparison: None.  Findings: There is no evidence of intracranial hemorrhage, brain edema or other signs of acute infarction.  There is no evidence of intracranial mass lesion or mass effect.  No abnormal extra-axial fluid collections are identified.  No evidence of hydronephrosis.  No evidence of skull fracture or other bone lesions.  IMPRESSION: No acute intracranial findings.   Original Report Authenticated By: Myles Rosenthal, M.D.   US Renal Port  06/07/2012   *RADIOLOGY REPORT*  Clinical Data:  Acute renal failure.  Hypertension.  RENAL/URINARY TRACT ULTRASOUND COMPLETE  Comparison:  None.  Findings:  Right Kidney:  Enlarged, measuring 14.6 cm in length.  Within normal limits in parenchymal echogenicity.  No evidence of renal mass or hydronephrosis.  Left Kidney:  Enlarged, measuring 15.9 cm length.  Within normal limits in parenchymal echogenicity.  A subcapsular cyst is seen in the posterior mid pole measuring 2.9 cm, which corresponds with the density posterior to the kidney seen on recent CT.  No evidence of solid renal mass or hydronephrosis.  Bladder:  Appears normal for degree of bladder distention. Foley catheter is seen within the bladder.  IMPRESSION:  Mildly enlarged kidneys, without evidence of hydronephrosis.   Original Report Authenticated By: Myles Rosenthal, M.D.   Dg Chest Port 1 View  06/16/2012   *RADIOLOGY REPORT*  Clinical Data: Post intubation  PORTABLE CHEST - 1 VIEW  Comparison: 06/12/2012  Findings: Endotracheal tube at the thoracic inlet.  Lungs  are essentially clear.  No focal consolidation. No pleural effusion or pneumothorax.  The heart is normal in size.  Enteric tube coursing below the diaphragm.  Defibrillator pads overlying the chest.  IMPRESSION: Endotracheal tube at the thoracic inlet.   Original Report Authenticated By: Charline Bills, M.D.   Dg Chest Port 1 View  06/12/2012   *RADIOLOGY REPORT*  Clinical Data: 67 year old male shortness  of breath, acute respiratory failure.  PORTABLE CHEST - 1 VIEW  Comparison: 06/11/2012 and earlier.  Findings: Semi upright AP portable view 0518 hours.  Extubated. Enteric tube removed.  Stable mildly improved lung volumes. Decreased retrocardiac opacity and improved visualization of the left hemidiaphragm.  No pneumothorax.  Mildly increased interstitial markings.  Small right pleural effusion.  Stable left subclavian central line.  IMPRESSION: 1.  Extubated.  Improved retrocardiac ventilation. 2.  Mildly increased interstitial opacity might relate to increased vascular congestion.  Suspect small right effusion.   Original Report Authenticated By: Erskine Speed, M.D.   Dg Chest Port 1 View  06/11/2012   *RADIOLOGY REPORT*  Clinical Data: Evaluate lung fields, short of breath  PORTABLE CHEST - 1 VIEW  Comparison: Prior chest x-ray 06/10/2012  Findings: Stable position of endotracheal tube 3 cm above the carina.  Left subclavian approach central venous catheter with the tip in the mid superior vena cava.  The tip of the nasogastric tube lies below the diaphragm, likely within the stomach.  Slightly decreased interstitial edema.  Persistent pulmonary vascular congestion and cardiomegaly.  Low inspiratory volumes with left greater than right basilar opacities remain unchanged.  IMPRESSION:  1.  Slightly improved pulmonary vascular congestion 2.  Stable cardiomegaly and left greater than right basilar opacities likely reflecting atelectasis. 3.  Stable and satisfactory support apparatus   Original Report  Authenticated By: Malachy Moan, M.D.   Dg Chest Port 1 View  06/10/2012   *RADIOLOGY REPORT*  Clinical Data: Endotracheal tube placement.  PORTABLE CHEST - 1 VIEW  Comparison: 1 day prior  Findings: Endotracheal tube is unchanged in position.  3.9 cm above carina.  Nasogastric extends beyond the  inferior aspect of the film.  Left subclavian line unchanged.  Cardiomegaly accentuated by AP portable technique.  Cannot exclude small left pleural effusion. No pneumothorax.  Low lung volumes with resultant pulmonary interstitial prominence.  No congestive failure.  Probable left base atelectasis.  IMPRESSION: No significant change since one day prior.  Low lung volumes with probable left base atelectasis.  Cannot exclude small left pleural effusion.   Original Report Authenticated By: Jeronimo Greaves, M.D.   Dg Chest Port 1 View  06/09/2012   *RADIOLOGY REPORT*  Clinical Data: Endotracheal tube positioning  PORTABLE CHEST - 1 VIEW  Comparison: Chest radiograph 06/08/2012  Findings: Endotracheal tube is 5.5 cm from carina.  NG tube extends to the stomach.  Left PICC line is unchanged.  Stable enlarged heart silhouette.  There are low lung volumes and mild basilar atelectasis.  No pulmonary edema.  IMPRESSION: 1.  Support apparatus in good position. 2.  Cardiomegaly and low lung volumes.   Original Report Authenticated By: Genevive Bi, M.D.   Dg Chest Port 1 View  06/08/2012   *RADIOLOGY REPORT*  Clinical Data: Intubated  PORTABLE CHEST - 1 VIEW  Comparison:   the previous day's study  Findings: Endotracheal tube has been repositioned, tip now approximately 7 cm above carina.  Nasogastric tube and left subclavian central line are stable in position.  Heart size upper limits normal.  Low lung volumes with some coarse airspace opacities in the left mid lung and both lung bases, slightly increased.  No definite effusion.  IMPRESSION:  1.  Repositioned endotracheal tube with slight increase in asymmetric infiltrates  or atelectasis.   Original Report Authenticated By: D. Andria Rhein, MD   Dg Chest Port 1 View  06/07/2012   *RADIOLOGY REPORT*  Clinical Data: Endotracheal tube placement.  PORTABLE CHEST - 1 VIEW  Comparison: 06/07/2012 at 5:40 hours  Findings: 1120 hours.  Intubation.  Endotracheal tube terminates 3.1 cm above the carina. Nasogastric extends beyond the  inferior aspect of the film.  Mildly degraded exam due to AP portable technique and patient body habitus.  Cardiomegaly accentuated by AP portable technique.  Mild right hemidiaphragm elevation. No pleural effusion or pneumothorax.  Low lung volumes with resultant pulmonary interstitial prominence. Difficult to exclude mild pulmonary venous congestion. No lobar consolidation.  IMPRESSION: Appropriate position of endotracheal tube.  Cardiomegaly and low lung volumes, without definite acute disease.   Original Report Authenticated By: Jeronimo Greaves, M.D.   Dg Chest Port 1 View  06/07/2012   *RADIOLOGY REPORT*  Clinical Data: Respiratory failure  PORTABLE CHEST - 1 VIEW  Comparison: 06/06/2012  Findings: No areas of lung consolidation/infiltrate.  No evidence of pulmonary edema.  No pleural effusion or pneumothorax.  There are prominent bronchovascular markings that are stable.  The cardiac silhouette is normal in size and configuration.  No mediastinal or hilar mass.  Left subclavian central venous line tip is in the mid to lower superior vena cava, stable.  IMPRESSION: No acute findings.  Stable appearance of the chest from the previous day's study, with no evidence of an infiltrate or pulmonary edema.   Original Report Authenticated By: Amie Portland, M.D.   Dg Chest Port 1 View  06/06/2012   *RADIOLOGY REPORT*  Clinical Data: Evaluate central line placement.  PORTABLE CHEST - 1 VIEW  Comparison: 06/06/2012  Findings: Left subclavian central line tip is in the SVC region. There is no evidence for a pneumothorax.  There continues to be slightly low lung  volumes with mild elevation of the right hemidiaphragm.  Heart size is stable.  No focal airspace disease.  IMPRESSION: Central line tip in the SVC region.  No evidence for a pneumothorax.   Original Report Authenticated By: Richarda Overlie, M.D.   Dg Chest Portable 1 View  06/06/2012   *RADIOLOGY REPORT*  Clinical Data: Sepsis.  Emesis.  Diarrhea.  Nausea.  PORTABLE CHEST - 1 VIEW  Comparison: None.  Findings: 2 frontal views.  Moderate right hemidiaphragm elevation. Cardiomegaly accentuated by AP portable technique.  No pleural effusion or pneumothorax.  Low lung volumes with resultant pulmonary interstitial prominence.  Patchy bibasilar atelectasis.  IMPRESSION: Cardiomegaly and low lung volumes with mild bibasilar atelectasis. No acute findings.  If high clinical concern of acute cardiopulmonary disease, consider PA and lateral radiographs.   Original Report Authenticated By: Jeronimo Greaves, M.D.   Dg Abd Portable 1v  06/16/2012   *RADIOLOGY REPORT*  Clinical Data: Orogastric tube placement.  PORTABLE ABDOMEN - 1 VIEW  Comparison: 06/07/2012  Findings: Gastric decompression tube is coiled in the proximal stomach with the tip located in the region of the distal stomach. Visualized bowel gas pattern is unremarkable.  IMPRESSION: Gastric decompression tube is coiled in the stomach.   Original Report Authenticated By: Irish Lack, M.D.   Dg Abd Portable 1v  06/07/2012   *RADIOLOGY REPORT*  Clinical Data: Oral gastric tube placement  PORTABLE ABDOMEN - 1 VIEW  Comparison: Abdomen and pelvis CT, 06/06/2012  Findings: The orogastric tube extends below the diaphragm to curl in the proximal stomach.  There is a normal bowel gas pattern.  The included soft tissues are unremarkable.  Degenerative changes are noted along the spine.  IMPRESSION: Orogastric tube is well positioned in the proximal stomach.   Original Report Authenticated By: Amie Portland, M.D.  Microbiology: No results found for this or any previous  visit (from the past 240 hour(s)).   Labs: Basic Metabolic Panel:  Recent Labs Lab 06/12/12 2000 06/13/12 0420  06/16/12 0535 06/16/12 1810 06/16/12 2027 06/17/12 0227 06/18/12 0510 06/19/12 0515  NA 141 142  < > 141 144  --  141 140 140  K 3.6 3.6  < > 2.9* 3.6  --  3.7 3.3* 4.0  CL 99 99  < > 102 102  --  104 102 103  CO2 35* 36*  < > 31 30  --  30 32 31  GLUCOSE 159* 119*  < > 86 179*  --  119* 91 87  BUN 41* 39*  < > 28* 27*  --  32* 31* 25*  CREATININE 1.19 1.19  < > 1.21 1.22  --  1.40* 1.24 1.14  CALCIUM 8.6 8.6  < > 8.2* 8.0*  --  8.0* 8.3* 8.3*  MG 2.1 1.9  --   --   --  1.6  --   --   --   PHOS  --  3.2  --   --   --   --  3.9  --   --   < > = values in this interval not displayed. Liver Function Tests:  Recent Labs Lab 06/14/12 0400 06/15/12 0855 06/17/12 0227  AST 24 25  --   ALT 24 18  --   ALKPHOS 60 61  --   BILITOT 0.9 0.7  --   PROT 5.5* 5.6*  --   ALBUMIN 2.9* 2.8* 2.4*   No results found for this basename: LIPASE, AMYLASE,  in the last 168 hours No results found for this basename: AMMONIA,  in the last 168 hours CBC:  Recent Labs Lab 06/15/12 0855 06/16/12 0535 06/17/12 0227 06/18/12 0510 06/19/12 0515  WBC 5.6 5.2 13.4* 6.4 4.9  HGB 8.5* 8.2* 8.9* 8.1* 8.0*  HCT 26.1* 25.0* 26.7* 25.1* 24.5*  MCV 85.9 85.3 86.4 86.3 86.0  PLT 126* 116* 107* 112* 99*   Cardiac Enzymes:  Recent Labs Lab 06/16/12 2027 06/17/12 0227 06/17/12 0803  TROPONINI <0.30 <0.30 <0.30   BNP: BNP (last 3 results) No results found for this basename: PROBNP,  in the last 8760 hours CBG:  Recent Labs Lab 06/13/12 1145 06/14/12 0021 06/16/12 2054 06/17/12 0026 06/17/12 0455  GLUCAP 127* 156* 155* 149* 108*       Signed:  Rhetta Mura  [facility] 06/19/2012, 12:33 PM

## 2012-06-19 NOTE — Care Management (Signed)
Pt plan for transfer to [facility] today. RN Irving Burton  to call report to [phone]. Pt will be in room 5742 and the accepting physician will be Dr. Dallas Breeding. No further needs from CM at this time. Gala Lewandowsky , RN, BSN [phone]

## 2012-06-19 NOTE — Progress Notes (Signed)
NUTRITION FOLLOW UP  Intervention:    No nutrition intervention warranted at this time  RD to continue to follow  Nutrition Dx:   Inadequate oral intake, resolved  New Goal:   Oral intake with meals to meet >/= 90% of estimated nutrition needs, met  Monitor:   PO intake, weight, labs, I/O's  Assessment:   Patient extubated 6/10.  Advanced to Heart Healthy diet 6/10.  Reports a good appetite.  PO intake 100% per flowsheets.  Height: Ht Readings from Last 1 Encounters:  06/18/12 5\' 11"  (1.803 m)    Weight Status:   Wt Readings from Last 1 Encounters:  06/18/12 288 lb (130.636 kg)    Re-estimated needs:  Kcal: 2100-2300 Protein: 100-110 gm Fluid: 2.1-2.3 L  Skin: Intact  Diet Order: Cardiac   Intake/Output Summary (Last 24 hours) at 06/19/12 1426 Last data filed at 06/19/12 1315  Gross per 24 hour  Intake   1200 ml  Output   2450 ml  Net  -1250 ml    Last BM: 6/11  Labs:   Recent Labs Lab 06/12/12 2000 06/13/12 0420  06/16/12 2027 06/17/12 0227 06/18/12 0510 06/19/12 0515  NA 141 142  < >  --  141 140 140  K 3.6 3.6  < >  --  3.7 3.3* 4.0  CL 99 99  < >  --  104 102 103  CO2 35* 36*  < >  --  30 32 31  BUN 41* 39*  < >  --  32* 31* 25*  CREATININE 1.19 1.19  < >  --  1.40* 1.24 1.14  CALCIUM 8.6 8.6  < >  --  8.0* 8.3* 8.3*  MG 2.1 1.9  --  1.6  --   --   --   PHOS  --  3.2  --   --  3.9  --   --   GLUCOSE 159* 119*  < >  --  119* 91 87  < > = values in this interval not displayed.  CBG (last 3)   Recent Labs  06/16/12 2054 06/17/12 0026 06/17/12 0455  GLUCAP 155* 149* 108*    Scheduled Meds: .  ceFAZolin (ANCEF) IV  2 g Intravenous Q8H  . chlorhexidine  15 mL Mouth/Throat BID  . diltiazem  240 mg Oral Daily  . enoxaparin (LOVENOX) injection  130 mg Subcutaneous Q12H  . ferrous sulfate  325 mg Oral BID WC  . furosemide  40 mg Oral Daily  . hydrALAZINE  50 mg Oral BID  . metoprolol tartrate  100 mg Oral BID  . potassium chloride   20 mEq Oral Daily  . sodium chloride  10-40 mL Intracatheter Q12H    Continuous Infusions: . sodium chloride 20 mL/hr at 06/18/12 0800    Maureen Chatters, RD, LDN Pager #: [phone] After-Hours Pager #: [phone]

## 2012-06-19 NOTE — Progress Notes (Signed)
ANTIBIOTIC CONSULT NOTE - FOLLOW UP  Pharmacy Consult for Heparin/Ancef Indication: B DVT's and bacteremia  No Known Allergies  Patient Measurements: Height: 5\' 11"  (180.3 cm) Weight: 288 lb (130.636 kg) IBW/kg (Calculated) : 75.3 Adjusted Body Weight: 110 kg  Vital Signs: Temp: 98.6 F (37 C) (06/12 0552) Temp src: Oral (06/12 0552) BP: 138/89 mmHg (06/12 0552) Pulse Rate: 64 (06/12 0552) Intake/Output from previous day: 06/11 0701 - 06/12 0700 In: 1667.5 [P.O.:1440; I.V.:227.5] Out: 2450 [Urine:2450] Intake/Output from this shift:    Labs:  Recent Labs  06/16/12 1310  06/17/12 0227 06/18/12 0510 06/19/12 0515  WBC  --   --  13.4* 6.4 4.9  HGB  --   --  8.9* 8.1* 8.0*  PLT  --   --  107* 112* 99*  LABCREA 83.14  --   --   --   --   CREATININE  --   < > 1.40* 1.24 1.14  < > = values in this interval not displayed. Estimated Creatinine Clearance: 86.6 ml/min (by C-G formula based on Cr of 1.14). No results found for this basename: Rolm Gala, VANCORANDOM, GENTTROUGH, GENTPEAK, GENTRANDOM, TOBRATROUGH, TOBRAPEAK, TOBRARND, AMIKACINPEAK, AMIKACINTROU, AMIKACIN,  in the last 72 hours    Assessment: N/V/D with 3-4 day long hx of generalized weakness 6/9: PEA arrest during plasmapheresis  AC: Afib- Pradaxa PTA (last 5/29), - rec'd HD to aid in removal; CHADS2 = 1.  No plans to resume given TTP vs Pradaxa toxicity - f/u HemOnc plans.  6/10 - new B DVTs, ordered to start heparin - dose w/ caution given recent bleeding. 6/11: HD catheter. Heparin held then resumed post-procedure 6/12. HL 0.3. Hgb stable at 8. Plts down to 99  ID: Ancef for MSSE bacteremia; Doxy stopped 6/5 for recent tick bite; Afeb, WBC 4.9; TTE neg, TEE negative for IE. ID recommends Ancef x 14d  Ancef 6/4 >> (to 6/13 per CCM note) Vanc 5/30 >>6/4 Zosyn 5/30 >> 6/2 Doxy 5/31 >> 6/5  5/30: Blood - MSSE x2 sens to Cl[place], LVQ, oxacillin, tet, Vanc) 5/30: Urine - negative 5/31: Sputum -  few fungus: few trichosporon species. 5/30: MRSA PCR - positive 6/1: Blood x 1: negative  CV: hx Afib (EF WNL), HTN, HLD; Afib, VSS this am Meds: Diltiazem, Hydralazine, metoprolol. No ACEI d/t AKI on admit.   Endo: no prior hx - CBGs <150; off stress steroid  GI / Nutrition:LFTs wnl - on Reg diet  Renal: ARF s/p 1 session of HD 5/31 (to remove Pradaxa); Scr now down to 1.14  Pulm: Last extubated 6/11. OSA on nocturnal CPAP  HemOnc:  Presumed TTP - HemOnc consulted. No further plasmapheresis planned  PTA Meds: asa, dabigatran, allegra, lisinopril/HCTZ, MVI, niaspan  Best Practices: SCDs, MC, IV Ptx  Goal of Therapy:  Heparin level 0.3-0.7  Plan:  Extended abx end date through 6/13 per CCM note Continue Ancef 2g IV q8h,  Continue heparin at 1750 units/hr. Next heparin level in am.    Hershell Brandl S. Merilynn Finland, PharmD, BCPS Clinical Staff Pharmacist Pager [phone]  Misty Stanley Stillinger 06/19/2012,8:21 AM

## 2012-06-19 NOTE — Progress Notes (Signed)
[facility] order. Pt received and reviewed discharge instructions including follow-up appointment information and prescription information. Pt verbalized understanding. Pt alert and oriented at time of discharge with no complaints of pain. Report given to Fleet Contras, RN on 5700. Pt transferred to 5742 via wheelchair by nurse and nurse tech.  All belongings, COBRA form, facesheet, transfer report and discharge summary sent with pt. Efraim Kaufmann

## 2012-06-19 NOTE — Progress Notes (Signed)
Physical Therapy Treatment Patient Details Name: Brett [name] MRN: [id number] DOB: Feb 13, 1945 Today's Date: 06/19/2012 Time: 4098-1191 PT Time Calculation (min): 28 min  PT Assessment / Plan / Recommendation Comments on Treatment Session  Upon reassessment patient demonstrates signficant improvements in functional mobility at this time. Patient tolerated increased ambulation as well as ther ex today.  Patient will benefit from continued skilled PT and Recommend HHPT with supervision intermittant assist upon dischareg.  Patient will also need a rw for discharge. Will continue to see as indicated.    Follow Up Recommendations  Home health PT;Supervision - Intermittent           Equipment Recommendations  Rolling walker with 5" wheels    Recommendations for Other Services    Frequency Min 3X/week   Plan Discharge plan remains appropriate    Precautions / Restrictions Restrictions Weight Bearing Restrictions: No   Pertinent Vitals/Pain 7/10 chest pain from CPR compressions no other pain at this time    Mobility  Bed Mobility Bed Mobility: Supine to Sit;Sitting - Scoot to Edge of Bed Supine to Sit: 4: Min assist Sitting - Scoot to [place] of Bed: 5: Supervision Details for Bed Mobility Assistance: VCs for positioning and sequencing; heavy reliance on rails Transfers Transfers: Sit to Stand;Stand to Sit Sit to Stand: 4: Min guard Stand to Sit: 4: Min guard Details for Transfer Assistance: VCs for hand placement and controlled mobility Ambulation/Gait Ambulation/Gait Assistance: 4: Min guard Ambulation Distance (Feet): 160 Feet Assistive device: Rolling walker Ambulation/Gait Assistance Details: required 3 standing rest breaks, tolerated well, good stability with ambulation using rw Gait Pattern: Step-through pattern;Decreased stride length;Trunk flexed Gait velocity: decreased    Exercises Total Joint Exercises Marching in Standing: Both;20 reps General Exercises - Lower  Extremity Ankle Circles/Pumps: AROM;Both;20 reps;Supine Long Arc Quad: AROM;Both;10 reps Mini-Sqauts: AROM;Strengthening;10 reps     PT Goals Acute Rehab PT Goals PT Goal Formulation: With patient Time For Goal Achievement: 06/26/12 Potential to Achieve Goals: Good Pt will go Supine/Side to Sit: with modified independence PT Goal: Supine/Side to Sit - Progress: Progressing toward goal Pt will go Sit to Stand: with modified independence PT Goal: Sit to Stand - Progress: Progressing toward goal Pt will Ambulate: >150 feet;with modified independence PT Goal: Ambulate - Progress: Progressing toward goal Pt will Go Up / Down Stairs: 3-5 stairs;with min assist  Visit Information  Last PT Received On: 06/19/12 Assistance Needed: +1    Subjective Data  Subjective: I feel so much better  Patient Stated Goal: to go home   Cognition  Cognition Arousal/Alertness: Awake/alert Behavior During Therapy: WFL for tasks assessed/performed Overall Cognitive Status: Within Functional Limits for tasks assessed    Balance  Static Sitting Balance Static Sitting - Balance Support: Feet supported Static Sitting - Level of Assistance: 7: Independent Static Standing Balance Static Standing - Balance Support: Bilateral upper extremity supported Static Standing - Level of Assistance: 6: Modified independent (Device/Increase time) Static Standing - Comment/# of Minutes: use of rw  End of Session PT - End of Session Equipment Utilized During Treatment: Gait belt Activity Tolerance: Patient tolerated treatment well;Patient limited by fatigue Patient left: in chair;with call bell/phone within reach Nurse Communication: Mobility status   GP     Fabio Asa 06/19/2012, 11:44 AM Charlotte Crumb, PT DPT  [phone]

## 2012-06-19 NOTE — Consult Note (Signed)
[facility] INPATIENT PROGRESS NOTE  Name: Brett [name]      MRN: [id number]    Location: [id number]/1B14N-[id number]  Date: 06/19/2012 Time:10:54 AM   Subjective: Interval History:Brett [name] reported feeling well.  He was extubated two days ago.  He has been doing well.  His left leg was ace-wrapped and no longer has fluid oozing.  He denied fever, mucositis, visible bleeding, SOB, chest pain, abd pain.  He still has generalized fatigue.  He is able to ambulate to the bathroom himself.   Objective: Vital signs in last 24 hours: Temp:  [98 F (36.7 C)-98.6 F (37 C)] 98.6 F (37 C) (06/12 0552) Pulse Rate:  [64-109] 109 (06/12 0943) Resp:  [20] 20 (06/12 0552) BP: (130-150)/(72-89) 150/86 mmHg (06/12 0943) SpO2:  [97 %-98 %] 98 % (06/12 0552) Weight:  [288 lb (130.636 kg)] 288 lb (130.636 kg) (06/11 1530)     PHYSICAL EXAM:  General: Obese man.  Eyes: no scleral icterus. ENT:  Intubated.  There was no blood in his nose. Respiratory: lungs showed mild bibasilar crackles.  Cardiovascular: Regular rate and rhythm, S1/S2, without murmur, rub or gallop. There was 2+pedal edema with venous stasis changes .  His left leg was wrapped.  GI: abdomen was soft, flat, nontender, nondistended, without organomegaly.   Studies/Results: Results for orders placed during the hospital encounter of 06/06/12 (from the past 48 hour(s))  HEPARIN LEVEL (UNFRACTIONATED)     Status: Abnormal   Collection Time    06/17/12 10:00 PM      Result Value Range   Heparin Unfractionated 0.16 (*) 0.30 - 0.70 IU/mL   Comment:            IF HEPARIN RESULTS ARE BELOW     EXPECTED VALUES, AND PATIENT     DOSAGE HAS BEEN CONFIRMED,     SUGGEST FOLLOW UP TESTING     OF ANTITHROMBIN III LEVELS.  CBC     Status: Abnormal   Collection Time    06/18/12  5:10 AM      Result Value Range   WBC 6.4  4.0 - 10.5 K/uL   RBC 2.91 (*) 4.22 - 5.81 MIL/uL   Hemoglobin 8.1 (*) 13.0 - 17.0 g/dL   HCT 95.6 (*) 21.3 -  52.0 %   MCV 86.3  78.0 - 100.0 fL   MCH 27.8  26.0 - 34.0 pg   MCHC 32.3  30.0 - 36.0 g/dL   RDW 08.6 (*) 57.8 - 46.9 %   Platelets 112 (*) 150 - 400 K/uL   Comment: CONSISTENT WITH PREVIOUS RESULT  BASIC METABOLIC PANEL     Status: Abnormal   Collection Time    06/18/12  5:10 AM      Result Value Range   Sodium 140  135 - 145 mEq/L   Potassium 3.3 (*) 3.5 - 5.1 mEq/L   Chloride 102  96 - 112 mEq/L   CO2 32  19 - 32 mEq/L   Glucose, Bld 91  70 - 99 mg/dL   BUN 31 (*) 6 - 23 mg/dL   Creatinine, Ser 6.29  0.50 - 1.35 mg/dL   Calcium 8.3 (*) 8.4 - 10.5 mg/dL   GFR calc non Af Amer 58 (*) >90 mL/min   GFR calc Af Amer 68 (*) >90 mL/min   Comment:            The eGFR has been calculated     using the CKD EPI equation.  This calculation has not been     validated in all clinical     situations.     eGFR's persistently     <90 mL/min signify     possible Chronic Kidney Disease.  HEPARIN LEVEL (UNFRACTIONATED)     Status: None   Collection Time    06/18/12  5:10 AM      Result Value Range   Heparin Unfractionated 0.37  0.30 - 0.70 IU/mL   Comment:            IF HEPARIN RESULTS ARE BELOW     EXPECTED VALUES, AND PATIENT     DOSAGE HAS BEEN CONFIRMED,     SUGGEST FOLLOW UP TESTING     OF ANTITHROMBIN III LEVELS.  HEPARIN LEVEL (UNFRACTIONATED)     Status: None   Collection Time    06/18/12  8:50 PM      Result Value Range   Heparin Unfractionated 0.36  0.30 - 0.70 IU/mL   Comment:            IF HEPARIN RESULTS ARE BELOW     EXPECTED VALUES, AND PATIENT     DOSAGE HAS BEEN CONFIRMED,     SUGGEST FOLLOW UP TESTING     OF ANTITHROMBIN III LEVELS.  CBC     Status: Abnormal   Collection Time    06/19/12  5:15 AM      Result Value Range   WBC 4.9  4.0 - 10.5 K/uL   RBC 2.85 (*) 4.22 - 5.81 MIL/uL   Hemoglobin 8.0 (*) 13.0 - 17.0 g/dL   HCT 16.1 (*) 09.6 - 04.5 %   MCV 86.0  78.0 - 100.0 fL   MCH 28.1  26.0 - 34.0 pg   MCHC 32.7  30.0 - 36.0 g/dL   RDW 40.9 (*) 81.1  - 15.5 %   Platelets 99 (*) 150 - 400 K/uL   Comment: CONSISTENT WITH PREVIOUS RESULT  HEPARIN LEVEL (UNFRACTIONATED)     Status: None   Collection Time    06/19/12  5:15 AM      Result Value Range   Heparin Unfractionated 0.31  0.30 - 0.70 IU/mL   Comment:            IF HEPARIN RESULTS ARE BELOW     EXPECTED VALUES, AND PATIENT     DOSAGE HAS BEEN CONFIRMED,     SUGGEST FOLLOW UP TESTING     OF ANTITHROMBIN III LEVELS.  BASIC METABOLIC PANEL     Status: Abnormal   Collection Time    06/19/12  5:15 AM      Result Value Range   Sodium 140  135 - 145 mEq/L   Potassium 4.0  3.5 - 5.1 mEq/L   Comment: DELTA CHECK NOTED   Chloride 103  96 - 112 mEq/L   CO2 31  19 - 32 mEq/L   Glucose, Bld 87  70 - 99 mg/dL   BUN 25 (*) 6 - 23 mg/dL   Creatinine, Ser 9.14  0.50 - 1.35 mg/dL   Calcium 8.3 (*) 8.4 - 10.5 mg/dL   GFR calc non Af Amer 65 (*) >90 mL/min   GFR calc Af Amer 75 (*) >90 mL/min   Comment:            The eGFR has been calculated     using the CKD EPI equation.     This calculation has not been     validated in all clinical  situations.     eGFR's persistently     <90 mL/min signify     possible Chronic Kidney Disease.     MEDICATIONS: reviewed.     PROBLEM LIST:  1. Afib; was on Pradaxa  2. Sepsis with blood culture x 2 Staph coag neg; and sputum with fungal species.  3. Acute renal failure from presumably sepsis; now resolved.  4. Fever:  Resolved.  5. Acute anemia:  improved 6. Thrombocytopenia:  improved.  7. Coagulopathy from either sepsis, Pradaxa toxicity, or TTP.  8.  Venous stasis, worsened the last few days with gravity dependence.   9.  Acute bilateral lower extremity DVT.  10.  PEA arrest with plasma phoresis on 06/16/12.   RECOMMENDATIONS:   - Continue with empiric antibiotic.  - His anemia and thrombocytopenia are grossly stable being on observation off of plasma phoresis.  - He has been on Heparin IV gtt without bleeding complication.  I  recommended to switch to Lovenox 1mg /kg SQ BID at this time.  After 24 hours on Lovenox, if he does well, we may consider bridging to Coumadin.  - Due to severe bleeding complication on Pradaxa, I recommend to avoid Pradaxa.

## 2012-06-19 NOTE — Progress Notes (Addendum)
Patient ID: Brett [name], male   DOB: September 28, 1945, 67 y.o.   MRN: [id number]  Subjective: Currently, only complaint is chest wall soreness from CPR 3 days ago . Otherwise, feels better. Out of bed and in a chair eating breakfast. He is mostly unaware of his a-fib. Was able to walk around a lot more yesterday.  Began to feel tired at the end of his PT session, but no significant dyspnea He says that he is " feeling stronger every day".  Objective: Vital signs in last 24 hours: Temp:  [98 F (36.7 C)-98.6 F (37 C)] 98.6 F (37 C) (06/12 0552) Pulse Rate:  [64-109] 109 (06/12 0943) Resp:  [20] 20 (06/12 0552) BP: (130-150)/(72-89) 150/86 mmHg (06/12 0943) SpO2:  [97 %-98 %] 98 % (06/12 0552) Weight:  [288 lb (130.636 kg)] 288 lb (130.636 kg) (06/11 1530) Last BM Date: 06/18/12   Intake/Output from previous day: 06/11 0701 - 06/12 0700 In: 1667.5 [P.O.:1440; I.V.:227.5] Out: 2450 [Urine:2450] Intake/Output this shift: Total I/O In: 240 [P.O.:240] Out: 250 [Urine:250]  Medications Current Facility-Administered Medications  Medication Dose Route Frequency Provider Last Rate Last Dose  . 0.9 %  sodium chloride infusion   Intravenous Continuous Merwyn Katos, MD 20 mL/hr at 06/18/12 0800    . acetaminophen (TYLENOL) tablet 650 mg  650 mg Oral Q6H PRN Alyson Reedy, MD   650 mg at 06/18/12 1229  . ceFAZolin (ANCEF) IVPB 2 g/50 mL premix  2 g Intravenous Q8H Rhetta Mura, MD   2 g at 06/19/12 3086  . chlorhexidine (PERIDEX) 0.12 % solution 15 mL  15 mL Mouth/Throat BID Alyson Reedy, MD   15 mL at 06/19/12 1000  . diltiazem (CARDIZEM CD) 24 hr capsule 240 mg  240 mg Oral Daily Merwyn Katos, MD   240 mg at 06/19/12 0943  . ferrous sulfate tablet 325 mg  325 mg Oral BID WC Alyson Reedy, MD   325 mg at 06/19/12 0758  . heparin ADULT infusion 100 units/mL (25000 units/250 mL)  1,750 Units/hr Intravenous Continuous Alyson Reedy, MD 17.5 mL/hr at 06/18/12 2019 1,750  Units/hr at 06/18/12 2019  . hydrALAZINE (APRESOLINE) tablet 25 mg  25 mg Oral Q6H Russella Dar, NP   25 mg at 06/19/12 5784  . metoprolol (LOPRESSOR) tablet 100 mg  100 mg Oral BID Alyson Reedy, MD   100 mg at 06/19/12 0943  . pneumococcal 23 valent vaccine (PNU-IMMUNE) injection 0.5 mL  0.5 mL Intramuscular Prior to discharge Calvert Cantor, MD      . sodium chloride 0.9 % injection 10-40 mL  10-40 mL Intracatheter Q12H Alyson Reedy, MD      . sodium chloride 0.9 % injection 10-40 mL  10-40 mL Intracatheter PRN Alyson Reedy, MD   10 mL at 06/19/12 0921    PE: General appearance: alert, cooperative and no distress; pleasant mood & affect Lungs: clear to auscultation bilaterally, non-labored. Heart: borderline rapid, irregularly irregular rhythm, mildy elevated. No JVD Extremities: 2-3+ bilateral LLE, but stable; LLE with dressing on what is likely a draining venous stasis ulcer; mild venous stasis dermatitis bilateral LE. Pulses: 2+ and symmetric Skin: warm and dry Neurologic: Grossly normal  Lab Results:   Recent Labs  06/17/12 0227 06/18/12 0510 06/19/12 0515  WBC 13.4* 6.4 4.9  HGB 8.9* 8.1* 8.0*  HCT 26.7* 25.1* 24.5*  PLT 107* 112* 99*   BMET  Recent Labs  06/17/12 0227 06/18/12  0510 06/19/12 0515  NA 141 140 140  K 3.7 3.3* 4.0  CL 104 102 103  CO2 30 32 31  GLUCOSE 119* 91 87  BUN 32* 31* 25*  CREATININE 1.40* 1.24 1.14  CALCIUM 8.0* 8.3* 8.3*    Studies/Results:  TEE 06/17/12 FINDINGS:  No vegetations are seen.  Normal left and right ventricular function.  No significant valvular abnormalities.  LVH.  No intracardiac thrombus.  RECOMMENDATIONS:  No evidence for endocarditis.   Assessment/Plan  Principal Problem:   TTP (thrombotic thrombocytopenic purpura) Active Problems:   Sepsis with metabolic encephalopathy   Septic shock   ARF (acute renal failure)   Metabolic acidosis   Hyperglycemia   Diarrhea   Acute respiratory failure due  to encpehalopathy   Atrial fibrillation with RVR   Bacteremia due to coagulase-negative Staphylococcus   ? Acute ischemic colitis   ? OSA (obstructive sleep apnea)   Acute post-hemorrhagic anemia   Hypokalemia   Cardiac arrest  Pt continues to improve. No further bleeding. Plasmapheresis is completed. S/p PEA arrest 3 days ago. Resuscitated with CPR. Still has chest wall soreness.  TEE 6/10 -- ruled-out endocarditis.  He continues in A-fib. HR is controlled in the upper 80s-low 90s to 110s, but asymptomatic & appropriate in the setting of Anemia (Hgb ~8).    LOS: 13 days   No indication that Multaq is related to TTP, will most likely restart once he seems more overall stable, but will need to ensure stable anticoagulation regimen -- would probably prefer d/c on a new NOAC agent - Xarelto @ DVT dose vs. Warfarin & restart Multaq (or other antiarrhythmic agent in OP f/u) then consider elective DCCV in ~4 weeks if still in Afib (will need to review his history, may have been unsuccessful with DCCV in past).  His HR should improve as his H/H improves.  Would probably opt for a different NOAC agent, although Pradaxa toxicity is not likely the cause, it is not worth the risk -- would consider Xarelto on DVT Rx dose (Eliquis does not yet have the indication for DVT).  Will need to confer with Hematology for their opinion.  May need to simply use warfarin.  Will change Hydralazine to 50 mg bid.  He seems to be tolerating HRs 80s-110s without CHF Sx, would not feel inclined to aggressively Rx.    With notable edema bilaterally, probably due to Sepsis / Septic shock volume repletion; will put on standing low dose diuretic as his renal function has normalized.  Will need to Monitor electrolytes closely - keep K+ > 4; will put on standing repletion dose.  We will follow along, no more Cardio or Pulmonary issues overnight -- anticipate transfer to Tele floor today.  MD Time with pt: 5  min  Munachimso Palin W, M.D., M.S. [facility] [address] [place], [place]  [address]  [phone] Pager # [phone] 06/19/2012 10:38 AM

## 2012-06-20 ENCOUNTER — Other Ambulatory Visit (HOSPITAL_COMMUNITY): Payer: Self-pay

## 2012-06-20 DIAGNOSIS — J9819 Other pulmonary collapse: Secondary | ICD-10-CM | POA: Diagnosis not present

## 2012-06-20 DIAGNOSIS — J90 Pleural effusion, not elsewhere classified: Secondary | ICD-10-CM | POA: Diagnosis not present

## 2012-06-20 LAB — COMPREHENSIVE METABOLIC PANEL
ALT: 9 U/L (ref 0–53)
AST: 25 U/L (ref 0–37)
Albumin: 2.8 g/dL — ABNORMAL LOW (ref 3.5–5.2)
Alkaline Phosphatase: 55 U/L (ref 39–117)
Calcium: 7.9 mg/dL — ABNORMAL LOW (ref 8.4–10.5)
GFR calc Af Amer: 84 mL/min — ABNORMAL LOW (ref >90)
Glucose, Bld: 82 mg/dL (ref 70–99)
Potassium: 3 mEq/L — ABNORMAL LOW (ref 3.5–5.1)
Sodium: 137 mEq/L (ref 135–145)
Total Protein: 5.5 g/dL — ABNORMAL LOW (ref 6.0–8.3)

## 2012-06-20 LAB — CBC WITH DIFFERENTIAL/PLATELET
Basophils Absolute: 0 10*3/uL (ref 0.0–0.1)
Basophils Relative: 1 % (ref 0–1)
Eosinophils Absolute: 0 10*3/uL (ref 0.0–0.7)
Eosinophils Relative: 1 % (ref 0–5)
MCH: 28.4 pg (ref 26.0–34.0)
MCV: 86 fL (ref 78.0–100.0)
Neutrophils Relative %: 43 % (ref 43–77)
Platelets: 95 10*3/uL — ABNORMAL LOW (ref 150–400)
RDW: 17.3 % — ABNORMAL HIGH (ref 11.5–15.5)

## 2012-06-20 LAB — CK TOTAL AND CKMB (NOT AT ARMC): CK, MB: 3 ng/mL (ref 0.3–4.0)

## 2012-06-20 LAB — HEMOGLOBIN A1C
Hgb A1c MFr Bld: 5.6 % (ref <5.7)
Mean Plasma Glucose: 114 mg/dL (ref <117)

## 2012-06-20 LAB — PROCALCITONIN: Procalcitonin: 0.57 ng/mL

## 2012-06-20 MED FILL — Medication: Qty: 1 | Status: AC

## 2012-06-21 LAB — BASIC METABOLIC PANEL
BUN: 16 mg/dL (ref 6–23)
Creatinine, Ser: 1.06 mg/dL (ref 0.50–1.35)
GFR calc non Af Amer: 71 mL/min — ABNORMAL LOW (ref >90)
Glucose, Bld: 97 mg/dL (ref 70–99)
Potassium: 3.4 mEq/L — ABNORMAL LOW (ref 3.5–5.1)

## 2012-06-21 LAB — CBC
HCT: 23.4 % — ABNORMAL LOW (ref 39.0–52.0)
Hemoglobin: 7.8 g/dL — ABNORMAL LOW (ref 13.0–17.0)
MCH: 28.5 pg (ref 26.0–34.0)
MCHC: 33.3 g/dL (ref 30.0–36.0)
RDW: 17.5 % — ABNORMAL HIGH (ref 11.5–15.5)

## 2012-06-21 LAB — MAGNESIUM: Magnesium: 1.6 mg/dL (ref 1.5–2.5)

## 2012-06-22 LAB — EXPECTORATED SPUTUM ASSESSMENT W GRAM STAIN, RFLX TO RESP C: Special Requests: NORMAL

## 2012-06-22 LAB — URINE CULTURE

## 2012-06-23 ENCOUNTER — Other Ambulatory Visit (HOSPITAL_COMMUNITY): Payer: Self-pay

## 2012-06-23 DIAGNOSIS — R0989 Other specified symptoms and signs involving the circulatory and respiratory systems: Secondary | ICD-10-CM | POA: Diagnosis not present

## 2012-06-23 LAB — BASIC METABOLIC PANEL
BUN: 15 mg/dL (ref 6–23)
CO2: 31 mEq/L (ref 19–32)
Calcium: 8.6 mg/dL (ref 8.4–10.5)
Glucose, Bld: 84 mg/dL (ref 70–99)
Sodium: 138 mEq/L (ref 135–145)

## 2012-06-23 LAB — CBC
HCT: 24.4 % — ABNORMAL LOW (ref 39.0–52.0)
Hemoglobin: 8 g/dL — ABNORMAL LOW (ref 13.0–17.0)
MCH: 28.5 pg (ref 26.0–34.0)
MCV: 86.8 fL (ref 78.0–100.0)
RBC: 2.81 MIL/uL — ABNORMAL LOW (ref 4.22–5.81)

## 2012-06-24 LAB — CBC
Hemoglobin: 8 g/dL — ABNORMAL LOW (ref 13.0–17.0)
MCH: 28.9 pg (ref 26.0–34.0)
MCHC: 33.2 g/dL (ref 30.0–36.0)
MCV: 87 fL (ref 78.0–100.0)
Platelets: 87 10*3/uL — ABNORMAL LOW (ref 150–400)
RBC: 2.77 MIL/uL — ABNORMAL LOW (ref 4.22–5.81)

## 2012-06-24 LAB — WOUND CULTURE: Gram Stain: NONE SEEN

## 2012-06-25 LAB — CBC WITH DIFFERENTIAL/PLATELET
Basophils Relative: 1 % (ref 0–1)
Eosinophils Absolute: 0.1 10*3/uL (ref 0.0–0.7)
Eosinophils Relative: 1 % (ref 0–5)
HCT: 24.4 % — ABNORMAL LOW (ref 39.0–52.0)
Hemoglobin: 8 g/dL — ABNORMAL LOW (ref 13.0–17.0)
MCH: 28.8 pg (ref 26.0–34.0)
MCHC: 32.8 g/dL (ref 30.0–36.0)
Monocytes Absolute: 0.4 10*3/uL (ref 0.1–1.0)
Monocytes Relative: 11 % (ref 3–12)

## 2012-06-25 LAB — BASIC METABOLIC PANEL
BUN: 16 mg/dL (ref 6–23)
CO2: 30 mEq/L (ref 19–32)
Calcium: 8.4 mg/dL (ref 8.4–10.5)
Chloride: 99 mEq/L (ref 96–112)
Creatinine, Ser: 1.09 mg/dL (ref 0.50–1.35)

## 2012-06-25 LAB — CULTURE, RESPIRATORY W GRAM STAIN

## 2012-06-26 ENCOUNTER — Other Ambulatory Visit (HOSPITAL_COMMUNITY): Payer: Self-pay | Admitting: Cardiology

## 2012-06-27 LAB — CULTURE, BLOOD (ROUTINE X 2): Culture: NO GROWTH

## 2012-06-27 LAB — CBC
Hemoglobin: 8.4 g/dL — ABNORMAL LOW (ref 13.0–17.0)
MCHC: 32.7 g/dL (ref 30.0–36.0)
RBC: 2.92 MIL/uL — ABNORMAL LOW (ref 4.22–5.81)
WBC: 3.9 10*3/uL — ABNORMAL LOW (ref 4.0–10.5)

## 2012-06-27 LAB — MAGNESIUM: Magnesium: 1.8 mg/dL (ref 1.5–2.5)

## 2012-06-27 LAB — BASIC METABOLIC PANEL
CO2: 27 mEq/L (ref 19–32)
GFR calc non Af Amer: 71 mL/min — ABNORMAL LOW (ref >90)
Glucose, Bld: 85 mg/dL (ref 70–99)
Potassium: 3.8 mEq/L (ref 3.5–5.1)
Sodium: 136 mEq/L (ref 135–145)

## 2012-06-27 LAB — PROTIME-INR
INR: 1.08 (ref 0.00–1.49)
Prothrombin Time: 13.9 seconds (ref 11.6–15.2)

## 2012-06-28 LAB — CBC
MCH: 28.9 pg (ref 26.0–34.0)
MCV: 87.7 fL (ref 78.0–100.0)
Platelets: 112 10*3/uL — ABNORMAL LOW (ref 150–400)
RDW: 18.5 % — ABNORMAL HIGH (ref 11.5–15.5)
WBC: 4.3 10*3/uL (ref 4.0–10.5)

## 2012-06-28 LAB — PROTIME-INR: Prothrombin Time: 16.2 seconds — ABNORMAL HIGH (ref 11.6–15.2)

## 2012-06-28 LAB — BASIC METABOLIC PANEL
Calcium: 8.6 mg/dL (ref 8.4–10.5)
Creatinine, Ser: 1.02 mg/dL (ref 0.50–1.35)
GFR calc Af Amer: 86 mL/min — ABNORMAL LOW (ref >90)

## 2012-06-29 LAB — PROTIME-INR: Prothrombin Time: 16.4 seconds — ABNORMAL HIGH (ref 11.6–15.2)

## 2012-06-30 LAB — CBC
HCT: 27.6 % — ABNORMAL LOW (ref 39.0–52.0)
MCH: 28.4 pg (ref 26.0–34.0)
MCHC: 32.2 g/dL (ref 30.0–36.0)
MCV: 88.2 fL (ref 78.0–100.0)
RDW: 18.5 % — ABNORMAL HIGH (ref 11.5–15.5)

## 2012-06-30 LAB — BASIC METABOLIC PANEL
BUN: 15 mg/dL (ref 6–23)
Creatinine, Ser: 1.17 mg/dL (ref 0.50–1.35)
GFR calc Af Amer: 73 mL/min — ABNORMAL LOW (ref >90)
GFR calc non Af Amer: 63 mL/min — ABNORMAL LOW (ref >90)

## 2012-07-01 LAB — POCT I-STAT, CHEM 8
Creatinine, Ser: 1.3 mg/dL (ref 0.50–1.35)
Glucose, Bld: 127 mg/dL — ABNORMAL HIGH (ref 70–99)
Hemoglobin: 8.5 g/dL — ABNORMAL LOW (ref 13.0–17.0)
Potassium: 3 mEq/L — ABNORMAL LOW (ref 3.5–5.1)

## 2012-07-02 LAB — CBC
HCT: 28.7 % — ABNORMAL LOW (ref 39.0–52.0)
MCV: 88 fL (ref 78.0–100.0)
RDW: 18.4 % — ABNORMAL HIGH (ref 11.5–15.5)
WBC: 5.3 10*3/uL (ref 4.0–10.5)

## 2012-07-02 LAB — BASIC METABOLIC PANEL
BUN: 18 mg/dL (ref 6–23)
CO2: 23 mEq/L (ref 19–32)
Chloride: 100 mEq/L (ref 96–112)
Creatinine, Ser: 1.17 mg/dL (ref 0.50–1.35)
GFR calc Af Amer: 73 mL/min — ABNORMAL LOW (ref >90)
Glucose, Bld: 89 mg/dL (ref 70–99)

## 2012-07-04 LAB — PROTIME-INR: INR: 2.14 — ABNORMAL HIGH (ref 0.00–1.49)

## 2012-07-04 LAB — HEMOGLOBIN AND HEMATOCRIT, BLOOD
HCT: 30 % — ABNORMAL LOW (ref 39.0–52.0)
Hemoglobin: 10 g/dL — ABNORMAL LOW (ref 13.0–17.0)

## 2012-07-05 LAB — PROTIME-INR
INR: 2.15 — ABNORMAL HIGH (ref 0.00–1.49)
Prothrombin Time: 23.3 seconds — ABNORMAL HIGH (ref 11.6–15.2)

## 2012-07-07 LAB — COMPREHENSIVE METABOLIC PANEL
Albumin: 3.5 g/dL (ref 3.5–5.2)
BUN: 24 mg/dL — ABNORMAL HIGH (ref 6–23)
Creatinine, Ser: 1.24 mg/dL (ref 0.50–1.35)
GFR calc Af Amer: 68 mL/min — ABNORMAL LOW (ref >90)
Glucose, Bld: 91 mg/dL (ref 70–99)
Total Bilirubin: 0.5 mg/dL (ref 0.3–1.2)
Total Protein: 7.5 g/dL (ref 6.0–8.3)

## 2012-07-07 LAB — PREALBUMIN: Prealbumin: 27 mg/dL (ref 17.0–34.0)

## 2012-07-07 LAB — CBC WITH DIFFERENTIAL/PLATELET
Basophils Relative: 1 % (ref 0–1)
Eosinophils Absolute: 0.1 10*3/uL (ref 0.0–0.7)
HCT: 32 % — ABNORMAL LOW (ref 39.0–52.0)
Hemoglobin: 10.4 g/dL — ABNORMAL LOW (ref 13.0–17.0)
Lymphs Abs: 2.6 10*3/uL (ref 0.7–4.0)
MCH: 28 pg (ref 26.0–34.0)
MCHC: 32.5 g/dL (ref 30.0–36.0)
MCV: 86.3 fL (ref 78.0–100.0)
Monocytes Absolute: 0.7 10*3/uL (ref 0.1–1.0)
Monocytes Relative: 12 % (ref 3–12)

## 2012-07-08 LAB — PROTIME-INR: Prothrombin Time: 24.7 seconds — ABNORMAL HIGH (ref 11.6–15.2)

## 2012-07-09 LAB — PROTIME-INR
INR: 2.48 — ABNORMAL HIGH (ref 0.00–1.49)
Prothrombin Time: 26 seconds — ABNORMAL HIGH (ref 11.6–15.2)

## 2012-07-14 ENCOUNTER — Ambulatory Visit (INDEPENDENT_AMBULATORY_CARE_PROVIDER_SITE_OTHER): Payer: Medicare Other | Admitting: Pharmacist Clinician (PhC)/ Clinical Pharmacy Specialist

## 2012-07-14 DIAGNOSIS — Z7901 Long term (current) use of anticoagulants: Secondary | ICD-10-CM | POA: Diagnosis not present

## 2012-07-14 DIAGNOSIS — I4891 Unspecified atrial fibrillation: Secondary | ICD-10-CM

## 2012-07-22 DIAGNOSIS — I4891 Unspecified atrial fibrillation: Secondary | ICD-10-CM | POA: Diagnosis not present

## 2012-07-22 DIAGNOSIS — E785 Hyperlipidemia, unspecified: Secondary | ICD-10-CM | POA: Diagnosis not present

## 2012-07-22 DIAGNOSIS — E669 Obesity, unspecified: Secondary | ICD-10-CM | POA: Diagnosis not present

## 2012-07-22 DIAGNOSIS — I10 Essential (primary) hypertension: Secondary | ICD-10-CM | POA: Diagnosis not present

## 2012-07-22 DIAGNOSIS — Z79899 Other long term (current) drug therapy: Secondary | ICD-10-CM | POA: Diagnosis not present

## 2012-07-22 DIAGNOSIS — I2510 Atherosclerotic heart disease of native coronary artery without angina pectoris: Secondary | ICD-10-CM | POA: Diagnosis not present

## 2012-07-24 ENCOUNTER — Other Ambulatory Visit: Payer: Self-pay | Admitting: Pharmacist Clinician (PhC)/ Clinical Pharmacy Specialist

## 2012-07-28 ENCOUNTER — Ambulatory Visit (INDEPENDENT_AMBULATORY_CARE_PROVIDER_SITE_OTHER): Payer: Medicare Other | Admitting: Pharmacist Clinician (PhC)/ Clinical Pharmacy Specialist

## 2012-07-28 VITALS — BP 96/66 | HR 76

## 2012-07-28 DIAGNOSIS — I4891 Unspecified atrial fibrillation: Secondary | ICD-10-CM | POA: Diagnosis not present

## 2012-07-28 DIAGNOSIS — Z7901 Long term (current) use of anticoagulants: Secondary | ICD-10-CM | POA: Diagnosis not present

## 2012-08-07 ENCOUNTER — Encounter: Payer: Self-pay | Admitting: *Deleted

## 2012-08-11 ENCOUNTER — Ambulatory Visit (INDEPENDENT_AMBULATORY_CARE_PROVIDER_SITE_OTHER): Payer: Medicare Other | Admitting: Pharmacist Clinician (PhC)/ Clinical Pharmacy Specialist

## 2012-08-11 ENCOUNTER — Ambulatory Visit (INDEPENDENT_AMBULATORY_CARE_PROVIDER_SITE_OTHER): Payer: Medicare Other | Admitting: Cardiology

## 2012-08-11 ENCOUNTER — Encounter: Payer: Self-pay | Admitting: Cardiology

## 2012-08-11 VITALS — BP 122/78 | HR 83 | Ht 71.0 in

## 2012-08-11 DIAGNOSIS — Z7901 Long term (current) use of anticoagulants: Secondary | ICD-10-CM

## 2012-08-11 DIAGNOSIS — I4891 Unspecified atrial fibrillation: Secondary | ICD-10-CM

## 2012-08-11 DIAGNOSIS — B957 Other staphylococcus as the cause of diseases classified elsewhere: Secondary | ICD-10-CM

## 2012-08-11 DIAGNOSIS — R7881 Bacteremia: Secondary | ICD-10-CM | POA: Diagnosis not present

## 2012-08-11 DIAGNOSIS — I82403 Acute embolism and thrombosis of unspecified deep veins of lower extremity, bilateral: Secondary | ICD-10-CM

## 2012-08-11 DIAGNOSIS — I82409 Acute embolism and thrombosis of unspecified deep veins of unspecified lower extremity: Secondary | ICD-10-CM | POA: Diagnosis not present

## 2012-08-11 DIAGNOSIS — E785 Hyperlipidemia, unspecified: Secondary | ICD-10-CM

## 2012-08-11 DIAGNOSIS — I4819 Other persistent atrial fibrillation: Secondary | ICD-10-CM

## 2012-08-11 MED ORDER — SILVER SULFADIAZINE 1 % EX CREA: 1.0000 "application " | TOPICAL_CREAM | Freq: Two times a day (BID) | CUTANEOUS | Status: DC

## 2012-08-11 NOTE — Patient Instructions (Addendum)
Your physician wants you to follow-up in 6 mont Dr Herbie [place].  You will receive a reminder letter in the mail two months in advance. If you don't receive a letter, please call our office to schedule the follow-up appointment.   You may stop Niaspan,NTG patch  We ordered silverdene cream

## 2012-08-11 NOTE — Progress Notes (Signed)
Patient ID: Brett [name], male   DOB: 1945-01-30, 67 y.o.   MRN: [id number]  PCP: [name]., MD  Clinic Note: Chief Complaint  Patient presents with  . Hospitalization Follow-up    Paroxysmal atrial fib; -- admitted for sepsis, TTP   HPI: Brett [name] is a 67 y.o. the obese male with a PMH of  recurrent paroxysmal atrial fibrillation anticoagulated with Pradaxa and rhythm controlled with Multaq, hypertension and OSA on CPAP.  In no elective cardioversion in May 2013.    He was last seen in February 2014, and had been doing relatively well until he was admitted on May 30 of this year after feeling quite poorly for 68 weeks. He had shaking chills. This was followed by nonbloody diarrhea. Upon arrival to the emergency room he was hypotensive resuscitated with large volumes of fluid. He was admitted to [facility] M./ICU under sepsis protocol.  Within the first day of the hospitalization he had significant epistaxis and bleeding from all different lines. His hematuria worsened, cytopenia and coagulopathy as well as anemia and renal failure. It was unclear persistently coagulopathy due to Pradaxa or it could possibly be due to TTP. For short-term he underwent hemodialysis to remove the Pradaxa from the system as well as plasmapheresis to treat TTP.  During this timeframe he is also treated with high-dose antibiotics. Of course his Multaq and beta blocker were held during hospitalization for obvious reasons of hypotension, but he then [facility] into atrial fibrillation with rapid ventricular rate. He was rate control with IV diltiazem. During this time he was unaware of being in atrial fibrillation. He underwent TEE to evaluate for endocarditis that was negative for vegetation and showed normal biventricular function with no intracardiac thrombus. At some point during the hospitalization he did suffer cardiac arrest which was thought to be mostly respiratory in nature.  At no time did he have any  significant heart failure issues during hospitalization. He did develop bilateral DVTs, and evaluation was reinitiated and he was to put on warfarin as opposed to Pradaxa due to concern for possible toxicity. He was taken off Multaq as well for possible toxicity reasons. He was discharged on June 12 then spent 2 weeks in rehabilitation. He now presents for followup.  Interval History: Brett [name] looks pretty much the same as he all his does. He is wearing lower extremities compression stockings and is using wound care structures to take care of weeping wounds on his shins including Silver sulfadiazine.Marland Kitchen  He actually says that he feeling pretty well. He is feels tired when he walks any distance he gets older short of breath with it which is different than before over the last several days has gotten stronger and feeling better. He has no sensation of being in atrial fibrillation currently. Denies any palpitations, lightheadedness, dizziness or wooziness. No syncope or near-syncope the symptoms. Once again a rehabilitation he's been getting stronger everyday. He denies any chest pain or pressure during rest or exertion. No real dyspnea with exertion exertional as he really goes along distance.  No further bleeding episodes. No melena, hematochezia or hematuria. He is getting his INR levels checked now.  Past Medical History  Diagnosis Date  . Persistent atrial fibrillation 02/2011    Negative Myoview; Normal EF by Echo  . Obesity (BMI 30-39.9)   . Hypertension   . Dyslipidemia   . OSA on CPAP   . History of echocardiogram 02/07/2011    EF 50-60%; RV mildly dilated, LA  moderately dilated; mild MR; mild-mod TR; RV systolic pressure elevated; mildly sclerotic AV  . History of nuclear stress test 02/07/2011    lexiscan; mild ischemia in mid inferolateral & apical lateral regions; low risk scan  . Severe sepsis with acute organ dysfunction 5/30-6/12/2012    Prolonged hospitalization for presumed  sepsis and septic shock compounded by coagulopathy thought to be TTP versus Pradaxa mediated. Temporarily on hemodialysis and plasmapheresis.  . DVT of lower extremity, bilateral 06/17/2012    Right CFV and popliteal vein, left C. it the only;  also interstitial fluid noted in both calfs  . History: TTP (thrombotic thrombocytopenic purpura) 06/11/2012  . History of: Bacteremia due to coagulase-negative Staphylococcus 06/13/2012    During hospitalization May 30 through 06/19/2012    Prior Cardiac Evaluation and Past Surgical History: Past Surgical History  Procedure Laterality Date  . Cardioversion  06/08/2011    Procedure: CARDIOVERSION;  Surgeon: Marykay Lex, MD;  Location: [facility] OR;  Service: Cardiovascular;  Laterality: N/A;  . Total knee arthroplasty      Left knee 2010, Right knee 2011    No Known Allergies  Current Outpatient Prescriptions  Medication Sig Dispense Refill  . diltiazem (CARDIZEM CD) 240 MG 24 hr capsule Take 1 capsule (240 mg total) by mouth daily.      . fexofenadine (ALLEGRA) 180 MG tablet Take 180 mg by mouth daily.      . fish oil-omega-3 fatty acids 1000 MG capsule Take 1 g by mouth daily.      . hydrALAZINE (APRESOLINE) 50 MG tablet Take 1 tablet (50 mg total) by mouth 2 (two) times daily.      Marland Kitchen loratadine (CLARITIN) 10 MG tablet Take 10 mg by mouth daily.      . metoprolol (LOPRESSOR) 100 MG tablet Take 100 mg by mouth 3 (three) times daily.      . mometasone (ELOCON) 0.1 % cream Apply 1 application topically as directed. Apply to affected area.      . Multiple Vitamin (MULITIVITAMIN WITH MINERALS) TABS Take 1 tablet by mouth daily.      . NON FORMULARY at bedtime. CPAP      . pravastatin (PRAVACHOL) 80 MG tablet Take 80 mg by mouth daily.      . silver sulfADIAZINE (SILVADENE) 1 % cream Apply 1 application topically 2 (two) times daily. Apply to both legs once-twice daily. Wrap after application.  50 g  0  . warfarin (COUMADIN) 4 MG tablet Take 8 mg by mouth  daily.       No current facility-administered medications for this visit.   History   Social History  . Marital Status: Divorced    Spouse Name: N/A    Number of Children: 1  . Years of Education: N/A   Occupational History  . Not on file.   Social History Main Topics  . Smoking status: Former Smoker    Types: Cigars  . Smokeless tobacco: Not on file  . Alcohol Use: No     Comment: couple beers a year  . Drug Use: No  . Sexually Active: Not on file   Other Topics Concern  . Not on file   Social History Narrative   He is a divorced father of one. Prior to this hospitalization he was walking 5-10 minutes a day 4 times a week. He now is gradually building up his rehabilitation doing mild exercise daily.      He quit smoking in 1968 and rarely has  alcohol.    ROS: A comprehensive Review of Systems - Negative except Pertinent symptoms noted above. He just has fatigue with diffuse muscle aches because of his being bedbound. He has bilateral leg ulcers with chronic swelling. No fevers or chills or sweats since discharge. No change in GU or GI symptoms. Otherwise review of systems negative.  PHYSICAL EXAM BP 122/78  Pulse 83  Ht 5\' 11"  (1.803 m) General appearance: alert, cooperative, appears stated age, no distress, morbidly obese and Very pleasant mood and affect. A little more tired-appearing and usual. But looks fairly healthy post discharge. Neck: no adenopathy, no carotid bruit, no JVD and supple, symmetrical, trachea midline Lungs: clear to auscultation bilaterally, normal percussion bilaterally and Nonlabored, good air movement Heart: irregularly irregular rhythm, S1, S2 normal, no S3 or S4, no rub and No murmur Abdomen: soft, non-tender; bowel sounds normal; no masses,  no organomegaly and Obese Extremities: edema Probably at least 2+. He has compression stockings on the dressings for weeping wounds a brace he is appealing. No warmth to the touch nor edema. This is  actually not unusual for him to have a straight red or pressure wraps on his legs. and Chronic stasis changes noted Pulses: 2+ and symmetric That is on the wrists. Bilateral lower extremities are very difficult to palpate due to tube edeema and body habitus. Skin: Venous stasis changes both legs. Neurologic: Grossly normal  [id number] today: Yes Rate: 83 , Rhythm: Atrial fibrillation controlled rate. Nonspecific ST-T changes  Recent Labs: INR 2.3  ASSESSMENT / PLAN: Recovering remarkably well post very dangerous and scary hospitalization. He remains in atrial fibrillation but rate controlled.  It is a bit too soon post hospitalization to think that some of his fatigue and dyspnea on exertion Is not simply related to deconditioning. I do note in the past he is relatively intolerant to be in atrial fibrillation for longer time period  Persistent atrial fibrillation He is now back in atrial fibrillation, rate controlled. On diltiazem and metoprolol he seems to be doing okay.  When he first started Multaq, it was because he did not do well with the image fibrillation. He noted being fatigued and tired.   Plan:  For now continue with rate control to allow for more time for his body to heal from his recent hospitalization.  Would probably not restart Multaq for similar reasons    Although this may not be the best timing following his recent hospitalization,  now that he is only a grade control, I was discussing with him the potential options for further treatment. One of these is referral for atrial fibrillation ablation. That seemed to spark an interest him.  He like the concept of possibly not have to take medications for a while.  Plan: Referred to Dr. Hillis Range -- [facility] Cardiology Electrophysiology for consideration of possible atrial fibrillation ablation versus other antiarrhythmic agents.  DVT of lower extremity, bilateral He has chronic venous stasis, clearly causes edema.  Bilateral not this is related to him developing the bilateral DVTs or is part of his TTP. He is now on warfarin and therapeutic. This is also covering his therapeutic stroke prevention from atrial fibrillation.  I would be very hesitant to switch over to Xarelto after this recent fearful a denture with TTP.  Long term (current) use of anticoagulants Switch to Coumadin from Pradaxa less from cost than for concern for TTP, and the need to cover H. fibrillation CVA prophylaxis and DVT treatment..  Morbid obesity Now  he's starting to recover from his hospitalization, this very important instructed him to diet and exercise plan. He probably did lose some weight in the hospital, we should take advantage of that weight loss. Unfortunately weight today.  During his last visit we talked about considering referral to dietitian and exercise program. I think once he starts doing some more vigorous activity, he will not tolerate being in atrial fibrillation.  Dyslipidemia (high LDL; low HDL) He was on Niaspan, that was stopped on hospital. He is currently on Pravachol 80 mg daily. We should allow him to recover some before we recheck his lipid panel. We can probably do this when I see him back in 6 months.    Orders Placed This Encounter  Procedures  . EKG 12-Lead   Meds ordered this encounter  Medications  . silver sulfADIAZINE (SILVADENE) 1 % cream    Sig: Apply 1 application topically 2 (two) times daily. Apply to both legs once-twice daily. Wrap after application.    Dispense:  50 g    Refill:  0   Silvadene is for wound care up his leg ulcers I will defer timing at the venous insufficiency Dopplers done until these are healed. He probably was most have been done in May but they were not done nor the report is not in the chart. We'll try to look into this for his next visit.  Followup: 6 months  DAVID W. Herbie Baltimore, M.D., M.S. [facility] [address] [place], [place]  [address]  [phone] Pager # [phone]

## 2012-08-15 ENCOUNTER — Encounter: Payer: Self-pay | Admitting: Cardiology

## 2012-08-15 DIAGNOSIS — E785 Hyperlipidemia, unspecified: Secondary | ICD-10-CM | POA: Insufficient documentation

## 2012-08-15 NOTE — Assessment & Plan Note (Signed)
Switch to Coumadin from Pradaxa less from cost than for concern for TTP, and the need to cover H. fibrillation CVA prophylaxis and DVT treatment.Marland Kitchen

## 2012-08-15 NOTE — Assessment & Plan Note (Signed)
He was on Niaspan, that was stopped on hospital. He is currently on Pravachol 80 mg daily. We should allow him to recover some before we recheck his lipid panel. We can probably do this when I see him back in 6 months.

## 2012-08-15 NOTE — Assessment & Plan Note (Signed)
He is now back in atrial fibrillation, rate controlled. On diltiazem and metoprolol he seems to be doing okay.  When he first started Multaq, it was because he did not do well with the image fibrillation. He noted being fatigued and tired.   Plan:  For now continue with rate control to allow for more time for his body to heal from his recent hospitalization.  Would probably not restart Multaq for similar reasons    Although this may not be the best timing following his recent hospitalization,  now that he is only a grade control, I was discussing with him the potential options for further treatment. One of these is referral for atrial fibrillation ablation. That seemed to spark an interest him.  He like the concept of possibly not have to take medications for a while.  Plan: Referred to Dr. Hillis Range -- [facility] Cardiology Electrophysiology for consideration of possible atrial fibrillation ablation versus other antiarrhythmic agents.

## 2012-08-15 NOTE — Assessment & Plan Note (Addendum)
Now he's starting to recover from his hospitalization, this very important instructed him to diet and exercise plan. He probably did lose some weight in the hospital, we should take advantage of that weight loss. Unfortunately weight today.  During his last visit we talked about considering referral to dietitian and exercise program. I think once he starts doing some more vigorous activity, he will not tolerate being in atrial fibrillation.

## 2012-08-15 NOTE — Assessment & Plan Note (Signed)
He has chronic venous stasis, clearly causes edema. Bilateral not this is related to him developing the bilateral DVTs or is part of his TTP. He is now on warfarin and therapeutic. This is also covering his therapeutic stroke prevention from atrial fibrillation.  I would be very hesitant to switch over to Xarelto after this recent fearful a denture with TTP.

## 2012-09-15 ENCOUNTER — Ambulatory Visit: Payer: Medicare Other | Admitting: Pharmacist Clinician (PhC)/ Clinical Pharmacy Specialist

## 2012-09-15 ENCOUNTER — Ambulatory Visit (INDEPENDENT_AMBULATORY_CARE_PROVIDER_SITE_OTHER): Payer: Medicare Other | Admitting: Pharmacist Clinician (PhC)/ Clinical Pharmacy Specialist

## 2012-09-15 VITALS — BP 106/70 | HR 84

## 2012-09-15 DIAGNOSIS — I4891 Unspecified atrial fibrillation: Secondary | ICD-10-CM | POA: Diagnosis not present

## 2012-09-15 DIAGNOSIS — Z6839 Body mass index (BMI) 39.0-39.9, adult: Secondary | ICD-10-CM | POA: Diagnosis not present

## 2012-09-15 DIAGNOSIS — Z7901 Long term (current) use of anticoagulants: Secondary | ICD-10-CM

## 2012-09-15 DIAGNOSIS — Z713 Dietary counseling and surveillance: Secondary | ICD-10-CM | POA: Diagnosis not present

## 2012-09-15 DIAGNOSIS — I10 Essential (primary) hypertension: Secondary | ICD-10-CM | POA: Diagnosis not present

## 2012-09-15 DIAGNOSIS — E669 Obesity, unspecified: Secondary | ICD-10-CM | POA: Diagnosis not present

## 2012-09-15 DIAGNOSIS — I4819 Other persistent atrial fibrillation: Secondary | ICD-10-CM

## 2012-10-13 ENCOUNTER — Ambulatory Visit (INDEPENDENT_AMBULATORY_CARE_PROVIDER_SITE_OTHER): Payer: Medicare Other | Admitting: Pharmacist Clinician (PhC)/ Clinical Pharmacy Specialist

## 2012-10-13 VITALS — BP 108/66 | HR 64

## 2012-10-13 DIAGNOSIS — I4891 Unspecified atrial fibrillation: Secondary | ICD-10-CM

## 2012-10-13 DIAGNOSIS — Z7901 Long term (current) use of anticoagulants: Secondary | ICD-10-CM | POA: Diagnosis not present

## 2012-10-13 DIAGNOSIS — I4819 Other persistent atrial fibrillation: Secondary | ICD-10-CM

## 2012-10-13 LAB — POCT INR
INR: 3.4
INR: 3.4

## 2012-11-03 ENCOUNTER — Ambulatory Visit: Payer: Medicare Other | Admitting: Pharmacist Clinician (PhC)/ Clinical Pharmacy Specialist

## 2012-11-12 ENCOUNTER — Ambulatory Visit (INDEPENDENT_AMBULATORY_CARE_PROVIDER_SITE_OTHER): Payer: Medicare Other | Admitting: Pharmacist Clinician (PhC)/ Clinical Pharmacy Specialist

## 2012-11-12 ENCOUNTER — Other Ambulatory Visit: Payer: Self-pay | Admitting: Cardiology

## 2012-11-12 VITALS — BP 130/80 | HR 76

## 2012-11-12 DIAGNOSIS — I4891 Unspecified atrial fibrillation: Secondary | ICD-10-CM

## 2012-11-12 DIAGNOSIS — I4819 Other persistent atrial fibrillation: Secondary | ICD-10-CM

## 2012-11-12 DIAGNOSIS — Z7901 Long term (current) use of anticoagulants: Secondary | ICD-10-CM | POA: Diagnosis not present

## 2012-11-12 LAB — POCT INR: INR: 4.1

## 2012-11-12 NOTE — Telephone Encounter (Signed)
Please advise 

## 2012-11-12 NOTE — Telephone Encounter (Signed)
This is usually a PCP Rx, but is probably for Venous stasis.  OK to Rx.  Marykay Lex, MD

## 2012-11-13 NOTE — Telephone Encounter (Signed)
Rx was sent to pharmacy electronically. 

## 2012-11-26 ENCOUNTER — Ambulatory Visit (INDEPENDENT_AMBULATORY_CARE_PROVIDER_SITE_OTHER): Payer: Medicare Other | Admitting: Pharmacist Clinician (PhC)/ Clinical Pharmacy Specialist

## 2012-11-26 VITALS — BP 136/80 | HR 84

## 2012-11-26 DIAGNOSIS — Z7901 Long term (current) use of anticoagulants: Secondary | ICD-10-CM

## 2012-11-26 DIAGNOSIS — I4819 Other persistent atrial fibrillation: Secondary | ICD-10-CM

## 2012-11-26 DIAGNOSIS — I4891 Unspecified atrial fibrillation: Secondary | ICD-10-CM

## 2012-12-15 DIAGNOSIS — E669 Obesity, unspecified: Secondary | ICD-10-CM | POA: Diagnosis not present

## 2012-12-15 DIAGNOSIS — E785 Hyperlipidemia, unspecified: Secondary | ICD-10-CM | POA: Diagnosis not present

## 2012-12-15 DIAGNOSIS — Z79899 Other long term (current) drug therapy: Secondary | ICD-10-CM | POA: Diagnosis not present

## 2012-12-15 DIAGNOSIS — E039 Hypothyroidism, unspecified: Secondary | ICD-10-CM | POA: Diagnosis not present

## 2012-12-15 DIAGNOSIS — I10 Essential (primary) hypertension: Secondary | ICD-10-CM | POA: Diagnosis not present

## 2012-12-24 ENCOUNTER — Ambulatory Visit (INDEPENDENT_AMBULATORY_CARE_PROVIDER_SITE_OTHER): Payer: Medicare Other | Admitting: Pharmacist Clinician (PhC)/ Clinical Pharmacy Specialist

## 2012-12-24 VITALS — BP 138/84 | HR 84

## 2012-12-24 DIAGNOSIS — I4819 Other persistent atrial fibrillation: Secondary | ICD-10-CM

## 2012-12-24 DIAGNOSIS — Z7901 Long term (current) use of anticoagulants: Secondary | ICD-10-CM

## 2012-12-24 DIAGNOSIS — I4891 Unspecified atrial fibrillation: Secondary | ICD-10-CM | POA: Diagnosis not present

## 2012-12-24 LAB — POCT INR: INR: 2.9

## 2012-12-29 ENCOUNTER — Encounter: Payer: Self-pay | Admitting: Cardiovascular Disease

## 2013-01-23 ENCOUNTER — Ambulatory Visit (INDEPENDENT_AMBULATORY_CARE_PROVIDER_SITE_OTHER): Payer: Medicare Other | Admitting: Cardiology

## 2013-01-23 ENCOUNTER — Encounter: Payer: Self-pay | Admitting: Cardiology

## 2013-01-23 ENCOUNTER — Ambulatory Visit (INDEPENDENT_AMBULATORY_CARE_PROVIDER_SITE_OTHER): Payer: Medicare Other | Admitting: Pharmacist Clinician (PhC)/ Clinical Pharmacy Specialist

## 2013-01-23 VITALS — BP 112/60 | Ht 71.5 in | Wt 292.2 lb

## 2013-01-23 DIAGNOSIS — I82409 Acute embolism and thrombosis of unspecified deep veins of unspecified lower extremity: Secondary | ICD-10-CM | POA: Diagnosis not present

## 2013-01-23 DIAGNOSIS — E785 Hyperlipidemia, unspecified: Secondary | ICD-10-CM | POA: Diagnosis not present

## 2013-01-23 DIAGNOSIS — I82403 Acute embolism and thrombosis of unspecified deep veins of lower extremity, bilateral: Secondary | ICD-10-CM

## 2013-01-23 DIAGNOSIS — I4819 Other persistent atrial fibrillation: Secondary | ICD-10-CM

## 2013-01-23 DIAGNOSIS — Z7901 Long term (current) use of anticoagulants: Secondary | ICD-10-CM

## 2013-01-23 DIAGNOSIS — I4891 Unspecified atrial fibrillation: Secondary | ICD-10-CM

## 2013-01-23 LAB — POCT INR: INR: 2.6

## 2013-01-23 MED ORDER — FUROSEMIDE 20 MG PO TABS: 20.0000 mg | ORAL_TABLET | Freq: Every day | ORAL | Status: DC | PRN

## 2013-01-23 NOTE — Patient Instructions (Addendum)
Think about the nutritional class ,if you are interested contact the office  You have been referred to Dr Rayann Heman- dx afib  Continue with current medications.  Your physician wants you to follow-up in 6 month Dr Ellyn Hack.   You will receive a reminder letter in the mail two months in advance. If you don't receive a letter, please call our office to schedule the follow-up appointment.

## 2013-01-23 NOTE — Progress Notes (Signed)
Patient ID: Brett [name], male   DOB: Sep 28, 1945, 68 y.o.   MRN: [id number]  PCP: Salena Saner., MD  Clinic Note: Chief Complaint  Patient presents with  . 6 month visit    no chest paun ,no sob --the same,no edema   HPI: Brett [name] is a 68 y.o. the obese male with a PMH of  recurrent paroxysmal atrial fibrillation anticoagulated with Pradaxa and rhythm controlled with Multaq, hypertension and OSA on CPAP.  S/p elective cardioversion in May 2013.   This is his 2nd visit since his d/c from the hospital for TTP/Sepsis las summer.   Multaq & Pradaxa were d/c'd.  Now on rate control & warfarin.  Interval History: Brett [name] looks pretty much the same as he always does. He is wearing lower extremities compression stockings for chronic LE edema..  He actually says that he feeling pretty well. He is feels tired when he walks any distance & gets more short of breath with it which is different than before his hospitalization.  Not worse than in Aug 2014, but persistent.. He has no sensation of being in atrial fibrillation except when he is doing activities and notes the increased heart rate. At rest, he denies any palpitations, lightheadedness, dizziness or wooziness. No syncope or near-syncope the symptoms. Once again a rehabilitation he's been getting stronger everyday. He denies any chest pain or pressure during rest or exertion. No real dyspnea with exertion exertional as he really goes along distance.  No further bleeding episodes. No melena, hematochezia or hematuria. He is getting his INR levels checked today.  Past Medical History  Diagnosis Date  . Persistent atrial fibrillation 02/2011    Negative Myoview; Normal EF by Echo  . Obesity (BMI 30-39.9)   . Hypertension   . Dyslipidemia   . OSA on CPAP   . History of echocardiogram 02/07/2011    EF 50-60%; RV mildly dilated, LA moderately dilated; mild MR; mild-mod TR; RV systolic pressure elevated; mildly sclerotic AV  .  History of nuclear stress test 02/07/2011    lexiscan; mild ischemia in mid inferolateral & apical lateral regions; low risk scan  . Severe sepsis with acute organ dysfunction 5/30-6/12/2012    Prolonged hospitalization for presumed sepsis and septic shock compounded by coagulopathy thought to be TTP versus Pradaxa mediated. Temporarily on hemodialysis and plasmapheresis.  . DVT of lower extremity, bilateral 06/17/2012    Right CFV and popliteal vein, left C. it the only;  also interstitial fluid noted in both calfs  . History: TTP (thrombotic thrombocytopenic purpura) 06/11/2012  . History of: Bacteremia due to coagulase-negative Staphylococcus 06/13/2012    During hospitalization May 30 through 06/19/2012    Prior Cardiac Evaluation and Past Surgical History: Past Surgical History  Procedure Laterality Date  . Cardioversion  06/08/2011    Procedure: CARDIOVERSION;  Surgeon: Leonie Man, MD;  Location: [facility];  Service: Cardiovascular;  Laterality: N/A;  . Total knee arthroplasty      Left knee 2010, Right knee 2011    No Known Allergies  Current Outpatient Prescriptions  Medication Sig Dispense Refill  . acetaminophen (TYLENOL) 500 MG tablet Take 1,000 mg by mouth every morning.      . diltiazem (CARDIZEM CD) 240 MG 24 hr capsule Take 1 capsule (240 mg total) by mouth daily.      . diphenhydramine-acetaminophen (TYLENOL PM) 25-500 MG TABS Take 2 tablets by mouth at bedtime.      . fish oil-omega-3 fatty acids 1000  MG capsule Take 1 g by mouth daily.      . hydrALAZINE (APRESOLINE) 50 MG tablet Take 1 tablet (50 mg total) by mouth 2 (two) times daily.      Marland Kitchen loratadine (CLARITIN) 10 MG tablet Take 10 mg by mouth daily.      . metoprolol (LOPRESSOR) 100 MG tablet Take 100 mg by mouth 3 (three) times daily.      . mometasone (ELOCON) 0.1 % cream Apply 1 application topically as directed. Apply to affected area.      . Multiple Vitamin (MULITIVITAMIN WITH MINERALS) TABS Take 1 tablet by  mouth daily.      . NON FORMULARY at bedtime. CPAP      . pravastatin (PRAVACHOL) 80 MG tablet Take 80 mg by mouth daily.      . silver sulfADIAZINE (SILVADENE) 1 % cream APPLY TO AFFECTED AREA TWICE A DAY (BOTH LEGS 1TO 2 TIMES THEN WRAP ATER APPLICATION)  50 g  0  . warfarin (COUMADIN) 4 MG tablet Take 8 mg by mouth daily.      . furosemide (LASIX) 20 MG tablet Take 1 tablet (20 mg total) by mouth daily as needed.  90 tablet  3   No current facility-administered medications for this visit.   History   Social History Narrative   He is a divorced father of one. Prior to this hospitalization he was walking 5-10 minutes a day 4 times a week. He now is gradually building up his rehabilitation doing mild exercise daily.      He quit smoking in 1968 and rarely has alcohol.   ROS: A comprehensive Review of Systems - Negative except Pertinent symptoms noted above. He just has fatigue and exertional dyspnea. He has bilateral leg ulcers with chronic swelling. No fevers or chills or sweats since discharge. No change in GU or GI symptoms. Otherwise review of systems negative.  PHYSICAL EXAM BP 112/60  Ht 5' 11.5" (1.816 m)  Wt 292 lb 3.2 oz (132.541 kg)  BMI 40.19 kg/m2 General appearance: alert, cooperative, appears stated age, no distress, morbidly obese and Very pleasant mood and affect. A little more tired-appearing and usual. But looks fairly healthy post discharge. Neck: no adenopathy, no carotid bruit, no JVD and supple, symmetrical, trachea midline Lungs: clear to auscultation bilaterally, normal percussion bilaterally and Nonlabored, good air movement Heart: irregularly irregular rhythm, S1, S2 normal, no S3 or S4, no rub and No murmur Abdomen: soft, non-tender; bowel sounds normal; no masses,  no organomegaly and Obese Extremities:  2+ edema. He has compression stockings on the dressings for weeping wounds a brace he is appealing. No warmth to the touch nor edema. This is actually not  unusual for him to have a straight red or pressure wraps on his legs. and Chronic stasis changes noted Pulses: 2+ and symmetric That is on the wrists. Bilateral lower extremities are very difficult to palpate due to tube edeema and body habitus and compression stockings Skin: Venous stasis changes both legs. Neurologic: Grossly normal  [id number] today: Yes Rate: 76 , Rhythm: Atrial fibrillation controlled rate. Nonspecific ST-T changes  Recent Labs: None  ASSESSMENT / PLAN: He remains in atrial fibrillation but rate controlled.  It is a bit too soon post hospitalization to think that some of his fatigue and dyspnea on exertion Is not simply related to deconditioning. It is now minimal none since his discharge but it is clear that he is symptomatic from atrial fibrillation when it comes to doing  any activity. He did do exercise to lose weight, this may become an issue as far as A. fib rate control tolerance.   Persistent atrial fibrillation Now that he is far enough out from his hospitalization, it is clear did he is somewhat symptomatic from atrial fibrillation with decreased exercise tolerance and fatigue.  He is doing well with rate control on diltiazem and beta blocker.  Plan: Referred to Dr. Thompson Grayer from Electrophysiology as part of the new atrial fibrillation clinic for consideration of possible ablation versus antiarrhythmic agents.    DVT of lower extremity, bilateral Continues on warfarin now for a fair. DVTs in the setting of TTP should be resolved by now.  Dyslipidemia (high LDL; low HDL) On statin. Labs were fairly recently checked by PCP.  Long term (current) use of anticoagulants Currently on warfarin. I am leery of using a NOAC based on the reaction that could be simply related to Pradaxa. For now simply continue warfarin.  Severe obesity (BMI >= 40) He definitely needs to get into a dietary at this plan with both exercise and dietary modification. My impression  is that he probably had a lot some exertional dyspnea partially distantly related to him being in atrial fibrillation and obese. He is trying to use his CPAP.    Orders Placed This Encounter  Procedures  . Ambulatory referral to Cardiac Electrophysiology    Referral Priority:  Routine    Referral Type:  Consultation    Referral Reason:  Specialty Services Required    Referred to Provider:  Thompson Grayer, MD    Requested Specialty:  Cardiology    Number of Visits Requested:  1  . EKG 12-Lead    Followup: 6 months  DAVID W. Ellyn Hack, M.D., M.S. [facility] [address] [place], [place]  [address]  [phone] Pager # [phone]

## 2013-01-25 ENCOUNTER — Encounter: Payer: Self-pay | Admitting: Cardiology

## 2013-01-25 NOTE — Assessment & Plan Note (Signed)
Currently on warfarin. I am leery of using a NOAC based on the reaction that could be simply related to Pradaxa. For now simply continue warfarin.

## 2013-01-25 NOTE — Assessment & Plan Note (Addendum)
Now that he is far enough out from his hospitalization, it is clear did he is somewhat symptomatic from atrial fibrillation with decreased exercise tolerance and fatigue.  He is doing well with rate control on diltiazem and beta blocker.  Plan: Referred to Dr. Thompson Grayer from Electrophysiology as part of the new atrial fibrillation clinic for consideration of possible ablation versus antiarrhythmic agents.

## 2013-01-25 NOTE — Assessment & Plan Note (Signed)
He definitely needs to get into a dietary at this plan with both exercise and dietary modification. My impression is that he probably had a lot some exertional dyspnea partially distantly related to him being in atrial fibrillation and obese. He is trying to use his CPAP.

## 2013-01-25 NOTE — Assessment & Plan Note (Signed)
On statin. Labs were fairly recently checked by PCP.

## 2013-01-25 NOTE — Assessment & Plan Note (Signed)
Continues on warfarin now for a fair. DVTs in the setting of TTP should be resolved by now.

## 2013-02-19 ENCOUNTER — Ambulatory Visit (INDEPENDENT_AMBULATORY_CARE_PROVIDER_SITE_OTHER): Payer: Medicare Other | Admitting: Internal Medicine

## 2013-02-19 ENCOUNTER — Encounter: Payer: Self-pay | Admitting: Internal Medicine

## 2013-02-19 VITALS — BP 136/70 | HR 68 | Ht 71.5 in | Wt 289.0 lb

## 2013-02-19 DIAGNOSIS — I4891 Unspecified atrial fibrillation: Secondary | ICD-10-CM

## 2013-02-19 DIAGNOSIS — I4819 Other persistent atrial fibrillation: Secondary | ICD-10-CM

## 2013-02-19 NOTE — Patient Instructions (Signed)
Your physician recommends that you schedule a follow-up appointment in: 3 months with Dr Rayann Heman  Call Leonia Reader at [phone] if you decide to proceed with Tikosyn load

## 2013-02-19 NOTE — Progress Notes (Signed)
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 male with a h/o longstanding persistent atrial fibrillation who presents for EP consultation.  He reports being diagnosed with atrial fibrillation over a year ago upon routine physical exam.  His symptoms began rather insidiously and he suspects that he has had afib much longer.  He reports increasing frequency and duration of atrial fibrillation.  He was treated with pradaxa and multaq initially but developed TTP and these medicines were discontinued.  He has previously been cardioverted but did not maintain sinus rhythm. He reports occasional fatigue and decreased exercise tolerance with his afib.  He remains active however.  Today, he denies symptoms of palpitations, chest pain, shortness of breath, orthopnea, PND, lower extremity edema, dizziness, presyncope, syncope, or neurologic sequela. The patient is tolerating medications without difficulties and is otherwise without complaint today.   Past Medical History  Diagnosis Date  . Persistent atrial fibrillation 02/2011    Negative Myoview; Normal EF by Echo  . Obesity (BMI 30-39.9)   . Hypertension   . Dyslipidemia   . OSA on CPAP   . History of echocardiogram 02/07/2011    EF 50-60%; RV mildly dilated, LA moderately dilated; mild MR; mild-mod TR; RV systolic pressure elevated; mildly sclerotic AV  . History of nuclear stress test 02/07/2011    lexiscan; mild ischemia in mid inferolateral & apical lateral regions; low risk scan  . Severe sepsis with acute organ dysfunction 5/30-6/12/2012    Prolonged hospitalization for presumed sepsis and septic shock compounded by coagulopathy thought to be TTP versus Pradaxa mediated. Temporarily on hemodialysis and plasmapheresis.  . DVT of lower extremity, bilateral 06/17/2012    Right CFV and popliteal vein, left C. it the only;  also interstitial fluid noted in both calfs  . History: TTP  (thrombotic thrombocytopenic purpura) 06/11/2012  . History of: Bacteremia due to coagulase-negative Staphylococcus 06/13/2012    During hospitalization May 30 through 06/19/2012    Past Surgical History  Procedure Laterality Date  . Cardioversion  06/08/2011    Procedure: CARDIOVERSION;  Surgeon: Leonie Man, MD;  Location: [facility];  Service: Cardiovascular;  Laterality: N/A;  . Total knee arthroplasty      Left knee 2010, Right knee 2011    Current Outpatient Prescriptions  Medication Sig Dispense Refill  . acetaminophen (TYLENOL) 500 MG tablet Take 1,000 mg by mouth daily.       Marland Kitchen diltiazem (CARDIZEM CD) 240 MG 24 hr capsule Take 1 capsule (240 mg total) by mouth daily.      . diphenhydramine-acetaminophen (TYLENOL PM) 25-500 MG TABS Take 2 tablets by mouth at bedtime.      . fish oil-omega-3 fatty acids 1000 MG capsule Take 1 g by mouth daily.      . furosemide (LASIX) 20 MG tablet Take 1 tablet (20 mg total) by mouth daily as needed.  90 tablet  3  . loratadine (CLARITIN) 10 MG tablet Take 10 mg by mouth daily.      . metoprolol (LOPRESSOR) 100 MG tablet Take 100 mg by mouth 3 (three) times daily.      . mometasone (ELOCON) 0.1 % cream Apply 1 application topically as directed. Apply to affected area.      . Multiple Vitamin (MULITIVITAMIN WITH MINERALS) TABS Take 1 tablet by mouth daily.      . NON FORMULARY at bedtime. CPAP      . pravastatin (PRAVACHOL) 80  MG tablet Take 80 mg by mouth daily.      . silver sulfADIAZINE (SILVADENE) 1 % cream APPLY TO AFFECTED AREA TWICE A DAY (BOTH LEGS 1TO 2 TIMES THEN WRAP ATER APPLICATION)  50 g  0  . warfarin (COUMADIN) 4 MG tablet Take 8 mg by mouth daily.       No current facility-administered medications for this visit.    No Known Allergies  History   Social History  . Marital Status: Divorced    Spouse Name: N/A    Number of Children: 1  . Years of Education: N/A   Occupational History  . Not on file.   Social History Main  Topics  . Smoking status: Former Smoker    Types: Cigars  . Smokeless tobacco: Not on file  . Alcohol Use: No     Comment: couple beers a year  . Drug Use: No  . Sexual Activity: Not on file   Other Topics Concern  . Not on file   Social History Narrative   He is a divorced father of one. Prior to this hospitalization he was walking 5-10 minutes a day 4 times a week. He now is gradually building up his rehabilitation doing mild exercise daily.      He quit smoking in 1968 and rarely has alcohol.    Family History  Problem Relation Age of Onset  . Heart failure Mother 50  . Cancer Father     stomach    ROS- All systems are reviewed and negative except as per the HPI above  Physical Exam: Filed Vitals:   02/19/13 1614  BP: 136/70  Pulse: 68  Height: 5' 11.5" (1.816 m)  Weight: 289 lb (131.09 kg)    GEN- The patient is well appearing, alert and oriented x 3 today.   Head- normocephalic, atraumatic Eyes-  Sclera clear, conjunctiva pink Ears- hearing intact Oropharynx- clear Neck- supple, no JVP Lymph- no cervical lymphadenopathy Lungs- Clear to ausculation bilaterally, normal work of breathing Heart- irregular rate and rhythm, no murmurs, rubs or gallops, PMI not laterally displaced GI- soft, NT, ND, + BS Extremities- no clubbing, cyanosis, or edema MS- no significant deformity or atrophy Skin- no rash or lesion Psych- euthymic mood, full affect Neuro- strength and sensation are intact  EKG today afib with incomplete RBBB, Qtc 436  Assessment and Plan:  1. Longstanding persistent afib The patient has symptomatic afib.  He has failed medical therapy with multaq.  I suspect that our ability to maintain sinus rhythm long term is reduced.  Therapeutic strategies for afib including rate and rhythm control were discussed in detail with the patient today.  He is not certain that his symptoms warrant aggressive attempts at rhythm control.  I would advise tikosyn before  consideration of ablation.  Presently, he would like to further contemplate this option.  He will contact our office if he decides to try tikosyn.  No changes are made today.  2. Obesity Weight loss is advised  3. OSA Compliance with CPAP is encouraged   Return for afib follow-up in 3 months

## 2013-03-13 DIAGNOSIS — D485 Neoplasm of uncertain behavior of skin: Secondary | ICD-10-CM | POA: Diagnosis not present

## 2013-03-13 DIAGNOSIS — L259 Unspecified contact dermatitis, unspecified cause: Secondary | ICD-10-CM | POA: Diagnosis not present

## 2013-03-13 DIAGNOSIS — L570 Actinic keratosis: Secondary | ICD-10-CM | POA: Diagnosis not present

## 2013-03-17 DIAGNOSIS — I4891 Unspecified atrial fibrillation: Secondary | ICD-10-CM | POA: Diagnosis not present

## 2013-03-17 DIAGNOSIS — E785 Hyperlipidemia, unspecified: Secondary | ICD-10-CM | POA: Diagnosis not present

## 2013-03-17 DIAGNOSIS — E039 Hypothyroidism, unspecified: Secondary | ICD-10-CM | POA: Diagnosis not present

## 2013-03-17 DIAGNOSIS — I10 Essential (primary) hypertension: Secondary | ICD-10-CM | POA: Diagnosis not present

## 2013-03-30 DIAGNOSIS — C44320 Squamous cell carcinoma of skin of unspecified parts of face: Secondary | ICD-10-CM | POA: Diagnosis not present

## 2013-04-14 DIAGNOSIS — M25469 Effusion, unspecified knee: Secondary | ICD-10-CM | POA: Diagnosis not present

## 2013-04-14 DIAGNOSIS — M25069 Hemarthrosis, unspecified knee: Secondary | ICD-10-CM | POA: Diagnosis not present

## 2013-04-29 DIAGNOSIS — M25459 Effusion, unspecified hip: Secondary | ICD-10-CM | POA: Diagnosis not present

## 2013-05-06 ENCOUNTER — Other Ambulatory Visit: Payer: Self-pay | Admitting: Orthopedic Surgery

## 2013-05-06 ENCOUNTER — Telehealth: Payer: Self-pay | Admitting: Cardiology

## 2013-05-06 DIAGNOSIS — M25069 Hemarthrosis, unspecified knee: Secondary | ICD-10-CM | POA: Diagnosis not present

## 2013-05-06 DIAGNOSIS — Z79899 Other long term (current) drug therapy: Secondary | ICD-10-CM | POA: Diagnosis not present

## 2013-05-06 DIAGNOSIS — M2550 Pain in unspecified joint: Secondary | ICD-10-CM | POA: Diagnosis not present

## 2013-05-06 DIAGNOSIS — M25469 Effusion, unspecified knee: Secondary | ICD-10-CM | POA: Diagnosis not present

## 2013-05-06 DIAGNOSIS — M7989 Other specified soft tissue disorders: Secondary | ICD-10-CM | POA: Diagnosis not present

## 2013-05-06 LAB — BODY FLUID CELL COUNT WITH DIFFERENTIAL
Basophil: 0 %
EOS PCT: 0 %
LYMPHS PCT: 20 %
NEUTROS PCT: 71 %
Nucleated Cell Count: 2149 /mm3
OTHER CELLS BF: 0 %
OTHER MONONUCLEAR CELLS: 9 %

## 2013-05-06 LAB — PROTIME-INR: INR: 4.1 — AB (ref <=1.1)

## 2013-05-06 NOTE — Telephone Encounter (Signed)
Received call from Luis M. Cintron at Dr. Theodore Demark office. Patient had blood drawn off knee today and they decided to check an INR  INR 4.1  Dr. Rudene Christians advised that patient hold coumadin today and our office would contact with further instructions.   Last anticoag visit in Jan  Results will be faxed and placed in Kristin's box.. Will forward message to her to advise

## 2013-05-07 NOTE — Telephone Encounter (Signed)
Advised pt to hold dose again today, then resume at previous dose, scheduled appt for next Friday May 8 11:40am

## 2013-05-08 ENCOUNTER — Ambulatory Visit (INDEPENDENT_AMBULATORY_CARE_PROVIDER_SITE_OTHER): Payer: Medicare Other | Admitting: Pharmacist Clinician (PhC)/ Clinical Pharmacy Specialist

## 2013-05-08 DIAGNOSIS — I4819 Other persistent atrial fibrillation: Secondary | ICD-10-CM

## 2013-05-08 DIAGNOSIS — I4891 Unspecified atrial fibrillation: Secondary | ICD-10-CM

## 2013-05-08 DIAGNOSIS — Z7901 Long term (current) use of anticoagulants: Secondary | ICD-10-CM

## 2013-05-10 LAB — BODY FLUID CULTURE

## 2013-05-15 ENCOUNTER — Ambulatory Visit (INDEPENDENT_AMBULATORY_CARE_PROVIDER_SITE_OTHER): Payer: Medicare Other | Admitting: Pharmacist Clinician (PhC)/ Clinical Pharmacy Specialist

## 2013-05-15 DIAGNOSIS — Z7901 Long term (current) use of anticoagulants: Secondary | ICD-10-CM | POA: Diagnosis not present

## 2013-05-15 DIAGNOSIS — I4891 Unspecified atrial fibrillation: Secondary | ICD-10-CM | POA: Diagnosis not present

## 2013-05-15 DIAGNOSIS — I4819 Other persistent atrial fibrillation: Secondary | ICD-10-CM

## 2013-05-15 LAB — POCT INR: INR: 2.5

## 2013-05-27 ENCOUNTER — Ambulatory Visit (INDEPENDENT_AMBULATORY_CARE_PROVIDER_SITE_OTHER): Payer: Medicare Other | Admitting: Internal Medicine

## 2013-05-27 ENCOUNTER — Encounter: Payer: Self-pay | Admitting: Internal Medicine

## 2013-05-27 VITALS — BP 112/78 | HR 77 | Ht 71.5 in | Wt 283.0 lb

## 2013-05-27 DIAGNOSIS — G4733 Obstructive sleep apnea (adult) (pediatric): Secondary | ICD-10-CM

## 2013-05-27 DIAGNOSIS — I4819 Other persistent atrial fibrillation: Secondary | ICD-10-CM

## 2013-05-27 DIAGNOSIS — I4891 Unspecified atrial fibrillation: Secondary | ICD-10-CM | POA: Diagnosis not present

## 2013-05-27 NOTE — Patient Instructions (Signed)
Your physician recommends that you schedule a follow-up appointment as needed with Dr Rayann Heman and 3 months with Dr Ellyn Hack

## 2013-05-27 NOTE — Progress Notes (Signed)
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  He reports being diagnosed with atrial fibrillation over a year ago upon routine physical exam.  His symptoms began rather insidiously and he suspects that he has had afib much longer.  He reports increasing frequency and duration of atrial fibrillation.  He was treated with pradaxa and multaq initially but developed TTP and these medicines were discontinued.  He has previously been cardioverted but did not maintain sinus rhythm.  When I saw him last, we discussed tikosyn but he opted for rate control. He remains stable.  His primary limitation is knee pain.  Today, he denies symptoms of palpitations, chest pain, shortness of breath, orthopnea, PND, lower extremity edema, dizziness, presyncope, syncope, or neurologic sequela. The patient is tolerating medications without difficulties and is otherwise without complaint today.   Past Medical History  Diagnosis Date  . Persistent atrial fibrillation 02/2011    Negative Myoview; Normal EF by Echo  . Obesity (BMI 30-39.9)   . Hypertension   . Dyslipidemia   . OSA on CPAP   . History of echocardiogram 02/07/2011    EF 50-60%; RV mildly dilated, LA moderately dilated; mild MR; mild-mod TR; RV systolic pressure elevated; mildly sclerotic AV  . History of nuclear stress test 02/07/2011    lexiscan; mild ischemia in mid inferolateral & apical lateral regions; low risk scan  . Severe sepsis with acute organ dysfunction 5/30-6/12/2012    Prolonged hospitalization for presumed sepsis and septic shock compounded by coagulopathy thought to be TTP versus Pradaxa mediated. Temporarily on hemodialysis and plasmapheresis.  . DVT of lower extremity, bilateral 06/17/2012    Right CFV and popliteal vein, left C. it the only;  also interstitial fluid noted in  both calfs  . History: TTP (thrombotic thrombocytopenic purpura) 06/11/2012  . History of: Bacteremia due to coagulase-negative Staphylococcus 06/13/2012    During hospitalization May 30 through 06/19/2012    Past Surgical History  Procedure Laterality Date  . Cardioversion  06/08/2011    Procedure: CARDIOVERSION;  Surgeon: Leonie Man, MD;  Location: [facility];  Service: Cardiovascular;  Laterality: N/A;  . Total knee arthroplasty      Left knee 2010, Right knee 2011    Current Outpatient Prescriptions  Medication Sig Dispense Refill  . acetaminophen (TYLENOL) 500 MG tablet Take 1,000 mg by mouth daily.       Marland Kitchen diltiazem (CARDIZEM CD) 240 MG 24 hr capsule Take 1 capsule (240 mg total) by mouth daily.      . diphenhydramine-acetaminophen (TYLENOL PM) 25-500 MG TABS Take 2 tablets by mouth at bedtime.      . fish oil-omega-3 fatty acids 1000 MG capsule Take 1 g by mouth daily.      . furosemide (LASIX) 20 MG tablet Take 1 tablet (20 mg total) by mouth daily as needed.  90 tablet  3  . hydrALAZINE (APRESOLINE) 50 MG tablet Take 50 mg by mouth 2 (two) times daily.      Marland Kitchen loratadine (CLARITIN) 10 MG tablet Take 10 mg by mouth daily.      . metoprolol (LOPRESSOR) 100 MG tablet Take 100 mg by mouth 3 (three) times daily.      . Multiple Vitamin (MULITIVITAMIN WITH MINERALS) TABS Take 1 tablet by mouth daily.      . NON FORMULARY at bedtime. CPAP      .  pravastatin (PRAVACHOL) 80 MG tablet Take 80 mg by mouth daily.      . silver sulfADIAZINE (SILVADENE) 1 % cream APPLY TO AFFECTED AREA TWICE A DAY (BOTH LEGS 1TO 2 TIMES THEN WRAP ATER APPLICATION)  50 g  0  . traMADol (ULTRAM) 50 MG tablet Take 50 mg by mouth every 6 (six) hours as needed.      . warfarin (COUMADIN) 4 MG tablet Take 8 mg by mouth daily.       No current facility-administered medications for this visit.    No Known Allergies  History   Social History  . Marital Status: Divorced    Spouse Name: N/A    Number of Children: 1   . Years of Education: N/A   Occupational History  . Not on file.   Social History Main Topics  . Smoking status: Former Smoker    Types: Cigars  . Smokeless tobacco: Not on file  . Alcohol Use: No     Comment: couple beers a year  . Drug Use: No  . Sexual Activity: Not on file   Other Topics Concern  . Not on file   Social History Narrative   He is a divorced father of one. Prior to this hospitalization he was walking 5-10 minutes a day 4 times a week. He now is gradually building up his rehabilitation doing mild exercise daily.      He quit smoking in 1968 and rarely has alcohol.    Family History  Problem Relation Age of Onset  . Heart failure Mother 61  . Cancer Father     stomach    Physical Exam: Filed Vitals:   05/27/13 1109  BP: 112/78  Pulse: 77  Height: 5' 11.5" (1.816 m)  Weight: 283 lb (128.368 kg)    GEN- The patient is well appearing, alert and oriented x 3 today.   Head- normocephalic, atraumatic Eyes-  Sclera clear, conjunctiva pink Ears- hearing intact Oropharynx- clear Neck- supple, no JVP Lymph- no cervical lymphadenopathy Lungs- Clear to ausculation bilaterally, normal work of breathing Heart- irregular rate and rhythm, no murmurs, rubs or gallops, PMI not laterally displaced GI- soft, NT, ND, + BS Extremities- no clubbing, cyanosis, or edema MS- walks with a cane today Neuro- strength and sensation are intact  EKG today afib with incomplete RBBB   Assessment and Plan:  1. Longstanding persistent afib The patient has symptomatic afib.  He has failed medical therapy with multaq.  I suspect that our ability to maintain sinus rhythm long term is reduced.  He has opted for rate control which I think is reasonable.  He wishes to follow with Dr Ellyn Hack going forward. Should he decide to try rhythm control in the future with tikosyn then I would be happy to assist at that time.  No changes are made today.  2. Obesity Weight loss is  advised  3. OSA Compliance with CPAP is encouraged   Return for afib follow-up in 3 months with Dr Ellyn Hack I will see as needed going forward

## 2013-05-29 ENCOUNTER — Ambulatory Visit (INDEPENDENT_AMBULATORY_CARE_PROVIDER_SITE_OTHER): Payer: Medicare Other | Admitting: Pharmacist Clinician (PhC)/ Clinical Pharmacy Specialist

## 2013-05-29 DIAGNOSIS — Z7901 Long term (current) use of anticoagulants: Secondary | ICD-10-CM

## 2013-05-29 DIAGNOSIS — I4891 Unspecified atrial fibrillation: Secondary | ICD-10-CM

## 2013-05-29 DIAGNOSIS — I4819 Other persistent atrial fibrillation: Secondary | ICD-10-CM

## 2013-05-29 LAB — POCT INR: INR: 4.2

## 2013-06-12 ENCOUNTER — Ambulatory Visit (INDEPENDENT_AMBULATORY_CARE_PROVIDER_SITE_OTHER): Payer: Medicare Other | Admitting: Pharmacist Clinician (PhC)/ Clinical Pharmacy Specialist

## 2013-06-12 DIAGNOSIS — I4819 Other persistent atrial fibrillation: Secondary | ICD-10-CM

## 2013-06-12 DIAGNOSIS — Z7901 Long term (current) use of anticoagulants: Secondary | ICD-10-CM | POA: Diagnosis not present

## 2013-06-12 DIAGNOSIS — I4891 Unspecified atrial fibrillation: Secondary | ICD-10-CM

## 2013-06-12 LAB — POCT INR: INR: 3

## 2013-06-30 DIAGNOSIS — I10 Essential (primary) hypertension: Secondary | ICD-10-CM | POA: Diagnosis not present

## 2013-06-30 DIAGNOSIS — E039 Hypothyroidism, unspecified: Secondary | ICD-10-CM | POA: Diagnosis not present

## 2013-06-30 DIAGNOSIS — Z125 Encounter for screening for malignant neoplasm of prostate: Secondary | ICD-10-CM | POA: Diagnosis not present

## 2013-06-30 DIAGNOSIS — E785 Hyperlipidemia, unspecified: Secondary | ICD-10-CM | POA: Diagnosis not present

## 2013-06-30 DIAGNOSIS — Z79899 Other long term (current) drug therapy: Secondary | ICD-10-CM | POA: Diagnosis not present

## 2013-06-30 DIAGNOSIS — G4730 Sleep apnea, unspecified: Secondary | ICD-10-CM | POA: Diagnosis not present

## 2013-07-03 ENCOUNTER — Ambulatory Visit (INDEPENDENT_AMBULATORY_CARE_PROVIDER_SITE_OTHER): Payer: Medicare Other | Admitting: Pharmacist Clinician (PhC)/ Clinical Pharmacy Specialist

## 2013-07-03 DIAGNOSIS — Z7901 Long term (current) use of anticoagulants: Secondary | ICD-10-CM

## 2013-07-03 DIAGNOSIS — I4891 Unspecified atrial fibrillation: Secondary | ICD-10-CM | POA: Diagnosis not present

## 2013-07-03 DIAGNOSIS — I4819 Other persistent atrial fibrillation: Secondary | ICD-10-CM

## 2013-07-03 LAB — POCT INR: INR: 2.4

## 2013-07-28 DIAGNOSIS — I714 Abdominal aortic aneurysm, without rupture, unspecified: Secondary | ICD-10-CM | POA: Diagnosis not present

## 2013-07-28 DIAGNOSIS — I700 Atherosclerosis of aorta: Secondary | ICD-10-CM | POA: Diagnosis not present

## 2013-07-31 ENCOUNTER — Ambulatory Visit (INDEPENDENT_AMBULATORY_CARE_PROVIDER_SITE_OTHER): Payer: Medicare Other | Admitting: Pharmacist Clinician (PhC)/ Clinical Pharmacy Specialist

## 2013-07-31 DIAGNOSIS — I4891 Unspecified atrial fibrillation: Secondary | ICD-10-CM | POA: Diagnosis not present

## 2013-07-31 DIAGNOSIS — I4819 Other persistent atrial fibrillation: Secondary | ICD-10-CM

## 2013-07-31 DIAGNOSIS — Z7901 Long term (current) use of anticoagulants: Secondary | ICD-10-CM

## 2013-07-31 LAB — POCT INR: INR: 2.5

## 2013-08-24 ENCOUNTER — Ambulatory Visit (INDEPENDENT_AMBULATORY_CARE_PROVIDER_SITE_OTHER): Payer: Medicare Other | Admitting: Pharmacist Clinician (PhC)/ Clinical Pharmacy Specialist

## 2013-08-24 ENCOUNTER — Ambulatory Visit (INDEPENDENT_AMBULATORY_CARE_PROVIDER_SITE_OTHER): Payer: Medicare Other | Admitting: Cardiology

## 2013-08-24 ENCOUNTER — Encounter: Payer: Self-pay | Admitting: Cardiology

## 2013-08-24 VITALS — BP 128/64 | HR 76 | Ht 71.5 in | Wt 292.5 lb

## 2013-08-24 DIAGNOSIS — E785 Hyperlipidemia, unspecified: Secondary | ICD-10-CM

## 2013-08-24 DIAGNOSIS — I4891 Unspecified atrial fibrillation: Secondary | ICD-10-CM

## 2013-08-24 DIAGNOSIS — Z7901 Long term (current) use of anticoagulants: Secondary | ICD-10-CM

## 2013-08-24 DIAGNOSIS — I82409 Acute embolism and thrombosis of unspecified deep veins of unspecified lower extremity: Secondary | ICD-10-CM | POA: Diagnosis not present

## 2013-08-24 DIAGNOSIS — I8310 Varicose veins of unspecified lower extremity with inflammation: Secondary | ICD-10-CM | POA: Diagnosis not present

## 2013-08-24 DIAGNOSIS — I4819 Other persistent atrial fibrillation: Secondary | ICD-10-CM

## 2013-08-24 DIAGNOSIS — I82403 Acute embolism and thrombosis of unspecified deep veins of lower extremity, bilateral: Secondary | ICD-10-CM

## 2013-08-24 DIAGNOSIS — I872 Venous insufficiency (chronic) (peripheral): Secondary | ICD-10-CM

## 2013-08-24 LAB — POCT INR: INR: 2.4

## 2013-08-24 NOTE — Patient Instructions (Signed)
Your physician encouraged you to lose weight for better health.  CONTINUE WITH CURRENT MEDICATION.  Your physician wants you to follow-up in [place]. You will receive a reminder letter in the mail two months in advance. If you don't receive a letter, please call our office to schedule the follow-up appointment.

## 2013-08-25 NOTE — Assessment & Plan Note (Addendum)
Instead of losing weight he seems to put back on every pound that he would lose. Again counseled him on dietary modification and the need for increasing his exercise. Goal weight loss is roughly 2 pounds per month. May need to consider nutrition consultation.

## 2013-08-25 NOTE — Assessment & Plan Note (Signed)
Well-controlled rate with beta blocker and calcium channel blocker. He seems to be tolerating being in A. fib without too much the way of problems. Declined ablation. On warfarin now for anticoagulation due to concern for possible medication related TTP on Multaq & Pradaxa.

## 2013-08-25 NOTE — Assessment & Plan Note (Addendum)
On statin. Monitored by PCP. 

## 2013-08-25 NOTE — Assessment & Plan Note (Signed)
Continue with compression stockings. On standing dose of furosemide which he can adjust his edema gets worse.

## 2013-08-25 NOTE — Assessment & Plan Note (Signed)
He had bilateral DVTs in the setting of TTP. Continues to be on warfarin for A. fib anyway. This is certainly not helping his chronic lower extremity edema and venous stasis. No need to repeat Doppler ultrasounds as it will not change our management.

## 2013-08-25 NOTE — Assessment & Plan Note (Signed)
Monitored at [facility].

## 2013-08-25 NOTE — Progress Notes (Signed)
Patient ID: Brett [name], male   DOB: 07/29/1945, 68 y.o.   MRN: [id number]  PCP: Salena Saner., MD  Clinic Note: Chief Complaint  Patient presents with  . Follow-up    6 mo. denies chest pain. has dyspnea with heavy activity. edema in feet.   HPI: Brett [name] is a 68 y.o. the obese male with a PMH of  recurrent paroxysmal atrial fibrillation anticoagulated with Pradaxa and rhythm controlled with Multaq, hypertension and OSA on CPAP.  S/p elective cardioversion in May 2013.   This is his 2nd visit since his d/c from the hospital for TTP/Sepsis last summer.   Multaq & Pradaxa were d/c'd.  Now on rate control & warfarin.  He was referred to Dr. Rayann Heman who saw him in May to consider the possibility of atrial fibrillation ablation. The patient had decided to opt for optimization of rate control as long as his symptoms do not get worse.  Interval History: Brett [name] looks pretty much the same as he always does. He is wearing lower extremities compression stockings for chronic LE edema and doesn't really notice it much worse than usual. He still does his yard work and gardening without any difficulty. His left knee isn't giving him some problems which limit his walking.  He actually says that he feeling pretty well. He still feels tired when he walks any distance, but is probably more bothered by his knee then dyspnea.  He has no PND orthopnea. No resting or exertional chest pain/pressure. He has no sensation of being in atrial fibrillation except when he is doing activities and notes the increased heart rate. At rest, he denies any palpitations, lightheadedness, dizziness or wooziness. No syncope or near-syncope the symptoms. Once again a rehabilitation he's been getting stronger everyday. He denies any chest pain or pressure during rest or exertion. No real dyspnea with exertion exertional as he really goes along distance.  No further bleeding episodes. No melena, hematochezia,  epistaxis or hematuria. He is getting his INR levels checked today. No claudication.  Past Medical History  Diagnosis Date  . Persistent atrial fibrillation 02/2011    Negative Myoview; Normal EF by Echo  . Obesity (BMI 30-39.9)   . Hypertension   . Dyslipidemia   . OSA on CPAP   . History of echocardiogram 02/07/2011    EF 50-60%; RV mildly dilated, LA moderately dilated; mild MR; mild-mod TR; RV systolic pressure elevated; mildly sclerotic AV  . History of nuclear stress test 02/07/2011    lexiscan; mild ischemia in mid inferolateral & apical lateral regions; low risk scan  . Severe sepsis with acute organ dysfunction 5/30-6/12/2012    Prolonged hospitalization for presumed sepsis and septic shock compounded by coagulopathy thought to be TTP versus Pradaxa mediated. Temporarily on hemodialysis and plasmapheresis.  . DVT of lower extremity, bilateral 06/17/2012    Right CFV and popliteal vein, left C. it the only;  also interstitial fluid noted in both calfs  . History: TTP (thrombotic thrombocytopenic purpura) 06/11/2012  . History of: Bacteremia due to coagulase-negative Staphylococcus 06/13/2012    During hospitalization May 30 through 06/19/2012    Prior Cardiac Evaluation and Past Surgical History: Past Surgical History  Procedure Laterality Date  . Cardioversion  06/08/2011    Procedure: CARDIOVERSION;  Surgeon: Leonie Man, MD;  Location: [facility];  Service: Cardiovascular;  Laterality: N/A;  . Total knee arthroplasty      Left knee 2010, Right knee 2011    No Known Allergies  Current Outpatient Prescriptions  Medication Sig Dispense Refill  . acetaminophen (TYLENOL) 500 MG tablet Take 1,000 mg by mouth daily.       Marland Kitchen diltiazem (CARDIZEM CD) 240 MG 24 hr capsule Take 1 capsule (240 mg total) by mouth daily.      . diphenhydramine-acetaminophen (TYLENOL PM) 25-500 MG TABS Take 2 tablets by mouth at bedtime.      . fish oil-omega-3 fatty acids 1000 MG capsule Take 1 g by mouth  daily.      . furosemide (LASIX) 20 MG tablet Take 1 tablet (20 mg total) by mouth daily as needed.  90 tablet  3  . hydrALAZINE (APRESOLINE) 50 MG tablet Take 50 mg by mouth 2 (two) times daily.      Marland Kitchen loratadine (CLARITIN) 10 MG tablet Take 10 mg by mouth daily.      . metoprolol (LOPRESSOR) 100 MG tablet Take 100 mg by mouth 3 (three) times daily.      . Multiple Vitamin (MULITIVITAMIN WITH MINERALS) TABS Take 1 tablet by mouth daily.      . NON FORMULARY at bedtime. CPAP      . pravastatin (PRAVACHOL) 80 MG tablet Take 80 mg by mouth daily.      . silver sulfADIAZINE (SILVADENE) 1 % cream APPLY TO AFFECTED AREA TWICE A DAY (BOTH LEGS 1TO 2 TIMES THEN WRAP ATER APPLICATION)  50 g  0  . traMADol (ULTRAM) 50 MG tablet Take 50 mg by mouth every 6 (six) hours as needed.      . warfarin (COUMADIN) 4 MG tablet Take 8 mg by mouth daily.       No current facility-administered medications for this visit.   History   Social History Narrative   He is a divorced father of one. Prior to this hospitalization he was walking 5-10 minutes a day 4 times a week. He now is gradually building up his rehabilitation doing mild exercise daily.      He quit smoking in 1968 and rarely has alcohol.   ROS: A comprehensive Review of Systems - Negative except Pertinent symptoms noted above. Overall improved fatigue and exertional dyspnea. He has bilateral leg ulcers with chronic swelling that is stable. Ulcers appear to be relatively well healing. Chronic stasis changes noted. No fevers or chills or sweats since discharge. No change in GU or GI symptoms. Bilateral but left greater than right knee pain. Otherwise review of systems negative.  PHYSICAL EXAM BP 128/64  Pulse 76  Ht 5' 11.5" (1.816 m)  Wt 292 lb 8 oz (132.677 kg)  BMI 40.23 kg/m2 Wt Readings from Last 3 Encounters:  08/24/13 292 lb 8 oz (132.677 kg)  05/27/13 283 lb (128.368 kg)  02/19/13 289 lb (131.09 kg)   General appearance: alert,  cooperative, appears stated age, no distress, morbidly obese and Very pleasant mood and affect.  Other than obese, relatively acute appearing. Neck: no adenopathy, no carotid bruit, no JVD and supple, symmetrical, trachea midline Lungs: clear to auscultation bilaterally, normal percussion bilaterally and Nonlabored, good air movement Heart: Rate controlled irregularly irregular rhythm, S1, S2 normal, no S3 or S4, no rub and No murmur Abdomen: soft, non-tender; bowel sounds normal; no masses,  no organomegaly and Obese Extremities:  2+ edema. He has compression stockings but now without dressings in place for his chronic ulcers that appear to be well healing.. No warmth to the touch nor edema.  Chronic stasis changes noted Pulses: 2+ and symmetric on bilateral wrists. Bilateral  lower extremities are very difficult to palpate due to significant edema and body habitus and compression stockings Skin: Venous stasis changes both legs. Neurologic: Grossly normal  NMM:[id number] today: Yes Rate: 76 , Rhythm: Atrial fibrillation controlled rate. Nonspecific ST-T changes -- no notable change  Recent Labs: None available. followed by PCP. Last lipid panel reportedly in June  ASSESSMENT / PLAN: He remains in atrial fibrillation but rate controlled. Relatively asymptomatic with routine activity. He remains significantly obese, and has not done as well with his exercise and diet for weight loss.   Persistent atrial fibrillation Well-controlled rate with beta blocker and calcium channel blocker. He seems to be tolerating being in A. fib without too much the way of problems. Declined ablation. On warfarin now for anticoagulation due to concern for possible medication related TTP on Multaq & Pradaxa.  DVT of lower extremity, bilateral He had bilateral DVTs in the setting of TTP. Continues to be on warfarin for A. fib anyway. This is certainly not helping his chronic lower extremity edema and venous stasis. No  need to repeat Doppler ultrasounds as it will not change our management.    Chronic venous stasis dermatitis of both lower extremities Continue with compression stockings. On standing dose of furosemide which he can adjust his edema gets worse.  Dyslipidemia (high LDL; low HDL) On statin. Monitored by PCP.  Long term (current) use of anticoagulants Monitored at [facility].  Morbid obesity Instead of losing weight he seems to put back on every pound that he would lose. Again counseled him on dietary modification and the need for increasing his exercise. Goal weight loss is roughly 2 pounds per month. May need to consider nutrition consultation.    No orders of the defined types were placed in this encounter.    Followup: 12 months  DAVID W. Ellyn Hack, M.D., M.S. [facility] [address] [place], [place]  [id number]  [phone] Pager # [phone]

## 2013-09-21 ENCOUNTER — Ambulatory Visit (INDEPENDENT_AMBULATORY_CARE_PROVIDER_SITE_OTHER): Payer: Medicare Other | Admitting: Pharmacist Clinician (PhC)/ Clinical Pharmacy Specialist

## 2013-09-21 DIAGNOSIS — I4891 Unspecified atrial fibrillation: Secondary | ICD-10-CM

## 2013-09-21 DIAGNOSIS — Z7901 Long term (current) use of anticoagulants: Secondary | ICD-10-CM | POA: Diagnosis not present

## 2013-09-21 DIAGNOSIS — I4819 Other persistent atrial fibrillation: Secondary | ICD-10-CM

## 2013-09-21 LAB — POCT INR: INR: 3.5

## 2013-10-12 ENCOUNTER — Ambulatory Visit (INDEPENDENT_AMBULATORY_CARE_PROVIDER_SITE_OTHER): Payer: Medicare Other | Admitting: Pharmacist Clinician (PhC)/ Clinical Pharmacy Specialist

## 2013-10-12 DIAGNOSIS — I4819 Other persistent atrial fibrillation: Secondary | ICD-10-CM

## 2013-10-12 DIAGNOSIS — I481 Persistent atrial fibrillation: Secondary | ICD-10-CM

## 2013-10-12 DIAGNOSIS — I4891 Unspecified atrial fibrillation: Secondary | ICD-10-CM | POA: Diagnosis not present

## 2013-10-12 DIAGNOSIS — Z7901 Long term (current) use of anticoagulants: Secondary | ICD-10-CM | POA: Diagnosis not present

## 2013-10-12 LAB — POCT INR: INR: 3.1

## 2013-10-25 IMAGING — CR DG CHEST 1V PORT
1 series · 1 of 1 positions shown · non-contrast
Comparison: 1 day prior

CLINICAL DATA: Endotracheal tube placement.

PORTABLE CHEST - 1 VIEW

[AP]
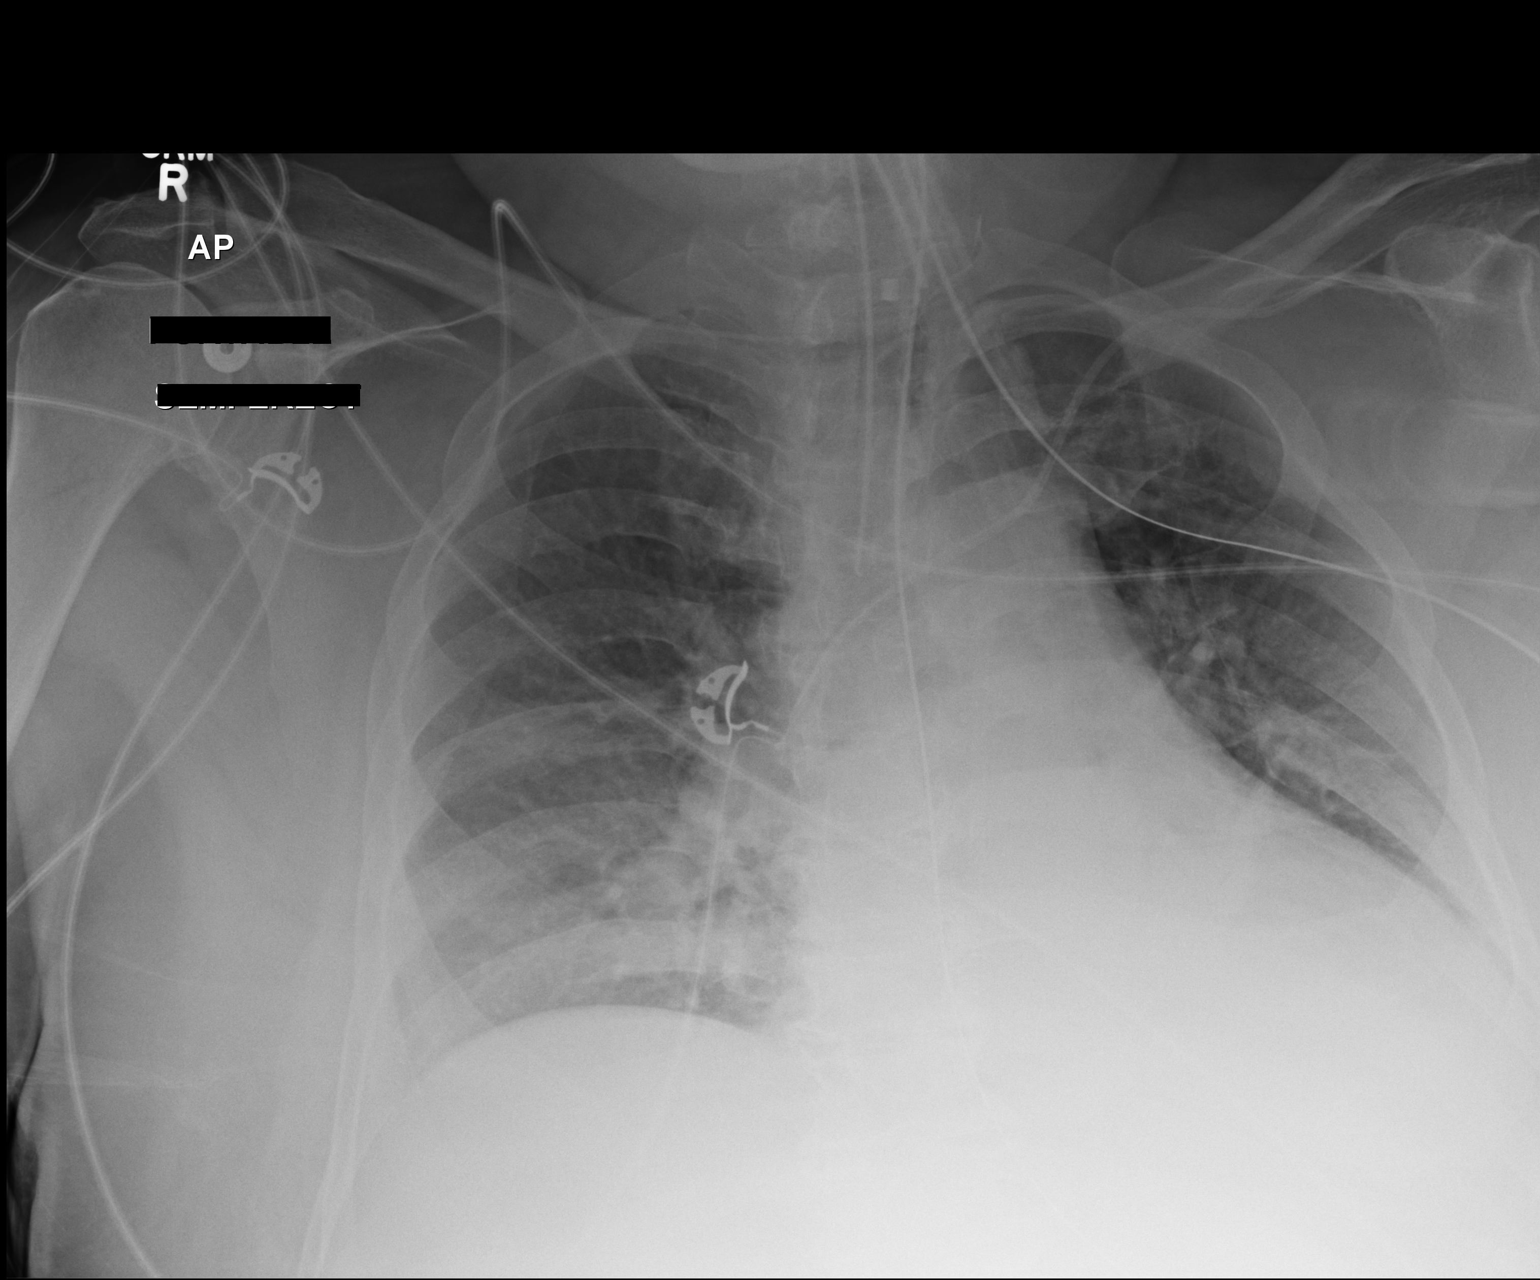

[1 of 1 positions shown; findings below may reference images not displayed]

FINDINGS: Endotracheal tube is unchanged in position.  3.9 cm above
carina.  Nasogastric extends beyond the  inferior aspect of the
film.

Left subclavian line unchanged.

Cardiomegaly accentuated by AP portable technique.  Cannot exclude
small left pleural effusion. No pneumothorax.  Low lung volumes
with resultant pulmonary interstitial prominence.  No congestive
failure.

Probable left base atelectasis.
IMPRESSION: No significant change since one day prior.

Low lung volumes with probable left base atelectasis.  Cannot
exclude small left pleural effusion.

## 2013-10-31 IMAGING — CR DG CHEST 1V PORT
1 series · 1 of 1 positions shown · non-contrast
Comparison: 06/12/2012

CLINICAL DATA: Post intubation

PORTABLE CHEST - 1 VIEW

[AP]
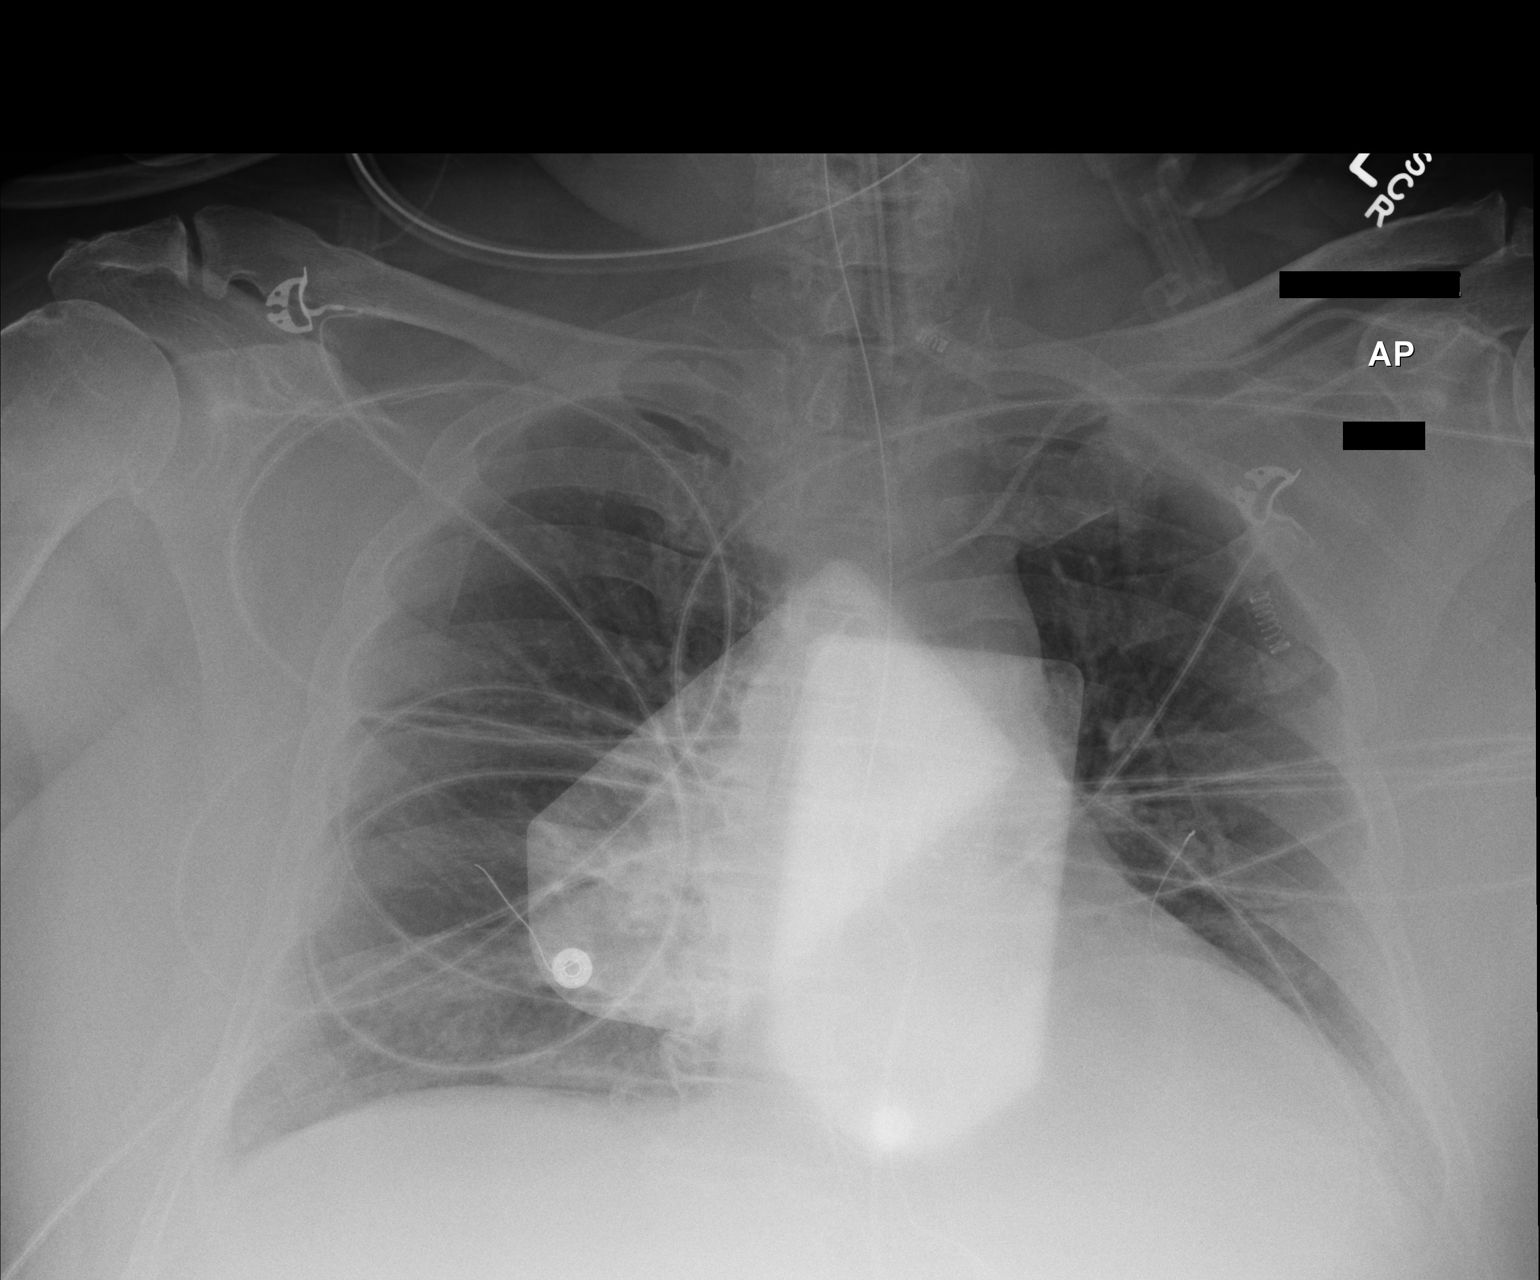

[1 of 1 positions shown; findings below may reference images not displayed]

FINDINGS: Endotracheal tube at the thoracic inlet.

Lungs are essentially clear.  No focal consolidation. No pleural
effusion or pneumothorax.

The heart is normal in size.

Enteric tube coursing below the diaphragm.

Defibrillator pads overlying the chest.
IMPRESSION: Endotracheal tube at the thoracic inlet.

## 2013-10-31 IMAGING — CR DG ABD PORTABLE 1V
1 series · 1 of 1 positions shown · non-contrast
Comparison: 06/07/2012

CLINICAL DATA: Orogastric tube placement.

PORTABLE ABDOMEN - 1 VIEW

[AP]
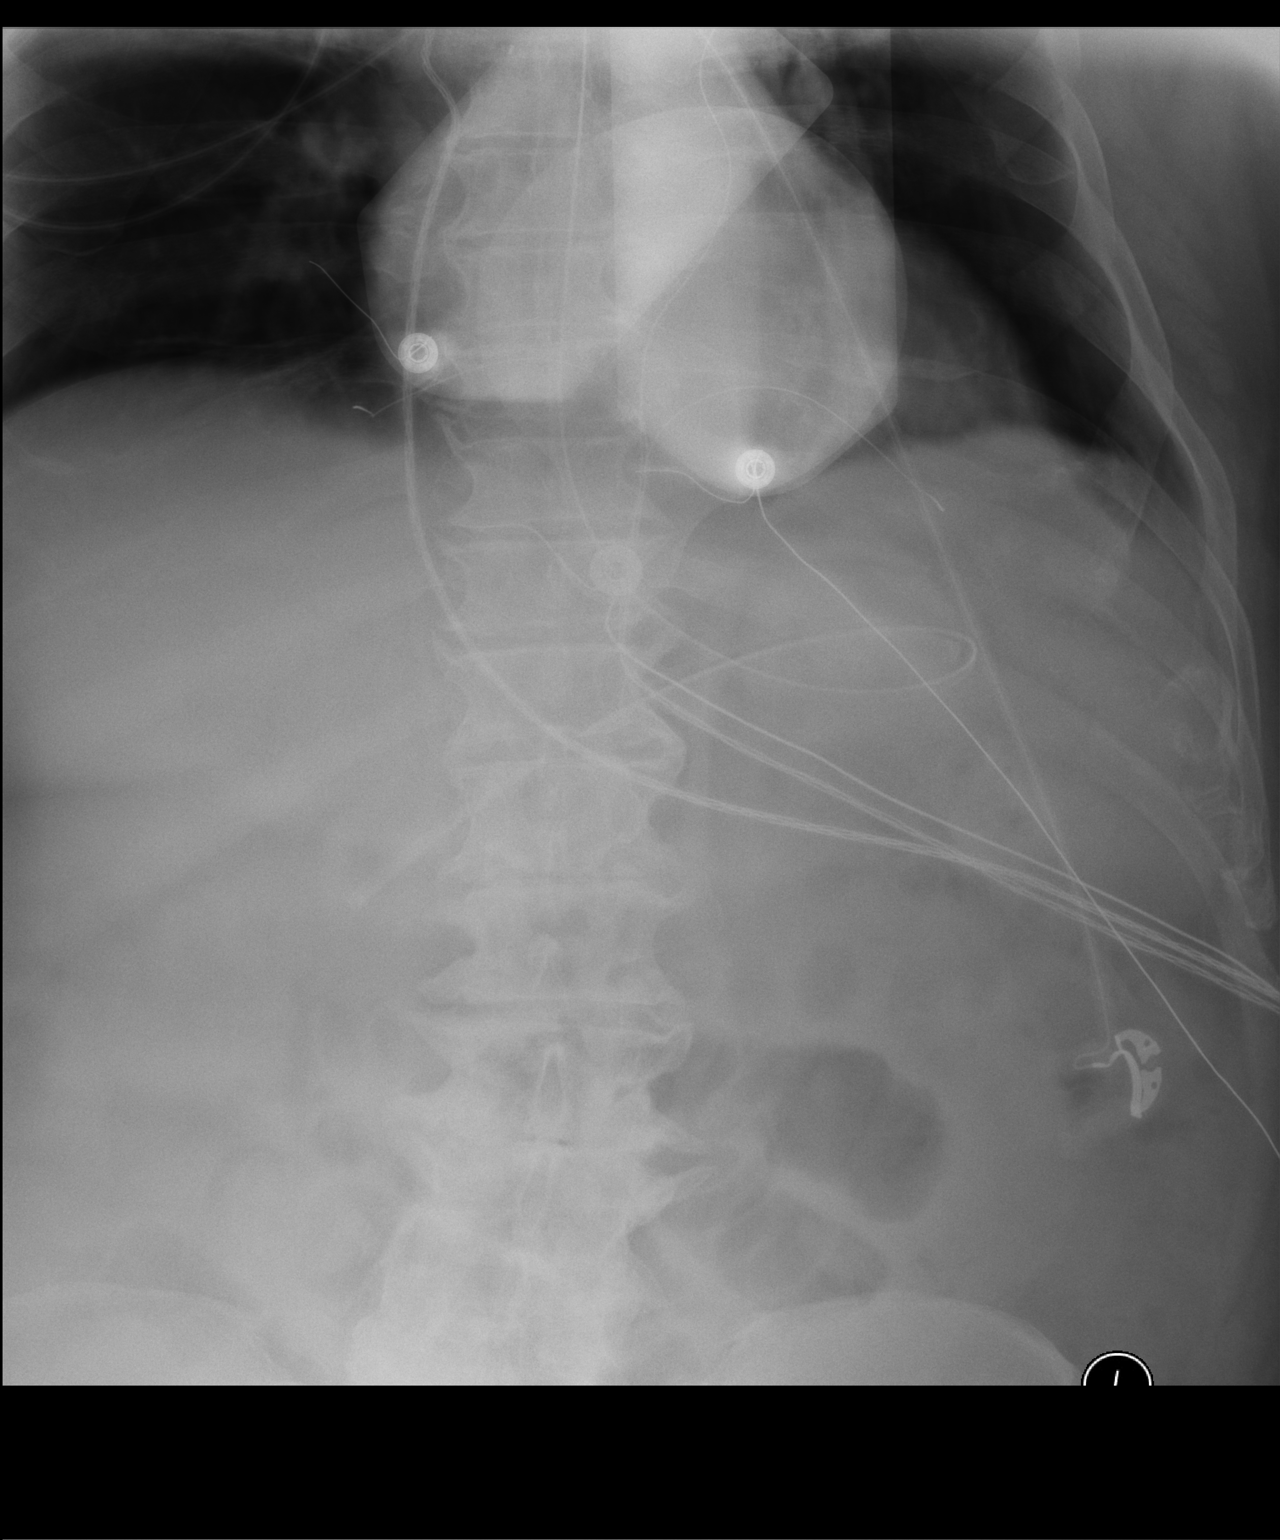

[1 of 1 positions shown; findings below may reference images not displayed]

FINDINGS: Gastric decompression tube is coiled in the proximal
stomach with the tip located in the region of the distal stomach.
Visualized bowel gas pattern is unremarkable.
IMPRESSION: Gastric decompression tube is coiled in the stomach.

## 2013-11-02 ENCOUNTER — Ambulatory Visit (INDEPENDENT_AMBULATORY_CARE_PROVIDER_SITE_OTHER): Payer: Medicare Other | Admitting: Pharmacist Clinician (PhC)/ Clinical Pharmacy Specialist

## 2013-11-02 DIAGNOSIS — I4891 Unspecified atrial fibrillation: Secondary | ICD-10-CM | POA: Diagnosis not present

## 2013-11-02 DIAGNOSIS — I481 Persistent atrial fibrillation: Secondary | ICD-10-CM | POA: Diagnosis not present

## 2013-11-02 DIAGNOSIS — Z7901 Long term (current) use of anticoagulants: Secondary | ICD-10-CM

## 2013-11-02 DIAGNOSIS — I4819 Other persistent atrial fibrillation: Secondary | ICD-10-CM

## 2013-11-02 LAB — POCT INR: INR: 2.6

## 2013-11-05 DIAGNOSIS — H109 Unspecified conjunctivitis: Secondary | ICD-10-CM | POA: Diagnosis not present

## 2013-11-05 DIAGNOSIS — H43813 Vitreous degeneration, bilateral: Secondary | ICD-10-CM | POA: Diagnosis not present

## 2013-11-05 DIAGNOSIS — H2513 Age-related nuclear cataract, bilateral: Secondary | ICD-10-CM | POA: Diagnosis not present

## 2013-11-05 DIAGNOSIS — H04123 Dry eye syndrome of bilateral lacrimal glands: Secondary | ICD-10-CM | POA: Diagnosis not present

## 2013-11-30 ENCOUNTER — Ambulatory Visit (INDEPENDENT_AMBULATORY_CARE_PROVIDER_SITE_OTHER): Payer: Medicare Other | Admitting: Pharmacist Clinician (PhC)/ Clinical Pharmacy Specialist

## 2013-11-30 DIAGNOSIS — Z7901 Long term (current) use of anticoagulants: Secondary | ICD-10-CM | POA: Diagnosis not present

## 2013-11-30 DIAGNOSIS — I4891 Unspecified atrial fibrillation: Secondary | ICD-10-CM | POA: Diagnosis not present

## 2013-11-30 DIAGNOSIS — I4819 Other persistent atrial fibrillation: Secondary | ICD-10-CM

## 2013-11-30 DIAGNOSIS — I481 Persistent atrial fibrillation: Secondary | ICD-10-CM

## 2013-11-30 LAB — POCT INR: INR: 3.4

## 2013-12-21 ENCOUNTER — Ambulatory Visit (INDEPENDENT_AMBULATORY_CARE_PROVIDER_SITE_OTHER): Payer: Medicare Other | Admitting: Pharmacist Clinician (PhC)/ Clinical Pharmacy Specialist

## 2013-12-21 DIAGNOSIS — Z7901 Long term (current) use of anticoagulants: Secondary | ICD-10-CM | POA: Diagnosis not present

## 2013-12-21 DIAGNOSIS — I481 Persistent atrial fibrillation: Secondary | ICD-10-CM | POA: Diagnosis not present

## 2013-12-21 DIAGNOSIS — I4819 Other persistent atrial fibrillation: Secondary | ICD-10-CM

## 2013-12-21 DIAGNOSIS — I4891 Unspecified atrial fibrillation: Secondary | ICD-10-CM | POA: Diagnosis not present

## 2013-12-21 LAB — POCT INR: INR: 2.4

## 2014-01-13 DIAGNOSIS — I10 Essential (primary) hypertension: Secondary | ICD-10-CM | POA: Diagnosis not present

## 2014-01-13 DIAGNOSIS — E668 Other obesity: Secondary | ICD-10-CM | POA: Diagnosis not present

## 2014-01-13 DIAGNOSIS — Z0000 Encounter for general adult medical examination without abnormal findings: Secondary | ICD-10-CM | POA: Diagnosis not present

## 2014-01-13 DIAGNOSIS — Z79899 Other long term (current) drug therapy: Secondary | ICD-10-CM | POA: Diagnosis not present

## 2014-01-13 DIAGNOSIS — Z125 Encounter for screening for malignant neoplasm of prostate: Secondary | ICD-10-CM | POA: Diagnosis not present

## 2014-01-13 DIAGNOSIS — E784 Other hyperlipidemia: Secondary | ICD-10-CM | POA: Diagnosis not present

## 2014-01-13 DIAGNOSIS — E039 Hypothyroidism, unspecified: Secondary | ICD-10-CM | POA: Diagnosis not present

## 2014-01-20 ENCOUNTER — Ambulatory Visit (INDEPENDENT_AMBULATORY_CARE_PROVIDER_SITE_OTHER): Payer: Medicare Other | Admitting: Pharmacist Clinician (PhC)/ Clinical Pharmacy Specialist

## 2014-01-20 DIAGNOSIS — Z7901 Long term (current) use of anticoagulants: Secondary | ICD-10-CM | POA: Diagnosis not present

## 2014-01-20 DIAGNOSIS — I481 Persistent atrial fibrillation: Secondary | ICD-10-CM | POA: Diagnosis not present

## 2014-01-20 DIAGNOSIS — I4891 Unspecified atrial fibrillation: Secondary | ICD-10-CM

## 2014-01-20 DIAGNOSIS — I4819 Other persistent atrial fibrillation: Secondary | ICD-10-CM

## 2014-01-20 LAB — POCT INR: INR: 2.2

## 2014-02-15 DIAGNOSIS — N400 Enlarged prostate without lower urinary tract symptoms: Secondary | ICD-10-CM | POA: Diagnosis not present

## 2014-02-15 DIAGNOSIS — R972 Elevated prostate specific antigen [PSA]: Secondary | ICD-10-CM | POA: Diagnosis not present

## 2014-02-17 ENCOUNTER — Ambulatory Visit (INDEPENDENT_AMBULATORY_CARE_PROVIDER_SITE_OTHER): Payer: Medicare Other | Admitting: Pharmacist Clinician (PhC)/ Clinical Pharmacy Specialist

## 2014-02-17 DIAGNOSIS — I4819 Other persistent atrial fibrillation: Secondary | ICD-10-CM

## 2014-02-17 DIAGNOSIS — I481 Persistent atrial fibrillation: Secondary | ICD-10-CM | POA: Diagnosis not present

## 2014-02-17 DIAGNOSIS — Z7901 Long term (current) use of anticoagulants: Secondary | ICD-10-CM

## 2014-02-17 DIAGNOSIS — I4891 Unspecified atrial fibrillation: Secondary | ICD-10-CM | POA: Diagnosis not present

## 2014-02-17 LAB — POCT INR: INR: 2.5

## 2014-03-31 ENCOUNTER — Ambulatory Visit (INDEPENDENT_AMBULATORY_CARE_PROVIDER_SITE_OTHER): Payer: Medicare Other | Admitting: Pharmacist Clinician (PhC)/ Clinical Pharmacy Specialist

## 2014-03-31 DIAGNOSIS — Z7901 Long term (current) use of anticoagulants: Secondary | ICD-10-CM

## 2014-03-31 DIAGNOSIS — I4891 Unspecified atrial fibrillation: Secondary | ICD-10-CM

## 2014-03-31 DIAGNOSIS — I4819 Other persistent atrial fibrillation: Secondary | ICD-10-CM

## 2014-03-31 DIAGNOSIS — I481 Persistent atrial fibrillation: Secondary | ICD-10-CM

## 2014-03-31 LAB — POCT INR: INR: 4.2

## 2014-04-19 ENCOUNTER — Ambulatory Visit: Payer: TRICARE For Life (TFL) | Admitting: Pharmacist Clinician (PhC)/ Clinical Pharmacy Specialist

## 2014-04-21 ENCOUNTER — Ambulatory Visit (INDEPENDENT_AMBULATORY_CARE_PROVIDER_SITE_OTHER): Payer: Medicare Other | Admitting: Pharmacist Clinician (PhC)/ Clinical Pharmacy Specialist

## 2014-04-21 DIAGNOSIS — I4891 Unspecified atrial fibrillation: Secondary | ICD-10-CM | POA: Diagnosis not present

## 2014-04-21 DIAGNOSIS — Z7901 Long term (current) use of anticoagulants: Secondary | ICD-10-CM | POA: Diagnosis not present

## 2014-04-21 DIAGNOSIS — I481 Persistent atrial fibrillation: Secondary | ICD-10-CM

## 2014-04-21 DIAGNOSIS — I4819 Other persistent atrial fibrillation: Secondary | ICD-10-CM

## 2014-04-21 LAB — POCT INR: INR: 1.9

## 2014-05-10 DIAGNOSIS — R972 Elevated prostate specific antigen [PSA]: Secondary | ICD-10-CM | POA: Diagnosis not present

## 2014-05-11 ENCOUNTER — Other Ambulatory Visit: Payer: Self-pay | Admitting: Cardiology

## 2014-05-17 DIAGNOSIS — R972 Elevated prostate specific antigen [PSA]: Secondary | ICD-10-CM | POA: Diagnosis not present

## 2014-05-17 DIAGNOSIS — N400 Enlarged prostate without lower urinary tract symptoms: Secondary | ICD-10-CM | POA: Diagnosis not present

## 2014-05-18 DIAGNOSIS — E785 Hyperlipidemia, unspecified: Secondary | ICD-10-CM | POA: Diagnosis not present

## 2014-05-18 DIAGNOSIS — Z79899 Other long term (current) drug therapy: Secondary | ICD-10-CM | POA: Diagnosis not present

## 2014-05-18 DIAGNOSIS — I10 Essential (primary) hypertension: Secondary | ICD-10-CM | POA: Diagnosis not present

## 2014-05-18 DIAGNOSIS — E119 Type 2 diabetes mellitus without complications: Secondary | ICD-10-CM | POA: Diagnosis not present

## 2014-05-18 DIAGNOSIS — E668 Other obesity: Secondary | ICD-10-CM | POA: Diagnosis not present

## 2014-05-19 ENCOUNTER — Ambulatory Visit (INDEPENDENT_AMBULATORY_CARE_PROVIDER_SITE_OTHER): Payer: Medicare Other | Admitting: Pharmacist Clinician (PhC)/ Clinical Pharmacy Specialist

## 2014-05-19 DIAGNOSIS — I4891 Unspecified atrial fibrillation: Secondary | ICD-10-CM | POA: Diagnosis not present

## 2014-05-19 DIAGNOSIS — Z7901 Long term (current) use of anticoagulants: Secondary | ICD-10-CM

## 2014-05-19 DIAGNOSIS — I481 Persistent atrial fibrillation: Secondary | ICD-10-CM

## 2014-05-19 DIAGNOSIS — I4819 Other persistent atrial fibrillation: Secondary | ICD-10-CM

## 2014-05-19 LAB — POCT INR: INR: 2.9

## 2014-06-17 ENCOUNTER — Ambulatory Visit (INDEPENDENT_AMBULATORY_CARE_PROVIDER_SITE_OTHER): Payer: Medicare Other | Admitting: Pharmacist Clinician (PhC)/ Clinical Pharmacy Specialist

## 2014-06-17 DIAGNOSIS — Z7901 Long term (current) use of anticoagulants: Secondary | ICD-10-CM

## 2014-06-17 DIAGNOSIS — I481 Persistent atrial fibrillation: Secondary | ICD-10-CM

## 2014-06-17 DIAGNOSIS — I4891 Unspecified atrial fibrillation: Secondary | ICD-10-CM | POA: Diagnosis not present

## 2014-06-17 DIAGNOSIS — I4819 Other persistent atrial fibrillation: Secondary | ICD-10-CM

## 2014-06-17 LAB — POCT INR: INR: 2.8

## 2014-07-14 ENCOUNTER — Ambulatory Visit (INDEPENDENT_AMBULATORY_CARE_PROVIDER_SITE_OTHER): Payer: Medicare Other | Admitting: Pharmacist Clinician (PhC)/ Clinical Pharmacy Specialist

## 2014-07-14 DIAGNOSIS — I4819 Other persistent atrial fibrillation: Secondary | ICD-10-CM

## 2014-07-14 DIAGNOSIS — I481 Persistent atrial fibrillation: Secondary | ICD-10-CM

## 2014-07-14 DIAGNOSIS — Z7901 Long term (current) use of anticoagulants: Secondary | ICD-10-CM

## 2014-07-14 DIAGNOSIS — I4891 Unspecified atrial fibrillation: Secondary | ICD-10-CM

## 2014-07-14 LAB — POCT INR: INR: 3.2

## 2014-07-21 ENCOUNTER — Other Ambulatory Visit: Payer: Self-pay | Admitting: Cardiology

## 2014-08-04 ENCOUNTER — Ambulatory Visit (INDEPENDENT_AMBULATORY_CARE_PROVIDER_SITE_OTHER): Payer: Medicare Other | Admitting: Pharmacist Clinician (PhC)/ Clinical Pharmacy Specialist

## 2014-08-04 DIAGNOSIS — Z7901 Long term (current) use of anticoagulants: Secondary | ICD-10-CM | POA: Diagnosis not present

## 2014-08-04 DIAGNOSIS — I4819 Other persistent atrial fibrillation: Secondary | ICD-10-CM

## 2014-08-04 DIAGNOSIS — I481 Persistent atrial fibrillation: Secondary | ICD-10-CM

## 2014-08-04 DIAGNOSIS — I4891 Unspecified atrial fibrillation: Secondary | ICD-10-CM | POA: Diagnosis not present

## 2014-08-04 LAB — POCT INR: INR: 2.3

## 2014-08-12 DIAGNOSIS — R972 Elevated prostate specific antigen [PSA]: Secondary | ICD-10-CM | POA: Diagnosis not present

## 2014-08-26 DIAGNOSIS — R972 Elevated prostate specific antigen [PSA]: Secondary | ICD-10-CM | POA: Diagnosis not present

## 2014-08-26 DIAGNOSIS — N400 Enlarged prostate without lower urinary tract symptoms: Secondary | ICD-10-CM | POA: Diagnosis not present

## 2014-09-01 ENCOUNTER — Ambulatory Visit (INDEPENDENT_AMBULATORY_CARE_PROVIDER_SITE_OTHER): Payer: Medicare Other | Admitting: Pharmacist Clinician (PhC)/ Clinical Pharmacy Specialist

## 2014-09-01 DIAGNOSIS — I481 Persistent atrial fibrillation: Secondary | ICD-10-CM | POA: Diagnosis not present

## 2014-09-01 DIAGNOSIS — I4891 Unspecified atrial fibrillation: Secondary | ICD-10-CM | POA: Diagnosis not present

## 2014-09-01 DIAGNOSIS — Z7901 Long term (current) use of anticoagulants: Secondary | ICD-10-CM | POA: Diagnosis not present

## 2014-09-01 DIAGNOSIS — I4819 Other persistent atrial fibrillation: Secondary | ICD-10-CM

## 2014-09-01 LAB — POCT INR: INR: 3.5

## 2014-09-15 ENCOUNTER — Ambulatory Visit (INDEPENDENT_AMBULATORY_CARE_PROVIDER_SITE_OTHER): Payer: Medicare Other | Admitting: Pharmacist Clinician (PhC)/ Clinical Pharmacy Specialist

## 2014-09-15 DIAGNOSIS — I4891 Unspecified atrial fibrillation: Secondary | ICD-10-CM | POA: Diagnosis not present

## 2014-09-15 DIAGNOSIS — I481 Persistent atrial fibrillation: Secondary | ICD-10-CM | POA: Diagnosis not present

## 2014-09-15 DIAGNOSIS — I4819 Other persistent atrial fibrillation: Secondary | ICD-10-CM

## 2014-09-15 DIAGNOSIS — Z7901 Long term (current) use of anticoagulants: Secondary | ICD-10-CM

## 2014-09-15 LAB — POCT INR: INR: 2.4

## 2014-10-04 ENCOUNTER — Ambulatory Visit (INDEPENDENT_AMBULATORY_CARE_PROVIDER_SITE_OTHER): Payer: Medicare Other | Admitting: Pharmacist Clinician (PhC)/ Clinical Pharmacy Specialist

## 2014-10-04 ENCOUNTER — Ambulatory Visit (INDEPENDENT_AMBULATORY_CARE_PROVIDER_SITE_OTHER): Payer: Medicare Other | Admitting: Cardiology

## 2014-10-04 VITALS — BP 110/70 | HR 54 | Ht 71.75 in | Wt 305.4 lb

## 2014-10-04 DIAGNOSIS — I4891 Unspecified atrial fibrillation: Secondary | ICD-10-CM

## 2014-10-04 DIAGNOSIS — E784 Other hyperlipidemia: Secondary | ICD-10-CM

## 2014-10-04 DIAGNOSIS — I4819 Other persistent atrial fibrillation: Secondary | ICD-10-CM

## 2014-10-04 DIAGNOSIS — E785 Hyperlipidemia, unspecified: Secondary | ICD-10-CM

## 2014-10-04 DIAGNOSIS — I872 Venous insufficiency (chronic) (peripheral): Secondary | ICD-10-CM

## 2014-10-04 DIAGNOSIS — I8311 Varicose veins of right lower extremity with inflammation: Secondary | ICD-10-CM

## 2014-10-04 DIAGNOSIS — I481 Persistent atrial fibrillation: Secondary | ICD-10-CM

## 2014-10-04 DIAGNOSIS — Z7901 Long term (current) use of anticoagulants: Secondary | ICD-10-CM

## 2014-10-04 DIAGNOSIS — I8312 Varicose veins of left lower extremity with inflammation: Secondary | ICD-10-CM

## 2014-10-04 LAB — POCT INR: INR: 3.2

## 2014-10-04 MED ORDER — FUROSEMIDE 20 MG PO TABS: 20.0000 mg | ORAL_TABLET | Freq: Two times a day (BID) | ORAL | Status: DC

## 2014-10-04 NOTE — Patient Instructions (Signed)
Take furosemide 20 mg daily  , an additional 20 mg  Furosemide daily if weight is 3 lbs heavier  And take extra tablet the day after if you have worsening swelling ,shortness of breathe    No other changes Your physician wants you to follow-up in 12 months with Dr Ellyn Hack. You will receive a reminder letter in the mail two months in advance. If you don't receive a letter, please call our office to schedule the follow-up appointment.

## 2014-10-04 NOTE — Progress Notes (Signed)
PCP: Pcp Not In System  Clinic Note: Chief Complaint  Patient presents with  . Annual Exam    pt states SOB on exertion and swelling in bilateral ankles and sometimes feet  . Atrial Fibrillation    HPI: Brett [name] is a 69 y.o. male with a PMH below who presents today for annual f/u of PAF - with Rx course complicated by TTP (thought to be related to anticoagulation med -- Pradaxa +/- Multaq). -- now on rate control & warfarin.   Brett [name] was last seen on Aug 24, 2013 -- doing relatively wel..  Recent Hospitalizations: none  Studies Reviewed: none  Interval History: For the most part, he is doing well.  Just on bad days notes worsening exertional dyspnea.  Also has off & on LE Edema. No resting dyspnea.  Can walk using walker or grocery cart, can do well.  If not, has trouble with back & hip pain & gets worn out.  Not really walking as much as he used to.  Mild Venous stasis sores, but small & infrequent.  No chest pain/ pressure with rest or exertion.  No PND, orthopnea with up & down edema. No palpitations, lightheadedness, dizziness, weakness or syncope/near syncope. No TIA/amaurosis fugax symptoms. No melena, hematochezia, hematuria, or epstaxis. No claudication.  ROS: A comprehensive was performed. Review of Systems  Constitutional: Negative for weight loss and malaise/fatigue.  HENT: Negative for nosebleeds.   Respiratory: Positive for shortness of breath.   Cardiovascular: Positive for leg swelling (Per history of present illness).  Musculoskeletal: Positive for joint pain (natural  aches and pains).  Skin: Positive for rash (sstill has mild punctate rash on arms and feet. But nothing like TTP).  Neurological: Positive for dizziness (Occasionally, positional).  Endo/Heme/Allergies: Does not bruise/bleed easily.  All other systems reviewed and are negative.   Past Medical History  Diagnosis Date  . Persistent atrial fibrillation 02/2011    Negative  Myoview; Normal EF by Echo  . Obesity (BMI 30-39.9)   . Hypertension   . Dyslipidemia   . OSA on CPAP   . History of echocardiogram 02/07/2011    EF 50-60%; RV mildly dilated, LA moderately dilated; mild MR; mild-mod TR; RV systolic pressure elevated; mildly sclerotic AV  . History of nuclear stress test 02/07/2011    lexiscan; mild ischemia in mid inferolateral & apical lateral regions; low risk scan  . Severe sepsis with acute organ dysfunction 5/30-6/12/2012    Prolonged hospitalization for presumed sepsis and septic shock compounded by coagulopathy thought to be TTP versus Pradaxa mediated. Temporarily on hemodialysis and plasmapheresis.  . DVT of lower extremity, bilateral 06/17/2012    Right CFV and popliteal vein, left C. it the only;  also interstitial fluid noted in both calfs  . History: TTP (thrombotic thrombocytopenic purpura) 06/11/2012  . History of: Bacteremia due to coagulase-negative Staphylococcus 06/13/2012    During hospitalization May 30 through 06/19/2012    Past Surgical History  Procedure Laterality Date  . Cardioversion  06/08/2011    Procedure: CARDIOVERSION;  Surgeon: Leonie Man, MD;  Location: [facility];  Service: Cardiovascular;  Laterality: N/A;  . Total knee arthroplasty      Left knee 2010, Right knee 2011   Prior to Admission medications   Medication Sig Start Date End Date Taking? Authorizing Provider  acetaminophen (TYLENOL) 500 MG tablet Take 1,000 mg by mouth daily.    Yes Historical Provider, MD  diltiazem (CARDIZEM CD) 240 MG 24 hr  capsule Take 1 capsule (240 mg total) by mouth daily. 06/19/12  Yes Nita Sells, MD  diphenhydramine-acetaminophen (TYLENOL PM) 25-500 MG TABS Take 2 tablets by mouth at bedtime.   Yes Historical Provider, MD  fish oil-omega-3 fatty acids 1000 MG capsule Take 1 g by mouth daily.   Yes Historical Provider, MD  furosemide (LASIX) 20 MG tablet TAKE 1 TABLET DAILY AS NEEDED 07/21/14  Yes Leonie Man, MD  hydrALAZINE  (APRESOLINE) 50 MG tablet Take 50 mg by mouth 2 (two) times daily.   Yes Historical Provider, MD  loratadine (CLARITIN) 10 MG tablet Take 10 mg by mouth daily.   Yes Historical Provider, MD  metoprolol (LOPRESSOR) 100 MG tablet Take 100 mg by mouth 3 (three) times daily. 06/19/12  Yes Nita Sells, MD  Multiple Vitamin (MULITIVITAMIN WITH MINERALS) TABS Take 1 tablet by mouth daily.   Yes Historical Provider, MD  NON FORMULARY at bedtime. CPAP   Yes Historical Provider, MD  pravastatin (PRAVACHOL) 80 MG tablet Take 80 mg by mouth daily.   Yes Historical Provider, MD  silver sulfADIAZINE (SILVADENE) 1 % cream APPLY TO AFFECTED AREA TWICE A DAY (BOTH LEGS 1TO 2 TIMES THEN WRAP ATER APPLICATION) 56/4/33  Yes Leonie Man, MD  traMADol (ULTRAM) 50 MG tablet Take 50 mg by mouth every 6 (six) hours as needed.   Yes Historical Provider, MD  warfarin (COUMADIN) 4 MG tablet Take 8 mg by mouth daily.   Yes Historical Provider, MD   No Known Allergies   Social History   Social History  . Marital Status: Divorced    Spouse Name: N/A  . Number of Children: 1  . Years of Education: N/A   Social History Main Topics  . Smoking status: Former Smoker    Types: Cigars  . Smokeless tobacco: None  . Alcohol Use: No     Comment: couple beers a year  . Drug Use: No  . Sexual Activity: Not Asked   Other Topics Concern  . None   Social History Narrative   He is a divorced father of one. Prior to this hospitalization he was walking 5-10 minutes a day 4 times a week. He now is gradually building up his rehabilitation doing mild exercise daily.      He quit smoking in 1968 and rarely has alcohol.   Family History  Problem Relation Age of Onset  . Heart failure Mother 58  . Cancer Father     stomach    Wt Readings from Last 3 Encounters:  10/04/14 305 lb 6.4 oz (138.529 kg)  08/24/13 292 lb 8 oz (132.677 kg)  05/27/13 283 lb (128.368 kg)    PHYSICAL EXAM BP 110/70 mmHg  Pulse 54  Ht  5' 11.75" (1.822 m)  Wt 305 lb 6.4 oz (138.529 kg)  BMI 41.73 kg/m2 General appearance: alert, cooperative, appears stated age, no distress, morbidly obese and Very pleasant mood and affect. Other than obese, relatively acute appearing. Neck: no adenopathy, no carotid bruit, no JVD and supple, symmetrical, trachea midline Lungs: clear to auscultation bilaterally, normal percussion bilaterally and Nonlabored, good air movement Heart: Rate controlled irregularly irregular rhythm, S1, S2 normal, no S3 or S4, no rub and No murmur Abdomen: soft, non-tender; bowel sounds normal; no masses, no organomegaly and Obese Extremities: 2+ edema. He has compression stockings but now without dressings in place for his chronic ulcers that appear to be well healing.. No warmth to the touch nor edema. Chronic stasis changes  noted Pulses: 2+ and symmetric on bilateral wrists. Bilateral lower extremities are very difficult to palpate due to significant edema and body habitus and compression stockings Skin: Venous stasis changes both legs. Neurologic: Grossly normal    Adult ECG Report  Rate: 54 ;  Rhythm: atrial fibrillation and Slow ventricular rate. Nonspecific ST and T wave changes.;   Narrative Interpretation: stable EKG   Other studies Reviewed: Additional studies/ records that were reviewed today include:  Recent Labs: none available    ASSESSMENT / PLAN: Problem List Items Addressed This Visit    Severe obesity (BMI >= 40) (Chronic)    He would definitely benefit from dietary counseling. He didn't seem very receptive to history right now. Would continue to discuss options with him. He uses CPAP as much as he can.      Relevant Orders   EKG 12-Lead (Completed)   Persistent atrial fibrillation - Primary (Chronic)    Basically we controlled with 3 times a day metoprolol dosing and diltiazem. If anything maybe a little bit slow response. Plan is to continue with the break and throw as he is  relatively asymptomatic. On warfarin for anticoagulation based on history of TTP with the [facility]      Relevant Medications   furosemide (LASIX) 20 MG tablet   Other Relevant Orders   EKG 12-Lead (Completed)   Long term current use of anticoagulant therapy (Chronic)    On warfarin. Monitored here at our clinic      Relevant Orders   EKG 12-Lead (Completed)   Dyslipidemia (high LDL; low HDL) (Chronic)   Relevant Orders   EKG 12-Lead (Completed)   Chronic venous stasis dermatitis of both lower extremities (Chronic)    History ablation procedure. Risks and pressure stockings. Currently a standing dose of Lasix which I instructed him to titrate up or down based on his edema and/or orthopnea.      Relevant Orders   EKG 12-Lead (Completed)      Current medicines are reviewed at length with the patient today. (+/- concerns) none The following changes have been made: increase Lasix to daily with additional PM dose PRN DOE, edema or wgt gain > 3 lb  PATIENT INSTRUCTIONS: Take furosemide 20 mg daily  , an additional 20 mg  Furosemide daily if weight is 3 lbs heavier  And take extra tablet the day after if you have worsening swelling ,shortness of breathe    No other changes Your physician wants you to follow-up in 12 months with Dr Ellyn Hack.   Studies Ordered:   Orders Placed This Encounter  Procedures  . EKG 12-Lead      Leonie Man, M.D., M.S. Interventional Cardiologist   Pager # [phone]

## 2014-10-06 ENCOUNTER — Encounter: Payer: Self-pay | Admitting: Cardiology

## 2014-10-06 NOTE — Assessment & Plan Note (Signed)
On warfarin. Monitored here at our clinic

## 2014-10-06 NOTE — Assessment & Plan Note (Signed)
Basically we controlled with 3 times a day metoprolol dosing and diltiazem. If anything maybe a little bit slow response. Plan is to continue with the break and throw as he is relatively asymptomatic. On warfarin for anticoagulation based on history of TTP with the [facility]

## 2014-10-06 NOTE — Assessment & Plan Note (Signed)
He would definitely benefit from dietary counseling. He didn't seem very receptive to history right now. Would continue to discuss options with him. He uses CPAP as much as he can.

## 2014-10-06 NOTE — Assessment & Plan Note (Signed)
History ablation procedure. Risks and pressure stockings. Currently a standing dose of Lasix which I instructed him to titrate up or down based on his edema and/or orthopnea.

## 2014-10-20 ENCOUNTER — Ambulatory Visit (INDEPENDENT_AMBULATORY_CARE_PROVIDER_SITE_OTHER): Payer: Medicare Other | Admitting: Pharmacist

## 2014-10-20 DIAGNOSIS — Z7901 Long term (current) use of anticoagulants: Secondary | ICD-10-CM

## 2014-10-20 DIAGNOSIS — I4891 Unspecified atrial fibrillation: Secondary | ICD-10-CM

## 2014-10-20 DIAGNOSIS — I481 Persistent atrial fibrillation: Secondary | ICD-10-CM

## 2014-10-20 DIAGNOSIS — I4819 Other persistent atrial fibrillation: Secondary | ICD-10-CM

## 2014-10-20 LAB — POCT INR: INR: 2.7

## 2014-11-09 DIAGNOSIS — E785 Hyperlipidemia, unspecified: Secondary | ICD-10-CM | POA: Diagnosis not present

## 2014-11-09 DIAGNOSIS — I10 Essential (primary) hypertension: Secondary | ICD-10-CM | POA: Diagnosis not present

## 2014-11-10 ENCOUNTER — Ambulatory Visit (INDEPENDENT_AMBULATORY_CARE_PROVIDER_SITE_OTHER): Payer: Medicare Other | Admitting: Pharmacist Clinician (PhC)/ Clinical Pharmacy Specialist

## 2014-11-10 DIAGNOSIS — Z7901 Long term (current) use of anticoagulants: Secondary | ICD-10-CM

## 2014-11-10 DIAGNOSIS — I481 Persistent atrial fibrillation: Secondary | ICD-10-CM | POA: Diagnosis not present

## 2014-11-10 DIAGNOSIS — I4891 Unspecified atrial fibrillation: Secondary | ICD-10-CM

## 2014-11-10 DIAGNOSIS — I4819 Other persistent atrial fibrillation: Secondary | ICD-10-CM

## 2014-11-10 LAB — POCT INR: INR: 2.6

## 2014-11-12 ENCOUNTER — Encounter: Payer: Self-pay | Admitting: Cardiology

## 2014-12-08 ENCOUNTER — Ambulatory Visit (INDEPENDENT_AMBULATORY_CARE_PROVIDER_SITE_OTHER): Payer: Medicare Other | Admitting: Pharmacist Clinician (PhC)/ Clinical Pharmacy Specialist

## 2014-12-08 DIAGNOSIS — Z7901 Long term (current) use of anticoagulants: Secondary | ICD-10-CM | POA: Diagnosis not present

## 2014-12-08 DIAGNOSIS — I4891 Unspecified atrial fibrillation: Secondary | ICD-10-CM

## 2014-12-08 DIAGNOSIS — I481 Persistent atrial fibrillation: Secondary | ICD-10-CM | POA: Diagnosis not present

## 2014-12-08 DIAGNOSIS — I4819 Other persistent atrial fibrillation: Secondary | ICD-10-CM

## 2014-12-08 LAB — POCT INR: INR: 1.9

## 2015-01-05 ENCOUNTER — Ambulatory Visit (INDEPENDENT_AMBULATORY_CARE_PROVIDER_SITE_OTHER): Payer: Medicare Other | Admitting: Pharmacist Clinician (PhC)/ Clinical Pharmacy Specialist

## 2015-01-05 DIAGNOSIS — I4891 Unspecified atrial fibrillation: Secondary | ICD-10-CM | POA: Diagnosis not present

## 2015-01-05 DIAGNOSIS — Z7901 Long term (current) use of anticoagulants: Secondary | ICD-10-CM | POA: Diagnosis not present

## 2015-01-05 DIAGNOSIS — I481 Persistent atrial fibrillation: Secondary | ICD-10-CM | POA: Diagnosis not present

## 2015-01-05 DIAGNOSIS — I4819 Other persistent atrial fibrillation: Secondary | ICD-10-CM

## 2015-01-05 LAB — POCT INR: INR: 2.5

## 2015-02-04 ENCOUNTER — Ambulatory Visit (INDEPENDENT_AMBULATORY_CARE_PROVIDER_SITE_OTHER): Payer: Medicare Other | Admitting: Pharmacist Clinician (PhC)/ Clinical Pharmacy Specialist

## 2015-02-04 DIAGNOSIS — I481 Persistent atrial fibrillation: Secondary | ICD-10-CM

## 2015-02-04 DIAGNOSIS — Z7901 Long term (current) use of anticoagulants: Secondary | ICD-10-CM | POA: Diagnosis not present

## 2015-02-04 DIAGNOSIS — I4891 Unspecified atrial fibrillation: Secondary | ICD-10-CM

## 2015-02-04 DIAGNOSIS — I4819 Other persistent atrial fibrillation: Secondary | ICD-10-CM

## 2015-02-04 LAB — POCT INR: INR: 2.6

## 2015-03-18 ENCOUNTER — Ambulatory Visit (INDEPENDENT_AMBULATORY_CARE_PROVIDER_SITE_OTHER): Payer: Medicare Other | Admitting: Pharmacist Clinician (PhC)/ Clinical Pharmacy Specialist

## 2015-03-18 DIAGNOSIS — I481 Persistent atrial fibrillation: Secondary | ICD-10-CM

## 2015-03-18 DIAGNOSIS — I4891 Unspecified atrial fibrillation: Secondary | ICD-10-CM | POA: Diagnosis not present

## 2015-03-18 DIAGNOSIS — Z7901 Long term (current) use of anticoagulants: Secondary | ICD-10-CM

## 2015-03-18 DIAGNOSIS — I4819 Other persistent atrial fibrillation: Secondary | ICD-10-CM

## 2015-03-18 LAB — POCT INR: INR: 2.6

## 2015-04-29 ENCOUNTER — Ambulatory Visit (INDEPENDENT_AMBULATORY_CARE_PROVIDER_SITE_OTHER): Payer: Medicare Other | Admitting: Pharmacist Clinician (PhC)/ Clinical Pharmacy Specialist

## 2015-04-29 DIAGNOSIS — I4891 Unspecified atrial fibrillation: Secondary | ICD-10-CM | POA: Diagnosis not present

## 2015-04-29 DIAGNOSIS — I4819 Other persistent atrial fibrillation: Secondary | ICD-10-CM

## 2015-04-29 DIAGNOSIS — I481 Persistent atrial fibrillation: Secondary | ICD-10-CM

## 2015-04-29 DIAGNOSIS — Z7901 Long term (current) use of anticoagulants: Secondary | ICD-10-CM

## 2015-04-29 LAB — POCT INR: INR: 2.8

## 2015-05-11 DIAGNOSIS — I4891 Unspecified atrial fibrillation: Secondary | ICD-10-CM | POA: Diagnosis not present

## 2015-05-11 DIAGNOSIS — I10 Essential (primary) hypertension: Secondary | ICD-10-CM | POA: Diagnosis not present

## 2015-05-11 DIAGNOSIS — E785 Hyperlipidemia, unspecified: Secondary | ICD-10-CM | POA: Diagnosis not present

## 2015-05-11 DIAGNOSIS — R7309 Other abnormal glucose: Secondary | ICD-10-CM | POA: Diagnosis not present

## 2015-06-14 ENCOUNTER — Ambulatory Visit (INDEPENDENT_AMBULATORY_CARE_PROVIDER_SITE_OTHER): Payer: Medicare Other | Admitting: Pharmacist

## 2015-06-14 DIAGNOSIS — I4891 Unspecified atrial fibrillation: Secondary | ICD-10-CM | POA: Diagnosis not present

## 2015-06-14 DIAGNOSIS — I481 Persistent atrial fibrillation: Secondary | ICD-10-CM | POA: Diagnosis not present

## 2015-06-14 DIAGNOSIS — Z7901 Long term (current) use of anticoagulants: Secondary | ICD-10-CM | POA: Diagnosis not present

## 2015-06-14 DIAGNOSIS — I4819 Other persistent atrial fibrillation: Secondary | ICD-10-CM

## 2015-06-14 LAB — POCT INR: INR: 2.9

## 2015-07-26 ENCOUNTER — Ambulatory Visit (INDEPENDENT_AMBULATORY_CARE_PROVIDER_SITE_OTHER): Payer: Medicare Other | Admitting: Pharmacist Clinician (PhC)/ Clinical Pharmacy Specialist

## 2015-07-26 DIAGNOSIS — I481 Persistent atrial fibrillation: Secondary | ICD-10-CM | POA: Diagnosis not present

## 2015-07-26 DIAGNOSIS — Z7901 Long term (current) use of anticoagulants: Secondary | ICD-10-CM

## 2015-07-26 DIAGNOSIS — I4819 Other persistent atrial fibrillation: Secondary | ICD-10-CM

## 2015-07-26 DIAGNOSIS — I4891 Unspecified atrial fibrillation: Secondary | ICD-10-CM | POA: Diagnosis not present

## 2015-07-26 LAB — POCT INR: INR: 3

## 2015-09-29 ENCOUNTER — Ambulatory Visit (INDEPENDENT_AMBULATORY_CARE_PROVIDER_SITE_OTHER): Payer: Medicare Other | Admitting: Pharmacist

## 2015-09-29 DIAGNOSIS — I4891 Unspecified atrial fibrillation: Secondary | ICD-10-CM

## 2015-09-29 DIAGNOSIS — I4819 Other persistent atrial fibrillation: Secondary | ICD-10-CM

## 2015-09-29 DIAGNOSIS — Z7901 Long term (current) use of anticoagulants: Secondary | ICD-10-CM | POA: Diagnosis not present

## 2015-09-29 DIAGNOSIS — I481 Persistent atrial fibrillation: Secondary | ICD-10-CM | POA: Diagnosis not present

## 2015-09-29 LAB — POCT INR: INR: 2.9

## 2015-10-03 ENCOUNTER — Other Ambulatory Visit: Payer: Self-pay | Admitting: Cardiology

## 2015-10-03 NOTE — Telephone Encounter (Signed)
Rx request sent to pharmacy.  

## 2015-10-12 DIAGNOSIS — R972 Elevated prostate specific antigen [PSA]: Secondary | ICD-10-CM | POA: Diagnosis not present

## 2015-10-14 ENCOUNTER — Ambulatory Visit (INDEPENDENT_AMBULATORY_CARE_PROVIDER_SITE_OTHER): Payer: Medicare Other | Admitting: Cardiology

## 2015-10-14 ENCOUNTER — Encounter: Payer: Self-pay | Admitting: Cardiology

## 2015-10-14 VITALS — BP 132/80 | HR 76 | Ht 71.75 in | Wt 308.0 lb

## 2015-10-14 DIAGNOSIS — R600 Localized edema: Secondary | ICD-10-CM | POA: Diagnosis not present

## 2015-10-14 DIAGNOSIS — E785 Hyperlipidemia, unspecified: Secondary | ICD-10-CM

## 2015-10-14 DIAGNOSIS — E784 Other hyperlipidemia: Secondary | ICD-10-CM

## 2015-10-14 DIAGNOSIS — I4819 Other persistent atrial fibrillation: Secondary | ICD-10-CM

## 2015-10-14 DIAGNOSIS — I872 Venous insufficiency (chronic) (peripheral): Secondary | ICD-10-CM

## 2015-10-14 DIAGNOSIS — I481 Persistent atrial fibrillation: Secondary | ICD-10-CM

## 2015-10-14 DIAGNOSIS — Z7901 Long term (current) use of anticoagulants: Secondary | ICD-10-CM

## 2015-10-14 MED ORDER — FUROSEMIDE 20 MG PO TABS: ORAL_TABLET | ORAL | 3 refills | Status: DC

## 2015-10-14 NOTE — Progress Notes (Signed)
PCP: Pcp Not In System  Clinic Note: Chief Complaint  Patient presents with  . Shortness of Breath    pt states some SOB when walking   . Edema    below the left leg and a little on the right   . Follow-up    no chest pain and no light headedness or dizziness     HPI: Brett [name] is a 70 y.o. male with a PMH below who presents today for annual f/u of PAF - with Rx course complicated by TTP (thought to be related to anticoagulation med -- Pradaxa +/- Multaq). -- now on rate control & warfarin.  Brett [name] was last seen on Sept 2016 --  For the most part, he is doing well.  Just on bad days notes worsening exertional dyspnea.  Also has off & on LE Edema. No resting dyspnea.  Can walk using walker or grocery cart, can do well.  If not, has trouble with back & hip pain & gets worn out.  Not really walking as much as he used to.  Mild Venous stasis sores, but small & infreque -- Take furosemide 20 mg daily  , an additional 20 mg  Furosemide daily if weight is 3 lbs heavier  And take extra tablet the day after if you have worsening swelling ,shortness of breathe  Recent Hospitalizations: none  Studies Reviewed: none  Interval History:  Brett [name] presents today doing well without any major complaints. He says he still gets short of breath if he does more than usual activity, but denies any resting dyspnea or dyspnea on routine activity. He still has right lower extremity edema and usually wears a support hose. Lasix also helps edema pretty well. He occasionally will take an extra dose. He states that he was walking with a shopping cart or something to give him support, he is able to walk quite a long ways without problems, but otherwise his hips and back really bother him more. This probably contributes some to his exertional dyspnea.  No chest pain/ pressure with rest or exertion.  No PND, orthopnea with up & down edema.  No palpitations, lightheadedness, dizziness, weakness or  syncope/near syncope. No TIA/amaurosis fugax symptoms. No melena, hematochezia, hematuria, or epstaxis. No claudication.  ROS: A comprehensive was performed. Review of Systems  Constitutional: Negative for malaise/fatigue and weight loss.  HENT: Negative for nosebleeds.   Respiratory: Positive for shortness of breath.   Cardiovascular: Positive for leg swelling (Per history of present illness).  Musculoskeletal: Positive for joint pain (natural  aches and pains).  Skin: Negative for rash (sstill has mild punctate rash on arms and feet. But nothing like TTP).  Neurological: Positive for dizziness (Occasionally, positional).  Endo/Heme/Allergies: Does not bruise/bleed easily.  All other systems reviewed and are negative.   Past Medical History:  Diagnosis Date  . DVT of lower extremity, bilateral ([facility]) 06/17/2012   Right CFV and popliteal vein, left C. it the only;  also interstitial fluid noted in both calfs  . Dyslipidemia   . History of echocardiogram 02/07/2011   EF 50-60%; RV mildly dilated, LA moderately dilated; mild MR; mild-mod TR; RV systolic pressure elevated; mildly sclerotic AV  . History of nuclear stress test 02/07/2011   lexiscan; mild ischemia in mid inferolateral & apical lateral regions; low risk scan  . History of: Bacteremia due to coagulase-negative Staphylococcus 06/13/2012   During hospitalization May 30 through 06/19/2012   . History: TTP (thrombotic thrombocytopenic purpura) 06/11/2012  .  Hypertension   . Obesity (BMI 30-39.9)   . OSA on CPAP   . Persistent atrial fibrillation ([facility]) 02/2011   Negative Myoview; Normal EF by Echo  . Severe sepsis with acute organ dysfunction ([facility]) 5/30-6/12/2012   Prolonged hospitalization for presumed sepsis and septic shock compounded by coagulopathy thought to be TTP versus Pradaxa mediated. Temporarily on hemodialysis and plasmapheresis.   Past Surgical History:  Procedure Laterality Date  . CARDIOVERSION  06/08/2011    Procedure: CARDIOVERSION;  Surgeon: Brett Man, MD;  Location: [facility];  Service: Cardiovascular;  Laterality: N/A;  . TOTAL KNEE ARTHROPLASTY     Left knee 2010, Right knee 2011    Prior to Admission medications   Medication Sig Start Date Authorizing Provider  acetaminophen (TYLENOL) 500 MG tablet Take 1,000 mg by mouth daily.   Historical Provider, MD  diltiazem (CARDIZEM CD) 240 MG 24 hr capsule Take 1 capsule (240 mg total) by mouth daily. 06/19/12 Brett Sells, MD  diphenhydramine-acetaminophen (TYLENOL PM) 25-500 MG TABS Take 2 tablets by mouth at bedtime.  Historical Provider, MD  fish oil-omega-3 fatty acids 1000 MG capsule Take 1 g by mouth daily.  Historical Provider, MD  furosemide (LASIX) 20 MG tablet TAKE 1 TABLET TWICE A DAY AS NEEDED 10/03/15 Brett Man, MD  hydrALAZINE (APRESOLINE) 50 MG tablet Take 50 mg by mouth 2 (two) times daily.  Historical Provider, MD  loratadine (CLARITIN) 10 MG tablet Take 10 mg by mouth daily.  Historical Provider, MD  metoprolol (LOPRESSOR) 100 MG tablet Take 100 mg by mouth 3 (three) times daily. 06/19/12 Brett Sells, MD  Multiple Vitamin (MULITIVITAMIN WITH MINERALS) TABS Take 1 tablet by mouth daily.  Historical Provider, MD  NON FORMULARY at bedtime. CPAP  Historical Provider, MD  pravastatin (PRAVACHOL) 80 MG tablet Take 80 mg by mouth daily.  Historical Provider, MD  silver sulfADIAZINE (SILVADENE) 1 % cream APPLY TO AFFECTED AREA TWICE A DAY (BOTH LEGS 1TO 2 TIMES THEN WRAP ATER APPLICATION) [id number] Brett Man, MD  traMADol (ULTRAM) 50 MG tablet Take 50 mg by mouth every 6 (six) hours as needed.  Historical Provider, MD  warfarin (COUMADIN) 4 MG tablet Take 8 mg by mouth daily.  Historical Provider, MD    No Known Allergies  Social History   Social History  . Marital status: Divorced    Spouse name: N/A  . Number of children: 1  . Years of education: N/A   Social History Main Topics  . Smoking status:  Former Smoker    Types: Cigars  . Smokeless tobacco: Never Used  . Alcohol use No     Comment: couple beers a year  . Drug use: No  . Sexual activity: Not Asked   Other Topics Concern  . None   Social History Narrative   He is a divorced father of one. Prior to this hospitalization he was walking 5-10 minutes a day 4 times a week. He now is gradually building up his rehabilitation doing mild exercise daily.      He quit smoking in 1968 and rarely has alcohol.   Family History  Problem Relation Age of Onset  . Heart failure Mother 31  . Cancer Father     stomach    Wt Readings from Last 3 Encounters:  10/14/15 (!) 139.7 kg (308 lb)  10/04/14 (!) 138.5 kg (305 lb 6.4 oz)  08/24/13 132.7 kg (292 lb 8 oz)    PHYSICAL EXAM BP 132/80  Pulse 76   Ht 5' 11.75" (1.822 m)   Wt (!) 139.7 kg (308 lb)   BMI 42.06 kg/m  General appearance: alert, cooperative, appears stated age, no distress, morbidly obese and Very pleasant mood and affect. Other than obese, relatively acute appearing. Neck: no adenopathy, no carotid bruit, no JVD and supple, symmetrical, trachea midline Lungs: clear to auscultation bilaterally, normal percussion bilaterally and Nonlabored, good air movement Heart: Rate controlled irregularly irregular rhythm, S1, S2 normal, no S3 or S4, no rub and No murmur Abdomen: soft, non-tender; bowel sounds normal; no masses, no organomegaly and Obese Extremities: 2+ edema. He has compression stockings but now without dressings in place for his chronic ulcers that appear to be well healing.. No warmth to the touch nor edema. Chronic stasis changes noted Pulses: 2+ and symmetric on bilateral wrists. Bilateral lower extremities are very difficult to palpate due to significant edema and body habitus and compression stockings Skin: Venous stasis changes both legs. Neurologic: Grossly normal    Adult ECG Report  Rate: 54 ;  Rhythm: atrial fibrillation and Nonspecific ST and T  wave changes.;   Narrative Interpretation: stable EKG   Other studies Reviewed: Additional studies/ records that were reviewed today include:  Recent Labs: none available - PCP [facility]   ASSESSMENT / PLAN: Problem List Items Addressed This Visit    Severe obesity (BMI >= 40) (H[facility]) (Chronic)    The patient understands the need to lose weight with diet and exercise. We have discussed specific strategies for this.      Persistent atrial fibrillation ([facility]): CHA2DS2Vasc = 2; On Warfarin - Primary (Chronic)    Pretty much rate controlled atrial fibrillation this permanent now. Taking 3 times a day metoprolol  in addition to diltiazem for adequate rate control.  History of TTP with either Multaq or the DOAC, therefore he is now on warfarin.      Relevant Medications   furosemide (LASIX) 20 MG tablet   Other Relevant Orders   EKG 12-Lead   Long term current use of anticoagulant therapy (Chronic)    On Warfarin = followed by our Anticoagulation Clinic.      Dyslipidemia (high LDL; low HDL) (Chronic)    On statin - tolerating well. Monitored by PCP.      Relevant Orders   EKG 12-Lead   Chronic venous stasis dermatitis of both lower extremities (Chronic)    H/o Ablation - somewhat improved.  Wears compression stockings. On standing Lasix with PRN for worsening edema, wgt gain, increased Orhtopnea / PND or DOE      Bilateral leg edema (Chronic)    Related to venous stasis. On standing dose of Lasix with when necessary. Also uses compression stockings.      Relevant Orders   EKG 12-Lead    Other Visit Diagnoses   None.     Current medicines are reviewed at length with the patient today. (+/- concerns) none   PATIENT INSTRUCTIONS: OK to take additional Lasix PRN edema The following changes have been made: Continue Lasix daily with additional PM dose PRN DOE, edema or wgt gain > 3 lb  MAY TAKE AN EXTRA 20 MG OF FUROSEMIDE (LASIX) ,IF NEEDED WITH YOUR TWICE A DAY  FUROSEMIDE OR  YOU CAN DO 40 MG TWICE A DAY /ALTERNATE WITH 20 MG TWICE A DAY  CONTINUE TO USE COMPRESSION STOCKING   No other changes Your physician wants you to follow-up in 12 months with Dr Ellyn Hack.   Studies Ordered:  Orders Placed This Encounter  Procedures  . EKG 12-Lead      Glenetta Hew, M.D., M.S. Interventional Cardiologist   Pager # [phone]

## 2015-10-14 NOTE — Patient Instructions (Signed)
MAY TAKE AN EXTRA 20 MG OF FUROSEMIDE (LASIX) ,IF NEEDED WITH YOUR TWICE A DAY FUROSEMIDE OR  YOU CAN DO 40 MG TWICE A DAY /ALTERNATE WITH 20 MG TWICE A DAY  CONTINUE TO USE COMPRESSION STOCKING   Your physician wants you to follow-up in: [place] DR [facility]. You will receive a reminder letter in the mail two months in advance. If you don't receive a letter, please call our office to schedule the follow-up appointment.  If you need a refill on your cardiac medications before your next appointment, please call your pharmacy.

## 2015-10-16 ENCOUNTER — Encounter: Payer: Self-pay | Admitting: Cardiology

## 2015-10-16 NOTE — Assessment & Plan Note (Signed)
Pretty much rate controlled atrial fibrillation this permanent now. Taking 3 times a day metoprolol  in addition to diltiazem for adequate rate control.  History of TTP with either Multaq or the DOAC, therefore he is now on warfarin.

## 2015-10-16 NOTE — Assessment & Plan Note (Signed)
On Warfarin = followed by our Anticoagulation Clinic.

## 2015-10-16 NOTE — Assessment & Plan Note (Signed)
On statin - tolerating well. Monitored by PCP.

## 2015-10-16 NOTE — Assessment & Plan Note (Signed)
The patient understands the need to lose weight with diet and exercise. We have discussed specific strategies for this.  

## 2015-10-16 NOTE — Assessment & Plan Note (Signed)
Related to venous stasis. On standing dose of Lasix with when necessary. Also uses compression stockings.

## 2015-10-16 NOTE — Assessment & Plan Note (Signed)
H/o Ablation - somewhat improved.  Wears compression stockings. On standing Lasix with PRN for worsening edema, wgt gain, increased Orhtopnea / PND or DOE

## 2015-10-21 DIAGNOSIS — R972 Elevated prostate specific antigen [PSA]: Secondary | ICD-10-CM | POA: Diagnosis not present

## 2015-10-21 DIAGNOSIS — N400 Enlarged prostate without lower urinary tract symptoms: Secondary | ICD-10-CM | POA: Diagnosis not present

## 2015-11-08 DIAGNOSIS — H04123 Dry eye syndrome of bilateral lacrimal glands: Secondary | ICD-10-CM | POA: Diagnosis not present

## 2015-11-08 DIAGNOSIS — H25813 Combined forms of age-related cataract, bilateral: Secondary | ICD-10-CM | POA: Diagnosis not present

## 2015-11-08 DIAGNOSIS — H10413 Chronic giant papillary conjunctivitis, bilateral: Secondary | ICD-10-CM | POA: Diagnosis not present

## 2015-11-09 ENCOUNTER — Ambulatory Visit (INDEPENDENT_AMBULATORY_CARE_PROVIDER_SITE_OTHER): Payer: Medicare Other | Admitting: Pharmacist Clinician (PhC)/ Clinical Pharmacy Specialist

## 2015-11-09 DIAGNOSIS — Z7901 Long term (current) use of anticoagulants: Secondary | ICD-10-CM | POA: Diagnosis not present

## 2015-11-09 DIAGNOSIS — I481 Persistent atrial fibrillation: Secondary | ICD-10-CM | POA: Diagnosis not present

## 2015-11-09 DIAGNOSIS — I4819 Other persistent atrial fibrillation: Secondary | ICD-10-CM

## 2015-11-09 DIAGNOSIS — I4891 Unspecified atrial fibrillation: Secondary | ICD-10-CM

## 2015-11-09 LAB — POCT INR: INR: 2.9

## 2015-11-10 DIAGNOSIS — E785 Hyperlipidemia, unspecified: Secondary | ICD-10-CM | POA: Diagnosis not present

## 2015-11-10 DIAGNOSIS — I4891 Unspecified atrial fibrillation: Secondary | ICD-10-CM | POA: Diagnosis not present

## 2015-11-10 DIAGNOSIS — Z0000 Encounter for general adult medical examination without abnormal findings: Secondary | ICD-10-CM | POA: Diagnosis not present

## 2015-11-10 DIAGNOSIS — I10 Essential (primary) hypertension: Secondary | ICD-10-CM | POA: Diagnosis not present

## 2015-11-10 DIAGNOSIS — Z23 Encounter for immunization: Secondary | ICD-10-CM | POA: Diagnosis not present

## 2015-11-10 DIAGNOSIS — R7309 Other abnormal glucose: Secondary | ICD-10-CM | POA: Diagnosis not present

## 2015-11-10 DIAGNOSIS — E784 Other hyperlipidemia: Secondary | ICD-10-CM | POA: Diagnosis not present

## 2015-11-11 DIAGNOSIS — I10 Essential (primary) hypertension: Secondary | ICD-10-CM | POA: Diagnosis not present

## 2015-11-11 DIAGNOSIS — E784 Other hyperlipidemia: Secondary | ICD-10-CM | POA: Diagnosis not present

## 2015-11-11 DIAGNOSIS — R7309 Other abnormal glucose: Secondary | ICD-10-CM | POA: Diagnosis not present

## 2015-11-11 DIAGNOSIS — I4891 Unspecified atrial fibrillation: Secondary | ICD-10-CM | POA: Diagnosis not present

## 2015-11-11 DIAGNOSIS — Z0000 Encounter for general adult medical examination without abnormal findings: Secondary | ICD-10-CM | POA: Diagnosis not present

## 2015-12-14 DIAGNOSIS — I10 Essential (primary) hypertension: Secondary | ICD-10-CM | POA: Diagnosis not present

## 2015-12-14 DIAGNOSIS — I482 Chronic atrial fibrillation: Secondary | ICD-10-CM | POA: Diagnosis not present

## 2015-12-14 DIAGNOSIS — Z1211 Encounter for screening for malignant neoplasm of colon: Secondary | ICD-10-CM | POA: Diagnosis not present

## 2015-12-16 ENCOUNTER — Telehealth: Payer: Self-pay | Admitting: *Deleted

## 2015-12-16 NOTE — Telephone Encounter (Signed)
Request for surgical clearance:  1. What type of surgery is being performed?  COLONOSCOPY  2. When is this surgery scheduled? 02/07/2016  3. Are there any medications that need to be held prior to surgery and how long? COUMADIN FOR FIVE DAYS  4. Name of physician performing surgery? DR PATRICK HUNG  5. What is your office phone and fax number?  PHONE [phone],--- [phone]

## 2015-12-19 DIAGNOSIS — H25811 Combined forms of age-related cataract, right eye: Secondary | ICD-10-CM | POA: Diagnosis not present

## 2015-12-19 DIAGNOSIS — H2511 Age-related nuclear cataract, right eye: Secondary | ICD-10-CM | POA: Diagnosis not present

## 2015-12-19 DIAGNOSIS — H25041 Posterior subcapsular polar age-related cataract, right eye: Secondary | ICD-10-CM | POA: Diagnosis not present

## 2015-12-19 NOTE — Telephone Encounter (Signed)
OK to stop coumadin 5 d pre-op. Simply restart post-op. Would not need brigde.  Glenetta Hew, MD

## 2015-12-20 NOTE — Telephone Encounter (Signed)
Routed information to Dr Ulyses Amor office

## 2015-12-21 ENCOUNTER — Ambulatory Visit (INDEPENDENT_AMBULATORY_CARE_PROVIDER_SITE_OTHER): Payer: Medicare Other | Admitting: Pharmacist

## 2015-12-21 DIAGNOSIS — Z7901 Long term (current) use of anticoagulants: Secondary | ICD-10-CM | POA: Diagnosis not present

## 2015-12-21 DIAGNOSIS — I481 Persistent atrial fibrillation: Secondary | ICD-10-CM

## 2015-12-21 DIAGNOSIS — I4891 Unspecified atrial fibrillation: Secondary | ICD-10-CM | POA: Diagnosis not present

## 2015-12-21 DIAGNOSIS — I4819 Other persistent atrial fibrillation: Secondary | ICD-10-CM

## 2015-12-21 LAB — POCT INR: INR: 3.3

## 2016-01-26 ENCOUNTER — Other Ambulatory Visit: Payer: Self-pay | Admitting: Cardiology

## 2016-02-02 ENCOUNTER — Ambulatory Visit (INDEPENDENT_AMBULATORY_CARE_PROVIDER_SITE_OTHER): Payer: Medicare Other | Admitting: Pharmacist Clinician (PhC)/ Clinical Pharmacy Specialist

## 2016-02-02 DIAGNOSIS — I4891 Unspecified atrial fibrillation: Secondary | ICD-10-CM | POA: Diagnosis not present

## 2016-02-02 DIAGNOSIS — I481 Persistent atrial fibrillation: Secondary | ICD-10-CM

## 2016-02-02 DIAGNOSIS — I4819 Other persistent atrial fibrillation: Secondary | ICD-10-CM

## 2016-02-02 DIAGNOSIS — Z7901 Long term (current) use of anticoagulants: Secondary | ICD-10-CM

## 2016-02-02 LAB — POCT INR: INR: 2

## 2016-02-07 DIAGNOSIS — K5730 Diverticulosis of large intestine without perforation or abscess without bleeding: Secondary | ICD-10-CM | POA: Diagnosis not present

## 2016-02-07 DIAGNOSIS — D123 Benign neoplasm of transverse colon: Secondary | ICD-10-CM | POA: Diagnosis not present

## 2016-02-07 DIAGNOSIS — K635 Polyp of colon: Secondary | ICD-10-CM | POA: Diagnosis not present

## 2016-02-07 DIAGNOSIS — H2512 Age-related nuclear cataract, left eye: Secondary | ICD-10-CM | POA: Diagnosis not present

## 2016-02-07 DIAGNOSIS — Z1211 Encounter for screening for malignant neoplasm of colon: Secondary | ICD-10-CM | POA: Diagnosis not present

## 2016-02-13 DIAGNOSIS — H25042 Posterior subcapsular polar age-related cataract, left eye: Secondary | ICD-10-CM | POA: Diagnosis not present

## 2016-02-13 DIAGNOSIS — H2512 Age-related nuclear cataract, left eye: Secondary | ICD-10-CM | POA: Diagnosis not present

## 2016-02-13 DIAGNOSIS — H25812 Combined forms of age-related cataract, left eye: Secondary | ICD-10-CM | POA: Diagnosis not present

## 2016-02-22 ENCOUNTER — Ambulatory Visit (INDEPENDENT_AMBULATORY_CARE_PROVIDER_SITE_OTHER): Payer: Medicare Other | Admitting: Pharmacist

## 2016-02-22 DIAGNOSIS — Z7901 Long term (current) use of anticoagulants: Secondary | ICD-10-CM | POA: Diagnosis not present

## 2016-02-22 DIAGNOSIS — I481 Persistent atrial fibrillation: Secondary | ICD-10-CM | POA: Diagnosis not present

## 2016-02-22 DIAGNOSIS — I4891 Unspecified atrial fibrillation: Secondary | ICD-10-CM | POA: Diagnosis not present

## 2016-02-22 DIAGNOSIS — I4819 Other persistent atrial fibrillation: Secondary | ICD-10-CM

## 2016-02-22 LAB — POCT INR: INR: 5.7

## 2016-03-14 ENCOUNTER — Ambulatory Visit (INDEPENDENT_AMBULATORY_CARE_PROVIDER_SITE_OTHER): Payer: Medicare Other | Admitting: Pharmacist

## 2016-03-14 DIAGNOSIS — I4891 Unspecified atrial fibrillation: Secondary | ICD-10-CM

## 2016-03-14 DIAGNOSIS — I481 Persistent atrial fibrillation: Secondary | ICD-10-CM | POA: Diagnosis not present

## 2016-03-14 DIAGNOSIS — I4819 Other persistent atrial fibrillation: Secondary | ICD-10-CM

## 2016-03-14 DIAGNOSIS — Z7901 Long term (current) use of anticoagulants: Secondary | ICD-10-CM | POA: Diagnosis not present

## 2016-03-14 LAB — POCT INR: INR: 2.7

## 2016-03-19 DIAGNOSIS — I10 Essential (primary) hypertension: Secondary | ICD-10-CM | POA: Diagnosis not present

## 2016-03-19 DIAGNOSIS — E039 Hypothyroidism, unspecified: Secondary | ICD-10-CM | POA: Diagnosis not present

## 2016-03-19 DIAGNOSIS — M25562 Pain in left knee: Secondary | ICD-10-CM | POA: Diagnosis not present

## 2016-03-19 DIAGNOSIS — E784 Other hyperlipidemia: Secondary | ICD-10-CM | POA: Diagnosis not present

## 2016-04-12 ENCOUNTER — Ambulatory Visit (INDEPENDENT_AMBULATORY_CARE_PROVIDER_SITE_OTHER): Payer: Medicare Other | Admitting: Pharmacist Clinician (PhC)/ Clinical Pharmacy Specialist

## 2016-04-12 DIAGNOSIS — I481 Persistent atrial fibrillation: Secondary | ICD-10-CM | POA: Diagnosis not present

## 2016-04-12 DIAGNOSIS — I4891 Unspecified atrial fibrillation: Secondary | ICD-10-CM

## 2016-04-12 DIAGNOSIS — Z7901 Long term (current) use of anticoagulants: Secondary | ICD-10-CM | POA: Diagnosis not present

## 2016-04-12 DIAGNOSIS — I4819 Other persistent atrial fibrillation: Secondary | ICD-10-CM

## 2016-04-12 LAB — POCT INR: INR: 2.6

## 2016-05-09 DIAGNOSIS — G4733 Obstructive sleep apnea (adult) (pediatric): Secondary | ICD-10-CM | POA: Diagnosis not present

## 2016-05-09 DIAGNOSIS — R7309 Other abnormal glucose: Secondary | ICD-10-CM | POA: Diagnosis not present

## 2016-05-09 DIAGNOSIS — E039 Hypothyroidism, unspecified: Secondary | ICD-10-CM | POA: Diagnosis not present

## 2016-05-09 DIAGNOSIS — I482 Chronic atrial fibrillation: Secondary | ICD-10-CM | POA: Diagnosis not present

## 2016-05-09 DIAGNOSIS — E784 Other hyperlipidemia: Secondary | ICD-10-CM | POA: Diagnosis not present

## 2016-05-24 ENCOUNTER — Ambulatory Visit (INDEPENDENT_AMBULATORY_CARE_PROVIDER_SITE_OTHER): Payer: Medicare Other | Admitting: Pharmacist

## 2016-05-24 DIAGNOSIS — I4891 Unspecified atrial fibrillation: Secondary | ICD-10-CM | POA: Diagnosis not present

## 2016-05-24 DIAGNOSIS — I481 Persistent atrial fibrillation: Secondary | ICD-10-CM | POA: Diagnosis not present

## 2016-05-24 DIAGNOSIS — I4819 Other persistent atrial fibrillation: Secondary | ICD-10-CM

## 2016-05-24 DIAGNOSIS — Z7901 Long term (current) use of anticoagulants: Secondary | ICD-10-CM

## 2016-05-24 LAB — POCT INR: INR: 3.1

## 2016-07-05 ENCOUNTER — Ambulatory Visit (INDEPENDENT_AMBULATORY_CARE_PROVIDER_SITE_OTHER): Payer: Medicare Other | Admitting: Pharmacist Clinician (PhC)/ Clinical Pharmacy Specialist

## 2016-07-05 DIAGNOSIS — Z7901 Long term (current) use of anticoagulants: Secondary | ICD-10-CM | POA: Diagnosis not present

## 2016-07-05 DIAGNOSIS — I4819 Other persistent atrial fibrillation: Secondary | ICD-10-CM

## 2016-07-05 DIAGNOSIS — I4891 Unspecified atrial fibrillation: Secondary | ICD-10-CM | POA: Diagnosis not present

## 2016-07-05 DIAGNOSIS — I481 Persistent atrial fibrillation: Secondary | ICD-10-CM | POA: Diagnosis not present

## 2016-07-05 LAB — POCT INR: INR: 2.5

## 2016-08-06 ENCOUNTER — Other Ambulatory Visit: Payer: Self-pay

## 2016-08-16 ENCOUNTER — Ambulatory Visit (INDEPENDENT_AMBULATORY_CARE_PROVIDER_SITE_OTHER): Payer: Medicare Other | Admitting: Pharmacist Clinician (PhC)/ Clinical Pharmacy Specialist

## 2016-08-16 DIAGNOSIS — I481 Persistent atrial fibrillation: Secondary | ICD-10-CM

## 2016-08-16 DIAGNOSIS — I4891 Unspecified atrial fibrillation: Secondary | ICD-10-CM | POA: Diagnosis not present

## 2016-08-16 DIAGNOSIS — Z7901 Long term (current) use of anticoagulants: Secondary | ICD-10-CM

## 2016-08-16 DIAGNOSIS — I4819 Other persistent atrial fibrillation: Secondary | ICD-10-CM

## 2016-08-16 LAB — POCT INR: INR: 2.6

## 2016-08-23 DIAGNOSIS — D225 Melanocytic nevi of trunk: Secondary | ICD-10-CM | POA: Diagnosis not present

## 2016-08-23 DIAGNOSIS — L570 Actinic keratosis: Secondary | ICD-10-CM | POA: Diagnosis not present

## 2016-08-23 DIAGNOSIS — I872 Venous insufficiency (chronic) (peripheral): Secondary | ICD-10-CM | POA: Diagnosis not present

## 2016-08-23 DIAGNOSIS — I8310 Varicose veins of unspecified lower extremity with inflammation: Secondary | ICD-10-CM | POA: Diagnosis not present

## 2016-09-27 ENCOUNTER — Ambulatory Visit (INDEPENDENT_AMBULATORY_CARE_PROVIDER_SITE_OTHER): Payer: Medicare Other | Admitting: Pharmacist Clinician (PhC)/ Clinical Pharmacy Specialist

## 2016-09-27 DIAGNOSIS — I4819 Other persistent atrial fibrillation: Secondary | ICD-10-CM

## 2016-09-27 DIAGNOSIS — Z7901 Long term (current) use of anticoagulants: Secondary | ICD-10-CM

## 2016-09-27 DIAGNOSIS — I4891 Unspecified atrial fibrillation: Secondary | ICD-10-CM | POA: Diagnosis not present

## 2016-09-27 LAB — POCT INR: INR: 2.4

## 2016-10-09 ENCOUNTER — Other Ambulatory Visit: Payer: Self-pay | Admitting: Cardiology

## 2016-11-07 ENCOUNTER — Ambulatory Visit (INDEPENDENT_AMBULATORY_CARE_PROVIDER_SITE_OTHER): Payer: Medicare Other | Admitting: Pharmacist

## 2016-11-07 DIAGNOSIS — Z7901 Long term (current) use of anticoagulants: Secondary | ICD-10-CM | POA: Diagnosis not present

## 2016-11-07 DIAGNOSIS — I481 Persistent atrial fibrillation: Secondary | ICD-10-CM

## 2016-11-07 DIAGNOSIS — I4819 Other persistent atrial fibrillation: Secondary | ICD-10-CM

## 2016-11-07 LAB — POCT INR: INR: 3.6

## 2016-11-07 MED ORDER — WARFARIN SODIUM 4 MG PO TABS: ORAL_TABLET | ORAL | 1 refills | Status: DC

## 2016-11-15 DIAGNOSIS — E039 Hypothyroidism, unspecified: Secondary | ICD-10-CM | POA: Diagnosis not present

## 2016-11-15 DIAGNOSIS — R7309 Other abnormal glucose: Secondary | ICD-10-CM | POA: Diagnosis not present

## 2016-11-15 DIAGNOSIS — Z79899 Other long term (current) drug therapy: Secondary | ICD-10-CM | POA: Diagnosis not present

## 2016-11-15 DIAGNOSIS — G4733 Obstructive sleep apnea (adult) (pediatric): Secondary | ICD-10-CM | POA: Diagnosis not present

## 2016-11-15 DIAGNOSIS — I10 Essential (primary) hypertension: Secondary | ICD-10-CM | POA: Diagnosis not present

## 2016-11-15 DIAGNOSIS — I482 Chronic atrial fibrillation: Secondary | ICD-10-CM | POA: Diagnosis not present

## 2016-12-13 ENCOUNTER — Ambulatory Visit (INDEPENDENT_AMBULATORY_CARE_PROVIDER_SITE_OTHER): Payer: Medicare Other | Admitting: Pharmacist Clinician (PhC)/ Clinical Pharmacy Specialist

## 2016-12-13 DIAGNOSIS — I4819 Other persistent atrial fibrillation: Secondary | ICD-10-CM

## 2016-12-13 DIAGNOSIS — Z7901 Long term (current) use of anticoagulants: Secondary | ICD-10-CM | POA: Diagnosis not present

## 2016-12-13 DIAGNOSIS — I481 Persistent atrial fibrillation: Secondary | ICD-10-CM

## 2016-12-13 LAB — POCT INR: INR: 1.8

## 2017-01-07 ENCOUNTER — Other Ambulatory Visit: Payer: Self-pay | Admitting: Cardiology

## 2017-01-11 ENCOUNTER — Ambulatory Visit (INDEPENDENT_AMBULATORY_CARE_PROVIDER_SITE_OTHER): Payer: Medicare Other | Admitting: Pharmacist

## 2017-01-11 DIAGNOSIS — Z7901 Long term (current) use of anticoagulants: Secondary | ICD-10-CM | POA: Diagnosis not present

## 2017-01-11 DIAGNOSIS — I4819 Other persistent atrial fibrillation: Secondary | ICD-10-CM

## 2017-01-11 LAB — POCT INR: INR: 1.9

## 2017-02-07 ENCOUNTER — Ambulatory Visit (INDEPENDENT_AMBULATORY_CARE_PROVIDER_SITE_OTHER): Payer: Medicare Other | Admitting: Pharmacist Clinician (PhC)/ Clinical Pharmacy Specialist

## 2017-02-07 DIAGNOSIS — I4819 Other persistent atrial fibrillation: Secondary | ICD-10-CM

## 2017-02-07 DIAGNOSIS — I481 Persistent atrial fibrillation: Secondary | ICD-10-CM | POA: Diagnosis not present

## 2017-02-07 DIAGNOSIS — Z7901 Long term (current) use of anticoagulants: Secondary | ICD-10-CM | POA: Diagnosis not present

## 2017-02-07 LAB — POCT INR: INR: 2.5

## 2017-03-07 ENCOUNTER — Ambulatory Visit (INDEPENDENT_AMBULATORY_CARE_PROVIDER_SITE_OTHER): Payer: Medicare Other | Admitting: Pharmacist

## 2017-03-07 DIAGNOSIS — Z7901 Long term (current) use of anticoagulants: Secondary | ICD-10-CM | POA: Diagnosis not present

## 2017-03-07 DIAGNOSIS — I4819 Other persistent atrial fibrillation: Secondary | ICD-10-CM

## 2017-03-07 LAB — POCT INR: INR: 2.2

## 2017-03-07 MED ORDER — WARFARIN SODIUM 4 MG PO TABS: ORAL_TABLET | ORAL | 2 refills | Status: DC

## 2017-03-14 DIAGNOSIS — H04123 Dry eye syndrome of bilateral lacrimal glands: Secondary | ICD-10-CM | POA: Diagnosis not present

## 2017-03-14 DIAGNOSIS — H01024 Squamous blepharitis left upper eyelid: Secondary | ICD-10-CM | POA: Diagnosis not present

## 2017-03-14 DIAGNOSIS — H01022 Squamous blepharitis right lower eyelid: Secondary | ICD-10-CM | POA: Diagnosis not present

## 2017-03-14 DIAGNOSIS — H01021 Squamous blepharitis right upper eyelid: Secondary | ICD-10-CM | POA: Diagnosis not present

## 2017-03-14 DIAGNOSIS — H01025 Squamous blepharitis left lower eyelid: Secondary | ICD-10-CM | POA: Diagnosis not present

## 2017-03-14 DIAGNOSIS — Z961 Presence of intraocular lens: Secondary | ICD-10-CM | POA: Diagnosis not present

## 2017-03-14 DIAGNOSIS — H43393 Other vitreous opacities, bilateral: Secondary | ICD-10-CM | POA: Diagnosis not present

## 2017-03-14 LAB — HM DIABETES EYE EXAM

## 2017-04-07 ENCOUNTER — Other Ambulatory Visit: Payer: Self-pay | Admitting: Cardiology

## 2017-04-08 NOTE — Telephone Encounter (Signed)
REFILL 

## 2017-04-18 ENCOUNTER — Ambulatory Visit (INDEPENDENT_AMBULATORY_CARE_PROVIDER_SITE_OTHER): Payer: Medicare Other | Admitting: Pharmacist

## 2017-04-18 DIAGNOSIS — Z7901 Long term (current) use of anticoagulants: Secondary | ICD-10-CM

## 2017-04-18 DIAGNOSIS — I4819 Other persistent atrial fibrillation: Secondary | ICD-10-CM

## 2017-04-18 LAB — POCT INR: INR: 2.1

## 2017-05-16 DIAGNOSIS — I10 Essential (primary) hypertension: Secondary | ICD-10-CM | POA: Diagnosis not present

## 2017-05-16 DIAGNOSIS — R21 Rash and other nonspecific skin eruption: Secondary | ICD-10-CM | POA: Diagnosis not present

## 2017-05-16 DIAGNOSIS — Z9114 Patient's other noncompliance with medication regimen: Secondary | ICD-10-CM | POA: Diagnosis not present

## 2017-05-16 DIAGNOSIS — G4733 Obstructive sleep apnea (adult) (pediatric): Secondary | ICD-10-CM | POA: Diagnosis not present

## 2017-05-30 ENCOUNTER — Ambulatory Visit (INDEPENDENT_AMBULATORY_CARE_PROVIDER_SITE_OTHER): Payer: Medicare Other | Admitting: Pharmacist

## 2017-05-30 DIAGNOSIS — I481 Persistent atrial fibrillation: Secondary | ICD-10-CM

## 2017-05-30 DIAGNOSIS — I4819 Other persistent atrial fibrillation: Secondary | ICD-10-CM

## 2017-05-30 DIAGNOSIS — Z7901 Long term (current) use of anticoagulants: Secondary | ICD-10-CM | POA: Diagnosis not present

## 2017-05-30 LAB — POCT INR: INR: 2.6 (ref 2.0–3.0)

## 2017-07-06 ENCOUNTER — Other Ambulatory Visit: Payer: Self-pay | Admitting: Cardiology

## 2017-07-16 ENCOUNTER — Ambulatory Visit (INDEPENDENT_AMBULATORY_CARE_PROVIDER_SITE_OTHER): Payer: Medicare Other | Admitting: Pharmacist Clinician (PhC)/ Clinical Pharmacy Specialist

## 2017-07-16 DIAGNOSIS — I4819 Other persistent atrial fibrillation: Secondary | ICD-10-CM

## 2017-07-16 DIAGNOSIS — I481 Persistent atrial fibrillation: Secondary | ICD-10-CM

## 2017-07-16 DIAGNOSIS — Z7901 Long term (current) use of anticoagulants: Secondary | ICD-10-CM

## 2017-07-16 LAB — POCT INR: INR: 3 (ref 2.0–3.0)

## 2017-07-16 NOTE — Patient Instructions (Signed)
Description   Continue with 1.5 tablets daily except 1 tablet each Sunday, repeat INR in 6 weeks.

## 2017-08-29 ENCOUNTER — Ambulatory Visit (INDEPENDENT_AMBULATORY_CARE_PROVIDER_SITE_OTHER): Payer: Medicare Other | Admitting: Pharmacist

## 2017-08-29 DIAGNOSIS — Z7901 Long term (current) use of anticoagulants: Secondary | ICD-10-CM | POA: Diagnosis not present

## 2017-08-29 DIAGNOSIS — I4819 Other persistent atrial fibrillation: Secondary | ICD-10-CM

## 2017-08-29 LAB — POCT INR: INR: 2.4 (ref 2.0–3.0)

## 2017-10-05 ENCOUNTER — Other Ambulatory Visit: Payer: Self-pay | Admitting: Cardiology

## 2017-10-09 ENCOUNTER — Ambulatory Visit (INDEPENDENT_AMBULATORY_CARE_PROVIDER_SITE_OTHER): Payer: Medicare Other | Admitting: Pharmacist

## 2017-10-09 DIAGNOSIS — Z7901 Long term (current) use of anticoagulants: Secondary | ICD-10-CM | POA: Diagnosis not present

## 2017-10-09 DIAGNOSIS — I4819 Other persistent atrial fibrillation: Secondary | ICD-10-CM

## 2017-10-09 LAB — POCT INR: INR: 2.5 (ref 2.0–3.0)

## 2017-10-18 ENCOUNTER — Other Ambulatory Visit: Payer: Self-pay | Admitting: Nurse Practitioner

## 2017-11-05 ENCOUNTER — Telehealth: Payer: Self-pay | Admitting: Internal Medicine

## 2017-11-05 NOTE — Telephone Encounter (Signed)
I left a message asking the pt to come at 11:00 instead of 10:00 on 11/21/17. VDM (DD)

## 2017-11-14 ENCOUNTER — Other Ambulatory Visit: Payer: Self-pay | Admitting: Nurse Practitioner

## 2017-11-21 ENCOUNTER — Ambulatory Visit (INDEPENDENT_AMBULATORY_CARE_PROVIDER_SITE_OTHER): Payer: Medicare Other | Admitting: Nurse Practitioner

## 2017-11-21 ENCOUNTER — Encounter: Payer: Self-pay | Admitting: Nurse Practitioner

## 2017-11-21 ENCOUNTER — Ambulatory Visit: Payer: Self-pay | Admitting: Nurse Practitioner

## 2017-11-21 ENCOUNTER — Ambulatory Visit (INDEPENDENT_AMBULATORY_CARE_PROVIDER_SITE_OTHER): Payer: Medicare Other

## 2017-11-21 VITALS — BP 140/86 | HR 70 | Temp 97.9°F | Ht 71.0 in | Wt 315.0 lb

## 2017-11-21 DIAGNOSIS — E785 Hyperlipidemia, unspecified: Secondary | ICD-10-CM

## 2017-11-21 DIAGNOSIS — R200 Anesthesia of skin: Secondary | ICD-10-CM

## 2017-11-21 DIAGNOSIS — J069 Acute upper respiratory infection, unspecified: Secondary | ICD-10-CM | POA: Diagnosis not present

## 2017-11-21 DIAGNOSIS — R202 Paresthesia of skin: Secondary | ICD-10-CM

## 2017-11-21 DIAGNOSIS — R600 Localized edema: Secondary | ICD-10-CM

## 2017-11-21 DIAGNOSIS — R251 Tremor, unspecified: Secondary | ICD-10-CM | POA: Diagnosis not present

## 2017-11-21 DIAGNOSIS — G4733 Obstructive sleep apnea (adult) (pediatric): Secondary | ICD-10-CM

## 2017-11-21 DIAGNOSIS — R7303 Prediabetes: Secondary | ICD-10-CM

## 2017-11-21 DIAGNOSIS — Z0000 Encounter for general adult medical examination without abnormal findings: Secondary | ICD-10-CM | POA: Diagnosis not present

## 2017-11-21 DIAGNOSIS — I10 Essential (primary) hypertension: Secondary | ICD-10-CM

## 2017-11-21 NOTE — Patient Instructions (Signed)
Brett [name] , Thank you for taking time to come for your Medicare Wellness Visit. I appreciate your ongoing commitment to your health goals. Please review the following plan we discussed and let me know if I can assist you in the future.   Screening recommendations/referrals: Colonoscopy: 06/2010 Recommended yearly ophthalmology/optometry visit for glaucoma screening and checkup Recommended yearly dental visit for hygiene and checkup  Vaccinations: Influenza vaccine: decline Pneumococcal vaccine: decline Tdap vaccine: decline Shingles vaccine: decline    Advanced directives: Advance directive discussed with you today. I have provided a copy for you to complete at home and have notarized. Once this is complete please bring a copy in to our office so we can scan it into your chart.   Conditions/risks identified: Obesity  Next appointment: 05/22/2018 at 10:30  Preventive Care 65 Years and Older, Male Preventive care refers to lifestyle choices and visits with your health care provider that can promote health and wellness. What does preventive care include?  A yearly physical exam. This is also called an annual well check.  Dental exams once or twice a year.  Routine eye exams. Ask your health care provider how often you should have your eyes checked.  Personal lifestyle choices, including:  Daily care of your teeth and gums.  Regular physical activity.  Eating a healthy diet.  Avoiding tobacco and drug use.  Limiting alcohol use.  Practicing safe sex.  Taking low doses of aspirin every day.  Taking vitamin and mineral supplements as recommended by your health care provider. What happens during an annual well check? The services and screenings done by your health care provider during your annual well check will depend on your age, overall health, lifestyle risk factors, and family history of disease. Counseling  Your health care provider may ask you questions about  your:  Alcohol use.  Tobacco use.  Drug use.  Emotional well-being.  Home and relationship well-being.  Sexual activity.  Eating habits.  History of falls.  Memory and ability to understand (cognition).  Work and work Statistician. Screening  You may have the following tests or measurements:  Height, weight, and BMI.  Blood pressure.  Lipid and cholesterol levels. These may be checked every 5 years, or more frequently if you are over 32 years old.  Skin check.  Lung cancer screening. You may have this screening every year starting at age 50 if you have a 30-pack-year history of smoking and currently smoke or have quit within the past 15 years.  Fecal occult blood test (FOBT) of the stool. You may have this test every year starting at age 71.  Flexible sigmoidoscopy or colonoscopy. You may have a sigmoidoscopy every 5 years or a colonoscopy every 10 years starting at age 23.  Prostate cancer screening. Recommendations will vary depending on your family history and other risks.  Hepatitis C blood test.  Hepatitis B blood test.  Sexually transmitted disease (STD) testing.  Diabetes screening. This is done by checking your blood sugar (glucose) after you have not eaten for a while (fasting). You may have this done every 1-3 years.  Abdominal aortic aneurysm (AAA) screening. You may need this if you are a current or former smoker.  Osteoporosis. You may be screened starting at age 29 if you are at high risk. Talk with your health care provider about your test results, treatment options, and if necessary, the need for more tests. Vaccines  Your health care provider may recommend certain vaccines, such as:  Influenza  vaccine. This is recommended every year.  Tetanus, diphtheria, and acellular pertussis (Tdap, Td) vaccine. You may need a Td booster every 10 years.  Zoster vaccine. You may need this after age 25.  Pneumococcal 13-valent conjugate (PCV13) vaccine.  One dose is recommended after age 76.  Pneumococcal polysaccharide (PPSV23) vaccine. One dose is recommended after age 43. Talk to your health care provider about which screenings and vaccines you need and how often you need them. This information is not intended to replace advice given to you by your health care provider. Make sure you discuss any questions you have with your health care provider. Document Released: 01/21/2015 Document Revised: 09/14/2015 Document Reviewed: 10/26/2014 Elsevier Interactive Patient Education  2017 [place] Prevention in the Home Falls can cause injuries. They can happen to people of all ages. There are many things you can do to make your home safe and to help prevent falls. What can I do on the outside of my home?  Regularly fix the edges of walkways and driveways and fix any cracks.  Remove anything that might make you trip as you walk through a door, such as a raised step or threshold.  Trim any bushes or trees on the path to your home.  Use bright outdoor lighting.  Clear any walking paths of anything that might make someone trip, such as rocks or tools.  Regularly check to see if handrails are loose or broken. Make sure that both sides of any steps have handrails.  Any raised decks and porches should have guardrails on the edges.  Have any leaves, snow, or ice cleared regularly.  Use sand or salt on walking paths during winter.  Clean up any spills in your garage right away. This includes oil or grease spills. What can I do in the bathroom?  Use night lights.  Install grab bars by the toilet and in the tub and shower. Do not use towel bars as grab bars.  Use non-skid mats or decals in the tub or shower.  If you need to sit down in the shower, use a plastic, non-slip stool.  Keep the floor dry. Clean up any water that spills on the floor as soon as it happens.  Remove soap buildup in the tub or shower regularly.  Attach bath  mats securely with double-sided non-slip rug tape.  Do not have throw rugs and other things on the floor that can make you trip. What can I do in the bedroom?  Use night lights.  Make sure that you have a light by your bed that is easy to reach.  Do not use any sheets or blankets that are too big for your bed. They should not hang down onto the floor.  Have a firm chair that has side arms. You can use this for support while you get dressed.  Do not have throw rugs and other things on the floor that can make you trip. What can I do in the kitchen?  Clean up any spills right away.  Avoid walking on wet floors.  Keep items that you use a lot in easy-to-reach places.  If you need to reach something above you, use a strong step stool that has a grab bar.  Keep electrical cords out of the way.  Do not use floor polish or wax that makes floors slippery. If you must use wax, use non-skid floor wax.  Do not have throw rugs and other things on the floor that can  make you trip. What can I do with my stairs?  Do not leave any items on the stairs.  Make sure that there are handrails on both sides of the stairs and use them. Fix handrails that are broken or loose. Make sure that handrails are as long as the stairways.  Check any carpeting to make sure that it is firmly attached to the stairs. Fix any carpet that is loose or worn.  Avoid having throw rugs at the top or bottom of the stairs. If you do have throw rugs, attach them to the floor with carpet tape.  Make sure that you have a light switch at the top of the stairs and the bottom of the stairs. If you do not have them, ask someone to add them for you. What else can I do to help prevent falls?  Wear shoes that:  Do not have high heels.  Have rubber bottoms.  Are comfortable and fit you well.  Are closed at the toe. Do not wear sandals.  If you use a stepladder:  Make sure that it is fully opened. Do not climb a closed  stepladder.  Make sure that both sides of the stepladder are locked into place.  Ask someone to hold it for you, if possible.  Clearly mark and make sure that you can see:  Any grab bars or handrails.  First and last steps.  Where the edge of each step is.  Use tools that help you move around (mobility aids) if they are needed. These include:  Canes.  Walkers.  Scooters.  Crutches.  Turn on the lights when you go into a dark area. Replace any light bulbs as soon as they burn out.  Set up your furniture so you have a clear path. Avoid moving your furniture around.  If any of your floors are uneven, fix them.  If there are any pets around you, be aware of where they are.  Review your medicines with your doctor. Some medicines can make you feel dizzy. This can increase your chance of falling. Ask your doctor what other things that you can do to help prevent falls. This information is not intended to replace advice given to you by your health care provider. Make sure you discuss any questions you have with your health care provider. Document Released: 10/21/2008 Document Revised: 06/02/2015 Document Reviewed: 01/29/2014 Elsevier Interactive Patient Education  2017 [employer].

## 2017-11-21 NOTE — Progress Notes (Signed)
Subjective:   Brett [name] is a 72 y.o. male who presents for Medicare Annual/Subsequent preventive examination.  Review of Systems:  n/a Cardiac Risk Factors include: advanced age (>71men, >31 women);hypertension;male gender;sedentary lifestyle;obesity (BMI >30kg/m2)     Objective:    Vitals: BP 140/86 (BP Location: Right Arm)   Pulse 70   Temp 97.9 F (36.6 C)   Ht 5\' 11"  (1.803 m)   Wt (!) 315 lb (142.9 kg)   SpO2 96%   BMI 43.93 kg/m   Body mass index is 43.93 kg/m.  Advanced Directives 11/21/2017 06/11/2012 06/08/2011  Does Patient Have a Medical Advance Directive? No Patient does not have advance directive Patient does not have advance directive  Would patient like information on creating a medical advance directive? Yes (MAU/Ambulatory/Procedural Areas - Information given) - -  Pre-existing out of facility DNR order (yellow form or pink MOST form) - - No    Tobacco Social History   Tobacco Use  Smoking Status Former Smoker  . Types: Cigars  Smokeless Tobacco Never Used     Counseling given: Not Answered   Clinical Intake:  Pre-visit preparation completed: Yes  Pain : No/denies pain Pain Score: 0-No pain     Nutritional Status: BMI > 30  Obese Nutritional Risks: None Diabetes: No  How often do you need to have someone help you when you read instructions, pamphlets, or other written materials from your doctor or pharmacy?: 1 - Never What is the last grade level you completed in school?: 2 years of college  Interpreter Needed?: No  Information entered by :: NAllen LPN  Past Medical History:  Diagnosis Date  . DVT of lower extremity, bilateral ([place]) 06/17/2012   Right CFV and popliteal vein, left C. it the only;  also interstitial fluid noted in both calfs  . Dyslipidemia   . History of echocardiogram 02/07/2011   EF 50-60%; RV mildly dilated, LA moderately dilated; mild MR; mild-mod TR; RV systolic pressure elevated; mildly sclerotic AV  .  History of nuclear stress test 02/07/2011   lexiscan; mild ischemia in mid inferolateral & apical lateral regions; low risk scan  . History of: Bacteremia due to coagulase-negative Staphylococcus 06/13/2012   During hospitalization May 30 through 06/19/2012   . History: TTP (thrombotic thrombocytopenic purpura) 06/11/2012  . Hypertension   . Obesity (BMI 30-39.9)   . OSA on CPAP   . Persistent atrial fibrillation 02/2011   Negative Myoview; Normal EF by Echo  . Severe sepsis with acute organ dysfunction ([place]) 5/30-6/12/2012   Prolonged hospitalization for presumed sepsis and septic shock compounded by coagulopathy thought to be TTP versus Pradaxa mediated. Temporarily on hemodialysis and plasmapheresis.   Past Surgical History:  Procedure Laterality Date  . CARDIOVERSION  06/08/2011   Procedure: CARDIOVERSION;  Surgeon: Leonie Man, MD;  Location: [facility];  Service: Cardiovascular;  Laterality: N/A;  . TOTAL KNEE ARTHROPLASTY     Left knee 2010, Right knee 2011   Family History  Problem Relation Age of Onset  . Heart failure Mother 58  . Cancer Father        stomach   Social History   Socioeconomic History  . Marital status: Divorced    Spouse name: Not on file  . Number of children: 1  . Years of education: Not on file  . Highest education level: Not on file  Occupational History  . Occupation: retired  Scientific laboratory technician  . Financial resource strain: Not hard at all  .  Food insecurity:    Worry: Never true    Inability: Never true  . Transportation needs:    Medical: No    Non-medical: No  Tobacco Use  . Smoking status: Former Smoker    Types: Cigars  . Smokeless tobacco: Never Used  Substance and Sexual Activity  . Alcohol use: No    Comment: couple beers a year  . Drug use: No  . Sexual activity: Not Currently  Lifestyle  . Physical activity:    Days per week: Not on file    Minutes per session: Not on file  . Stress: Not at all  Relationships  . Social  connections:    Talks on phone: Not on file    Gets together: Not on file    Attends religious service: Not on file    Active member of club or organization: Not on file    Attends meetings of clubs or organizations: Not on file    Relationship status: Not on file  Other Topics Concern  . Not on file  Social History Narrative   He is a divorced father of one. Prior to this hospitalization he was walking 5-10 minutes a day 4 times a week. He now is gradually building up his rehabilitation doing mild exercise daily.      He quit smoking in 1968 and rarely has alcohol.    Outpatient Encounter Medications as of 11/21/2017  Medication Sig  . diltiazem (CARDIZEM CD) 240 MG 24 hr capsule Take 1 capsule (240 mg total) by mouth daily.  . diphenhydramine-acetaminophen (TYLENOL PM) 25-500 MG TABS Take 2 tablets by mouth at bedtime.  . fish oil-omega-3 fatty acids 1000 MG capsule Take 1 g by mouth daily.  . furosemide (LASIX) 20 MG tablet TAKE 1 TABLET TWICE A DAY, MAY TAKE AN EXTRA 20 MG (1 TABLET) AS NEEDED DAILY  . hydrALAZINE (APRESOLINE) 50 MG tablet TAKE 1 TABLET TWICE A DAY WITH FOOD  . loratadine (CLARITIN) 10 MG tablet Take 10 mg by mouth daily.  . metoprolol (LOPRESSOR) 100 MG tablet Take 100 mg by mouth 3 (three) times daily.  . Multiple Vitamin (MULITIVITAMIN WITH MINERALS) TABS Take 1 tablet by mouth daily.  . NON FORMULARY at bedtime. CPAP  . pravastatin (PRAVACHOL) 80 MG tablet Take 80 mg by mouth daily.  . silver sulfADIAZINE (SILVADENE) 1 % cream APPLY TO AFFECTED AREA TWICE A DAY (BOTH LEGS 1TO 2 TIMES THEN WRAP ATER APPLICATION)  . traMADol (ULTRAM) 50 MG tablet Take 50 mg by mouth every 6 (six) hours as needed.  . warfarin (COUMADIN) 4 MG tablet Take 1 to 1 and 1/2 tablets daily as directed per coumadin clinic  . acetaminophen (TYLENOL) 500 MG tablet Take 1,000 mg by mouth daily.    No facility-administered encounter medications on file as of 11/21/2017.     Activities of  Daily Living In your present state of health, do you have any difficulty performing the following activities: 11/21/2017  Hearing? Y  Comment hearing loss in both ears  Vision? Y  Comment uses reading glasses  Difficulty concentrating or making decisions? N  Walking or climbing stairs? Y  Comment uses cane  Dressing or bathing? N  Doing errands, shopping? N  Preparing Food and eating ? N  Using the Toilet? N  In the past six months, have you accidently leaked urine? N  Do you have problems with loss of bowel control? N  Managing your Medications? N  Managing your Finances? N  Housekeeping or managing your Housekeeping? N  Some recent data might be hidden    Patient Care Team: Glendale Chard, MD as PCP - General (Internal Medicine) Burt Ek, Gayland Curry, FNP as Nurse Practitioner (Psychiatry)   Assessment:   This is a routine wellness examination for Davidjames.  Exercise Activities and Dietary recommendations Current Exercise Habits: The patient does not participate in regular exercise at present, Exercise limited by: orthopedic condition(s)  Goals    . DIET - INCREASE WATER INTAKE (pt-stated)     Decrease salt, lose weight       Fall Risk Fall Risk  11/21/2017 11/21/2017 08/06/2016  Falls in the past year? 0 0 No  Comment - - Emmi Telephone Survey: data to providers prior to load  Risk for fall due to : Medication side effect - -   Is the patient's home free of loose throw rugs in walkways, pet beds, electrical cords, etc?   yes      Grab bars in the bathroom? yes      Handrails on the stairs?   yes      Adequate lighting?   yes  Timed Get Up and Go Performed: n/a  Depression Screen PHQ 2/9 Scores 11/21/2017 11/21/2017  PHQ - 2 Score 0 0    Cognitive Function     6CIT Screen 11/21/2017  What Year? 0 points  What month? 0 points  What time? 0 points  Count back from 20 0 points  Months in reverse 0 points  Repeat phrase 0 points  Total Score 0     Immunization History  Administered Date(s) Administered  . Influenza-Unspecified 12/08/2012    Qualifies for Shingles Vaccine?  yes  Screening Tests Health Maintenance  Topic Date Due  . Hepatitis C Screening  02/19/1945  . INFLUENZA VACCINE  10/09/2018 (Originally 08/08/2017)  . PNA vac Low Risk Adult (1 of 2 - PCV13) 11/22/2018 (Originally 11/29/2009)  . COLONOSCOPY  06/29/2020  . TETANUS/TDAP  09/16/2022   Cancer Screenings: Lung: Low Dose CT Chest recommended if Age 8-80 years, 30 pack-year currently smoking OR have quit w/in 15years. Patient does not qualify. Colorectal: up to date  Additional Screenings:  Hepatitis C Screening:due      Plan:    Patient wants to increase water intake, decrease salt and lose weight.  I have personally reviewed and noted the following in the patient's chart:   . Medical and social history . Use of alcohol, tobacco or illicit drugs  . Current medications and supplements . Functional ability and status . Nutritional status . Physical activity . Advanced directives . List of other physicians . Hospitalizations, surgeries, and ER visits in previous 12 months . Vitals . Screenings to include cognitive, depression, and falls . Referrals and appointments  In addition, I have reviewed and discussed with patient certain preventive protocols, quality metrics, and best practice recommendations. A written personalized care plan for preventive services as well as general preventive health recommendations were provided to patient.     Kellie Simmering, LPN  [id number]

## 2017-11-21 NOTE — Progress Notes (Signed)
Subjective:     Patient ID: Brett [name] , male    DOB: [birth year] , 72 y.o.   MRN: [id number]   Chief Complaint  Patient presents with  . Hypertension  . URI    HPI  Hypertension  This is a chronic problem. The current episode started more than 1 year ago. The problem has been gradually worsening since onset. The problem is uncontrolled. Pertinent negatives include no anxiety or chest pain. Risk factors for coronary artery disease include obesity and male gender. Past treatments include ACE inhibitors and alpha 1 blockers. There are no compliance problems.  There is no history of angina.  URI   This is a new problem. The current episode started in the past 7 days. There has been no fever. Associated symptoms include congestion and coughing (sputum light yellow). Pertinent negatives include no chest pain, ear pain, nausea, sinus pain, sore throat or vomiting. Treatments tried: nyquil as needed. The treatment provided moderate relief.     Past Medical History:  Diagnosis Date  . DVT of lower extremity, bilateral ([place]) 06/17/2012   Right CFV and popliteal vein, left C. it the only;  also interstitial fluid noted in both calfs  . Dyslipidemia   . History of echocardiogram 02/07/2011   EF 50-60%; RV mildly dilated, LA moderately dilated; mild MR; mild-mod TR; RV systolic pressure elevated; mildly sclerotic AV  . History of nuclear stress test 02/07/2011   lexiscan; mild ischemia in mid inferolateral & apical lateral regions; low risk scan  . History of: Bacteremia due to coagulase-negative Staphylococcus 06/13/2012   During hospitalization May 30 through 06/19/2012   . History: TTP (thrombotic thrombocytopenic purpura) 06/11/2012  . Hypertension   . Obesity (BMI 30-39.9)   . OSA on CPAP   . Persistent atrial fibrillation 02/2011   Negative Myoview; Normal EF by Echo  . Severe sepsis with acute organ dysfunction ([place]) 5/30-6/12/2012   Prolonged hospitalization for presumed sepsis and  septic shock compounded by coagulopathy thought to be TTP versus Pradaxa mediated. Temporarily on hemodialysis and plasmapheresis.     Family History  Problem Relation Age of Onset  . Heart failure Mother 49  . Cancer Father        stomach     Current Outpatient Medications:  .  acetaminophen (TYLENOL) 500 MG tablet, Take 1,000 mg by mouth daily. , Disp: , Rfl:  .  diltiazem (CARDIZEM CD) 240 MG 24 hr capsule, Take 1 capsule (240 mg total) by mouth daily., Disp: , Rfl:  .  diphenhydramine-acetaminophen (TYLENOL PM) 25-500 MG TABS, Take 2 tablets by mouth at bedtime., Disp: , Rfl:  .  fish oil-omega-3 fatty acids 1000 MG capsule, Take 1 g by mouth daily., Disp: , Rfl:  .  furosemide (LASIX) 20 MG tablet, TAKE 1 TABLET TWICE A DAY, MAY TAKE AN EXTRA 20 MG (1 TABLET) AS NEEDED DAILY, Disp: 270 tablet, Rfl: 0 .  hydrALAZINE (APRESOLINE) 50 MG tablet, TAKE 1 TABLET TWICE A DAY WITH FOOD, Disp: 180 tablet, Rfl: 0 .  loratadine (CLARITIN) 10 MG tablet, Take 10 mg by mouth daily., Disp: , Rfl:  .  metoprolol (LOPRESSOR) 100 MG tablet, Take 100 mg by mouth 3 (three) times daily., Disp: , Rfl:  .  Multiple Vitamin (MULITIVITAMIN WITH MINERALS) TABS, Take 1 tablet by mouth daily., Disp: , Rfl:  .  NON FORMULARY, at bedtime. CPAP, Disp: , Rfl:  .  pravastatin (PRAVACHOL) 80 MG tablet, Take 80 mg by mouth daily., Disp: ,  Rfl:  .  silver sulfADIAZINE (SILVADENE) 1 % cream, APPLY TO AFFECTED AREA TWICE A DAY (BOTH LEGS 1TO 2 TIMES THEN WRAP ATER APPLICATION), Disp: 50 g, Rfl: 0 .  traMADol (ULTRAM) 50 MG tablet, Take 50 mg by mouth every 6 (six) hours as needed., Disp: , Rfl:  .  warfarin (COUMADIN) 4 MG tablet, Take 1 to 1 and 1/2 tablets daily as directed per coumadin clinic, Disp: 135 tablet, Rfl: 2   No Known Allergies   Review of Systems  HENT: Positive for congestion. Negative for ear pain, sinus pain and sore throat.   Respiratory: Positive for cough (sputum light yellow).   Cardiovascular:  Negative for chest pain.  Gastrointestinal: Negative for nausea and vomiting.  Neurological: Positive for tremors (left hand mostly when carrying heavy items. ).     Today's Vitals   11/21/17 1123  BP: 140/86  Pulse: 70  Temp: 97.9 F (36.6 C)  TempSrc: Oral  SpO2: 96%  Weight: (!) 315 lb (142.9 kg)  Height: 5\' 11"  (1.803 m)  PainSc: 0-No pain   Body mass index is 43.93 kg/m.   Objective:  Physical Exam  Constitutional: He is oriented to person, place, and time. He appears well-developed and well-nourished.  Cardiovascular: Normal rate, regular rhythm and normal heart sounds.  No murmur heard. Pulmonary/Chest: Effort normal and breath sounds normal. No respiratory distress.  Musculoskeletal: Normal range of motion. He exhibits no edema or tenderness.  Neurological: He is alert and oriented to person, place, and time. He has normal strength. No cranial nerve deficit or sensory deficit. He exhibits normal muscle tone. Coordination normal.  Negative for cogwheeling.  Skin: Skin is warm and dry. Capillary refill takes less than 2 seconds.  Psychiatric: He has a normal mood and affect.        Assessment And Plan:   1. Essential hypertension  Chronic, controlled  Continue with current medications - CBC no Diff - CMP14 + Anion Gap  2. OSA (obstructive sleep apnea)  Chronic,  Continue with use of CPAP  3. Bilateral leg edema  Chronic  Encouraged to elevate bilateral lower extremities  4. Prediabetes  Chronic, controlled  Continue with current medications - Hemoglobin A1c - Lipid panel  5. Numbness and tingling sensation of skin  Reports tingling to bilateral hands  Will check for metabolic  - Vitamin I95  6. Viral upper respiratory tract infection  Encouraged to increase fluid intake and treat symptomatically  If not better return call to office.  7. Tremor of left hand  Mild tremor noted at rest  Likely essential tremors, will check for  metabolic causes.  Negative for cogwheeling - Vitamin B12 - TSH  8. Hyperlipidemia, unspecified hyperlipidemia type  Chronic, controlled  Continue with current medications - Lipid panel     [name], FNP

## 2017-11-22 LAB — CBC
HEMATOCRIT: 42 % (ref 37.5–51.0)
Hemoglobin: 14.9 g/dL (ref 13.0–17.7)
MCH: 28.8 pg (ref 26.6–33.0)
MCHC: 35.5 g/dL (ref 31.5–35.7)
MCV: 81 fL (ref 79–97)
PLATELETS: 273 10*3/uL (ref 150–450)
RBC: 5.17 x10E6/uL (ref 4.14–5.80)
RDW: 14.6 % (ref 12.3–15.4)
WBC: 7.9 10*3/uL (ref 3.4–10.8)

## 2017-11-22 LAB — CMP14 + ANION GAP
ALK PHOS: 82 IU/L (ref 39–117)
ALT: 19 IU/L (ref 0–44)
ANION GAP: 16 mmol/L (ref 10.0–18.0)
AST: 22 IU/L (ref 0–40)
Albumin/Globulin Ratio: 1.1 — ABNORMAL LOW (ref 1.2–2.2)
Albumin: 4 g/dL (ref 3.5–4.8)
BUN/Creatinine Ratio: 16 (ref 10–24)
BUN: 19 mg/dL (ref 8–27)
Bilirubin Total: 0.5 mg/dL (ref 0.0–1.2)
CALCIUM: 9.1 mg/dL (ref 8.6–10.2)
CO2: 25 mmol/L (ref 20–29)
CREATININE: 1.17 mg/dL (ref 0.76–1.27)
Chloride: 98 mmol/L (ref 96–106)
GFR calc Af Amer: 72 mL/min/{1.73_m2} (ref >59)
GFR, EST NON AFRICAN AMERICAN: 62 mL/min/{1.73_m2} (ref >59)
Globulin, Total: 3.7 g/dL (ref 1.5–4.5)
Glucose: 104 mg/dL — ABNORMAL HIGH (ref 65–99)
Potassium: 4.6 mmol/L (ref 3.5–5.2)
Sodium: 139 mmol/L (ref 134–144)
Total Protein: 7.7 g/dL (ref 6.0–8.5)

## 2017-11-22 LAB — LIPID PANEL
CHOLESTEROL TOTAL: 101 mg/dL (ref 100–199)
Chol/HDL Ratio: 4 ratio (ref 0.0–5.0)
HDL: 25 mg/dL — AB (ref >39)
LDL CALC: 48 mg/dL (ref 0–99)
TRIGLYCERIDES: 142 mg/dL (ref 0–149)
VLDL CHOLESTEROL CAL: 28 mg/dL (ref 5–40)

## 2017-11-22 LAB — HEMOGLOBIN A1C
ESTIMATED AVERAGE GLUCOSE: 126 mg/dL
HEMOGLOBIN A1C: 6 % — AB (ref 4.8–5.6)

## 2017-11-22 LAB — TSH: TSH: 1.83 u[IU]/mL (ref 0.450–4.500)

## 2017-11-22 LAB — VITAMIN B12: VITAMIN B 12: 614 pg/mL (ref 232–1245)

## 2017-11-26 ENCOUNTER — Ambulatory Visit (INDEPENDENT_AMBULATORY_CARE_PROVIDER_SITE_OTHER): Payer: Medicare Other | Admitting: Pharmacist Clinician (PhC)/ Clinical Pharmacy Specialist

## 2017-11-26 DIAGNOSIS — Z7901 Long term (current) use of anticoagulants: Secondary | ICD-10-CM

## 2017-11-26 DIAGNOSIS — I4819 Other persistent atrial fibrillation: Secondary | ICD-10-CM | POA: Diagnosis not present

## 2017-11-26 LAB — POCT INR: INR: 2.9 (ref 2.0–3.0)

## 2017-12-02 DIAGNOSIS — I10 Essential (primary) hypertension: Secondary | ICD-10-CM | POA: Insufficient documentation

## 2017-12-02 DIAGNOSIS — R7303 Prediabetes: Secondary | ICD-10-CM | POA: Insufficient documentation

## 2017-12-02 DIAGNOSIS — I129 Hypertensive chronic kidney disease with stage 1 through stage 4 chronic kidney disease, or unspecified chronic kidney disease: Secondary | ICD-10-CM | POA: Insufficient documentation

## 2017-12-02 DIAGNOSIS — E785 Hyperlipidemia, unspecified: Secondary | ICD-10-CM | POA: Insufficient documentation

## 2017-12-03 ENCOUNTER — Other Ambulatory Visit: Payer: Self-pay | Admitting: Nurse Practitioner

## 2018-01-04 ENCOUNTER — Other Ambulatory Visit: Payer: Self-pay | Admitting: Cardiology

## 2018-01-09 ENCOUNTER — Other Ambulatory Visit: Payer: Self-pay | Admitting: Cardiology

## 2018-01-09 ENCOUNTER — Ambulatory Visit (INDEPENDENT_AMBULATORY_CARE_PROVIDER_SITE_OTHER): Payer: Medicare Other | Admitting: Pharmacist Clinician (PhC)/ Clinical Pharmacy Specialist

## 2018-01-09 DIAGNOSIS — Z7901 Long term (current) use of anticoagulants: Secondary | ICD-10-CM | POA: Diagnosis not present

## 2018-01-09 DIAGNOSIS — I4819 Other persistent atrial fibrillation: Secondary | ICD-10-CM

## 2018-01-09 LAB — POCT INR: INR: 2.2 (ref 2.0–3.0)

## 2018-01-16 ENCOUNTER — Other Ambulatory Visit: Payer: Self-pay | Admitting: Nurse Practitioner

## 2018-01-20 ENCOUNTER — Other Ambulatory Visit: Payer: Self-pay | Admitting: Nurse Practitioner

## 2018-01-20 ENCOUNTER — Other Ambulatory Visit: Payer: Self-pay

## 2018-01-20 MED ORDER — TRAMADOL HCL 50 MG PO TABS: 50.0000 mg | ORAL_TABLET | Freq: Four times a day (QID) | ORAL | 0 refills | Status: DC | PRN

## 2018-01-27 ENCOUNTER — Other Ambulatory Visit: Payer: Self-pay | Admitting: Nurse Practitioner

## 2018-01-27 DIAGNOSIS — M25562 Pain in left knee: Principal | ICD-10-CM

## 2018-01-27 DIAGNOSIS — G8929 Other chronic pain: Secondary | ICD-10-CM

## 2018-01-27 MED ORDER — TRAMADOL HCL 50 MG PO TABS: 50.0000 mg | ORAL_TABLET | Freq: Two times a day (BID) | ORAL | 2 refills | Status: DC | PRN

## 2018-02-19 ENCOUNTER — Ambulatory Visit (INDEPENDENT_AMBULATORY_CARE_PROVIDER_SITE_OTHER): Payer: Medicare Other | Admitting: Pharmacist Clinician (PhC)/ Clinical Pharmacy Specialist

## 2018-02-19 DIAGNOSIS — Z7901 Long term (current) use of anticoagulants: Secondary | ICD-10-CM

## 2018-02-19 DIAGNOSIS — I4819 Other persistent atrial fibrillation: Secondary | ICD-10-CM | POA: Diagnosis not present

## 2018-02-19 LAB — POCT INR: INR: 4 — AB (ref 2.0–3.0)

## 2018-02-19 NOTE — Patient Instructions (Signed)
No warfarin today Wednesday Feb 12, then continue with 1.5 tablets daily except 1 tablet each Sunday, repeat INR in 3 weeks

## 2018-03-12 ENCOUNTER — Ambulatory Visit (INDEPENDENT_AMBULATORY_CARE_PROVIDER_SITE_OTHER): Payer: Medicare Other | Admitting: Pharmacist Clinician (PhC)/ Clinical Pharmacy Specialist

## 2018-03-12 DIAGNOSIS — I4819 Other persistent atrial fibrillation: Secondary | ICD-10-CM

## 2018-03-12 DIAGNOSIS — Z7901 Long term (current) use of anticoagulants: Secondary | ICD-10-CM | POA: Diagnosis not present

## 2018-03-12 LAB — POCT INR: INR: 2.5 (ref 2.0–3.0)

## 2018-03-18 DIAGNOSIS — H04123 Dry eye syndrome of bilateral lacrimal glands: Secondary | ICD-10-CM | POA: Diagnosis not present

## 2018-03-18 DIAGNOSIS — H43813 Vitreous degeneration, bilateral: Secondary | ICD-10-CM | POA: Diagnosis not present

## 2018-03-18 DIAGNOSIS — H43393 Other vitreous opacities, bilateral: Secondary | ICD-10-CM | POA: Diagnosis not present

## 2018-03-18 DIAGNOSIS — Z961 Presence of intraocular lens: Secondary | ICD-10-CM | POA: Diagnosis not present

## 2018-03-18 LAB — HM DIABETES EYE EXAM

## 2018-03-27 ENCOUNTER — Encounter: Payer: Self-pay | Admitting: Internal Medicine

## 2018-04-06 ENCOUNTER — Other Ambulatory Visit: Payer: Self-pay | Admitting: Cardiology

## 2018-04-20 ENCOUNTER — Other Ambulatory Visit: Payer: Self-pay | Admitting: Nurse Practitioner

## 2018-04-22 ENCOUNTER — Telehealth: Payer: Self-pay

## 2018-04-22 NOTE — Telephone Encounter (Signed)
LMOM FOR PRESCREEN  

## 2018-04-23 ENCOUNTER — Other Ambulatory Visit: Payer: Self-pay

## 2018-04-23 ENCOUNTER — Ambulatory Visit (INDEPENDENT_AMBULATORY_CARE_PROVIDER_SITE_OTHER): Payer: Medicare Other | Admitting: *Deleted

## 2018-04-23 DIAGNOSIS — I4819 Other persistent atrial fibrillation: Secondary | ICD-10-CM

## 2018-04-23 DIAGNOSIS — Z7901 Long term (current) use of anticoagulants: Secondary | ICD-10-CM

## 2018-04-23 LAB — POCT INR: INR: 2.4 (ref 2.0–3.0)

## 2018-05-02 ENCOUNTER — Other Ambulatory Visit: Payer: Self-pay | Admitting: Nurse Practitioner

## 2018-05-11 ENCOUNTER — Other Ambulatory Visit: Payer: Self-pay | Admitting: Nurse Practitioner

## 2018-05-22 ENCOUNTER — Encounter: Payer: Self-pay | Admitting: Nurse Practitioner

## 2018-05-22 ENCOUNTER — Ambulatory Visit (INDEPENDENT_AMBULATORY_CARE_PROVIDER_SITE_OTHER): Payer: Medicare Other | Admitting: Nurse Practitioner

## 2018-05-22 ENCOUNTER — Other Ambulatory Visit: Payer: Self-pay

## 2018-05-22 VITALS — BP 132/82 | HR 75 | Temp 98.4°F | Ht 66.8 in | Wt 319.0 lb

## 2018-05-22 DIAGNOSIS — E785 Hyperlipidemia, unspecified: Secondary | ICD-10-CM

## 2018-05-22 DIAGNOSIS — Z1159 Encounter for screening for other viral diseases: Secondary | ICD-10-CM | POA: Diagnosis not present

## 2018-05-22 DIAGNOSIS — I10 Essential (primary) hypertension: Secondary | ICD-10-CM | POA: Diagnosis not present

## 2018-05-22 DIAGNOSIS — L03115 Cellulitis of right lower limb: Secondary | ICD-10-CM | POA: Diagnosis not present

## 2018-05-22 DIAGNOSIS — R7303 Prediabetes: Secondary | ICD-10-CM

## 2018-05-22 DIAGNOSIS — S81801A Unspecified open wound, right lower leg, initial encounter: Secondary | ICD-10-CM | POA: Diagnosis not present

## 2018-05-22 HISTORY — DX: Cellulitis of right lower limb: L03.115

## 2018-05-22 MED ORDER — CEPHALEXIN 500 MG PO CAPS: 500.0000 mg | ORAL_CAPSULE | Freq: Four times a day (QID) | ORAL | 0 refills | Status: AC

## 2018-05-22 NOTE — Progress Notes (Signed)
Subjective:     Patient ID: Brett [name] , male    DOB: 21-Jan-1945 , 73 y.o.   MRN: [id number]   Chief Complaint  Patient presents with  . Hypertension    HPI  Hyperlipidemia -   Hypertension  This is a chronic problem. The current episode started more than 1 year ago. The problem has been gradually worsening since onset. The problem is uncontrolled. Pertinent negatives include no anxiety, chest pain or palpitations. Risk factors for coronary artery disease include obesity and male gender. Past treatments include ACE inhibitors and alpha 1 blockers. The current treatment provides moderate improvement. There are no compliance problems.  There is no history of angina or kidney disease.     Past Medical History:  Diagnosis Date  . DVT of lower extremity, bilateral ([place]) 06/17/2012   Right CFV and popliteal vein, left C. it the only;  also interstitial fluid noted in both calfs  . Dyslipidemia   . History of echocardiogram 02/07/2011   EF 50-60%; RV mildly dilated, LA moderately dilated; mild MR; mild-mod TR; RV systolic pressure elevated; mildly sclerotic AV  . History of nuclear stress test 02/07/2011   lexiscan; mild ischemia in mid inferolateral & apical lateral regions; low risk scan  . History of: Bacteremia due to coagulase-negative Staphylococcus 06/13/2012   During hospitalization May 30 through 06/19/2012   . History: TTP (thrombotic thrombocytopenic purpura) 06/11/2012  . Hypertension   . Obesity (BMI 30-39.9)   . OSA on CPAP   . Persistent atrial fibrillation 02/2011   Negative Myoview; Normal EF by Echo  . Severe sepsis with acute organ dysfunction ([place]) 5/30-6/12/2012   Prolonged hospitalization for presumed sepsis and septic shock compounded by coagulopathy thought to be TTP versus Pradaxa mediated. Temporarily on hemodialysis and plasmapheresis.     Family History  Problem Relation Age of Onset  . Heart failure Mother 85  . Cancer Father        stomach      Current Outpatient Medications:  .  acetaminophen (TYLENOL) 500 MG tablet, Take 1,000 mg by mouth daily. , Disp: , Rfl:  .  diltiazem (CARDIZEM CD) 240 MG 24 hr capsule, TAKE 1 CAPSULE DAILY, Disp: 90 capsule, Rfl: 0 .  diphenhydramine-acetaminophen (TYLENOL PM) 25-500 MG TABS, Take 2 tablets by mouth at bedtime., Disp: , Rfl:  .  fish oil-omega-3 fatty acids 1000 MG capsule, Take 1 g by mouth daily., Disp: , Rfl:  .  furosemide (LASIX) 20 MG tablet, TAKE 1 TABLET TWICE A DAY, MAY TAKE AN EXTRA 20 MG (1 TABLET) AS NEEDED DAILY (PLEASE SCHEDULE AN APPOINTMENT FOR REFILLS), Disp: 270 tablet, Rfl: 3 .  hydrALAZINE (APRESOLINE) 50 MG tablet, TAKE 1 TABLET TWICE A DAY WITH FOOD, Disp: 180 tablet, Rfl: 3 .  loratadine (CLARITIN) 10 MG tablet, TAKE 1 TABLET DAILY, Disp: 90 tablet, Rfl: 0 .  metoprolol tartrate (LOPRESSOR) 100 MG tablet, TAKE 1 TABLET THREE TIMES A DAY, Disp: 270 tablet, Rfl: 1 .  Multiple Vitamin (MULITIVITAMIN WITH MINERALS) TABS, Take 1 tablet by mouth daily., Disp: , Rfl:  .  NON FORMULARY, at bedtime. CPAP, Disp: , Rfl:  .  pravastatin (PRAVACHOL) 80 MG tablet, TAKE 1 TABLET DAILY, Disp: 90 tablet, Rfl: 0 .  silver sulfADIAZINE (SILVADENE) 1 % cream, APPLY TO AFFECTED AREA TWICE A DAY (BOTH LEGS 1TO 2 TIMES THEN WRAP ATER APPLICATION), Disp: 50 g, Rfl: 0 .  warfarin (COUMADIN) 4 MG tablet, TAKE ONE TO ONE AND ONE-HALF (1/2)  TABLETS DAILY AS DIRECTED PER COUMADIN CLINIC, Disp: 135 tablet, Rfl: 1   No Known Allergies   Review of Systems  Constitutional: Negative for fatigue.  HENT: Negative for congestion, ear pain, sinus pain and sore throat.   Respiratory: Negative for cough.   Cardiovascular: Negative for chest pain, palpitations and leg swelling.  Gastrointestinal: Negative for nausea and vomiting.  Neurological: Negative for tremors.     Today's Vitals   05/22/18 1045  BP: 132/82  Pulse: 75  Temp: 98.4 F (36.9 C)  TempSrc: Oral  Weight: (!) 319 lb (144.7 kg)   Height: 5' 6.8" (1.697 m)  PainSc: 0-No pain   Body mass index is 50.26 kg/m.   Objective:  Physical Exam Constitutional:      Appearance: Normal appearance. He is well-developed. He is obese.  Cardiovascular:     Rate and Rhythm: Normal rate and regular rhythm.     Heart sounds: Normal heart sounds. No murmur.  Pulmonary:     Effort: Pulmonary effort is normal. No respiratory distress.     Breath sounds: Normal breath sounds.  Musculoskeletal: Normal range of motion.        General: No tenderness.  Skin:    General: Skin is warm and dry.     Capillary Refill: Capillary refill takes less than 2 seconds.  Neurological:     General: No focal deficit present.     Mental Status: He is alert and oriented to person, place, and time.     Cranial Nerves: No cranial nerve deficit.     Sensory: No sensory deficit.     Motor: No abnormal muscle tone.     Coordination: Coordination normal.     Comments: Negative for cogwheeling.  Psychiatric:        Mood and Affect: Mood normal.        Behavior: Behavior normal.        Thought Content: Thought content normal.        Judgment: Judgment normal.         Assessment And Plan:     1. Essential hypertension . B/P is fairly controlled.  Marland Kitchen BMP ordered to check renal function.  . The importance of regular exercise as tolerated and dietary modification was stressed to the patient.  - BMP8+eGFR; Future - BMP8+eGFR  2. Prediabetes  Chronic, controlled  No current medications  Encouraged to limit intake of sugary foods and drinks - Hemoglobin A1c  3. Hyperlipidemia, unspecified hyperlipidemia type  Chronic, controlled  Continue with current medications - Lipid Profile  4. Open leg wound, right, initial encounter  Oozing with serous drainage from right lower extremity with erythema  Will treat with cephalexin  This is likely a vascular ulcer - CBC no Diff  5. Cellulitis of right lower extremity  Oozing with serous  drainage from right lower extremity with erythema  Will treat with cephalexin - cephALEXin (KEFLEX) 500 MG capsule; Take 1 capsule (500 mg total) by mouth 4 (four) times daily for 10 days.  Dispense: 40 capsule; Refill: 0  6. Encounter for hepatitis C screening test for low risk patient  Will check for Hepatitis C screening due to being born between the years 1945-1965 - Hepatitis C antibody       [name], FNP    THE PATIENT IS ENCOURAGED TO PRACTICE SOCIAL DISTANCING DUE TO THE COVID-19 PANDEMIC.

## 2018-05-23 LAB — LIPID PANEL
Chol/HDL Ratio: 4 ratio (ref 0.0–5.0)
Cholesterol, Total: 107 mg/dL (ref 100–199)
HDL: 27 mg/dL — ABNORMAL LOW (ref >39)
LDL Calculated: 56 mg/dL (ref 0–99)
Triglycerides: 119 mg/dL (ref 0–149)
VLDL Cholesterol Cal: 24 mg/dL (ref 5–40)

## 2018-05-23 LAB — BMP8+EGFR
BUN/Creatinine Ratio: 16 (ref 10–24)
BUN: 18 mg/dL (ref 8–27)
CO2: 20 mmol/L (ref 20–29)
Calcium: 9.2 mg/dL (ref 8.6–10.2)
Chloride: 98 mmol/L (ref 96–106)
Creatinine, Ser: 1.11 mg/dL (ref 0.76–1.27)
GFR calc Af Amer: 76 mL/min/{1.73_m2} (ref >59)
GFR calc non Af Amer: 66 mL/min/{1.73_m2} (ref >59)
Glucose: 126 mg/dL — ABNORMAL HIGH (ref 65–99)
Potassium: 4.3 mmol/L (ref 3.5–5.2)
Sodium: 138 mmol/L (ref 134–144)

## 2018-05-23 LAB — HEPATITIS C ANTIBODY: Hep C Virus Ab: 0.2 s/co ratio (ref 0.0–0.9)

## 2018-05-23 LAB — CBC
Hematocrit: 44.4 % (ref 37.5–51.0)
Hemoglobin: 14.6 g/dL (ref 13.0–17.7)
MCH: 27.8 pg (ref 26.6–33.0)
MCHC: 32.9 g/dL (ref 31.5–35.7)
MCV: 85 fL (ref 79–97)
Platelets: 223 10*3/uL (ref 150–450)
RBC: 5.25 x10E6/uL (ref 4.14–5.80)
RDW: 14.8 % (ref 11.6–15.4)
WBC: 7.3 10*3/uL (ref 3.4–10.8)

## 2018-05-23 LAB — HEMOGLOBIN A1C
Est. average glucose Bld gHb Est-mCnc: 151 mg/dL
Hgb A1c MFr Bld: 6.9 % — ABNORMAL HIGH (ref 4.8–5.6)

## 2018-06-04 ENCOUNTER — Telehealth: Payer: Self-pay

## 2018-06-04 ENCOUNTER — Encounter: Payer: Self-pay | Admitting: Nurse Practitioner

## 2018-06-04 NOTE — Telephone Encounter (Signed)

## 2018-06-05 ENCOUNTER — Other Ambulatory Visit: Payer: Self-pay

## 2018-06-05 ENCOUNTER — Telehealth: Payer: Self-pay

## 2018-06-05 ENCOUNTER — Ambulatory Visit (INDEPENDENT_AMBULATORY_CARE_PROVIDER_SITE_OTHER): Payer: Medicare Other | Admitting: *Deleted

## 2018-06-05 DIAGNOSIS — I4819 Other persistent atrial fibrillation: Secondary | ICD-10-CM | POA: Diagnosis not present

## 2018-06-05 DIAGNOSIS — Z7901 Long term (current) use of anticoagulants: Secondary | ICD-10-CM | POA: Diagnosis not present

## 2018-06-05 LAB — POCT INR: INR: 2.3 (ref 2.0–3.0)

## 2018-06-05 NOTE — Telephone Encounter (Signed)
lmom for appt time

## 2018-06-06 NOTE — Patient Instructions (Signed)
Description   Called spoke with pt, advised to continue on same dosage 1.5 tablets daily except 1 tablet on Sundays.  Repeat INR in 6 weeks.

## 2018-06-12 NOTE — Progress Notes (Signed)
Okay have him to take benefiber daily and he will need to follow up in 3 weeks for recheck HgbA1c.

## 2018-07-07 ENCOUNTER — Other Ambulatory Visit: Payer: Medicare Other

## 2018-07-08 ENCOUNTER — Telehealth: Payer: Self-pay

## 2018-07-08 ENCOUNTER — Other Ambulatory Visit: Payer: Self-pay | Admitting: Cardiology

## 2018-07-08 NOTE — Telephone Encounter (Signed)
lmom for prescreen  

## 2018-07-09 ENCOUNTER — Other Ambulatory Visit: Payer: Self-pay | Admitting: Internal Medicine

## 2018-07-10 ENCOUNTER — Other Ambulatory Visit: Payer: Self-pay

## 2018-07-10 ENCOUNTER — Other Ambulatory Visit: Payer: Medicare Other

## 2018-07-15 ENCOUNTER — Other Ambulatory Visit: Payer: Self-pay

## 2018-07-15 ENCOUNTER — Ambulatory Visit (INDEPENDENT_AMBULATORY_CARE_PROVIDER_SITE_OTHER): Payer: Medicare Other | Admitting: Pharmacist

## 2018-07-15 DIAGNOSIS — I4819 Other persistent atrial fibrillation: Secondary | ICD-10-CM

## 2018-07-15 DIAGNOSIS — Z7901 Long term (current) use of anticoagulants: Secondary | ICD-10-CM

## 2018-07-15 LAB — POCT INR: INR: 2.7 (ref 2.0–3.0)

## 2018-07-16 ENCOUNTER — Other Ambulatory Visit: Payer: Self-pay | Admitting: Nurse Practitioner

## 2018-07-30 ENCOUNTER — Other Ambulatory Visit: Payer: Self-pay | Admitting: Nurse Practitioner

## 2018-08-09 ENCOUNTER — Other Ambulatory Visit: Payer: Self-pay | Admitting: Nurse Practitioner

## 2018-09-02 ENCOUNTER — Other Ambulatory Visit: Payer: Self-pay

## 2018-09-02 ENCOUNTER — Ambulatory Visit (INDEPENDENT_AMBULATORY_CARE_PROVIDER_SITE_OTHER): Payer: Medicare Other | Admitting: Pharmacist

## 2018-09-02 ENCOUNTER — Telehealth: Payer: Self-pay | Admitting: Pharmacist

## 2018-09-02 DIAGNOSIS — I4819 Other persistent atrial fibrillation: Secondary | ICD-10-CM | POA: Diagnosis not present

## 2018-09-02 DIAGNOSIS — Z7901 Long term (current) use of anticoagulants: Secondary | ICD-10-CM | POA: Diagnosis not present

## 2018-09-02 LAB — POCT INR: INR: 2.3 (ref 2.0–3.0)

## 2018-09-02 NOTE — Telephone Encounter (Signed)
Med List update °

## 2018-10-01 ENCOUNTER — Telehealth: Payer: Self-pay | Admitting: Nurse Practitioner

## 2018-10-01 NOTE — Chronic Care Management (AMB) (Signed)
Chronic Care Management   Note  10/01/2018 Name: Brett [name] MRN: [id number] DOB: [birth year]  Megan Salon is a 73 y.o. year old male who is a primary care patient of [name], [place]. I reached out to Megan Salon by phone today in response to a referral sent by Mr. Hanzel Pizzo Ozer's health plan.     Mr. Heikkila was given information about Chronic Care Management services today including:  1. CCM service includes personalized support from designated clinical staff supervised by his physician, including individualized plan of care and coordination with other care providers 2. 24/7 contact phone numbers for assistance for urgent and routine care needs. 3. Service will only be billed when office clinical staff spend 20 minutes or more in a month to coordinate care. 4. Only one practitioner may furnish and bill the service in a calendar month. 5. The patient may stop CCM services at any time (effective at the end of the month) by phone call to the office staff. 6. The patient will be responsible for cost sharing (co-pay) of up to 20% of the service fee (after annual deductible is met).  Patient did not agree to enrollment in care management services and does not wish to consider at this time.  Follow up plan: The patient has been provided with contact information for the chronic care management team and has been advised to call with any health related questions or concerns.   [place]  [email or web address]   ??[phone]

## 2018-10-21 ENCOUNTER — Other Ambulatory Visit: Payer: Self-pay

## 2018-10-21 ENCOUNTER — Ambulatory Visit (INDEPENDENT_AMBULATORY_CARE_PROVIDER_SITE_OTHER): Payer: Medicare Other | Admitting: Pharmacist Clinician (PhC)/ Clinical Pharmacy Specialist

## 2018-10-21 DIAGNOSIS — I824Y3 Acute embolism and thrombosis of unspecified deep veins of proximal lower extremity, bilateral: Secondary | ICD-10-CM

## 2018-10-21 DIAGNOSIS — I4819 Other persistent atrial fibrillation: Secondary | ICD-10-CM

## 2018-10-21 DIAGNOSIS — Z7901 Long term (current) use of anticoagulants: Secondary | ICD-10-CM

## 2018-10-21 LAB — POCT INR: INR: 3.1 — AB (ref 2.0–3.0)

## 2018-11-27 ENCOUNTER — Encounter: Payer: Self-pay | Admitting: Nurse Practitioner

## 2018-11-27 ENCOUNTER — Ambulatory Visit (INDEPENDENT_AMBULATORY_CARE_PROVIDER_SITE_OTHER): Payer: Medicare Other | Admitting: Nurse Practitioner

## 2018-11-27 ENCOUNTER — Other Ambulatory Visit: Payer: Self-pay

## 2018-11-27 ENCOUNTER — Ambulatory Visit (INDEPENDENT_AMBULATORY_CARE_PROVIDER_SITE_OTHER): Payer: Medicare Other

## 2018-11-27 VITALS — BP 138/82 | HR 81 | Temp 97.9°F | Ht 67.0 in | Wt 310.0 lb

## 2018-11-27 DIAGNOSIS — E785 Hyperlipidemia, unspecified: Secondary | ICD-10-CM

## 2018-11-27 DIAGNOSIS — I4819 Other persistent atrial fibrillation: Secondary | ICD-10-CM

## 2018-11-27 DIAGNOSIS — Z0000 Encounter for general adult medical examination without abnormal findings: Secondary | ICD-10-CM | POA: Diagnosis not present

## 2018-11-27 DIAGNOSIS — G4733 Obstructive sleep apnea (adult) (pediatric): Secondary | ICD-10-CM

## 2018-11-27 DIAGNOSIS — Z79899 Other long term (current) drug therapy: Secondary | ICD-10-CM | POA: Diagnosis not present

## 2018-11-27 DIAGNOSIS — I10 Essential (primary) hypertension: Secondary | ICD-10-CM | POA: Diagnosis not present

## 2018-11-27 DIAGNOSIS — R7303 Prediabetes: Secondary | ICD-10-CM | POA: Diagnosis not present

## 2018-11-27 DIAGNOSIS — Z23 Encounter for immunization: Secondary | ICD-10-CM

## 2018-11-27 DIAGNOSIS — R600 Localized edema: Secondary | ICD-10-CM

## 2018-11-27 LAB — POCT UA - MICROALBUMIN
Creatinine, POC: 100 mg/dL
Microalbumin Ur, POC: 80 mg/L

## 2018-11-27 LAB — POCT URINALYSIS DIPSTICK
Bilirubin, UA: NEGATIVE
Glucose, UA: NEGATIVE
Ketones, UA: NEGATIVE
Leukocytes, UA: NEGATIVE
Nitrite, UA: NEGATIVE
Protein, UA: POSITIVE — AB
Spec Grav, UA: 1.02 (ref 1.010–1.025)
Urobilinogen, UA: 0.2 E.U./dL
pH, UA: 6.5 (ref 5.0–8.0)

## 2018-11-27 NOTE — Progress Notes (Signed)
Subjective:     Patient ID: Brett [name] , male    DOB: 29-Jun-1945 , 73 y.o.   MRN: [id number]   Chief Complaint  Patient presents with  . Hypertension    HPI  He is here with his daughter Claiborne Billings  Hyperlipidemia - he is tolerating his medications well.  Wt Readings from Last 3 Encounters: 11/27/18 : (!) 310 lb (140.6 kg) 11/27/18 : (!) 310 lb (140.6 kg) 05/22/18 : (!) 319 lb (144.7 kg)   Hypertension This is a chronic problem. The current episode started more than 1 year ago. The problem has been gradually worsening since onset. The problem is uncontrolled. Pertinent negatives include no anxiety, chest pain, headaches or palpitations. There are no associated agents to hypertension. Risk factors for coronary artery disease include obesity and male gender. Past treatments include ACE inhibitors and alpha 1 blockers. The current treatment provides moderate improvement. There are no compliance problems.  There is no history of angina or kidney disease. There is no history of chronic renal disease.     Past Medical History:  Diagnosis Date  . DVT of lower extremity, bilateral ([place]) 06/17/2012   Right CFV and popliteal vein, left C. it the only;  also interstitial fluid noted in both calfs  . Dyslipidemia   . History of echocardiogram 02/07/2011   EF 50-60%; RV mildly dilated, LA moderately dilated; mild MR; mild-mod TR; RV systolic pressure elevated; mildly sclerotic AV  . History of nuclear stress test 02/07/2011   lexiscan; mild ischemia in mid inferolateral & apical lateral regions; low risk scan  . History of: Bacteremia due to coagulase-negative Staphylococcus 06/13/2012   During hospitalization May 30 through 06/19/2012   . History: TTP (thrombotic thrombocytopenic purpura) 06/11/2012  . Hypertension   . Obesity (BMI 30-39.9)   . OSA on CPAP   . Persistent atrial fibrillation ([place]) 02/2011   Negative Myoview; Normal EF by Echo  . Severe sepsis with acute organ dysfunction  ([place]) 5/30-6/12/2012   Prolonged hospitalization for presumed sepsis and septic shock compounded by coagulopathy thought to be TTP versus Pradaxa mediated. Temporarily on hemodialysis and plasmapheresis.     Family History  Problem Relation Age of Onset  . Heart failure Mother 6  . Cancer Father        stomach     Current Outpatient Medications:  .  acetaminophen (TYLENOL) 500 MG tablet, Take 1,000 mg by mouth daily. , Disp: , Rfl:  .  diltiazem (CARDIZEM CD) 240 MG 24 hr capsule, TAKE 1 CAPSULE DAILY, Disp: 90 capsule, Rfl: 1 .  diphenhydramine-acetaminophen (TYLENOL PM) 25-500 MG TABS, Take 2 tablets by mouth at bedtime., Disp: , Rfl:  .  fish oil-omega-3 fatty acids 1000 MG capsule, Take 1 g by mouth daily., Disp: , Rfl:  .  furosemide (LASIX) 20 MG tablet, TAKE 1 TABLET TWICE A DAY, MAY TAKE AN EXTRA 20 MG (1 TABLET) AS NEEDED DAILY (PLEASE SCHEDULE AN APPOINTMENT FOR REFILLS) (Patient taking differently: 20 mg. Take 2 tablets twice a day), Disp: 270 tablet, Rfl: 3 .  hydrALAZINE (APRESOLINE) 50 MG tablet, TAKE 1 TABLET TWICE A DAY WITH FOOD, Disp: 180 tablet, Rfl: 3 .  loratadine (CLARITIN) 10 MG tablet, TAKE 1 TABLET DAILY, Disp: 90 tablet, Rfl: 3 .  metoprolol tartrate (LOPRESSOR) 100 MG tablet, TAKE 1 TABLET THREE TIMES A DAY, Disp: 270 tablet, Rfl: 1 .  Multiple Vitamin (MULITIVITAMIN WITH MINERALS) TABS, Take 1 tablet by mouth daily., Disp: , Rfl:  .  NON FORMULARY, at bedtime. CPAP, Disp: , Rfl:  .  pravastatin (PRAVACHOL) 80 MG tablet, TAKE 1 TABLET DAILY, Disp: 90 tablet, Rfl: 1 .  psyllium (REGULOID) 0.52 g capsule, Take 5 capsules by mouth daily., Disp: , Rfl:  .  silver sulfADIAZINE (SILVADENE) 1 % cream, APPLY TOPICALLY TWICE A DAY A ONE-SIXTEENTH (1/16) INCH LAYER TO ENTIRE BURN AREA, Disp: 200 g, Rfl: 3 .  warfarin (COUMADIN) 4 MG tablet, TAKE ONE TO ONE AND ONE-HALF TABLETS DAILY AS DIRECTED PER COUMADIN CLINIC, Disp: 135 tablet, Rfl: 1   No Known Allergies   Review  of Systems  Constitutional: Negative.   Respiratory: Negative.   Cardiovascular: Negative for chest pain and palpitations.  Musculoskeletal: Negative.   Skin: Negative.   Neurological: Negative.  Negative for dizziness and headaches.  Psychiatric/Behavioral: Negative.      Today's Vitals   11/27/18 1108  BP: 138/82  Pulse: 81  Temp: 97.9 F (36.6 C)  TempSrc: Oral  SpO2: 99%  Weight: (!) 310 lb (140.6 kg)  Height: 5' 7"  (1.702 m)   Body mass index is 48.55 kg/m.   Objective:  Physical Exam Constitutional:      General: He is not in acute distress.    Appearance: Normal appearance. He is obese.  Musculoskeletal:     Right lower leg: Edema (1+, mild erythema) present.     Left lower leg: Edema (1+) present.  Skin:    General: Skin is warm.     Capillary Refill: Capillary refill takes less than 2 seconds.  Neurological:     General: No focal deficit present.     Mental Status: He is alert and oriented to person, place, and time.         Assessment And Plan:     1. Essential hypertension . B/P is fairly controlled.  . CMP ordered to check renal function.  . The importance of regular exercise and dietary modification was stressed to the patient.  - CMP14+EGFR - CCM Social Work  2. Prediabetes  Chronic, stable.  Continue with current medications  Encouraged to limit intake of sugary foods and drinks  Encouraged to increase physical activity to 150 minutes per week as tolerated to include chair exercises. - CMP14+EGFR - Hemoglobin A1c  3. Hyperlipidemia, unspecified hyperlipidemia type  Chronic, controlled  Continue with current medications  4. OSA (obstructive sleep apnea)  Chronic, tolerating his CPAP well and feels is benefiting his quality of life.  5. Severe obesity (BMI >= 40) (HCC)  Chronic, discussed eating a healthy diet and trying to be as active as possible.    6. Persistent atrial fibrillation (HCC)  Chronic, continue follow up with  cardiology  7. Other long term (current) drug therapy  - TSH  8. Bilateral leg edema  1+ edema improved since last office visit, no ulcers present   [name], FNP    THE PATIENT IS ENCOURAGED TO PRACTICE SOCIAL DISTANCING DUE TO THE COVID-19 PANDEMIC.

## 2018-11-27 NOTE — Progress Notes (Signed)
Subjective:   Brett [name] is a 73 y.o. male who presents for Medicare Annual/Subsequent preventive examination.  Review of Systems:  n/a Cardiac Risk Factors include: advanced age (>30men, >43 women);male gender;obesity (BMI >30kg/m2);hypertension     Objective:    Vitals: BP 138/82 (BP Location: Right Arm, Patient Position: Sitting, Cuff Size: Large)   Pulse 81   Temp 97.9 F (36.6 C) (Oral)   Ht 5\' 7"  (1.702 m)   Wt (!) 310 lb (140.6 kg)   SpO2 99%   BMI 48.55 kg/m   Body mass index is 48.55 kg/m.  Advanced Directives 11/27/2018 11/21/2017 06/11/2012 06/08/2011  Does Patient Have a Medical Advance Directive? Yes No Patient does not have advance directive Patient does not have advance directive  Type of Advance Directive [place];Living will - - -  Copy of [place] in Chart? No - copy requested - - -  Would patient like information on creating a medical advance directive? - Yes (MAU/Ambulatory/Procedural Areas - Information given) - -  Pre-existing out of facility DNR order (yellow form or pink MOST form) - - - No    Tobacco Social History   Tobacco Use  Smoking Status Former Smoker  . Types: Cigars  Smokeless Tobacco Never Used     Counseling given: Not Answered   Clinical Intake:  Pre-visit preparation completed: Yes  Pain : No/denies pain     Nutritional Status: BMI > 30  Obese Nutritional Risks: None Diabetes: Yes CBG done?: No Did pt. bring in CBG monitor from home?: No  How often do you need to have someone help you when you read instructions, pamphlets, or other written materials from your doctor or pharmacy?: 1 - Never What is the last grade level you completed in school?: 2 years college  Interpreter Needed?: No  Information entered by :: NAllen LPN  Past Medical History:  Diagnosis Date  . DVT of lower extremity, bilateral ([place]) 06/17/2012   Right CFV and popliteal vein, left C. it the only;  also  interstitial fluid noted in both calfs  . Dyslipidemia   . History of echocardiogram 02/07/2011   EF 50-60%; RV mildly dilated, LA moderately dilated; mild MR; mild-mod TR; RV systolic pressure elevated; mildly sclerotic AV  . History of nuclear stress test 02/07/2011   lexiscan; mild ischemia in mid inferolateral & apical lateral regions; low risk scan  . History of: Bacteremia due to coagulase-negative Staphylococcus 06/13/2012   During hospitalization May 30 through 06/19/2012   . History: TTP (thrombotic thrombocytopenic purpura) 06/11/2012  . Hypertension   . Obesity (BMI 30-39.9)   . OSA on CPAP   . Persistent atrial fibrillation ([place]) 02/2011   Negative Myoview; Normal EF by Echo  . Severe sepsis with acute organ dysfunction ([place]) 5/30-6/12/2012   Prolonged hospitalization for presumed sepsis and septic shock compounded by coagulopathy thought to be TTP versus Pradaxa mediated. Temporarily on hemodialysis and plasmapheresis.   Past Surgical History:  Procedure Laterality Date  . CARDIOVERSION  06/08/2011   Procedure: CARDIOVERSION;  Surgeon: Leonie Man, MD;  Location: [facility];  Service: Cardiovascular;  Laterality: N/A;  . TOTAL KNEE ARTHROPLASTY     Left knee 2010, Right knee 2011   Family History  Problem Relation Age of Onset  . Heart failure Mother 25  . Cancer Father        stomach   Social History   Socioeconomic History  . Marital status: Divorced  Spouse name: Not on file  . Number of children: 1  . Years of education: Not on file  . Highest education level: Not on file  Occupational History  . Occupation: retired  Scientific laboratory technician  . Financial resource strain: Not hard at all  . Food insecurity    Worry: Never true    Inability: Never true  . Transportation needs    Medical: No    Non-medical: No  Tobacco Use  . Smoking status: Former Smoker    Types: Cigars  . Smokeless tobacco: Never Used  Substance and Sexual Activity  . Alcohol use: No    Comment:  couple beers a year  . Drug use: No  . Sexual activity: Not Currently  Lifestyle  . Physical activity    Days per week: 4 days    Minutes per session: 10 min  . Stress: Not at all  Relationships  . Social Herbalist on phone: Not on file    Gets together: Not on file    Attends religious service: Not on file    Active member of club or organization: Not on file    Attends meetings of clubs or organizations: Not on file    Relationship status: Not on file  Other Topics Concern  . Not on file  Social History Narrative   He is a divorced father of one. Prior to this hospitalization he was walking 5-10 minutes a day 4 times a week. He now is gradually building up his rehabilitation doing mild exercise daily.      He quit smoking in 1968 and rarely has alcohol.    Outpatient Encounter Medications as of 11/27/2018  Medication Sig  . acetaminophen (TYLENOL) 500 MG tablet Take 1,000 mg by mouth daily.   Marland Kitchen diltiazem (CARDIZEM CD) 240 MG 24 hr capsule TAKE 1 CAPSULE DAILY  . diphenhydramine-acetaminophen (TYLENOL PM) 25-500 MG TABS Take 2 tablets by mouth at bedtime.  . fish oil-omega-3 fatty acids 1000 MG capsule Take 1 g by mouth daily.  . furosemide (LASIX) 20 MG tablet TAKE 1 TABLET TWICE A DAY, MAY TAKE AN EXTRA 20 MG (1 TABLET) AS NEEDED DAILY (PLEASE SCHEDULE AN APPOINTMENT FOR REFILLS) (Patient taking differently: 20 mg. Take 2 tablets twice a day)  . hydrALAZINE (APRESOLINE) 50 MG tablet TAKE 1 TABLET TWICE A DAY WITH FOOD  . loratadine (CLARITIN) 10 MG tablet TAKE 1 TABLET DAILY  . metoprolol tartrate (LOPRESSOR) 100 MG tablet TAKE 1 TABLET THREE TIMES A DAY  . Multiple Vitamin (MULITIVITAMIN WITH MINERALS) TABS Take 1 tablet by mouth daily.  . NON FORMULARY at bedtime. CPAP  . pravastatin (PRAVACHOL) 80 MG tablet TAKE 1 TABLET DAILY  . psyllium (REGULOID) 0.52 g capsule Take 5 capsules by mouth daily.  . silver sulfADIAZINE (SILVADENE) 1 % cream APPLY TOPICALLY TWICE  A DAY A ONE-SIXTEENTH (1/16) INCH LAYER TO ENTIRE BURN AREA  . warfarin (COUMADIN) 4 MG tablet TAKE ONE TO ONE AND ONE-HALF TABLETS DAILY AS DIRECTED PER COUMADIN CLINIC   No facility-administered encounter medications on file as of 11/27/2018.     Activities of Daily Living In your present state of health, do you have any difficulty performing the following activities: 11/27/2018  Hearing? N  Vision? N  Difficulty concentrating or making decisions? N  Walking or climbing stairs? N  Dressing or bathing? N  Doing errands, shopping? N  Preparing Food and eating ? N  Using the Toilet? N  In the  past six months, have you accidently leaked urine? N  Do you have problems with loss of bowel control? N  Managing your Medications? N  Managing your Finances? N  Housekeeping or managing your Housekeeping? N  Some recent data might be hidden    Patient Care Team: [name], FNP as PCP - General ([place]) Suella Broad, FNP as Nurse Practitioner (Psychiatry)   Assessment:   This is a routine wellness examination for Brett [name].  Exercise Activities and Dietary recommendations Current Exercise Habits: Home exercise routine, Type of exercise: walking(yard work), Time (Minutes): 10, Frequency (Times/Week): 4, Weekly Exercise (Minutes/Week): 40  Goals    . DIET - INCREASE WATER INTAKE (pt-stated)     Decrease salt, lose weight    . Patient Stated     11/27/2018, wants to avoid covid-19       Fall Risk Fall Risk  11/27/2018 05/22/2018 11/21/2017 11/21/2017 08/06/2016  Falls in the past year? 0 0 0 0 No  Comment - - - - [place] Telephone Survey: data to providers prior to load  Risk for fall due to : Impaired mobility;Impaired balance/gait;Medication side effect - Medication side effect - -  Follow up Falls evaluation completed;Education provided;Falls prevention discussed - - - -   Is the patient's home free of loose throw rugs in walkways, pet beds, electrical cords,  etc?   yes      Grab bars in the bathroom? yes      Handrails on the stairs?   yes      Adequate lighting?   yes  Timed Get Up and Go Performed: n/a  Depression Screen PHQ 2/9 Scores 11/27/2018 05/22/2018 11/21/2017 11/21/2017  PHQ - 2 Score 0 0 0 0  PHQ- 9 Score 0 - - -    Cognitive Function     6CIT Screen 11/27/2018 11/21/2017  What Year? 0 points 0 points  What month? 0 points 0 points  What time? 0 points 0 points  Count back from 20 0 points 0 points  Months in reverse 0 points 0 points  Repeat phrase 4 points 0 points  Total Score 4 0    Immunization History  Administered Date(s) Administered  . Influenza, High Dose Seasonal PF 11/27/2018  . Influenza-Unspecified 12/08/2012    Qualifies for Shingles Vaccine? yes  Screening Tests Health Maintenance  Topic Date Due  . PNA vac Low Risk Adult (1 of 2 - PCV13) 11/27/2019 (Originally 11/29/2009)  . COLONOSCOPY  06/29/2020  . TETANUS/TDAP  09/16/2022  . INFLUENZA VACCINE  Completed  . Hepatitis C Screening  Completed   Cancer Screenings: Lung: Low Dose CT Chest recommended if Age 62-80 years, 30 pack-year currently smoking OR have quit w/in 15years. Patient does not qualify. Colorectal: up to date  Additional Screenings:  Hepatitis C Screening:05/22/2018      Plan:    Patient wants to avoid covid-19.  I have personally reviewed and noted the following in the patient's chart:   . Medical and social history . Use of alcohol, tobacco or illicit drugs  . Current medications and supplements . Functional ability and status . Nutritional status . Physical activity . Advanced directives . List of other physicians . Hospitalizations, surgeries, and ER visits in previous 12 months . Vitals . Screenings to include cognitive, depression, and falls . Referrals and appointments  In addition, I have reviewed and discussed with patient certain preventive protocols, quality metrics, and best practice recommendations.  A written personalized care plan for preventive  services as well as general preventive health recommendations were provided to patient.     Kellie Simmering, LPN  [phone]

## 2018-11-27 NOTE — Patient Instructions (Signed)
Brett [name] , Thank you for taking time to come for your Medicare Wellness Visit. I appreciate your ongoing commitment to your health goals. Please review the following plan we discussed and let me know if I can assist you in the future.   Screening recommendations/referrals: Colonoscopy: 06/2010 Recommended yearly ophthalmology/optometry visit for glaucoma screening and checkup Recommended yearly dental visit for hygiene and checkup  Vaccinations: Influenza vaccine: today Pneumococcal vaccine: declined Tdap vaccine: 09/2012 Shingles vaccine: discussed    Advanced directives: Please bring a copy of your POA (Power of Attorney) and/or Living Will to your next appointment.    Conditions/risks identified: obesity  Next appointment: 05/28/2019 at 11:30  Preventive Care 39 Years and Older, Male Preventive care refers to lifestyle choices and visits with your health care provider that can promote health and wellness. What does preventive care include?  A yearly physical exam. This is also called an annual well check.  Dental exams once or twice a year.  Routine eye exams. Ask your health care provider how often you should have your eyes checked.  Personal lifestyle choices, including:  Daily care of your teeth and gums.  Regular physical activity.  Eating a healthy diet.  Avoiding tobacco and drug use.  Limiting alcohol use.  Practicing safe sex.  Taking low doses of aspirin every day.  Taking vitamin and mineral supplements as recommended by your health care provider. What happens during an annual well check? The services and screenings done by your health care provider during your annual well check will depend on your age, overall health, lifestyle risk factors, and family history of disease. Counseling  Your health care provider may ask you questions about your:  Alcohol use.  Tobacco use.  Drug use.  Emotional well-being.  Home and relationship well-being.   Sexual activity.  Eating habits.  History of falls.  Memory and ability to understand (cognition).  Work and work Statistician. Screening  You may have the following tests or measurements:  Height, weight, and BMI.  Blood pressure.  Lipid and cholesterol levels. These may be checked every 5 years, or more frequently if you are over 86 years old.  Skin check.  Lung cancer screening. You may have this screening every year starting at age 25 if you have a 30-pack-year history of smoking and currently smoke or have quit within the past 15 years.  Fecal occult blood test (FOBT) of the stool. You may have this test every year starting at age 56.  Flexible sigmoidoscopy or colonoscopy. You may have a sigmoidoscopy every 5 years or a colonoscopy every 10 years starting at age 56.  Prostate cancer screening. Recommendations will vary depending on your family history and other risks.  Hepatitis C blood test.  Hepatitis B blood test.  Sexually transmitted disease (STD) testing.  Diabetes screening. This is done by checking your blood sugar (glucose) after you have not eaten for a while (fasting). You may have this done every 1-3 years.  Abdominal aortic aneurysm (AAA) screening. You may need this if you are a current or former smoker.  Osteoporosis. You may be screened starting at age 54 if you are at high risk. Talk with your health care provider about your test results, treatment options, and if necessary, the need for more tests. Vaccines  Your health care provider may recommend certain vaccines, such as:  Influenza vaccine. This is recommended every year.  Tetanus, diphtheria, and acellular pertussis (Tdap, Td) vaccine. You may need a Td booster every  10 years.  Zoster vaccine. You may need this after age 5.  Pneumococcal 13-valent conjugate (PCV13) vaccine. One dose is recommended after age 12.  Pneumococcal polysaccharide (PPSV23) vaccine. One dose is recommended after  age 31. Talk to your health care provider about which screenings and vaccines you need and how often you need them. This information is not intended to replace advice given to you by your health care provider. Make sure you discuss any questions you have with your health care provider. Document Released: 01/21/2015 Document Revised: 09/14/2015 Document Reviewed: 10/26/2014 Elsevier Interactive Patient Education  2017 [employer] Prevention in the Home Falls can cause injuries. They can happen to people of all ages. There are many things you can do to make your home safe and to help prevent falls. What can I do on the outside of my home?  Regularly fix the edges of walkways and driveways and fix any cracks.  Remove anything that might make you trip as you walk through a door, such as a raised step or threshold.  Trim any bushes or trees on the path to your home.  Use bright outdoor lighting.  Clear any walking paths of anything that might make someone trip, such as rocks or tools.  Regularly check to see if handrails are loose or broken. Make sure that both sides of any steps have handrails.  Any raised decks and porches should have guardrails on the edges.  Have any leaves, snow, or ice cleared regularly.  Use sand or salt on walking paths during winter.  Clean up any spills in your garage right away. This includes oil or grease spills. What can I do in the bathroom?  Use night lights.  Install grab bars by the toilet and in the tub and shower. Do not use towel bars as grab bars.  Use non-skid mats or decals in the tub or shower.  If you need to sit down in the shower, use a plastic, non-slip stool.  Keep the floor dry. Clean up any water that spills on the floor as soon as it happens.  Remove soap buildup in the tub or shower regularly.  Attach bath mats securely with double-sided non-slip rug tape.  Do not have throw rugs and other things on the floor that can  make you trip. What can I do in the bedroom?  Use night lights.  Make sure that you have a light by your bed that is easy to reach.  Do not use any sheets or blankets that are too big for your bed. They should not hang down onto the floor.  Have a firm chair that has side arms. You can use this for support while you get dressed.  Do not have throw rugs and other things on the floor that can make you trip. What can I do in the kitchen?  Clean up any spills right away.  Avoid walking on wet floors.  Keep items that you use a lot in easy-to-reach places.  If you need to reach something above you, use a strong step stool that has a grab bar.  Keep electrical cords out of the way.  Do not use floor polish or wax that makes floors slippery. If you must use wax, use non-skid floor wax.  Do not have throw rugs and other things on the floor that can make you trip. What can I do with my stairs?  Do not leave any items on the stairs.  Make sure  that there are handrails on both sides of the stairs and use them. Fix handrails that are broken or loose. Make sure that handrails are as long as the stairways.  Check any carpeting to make sure that it is firmly attached to the stairs. Fix any carpet that is loose or worn.  Avoid having throw rugs at the top or bottom of the stairs. If you do have throw rugs, attach them to the floor with carpet tape.  Make sure that you have a light switch at the top of the stairs and the bottom of the stairs. If you do not have them, ask someone to add them for you. What else can I do to help prevent falls?  Wear shoes that:  Do not have high heels.  Have rubber bottoms.  Are comfortable and fit you well.  Are closed at the toe. Do not wear sandals.  If you use a stepladder:  Make sure that it is fully opened. Do not climb a closed stepladder.  Make sure that both sides of the stepladder are locked into place.  Ask someone to hold it for you,  if possible.  Clearly mark and make sure that you can see:  Any grab bars or handrails.  First and last steps.  Where the edge of each step is.  Use tools that help you move around (mobility aids) if they are needed. These include:  Canes.  Walkers.  Scooters.  Crutches.  Turn on the lights when you go into a dark area. Replace any light bulbs as soon as they burn out.  Set up your furniture so you have a clear path. Avoid moving your furniture around.  If any of your floors are uneven, fix them.  If there are any pets around you, be aware of where they are.  Review your medicines with your doctor. Some medicines can make you feel dizzy. This can increase your chance of falling. Ask your doctor what other things that you can do to help prevent falls. This information is not intended to replace advice given to you by your health care provider. Make sure you discuss any questions you have with your health care provider. Document Released: 10/21/2008 Document Revised: 06/02/2015 Document Reviewed: 01/29/2014 Elsevier Interactive Patient Education  2017 [employer].

## 2018-11-28 LAB — CMP14+EGFR
ALT: 16 IU/L (ref 0–44)
AST: 31 IU/L (ref 0–40)
Albumin/Globulin Ratio: 1.3 (ref 1.2–2.2)
Albumin: 4.5 g/dL (ref 3.7–4.7)
Alkaline Phosphatase: 86 IU/L (ref 39–117)
BUN/Creatinine Ratio: 22 (ref 10–24)
BUN: 27 mg/dL (ref 8–27)
Bilirubin Total: 0.6 mg/dL (ref 0.0–1.2)
CO2: 24 mmol/L (ref 20–29)
Calcium: 9.5 mg/dL (ref 8.6–10.2)
Chloride: 99 mmol/L (ref 96–106)
Creatinine, Ser: 1.24 mg/dL (ref 0.76–1.27)
GFR calc Af Amer: 66 mL/min/{1.73_m2} (ref >59)
GFR calc non Af Amer: 57 mL/min/{1.73_m2} — ABNORMAL LOW (ref >59)
Globulin, Total: 3.5 g/dL (ref 1.5–4.5)
Glucose: 120 mg/dL — ABNORMAL HIGH (ref 65–99)
Potassium: 4.7 mmol/L (ref 3.5–5.2)
Sodium: 140 mmol/L (ref 134–144)
Total Protein: 8 g/dL (ref 6.0–8.5)

## 2018-11-28 LAB — HEMOGLOBIN A1C
Est. average glucose Bld gHb Est-mCnc: 143 mg/dL
Hgb A1c MFr Bld: 6.6 % — ABNORMAL HIGH (ref 4.8–5.6)

## 2018-11-28 LAB — TSH: TSH: 2.46 u[IU]/mL (ref 0.450–4.500)

## 2018-12-08 ENCOUNTER — Ambulatory Visit (INDEPENDENT_AMBULATORY_CARE_PROVIDER_SITE_OTHER): Payer: Medicare Other | Admitting: Pharmacist

## 2018-12-08 ENCOUNTER — Other Ambulatory Visit: Payer: Self-pay

## 2018-12-08 DIAGNOSIS — I4819 Other persistent atrial fibrillation: Secondary | ICD-10-CM

## 2018-12-08 DIAGNOSIS — Z7901 Long term (current) use of anticoagulants: Secondary | ICD-10-CM

## 2018-12-08 LAB — POCT INR: INR: 3.5 — AB (ref 2.0–3.0)

## 2018-12-10 ENCOUNTER — Ambulatory Visit: Payer: Self-pay

## 2018-12-10 DIAGNOSIS — I10 Essential (primary) hypertension: Secondary | ICD-10-CM

## 2018-12-10 NOTE — Chronic Care Management (AMB) (Signed)
  Chronic Care Management   Outreach Note  12/10/2018 Name: Brett [name] MRN: [id number] DOB: Oct 28, 1945  Referred by: [name], FNP Reason for referral : Care Coordination   A second unsuccessful telephone outreach was attempted today. The patient was referred to the case management team for assistance with care management and care coordination.   Follow Up Plan: A HIPPA compliant phone message was left for the patient providing contact information and requesting a return call.  The care management team will reach out to the patient again over the next 14 days.   Daneen Schick, BSW, CDP Social Worker, Certified Dementia Practitioner [facility] [phone]

## 2018-12-23 ENCOUNTER — Ambulatory Visit: Payer: Self-pay

## 2018-12-23 DIAGNOSIS — I10 Essential (primary) hypertension: Secondary | ICD-10-CM

## 2018-12-23 NOTE — Chronic Care Management (AMB) (Signed)
  Chronic Care Management   Outreach Note  12/23/2018 Name: Brett [name] MRN: [id number] DOB: 1945/03/07  Referred by: [name], FNP Reason for referral : Care Coordination   A second unsuccessful telephone outreach was attempted today. The patient was referred to the case management team for assistance with care management and care coordination.   Follow Up Plan: A HIPPA compliant phone message was left for the patient providing contact information and requesting a return call.  The care management team will reach out to the patient again over the next 21 days.   Daneen Schick, BSW, CDP Social Worker, Certified Dementia Practitioner [facility] [phone]

## 2019-01-06 ENCOUNTER — Ambulatory Visit (INDEPENDENT_AMBULATORY_CARE_PROVIDER_SITE_OTHER): Payer: Medicare Other | Admitting: Pharmacist Clinician (PhC)/ Clinical Pharmacy Specialist

## 2019-01-06 ENCOUNTER — Other Ambulatory Visit: Payer: Self-pay

## 2019-01-06 DIAGNOSIS — Z7901 Long term (current) use of anticoagulants: Secondary | ICD-10-CM

## 2019-01-06 DIAGNOSIS — I4819 Other persistent atrial fibrillation: Secondary | ICD-10-CM | POA: Diagnosis not present

## 2019-01-06 LAB — POCT INR: INR: 4 — AB (ref 2.0–3.0)

## 2019-01-12 ENCOUNTER — Other Ambulatory Visit: Payer: Self-pay | Admitting: Nurse Practitioner

## 2019-01-13 ENCOUNTER — Ambulatory Visit: Payer: Self-pay

## 2019-01-13 DIAGNOSIS — E785 Hyperlipidemia, unspecified: Secondary | ICD-10-CM

## 2019-01-13 DIAGNOSIS — I10 Essential (primary) hypertension: Secondary | ICD-10-CM

## 2019-01-13 NOTE — Chronic Care Management (AMB) (Signed)
  Chronic Care Management   Outreach Note  01/13/2019 Name: Brett [name] MRN: [id number] DOB: 05-20-1945  Referred by: [name], FNP Reason for referral : Care Coordination   Third unsuccessful telephone outreach was attempted today. The patient was referred to the case management team for assistance with care management and care coordination. The patient's primary care provider has been notified of our unsuccessful attempts to make or maintain contact with the patient. The care management team is pleased to engage with this patient at any time in the future should he/she be interested in assistance from the care management team.    Daneen Schick, BSW, CDP Social Worker, Certified Dementia Practitioner [facility] [phone]

## 2019-01-30 ENCOUNTER — Other Ambulatory Visit: Payer: Self-pay

## 2019-01-30 ENCOUNTER — Ambulatory Visit (INDEPENDENT_AMBULATORY_CARE_PROVIDER_SITE_OTHER): Payer: Medicare Other | Admitting: Pharmacist Clinician (PhC)/ Clinical Pharmacy Specialist

## 2019-01-30 ENCOUNTER — Encounter: Payer: Self-pay | Admitting: Pharmacist Clinician (PhC)/ Clinical Pharmacy Specialist

## 2019-01-30 DIAGNOSIS — I4819 Other persistent atrial fibrillation: Secondary | ICD-10-CM

## 2019-01-30 DIAGNOSIS — Z7901 Long term (current) use of anticoagulants: Secondary | ICD-10-CM

## 2019-01-30 LAB — POCT INR: INR: 2.8 (ref 2.0–3.0)

## 2019-01-30 MED ORDER — WARFARIN SODIUM 4 MG PO TABS: ORAL_TABLET | ORAL | 1 refills | Status: DC

## 2019-02-06 ENCOUNTER — Other Ambulatory Visit: Payer: Self-pay | Admitting: Nurse Practitioner

## 2019-02-22 ENCOUNTER — Ambulatory Visit: Payer: Medicare Other | Attending: Nurse Practitioner

## 2019-02-22 DIAGNOSIS — Z23 Encounter for immunization: Secondary | ICD-10-CM

## 2019-02-22 NOTE — Progress Notes (Signed)
   Covid-19 Vaccination Clinic  Name:  Brett [name]    MRN: [id number] DOB: Nov 17, 1945  02/22/2019  Mr. [name] was observed post Covid-19 immunization for 15 minutes without incidence. He was provided with Vaccine Information Sheet and instruction to access the V-Safe system.   Mr. [name] was instructed to call 911 with any severe reactions post vaccine: Marland Kitchen Difficulty breathing  . Swelling of your face and throat  . A fast heartbeat  . A bad rash all over your body  . Dizziness and weakness    Immunizations Administered    Name Date Dose VIS Date Route   Pfizer COVID-19 Vaccine 02/22/2019 11:41 AM 0.3 mL 12/19/2018 Intramuscular   Manufacturer: [place]   Lot: [id number]   [place]: [id number]

## 2019-03-11 ENCOUNTER — Other Ambulatory Visit: Payer: Self-pay

## 2019-03-11 ENCOUNTER — Ambulatory Visit (INDEPENDENT_AMBULATORY_CARE_PROVIDER_SITE_OTHER): Payer: Medicare Other | Admitting: Pharmacist Clinician (PhC)/ Clinical Pharmacy Specialist

## 2019-03-11 DIAGNOSIS — I4819 Other persistent atrial fibrillation: Secondary | ICD-10-CM | POA: Diagnosis not present

## 2019-03-11 DIAGNOSIS — Z7901 Long term (current) use of anticoagulants: Secondary | ICD-10-CM

## 2019-03-11 LAB — POCT INR: INR: 1.5 — AB (ref 2.0–3.0)

## 2019-03-11 NOTE — Patient Instructions (Signed)
Take 2 tablets today Wednesday March 3, then continue to 1.5 tablets daily except 1 tablet on Sundays and Wednesdays.  Repeat INR in 3 weeks.

## 2019-03-24 ENCOUNTER — Ambulatory Visit: Payer: Medicare Other | Attending: Internal Medicine

## 2019-03-24 DIAGNOSIS — Z23 Encounter for immunization: Secondary | ICD-10-CM

## 2019-03-24 NOTE — Progress Notes (Signed)
   Covid-19 Vaccination Clinic  Name:  Brett [name]    MRN: [id number] DOB: 12-18-1945  03/24/2019  [name] was observed post Covid-19 immunization for 15 minutes without incident. He was provided with Vaccine Information Sheet and instruction to access the V-Safe system.   [name] was instructed to call 911 with any severe reactions post vaccine: Marland Kitchen Difficulty breathing  . Swelling of face and throat  . A fast heartbeat  . A bad rash all over body  . Dizziness and weakness   Immunizations Administered    Name Date Dose VIS Date Route   Pfizer COVID-19 Vaccine 03/24/2019 12:48 PM 0.3 mL 12/19/2018 Intramuscular   Manufacturer: [place]   Lot: [id number]   [place]: [id number]

## 2019-04-01 ENCOUNTER — Other Ambulatory Visit: Payer: Self-pay | Admitting: Cardiology

## 2019-04-03 ENCOUNTER — Other Ambulatory Visit: Payer: Self-pay

## 2019-04-03 ENCOUNTER — Ambulatory Visit (INDEPENDENT_AMBULATORY_CARE_PROVIDER_SITE_OTHER): Payer: Medicare Other | Admitting: Pharmacist Clinician (PhC)/ Clinical Pharmacy Specialist

## 2019-04-03 DIAGNOSIS — I4819 Other persistent atrial fibrillation: Secondary | ICD-10-CM | POA: Diagnosis not present

## 2019-04-03 DIAGNOSIS — Z7901 Long term (current) use of anticoagulants: Secondary | ICD-10-CM | POA: Diagnosis not present

## 2019-04-03 LAB — POCT INR: INR: 2.3 (ref 2.0–3.0)

## 2019-04-03 NOTE — Patient Instructions (Signed)
Continue to 1.5 tablets daily except 1 tablet on Sundays and Wednesdays.  Repeat INR in 4 weeks.

## 2019-04-28 ENCOUNTER — Other Ambulatory Visit: Payer: Self-pay | Admitting: Nurse Practitioner

## 2019-05-01 ENCOUNTER — Ambulatory Visit (INDEPENDENT_AMBULATORY_CARE_PROVIDER_SITE_OTHER): Payer: Medicare Other | Admitting: Pharmacist Clinician (PhC)/ Clinical Pharmacy Specialist

## 2019-05-01 ENCOUNTER — Other Ambulatory Visit: Payer: Self-pay

## 2019-05-01 DIAGNOSIS — I4819 Other persistent atrial fibrillation: Secondary | ICD-10-CM

## 2019-05-01 DIAGNOSIS — Z7901 Long term (current) use of anticoagulants: Secondary | ICD-10-CM

## 2019-05-01 LAB — POCT INR: INR: 2.4 (ref 2.0–3.0)

## 2019-05-28 ENCOUNTER — Other Ambulatory Visit: Payer: Self-pay

## 2019-05-28 ENCOUNTER — Ambulatory Visit (INDEPENDENT_AMBULATORY_CARE_PROVIDER_SITE_OTHER): Payer: Medicare Other | Admitting: Nurse Practitioner

## 2019-05-28 ENCOUNTER — Encounter: Payer: Self-pay | Admitting: Nurse Practitioner

## 2019-05-28 VITALS — BP 140/82 | HR 92 | Temp 98.0°F | Ht 66.0 in | Wt 316.8 lb

## 2019-05-28 DIAGNOSIS — G4733 Obstructive sleep apnea (adult) (pediatric): Secondary | ICD-10-CM

## 2019-05-28 DIAGNOSIS — I4819 Other persistent atrial fibrillation: Secondary | ICD-10-CM | POA: Diagnosis not present

## 2019-05-28 DIAGNOSIS — R7303 Prediabetes: Secondary | ICD-10-CM

## 2019-05-28 DIAGNOSIS — E785 Hyperlipidemia, unspecified: Secondary | ICD-10-CM | POA: Diagnosis not present

## 2019-05-28 DIAGNOSIS — I10 Essential (primary) hypertension: Secondary | ICD-10-CM | POA: Diagnosis not present
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
## 2019-05-29 LAB — CMP14+EGFR
ALT: 16 IU/L (ref 0–44)
AST: 24 IU/L (ref 0–40)
Albumin/Globulin Ratio: 1.1 — ABNORMAL LOW (ref 1.2–2.2)
Albumin: 4.1 g/dL (ref 3.7–4.7)
Alkaline Phosphatase: 84 IU/L (ref 48–121)
BUN/Creatinine Ratio: 15 (ref 10–24)
BUN: 21 mg/dL (ref 8–27)
Bilirubin Total: 0.7 mg/dL (ref 0.0–1.2)
CO2: 23 mmol/L (ref 20–29)
Calcium: 9 mg/dL (ref 8.6–10.2)
Chloride: 98 mmol/L (ref 96–106)
Creatinine, Ser: 1.36 mg/dL — ABNORMAL HIGH (ref 0.76–1.27)
GFR calc Af Amer: 59 mL/min/{1.73_m2} — ABNORMAL LOW (ref >59)
GFR calc non Af Amer: 51 mL/min/{1.73_m2} — ABNORMAL LOW (ref >59)
Globulin, Total: 3.8 g/dL (ref 1.5–4.5)
Glucose: 133 mg/dL — ABNORMAL HIGH (ref 65–99)
Potassium: 4.1 mmol/L (ref 3.5–5.2)
Sodium: 138 mmol/L (ref 134–144)
Total Protein: 7.9 g/dL (ref 6.0–8.5)

## 2019-05-29 LAB — LIPID PANEL
Chol/HDL Ratio: 3.9 ratio (ref 0.0–5.0)
Cholesterol, Total: 101 mg/dL (ref 100–199)
HDL: 26 mg/dL — ABNORMAL LOW (ref >39)
LDL Chol Calc (NIH): 52 mg/dL (ref 0–99)
Triglycerides: 130 mg/dL (ref 0–149)
VLDL Cholesterol Cal: 23 mg/dL (ref 5–40)

## 2019-05-29 LAB — HEMOGLOBIN A1C
Est. average glucose Bld gHb Est-mCnc: 151 mg/dL
Hgb A1c MFr Bld: 6.9 % — ABNORMAL HIGH (ref 4.8–5.6)

## 2019-06-09 ENCOUNTER — Other Ambulatory Visit: Payer: Self-pay | Admitting: Internal Medicine

## 2019-06-09 ENCOUNTER — Other Ambulatory Visit: Payer: Self-pay

## 2019-06-09 MED ORDER — DAPAGLIFLOZIN PROPANEDIOL 5 MG PO TABS
5.0000 mg | ORAL_TABLET | Freq: Every day | ORAL | 0 refills | Status: DC
Start: 2019-06-09 — End: 2019-09-09
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
## 2019-06-10 ENCOUNTER — Other Ambulatory Visit: Payer: Self-pay

## 2019-06-10 MED ORDER — EMPAGLIFLOZIN 10 MG PO TABS
10.0000 mg | ORAL_TABLET | Freq: Every day | ORAL | 0 refills | Status: DC
Start: 2019-06-10 — End: 2020-01-13

## 2019-06-12 ENCOUNTER — Ambulatory Visit (INDEPENDENT_AMBULATORY_CARE_PROVIDER_SITE_OTHER): Payer: Medicare Other | Admitting: Pharmacist

## 2019-06-12 ENCOUNTER — Other Ambulatory Visit: Payer: Self-pay

## 2019-06-12 DIAGNOSIS — I4819 Other persistent atrial fibrillation: Secondary | ICD-10-CM | POA: Diagnosis not present

## 2019-06-12 DIAGNOSIS — Z7901 Long term (current) use of anticoagulants: Secondary | ICD-10-CM | POA: Diagnosis not present

## 2019-06-12 LAB — POCT INR: INR: 2.6 (ref 2.0–3.0)

## 2019-07-11 ENCOUNTER — Other Ambulatory Visit: Payer: Self-pay | Admitting: Cardiology

## 2019-07-25 ENCOUNTER — Other Ambulatory Visit: Payer: Self-pay | Admitting: Nurse Practitioner

## 2019-08-05 ENCOUNTER — Other Ambulatory Visit: Payer: Self-pay

## 2019-08-05 ENCOUNTER — Ambulatory Visit (INDEPENDENT_AMBULATORY_CARE_PROVIDER_SITE_OTHER): Payer: Medicare Other

## 2019-08-05 DIAGNOSIS — I4819 Other persistent atrial fibrillation: Secondary | ICD-10-CM | POA: Diagnosis not present

## 2019-08-05 DIAGNOSIS — Z7901 Long term (current) use of anticoagulants: Secondary | ICD-10-CM | POA: Diagnosis not present

## 2019-08-05 DIAGNOSIS — Z5181 Encounter for therapeutic drug level monitoring: Secondary | ICD-10-CM

## 2019-08-05 LAB — POCT INR: INR: 2.2 (ref 2.0–3.0)

## 2019-08-05 NOTE — Patient Instructions (Signed)
Continue to 1.5 tablets daily except 1 tablet on Sundays and Wednesdays.  Repeat INR in 8 weeks.   

## 2019-09-09 ENCOUNTER — Other Ambulatory Visit: Payer: Self-pay

## 2019-09-09 ENCOUNTER — Encounter: Payer: Self-pay | Admitting: Nurse Practitioner

## 2019-09-09 ENCOUNTER — Ambulatory Visit (INDEPENDENT_AMBULATORY_CARE_PROVIDER_SITE_OTHER): Payer: Medicare Other | Admitting: Nurse Practitioner

## 2019-09-09 VITALS — BP 116/80 | HR 72 | Temp 97.5°F | Ht 66.0 in | Wt 310.2 lb

## 2019-09-09 DIAGNOSIS — I4819 Other persistent atrial fibrillation: Secondary | ICD-10-CM

## 2019-09-09 DIAGNOSIS — Z23 Encounter for immunization: Secondary | ICD-10-CM | POA: Diagnosis not present

## 2019-09-09 DIAGNOSIS — E119 Type 2 diabetes mellitus without complications: Secondary | ICD-10-CM

## 2019-09-09 DIAGNOSIS — I10 Essential (primary) hypertension: Secondary | ICD-10-CM

## 2019-09-09 DIAGNOSIS — E785 Hyperlipidemia, unspecified: Secondary | ICD-10-CM

## 2019-09-09 DIAGNOSIS — E11620 Type 2 diabetes mellitus with diabetic dermatitis: Secondary | ICD-10-CM | POA: Insufficient documentation

## 2019-09-09 HISTORY — DX: Type 2 diabetes mellitus without complications: E11.9

## 2019-09-09 MED ORDER — BLOOD GLUCOSE MONITOR KIT: PACK | 0 refills | Status: AC

## 2019-09-09 MED ORDER — TRULICITY 0.75 MG/0.5ML ~~LOC~~ SOAJ: 0.7500 mg | SUBCUTANEOUS | 1 refills | Status: DC

## 2019-09-09 NOTE — Progress Notes (Signed)
I,Yamilka Roman [employer] as a Education administrator for [employer], FNP.,have documented all relevant documentation on the behalf of [name], FNP,as directed by  [name], FNP while in the presence of [name], [place].  This visit occurred during the SARS-CoV-2 public health emergency.  Safety protocols were in place, including screening questions prior to the visit, additional usage of staff PPE, and extensive cleaning of exam room while observing appropriate contact time as indicated for disinfecting solutions.  Subjective:     Patient ID: Brett [name] , male    DOB: 09/13/1945 , 74 y.o.   MRN: [id number]   Chief Complaint  Patient presents with  . Hypertension    HPI   Hyperlipidemia - he is tolerating his medications well.  Wt Readings from Last 3 Encounters: 09/09/19 : (!) 310 lb 3.2 oz (140.7 kg) 05/28/19 : (!) 316 lb 12.8 oz (143.7 kg) 11/27/18 : (!) 310 lb (140.6 kg)  He is exercising by doing yard work.     Hypertension This is a chronic problem. The current episode started more than 1 year ago. The problem has been gradually worsening since onset. The problem is uncontrolled. Pertinent negatives include no anxiety, chest pain, headaches or palpitations. There are no associated agents to hypertension. Risk factors for coronary artery disease include obesity and male gender. Past treatments include ACE inhibitors and alpha 1 blockers. The current treatment provides moderate improvement. There are no compliance problems.  There is no history of angina or kidney disease. There is no history of chronic renal disease.     Past Medical History:  Diagnosis Date  . DVT of lower extremity, bilateral ([place]) 06/17/2012   Right CFV and popliteal vein, left C. it the only;  also interstitial fluid noted in both calfs  . Dyslipidemia   . History of echocardiogram 02/07/2011   EF 50-60%; RV mildly dilated, LA moderately dilated; mild MR; mild-mod TR; RV systolic pressure elevated;  mildly sclerotic AV  . History of nuclear stress test 02/07/2011   lexiscan; mild ischemia in mid inferolateral & apical lateral regions; low risk scan  . History of: Bacteremia due to coagulase-negative Staphylococcus 06/13/2012   During hospitalization May 30 through 06/19/2012   . History: TTP (thrombotic thrombocytopenic purpura) 06/11/2012  . Hypertension   . Obesity (BMI 30-39.9)   . OSA on CPAP   . Persistent atrial fibrillation ([place]) 02/2011   Negative Myoview; Normal EF by Echo  . Severe sepsis with acute organ dysfunction ([place]) 5/30-6/12/2012   Prolonged hospitalization for presumed sepsis and septic shock compounded by coagulopathy thought to be TTP versus Pradaxa mediated. Temporarily on hemodialysis and plasmapheresis.     Family History  Problem Relation Age of Onset  . Heart failure Mother 59  . Cancer Father        stomach     Current Outpatient Medications:  .  acetaminophen (TYLENOL) 500 MG tablet, Take 1,000 mg by mouth daily. , Disp: , Rfl:  .  diltiazem (CARDIZEM CD) 240 MG 24 hr capsule, TAKE 1 CAPSULE DAILY, Disp: 90 capsule, Rfl: 3 .  diphenhydramine-acetaminophen (TYLENOL PM) 25-500 MG TABS, Take 2 tablets by mouth at bedtime., Disp: , Rfl:  .  empagliflozin (JARDIANCE) 10 MG TABS tablet, Take 1 tablet (10 mg total) by mouth daily before breakfast., Disp: 90 tablet, Rfl: 0 .  fish oil-omega-3 fatty acids 1000 MG capsule, Take 1 g by mouth daily., Disp: , Rfl:  .  furosemide (LASIX) 20 MG tablet, TAKE 1 TABLET  TWICE A DAY, MAY TAKE AN EXTRA 20 MG (1 TABLET) AS NEEDED DAILY (PLEASE SCHEDULE AN APPOINTMENT FOR REFILLS), Disp: 270 tablet, Rfl: 3 .  hydrALAZINE (APRESOLINE) 50 MG tablet, TAKE 1 TABLET TWICE A DAY WITH FOOD, Disp: 180 tablet, Rfl: 1 .  loratadine (CLARITIN) 10 MG tablet, TAKE 1 TABLET DAILY, Disp: 90 tablet, Rfl: 3 .  metoprolol tartrate (LOPRESSOR) 100 MG tablet, TAKE 1 TABLET THREE TIMES A DAY, Disp: 270 tablet, Rfl: 3 .  Multiple Vitamin  (MULITIVITAMIN WITH MINERALS) TABS, Take 1 tablet by mouth daily., Disp: , Rfl:  .  NON FORMULARY, at bedtime. CPAP, Disp: , Rfl:  .  pravastatin (PRAVACHOL) 80 MG tablet, TAKE 1 TABLET DAILY, Disp: 90 tablet, Rfl: 3 .  psyllium (REGULOID) 0.52 g capsule, Take 5 capsules by mouth daily., Disp: , Rfl:  .  silver sulfADIAZINE (SILVADENE) 1 % cream, APPLY TOPICALLY A ONE-SIXTEENTH ( 1/16 ) INCH LAYER TO ENTIRE BURN TWICE A DAY, Disp: 200 g, Rfl: 3 .  warfarin (COUMADIN) 4 MG tablet, TAKE ONE TO ONE AND ONE-HALF TABLETS DAILY AS DIRECTED PER COUMADIN CLINIC, Disp: 135 tablet, Rfl: 3 .  blood glucose meter kit and supplies KIT, Dispense based on patient and insurance preference. Use up to four times daily as directed. (FOR ICD-9 250.00, 250.01)., Disp: 1 each, Rfl: 0 .  Dulaglutide (TRULICITY) 4.03 KV/4.2VZ SOPN, Inject 0.5 mLs (0.75 mg total) into the skin once a week., Disp: 6 mL, Rfl: 1   No Known Allergies   Review of Systems  Constitutional: Negative.   Respiratory: Negative.   Cardiovascular: Negative for chest pain, palpitations and leg swelling.  Endocrine: Negative for polydipsia, polyphagia and polyuria.  Neurological: Negative for dizziness and headaches.     Today's Vitals   09/09/19 1138  BP: 116/80  Pulse: 72  Temp: (!) 97.5 F (36.4 C)  TempSrc: Oral  Weight: (!) 310 lb 3.2 oz (140.7 kg)  Height: 5' 6"  (1.676 m)  PainSc: 0-No pain   Body mass index is 50.07 kg/m.   Objective:  Physical Exam Vitals reviewed.  Constitutional:      General: He is not in acute distress.    Appearance: Normal appearance. He is obese.     Comments: Central obesity  Cardiovascular:     Rate and Rhythm: Normal rate and regular rhythm.     Pulses: Normal pulses.     Heart sounds: Normal heart sounds. No murmur heard.   Pulmonary:     Effort: No respiratory distress.     Breath sounds: Normal breath sounds.  Musculoskeletal:        General: No swelling.  Skin:    General: Skin is  warm and dry.     Capillary Refill: Capillary refill takes less than 2 seconds.  Neurological:     General: No focal deficit present.     Mental Status: He is alert and oriented to person, place, and time.  Psychiatric:        Mood and Affect: Mood normal.        Behavior: Behavior normal.        Thought Content: Thought content normal.        Judgment: Judgment normal.         Assessment And Plan:     1. Type 2 diabetes mellitus without complication, without long-term current use of insulin (HCC)  Chronic, controlled  Continue with current medications, will add Trulicity 5.63, denies family history of medullary thyroid cancer or  personal history of pancreatitis  Discussed side effects of  - Dulaglutide (TRULICITY) 1.74 BS/4.9QP SOPN; Inject 0.5 mLs (0.75 mg total) into the skin once a week.  Dispense: 6 mL; Refill: 1  2. Essential hypertension  Chronic, well controlled  Continue with current medications  3. Dyslipidemia (high LDL; low HDL)  Chronic, continue with pravastatin  4. Persistent atrial fibrillation (HCC)  Chronic, stable  Continue with follow up with Cardiology  5. Severe obesity (BMI >= 40) (HCC)  Chronic  Discussed healthy diet and regular exercise options   Encouraged to exercise at least 150 minutes per week with 2 days of strength training as tolerated - Dulaglutide (TRULICITY) 5.91 MB/8.4YK SOPN; Inject 0.5 mLs (0.75 mg total) into the skin once a week.  Dispense: 6 mL; Refill: 1  6. Need for influenza vaccination  Influenza vaccine administered  Encouraged to take Tylenol as needed for fever or muscle aches. - Flu vaccine HIGH DOSE PF (Fluzone High dose)     Patient was given opportunity to ask questions. Patient verbalized understanding of the plan and was able to repeat key elements of the plan. All questions were answered to their satisfaction.   Teola Bradley, FNP, have reviewed all documentation for this visit. The documentation  on 09/15/19 for the exam, diagnosis, procedures, and orders are all accurate and complete.  THE PATIENT IS ENCOURAGED TO PRACTICE SOCIAL DISTANCING DUE TO THE COVID-19 PANDEMIC.

## 2019-09-09 NOTE — Patient Instructions (Signed)
Dulaglutide injection What is this medicine? DULAGLUTIDE (DOO la GLOO tide) is used to improve blood sugar control in adults with type 2 diabetes. This medicine may be used with other oral diabetes medicines. This drug may also reduce the risk of heart attack or stroke if you have type 2 diabetes and risk factors for heart disease. This medicine may be used for other purposes; ask your health care provider or pharmacist if you have questions. COMMON BRAND NAME(S): Trulicity What should I tell my health care provider before I take this medicine? They need to know if you have any of these conditions:  endocrine tumors (MEN 2) or if someone in your family had these tumors  eye disease, vision problems  history of pancreatitis  kidney disease  liver disease  stomach problems  thyroid cancer or if someone in your family had thyroid cancer  an unusual or allergic reaction to dulaglutide, other medicines, foods, dyes, or preservatives  pregnant or trying to get pregnant  breast-feeding How should I use this medicine? This medicine is for injection under the skin of your upper leg (thigh), stomach area, or upper arm. It is usually given once every week (every 7 days). You will be taught how to prepare and give this medicine. Use exactly as directed. Take your medicine at regular intervals. Do not take it more often than directed. If you use this medicine with insulin, you should inject this medicine and the insulin separately. Do not mix them together. Do not give the injections right next to each other. Change (rotate) injection sites with each injection. It is important that you put your used needles and syringes in a special sharps container. Do not put them in a trash can. If you do not have a sharps container, call your pharmacist or healthcare provider to get one. A special MedGuide will be given to you by the pharmacist with each prescription and refill. Be sure to read this information  carefully each time. This drug comes with INSTRUCTIONS FOR USE. Ask your pharmacist for directions on how to use this drug. Read the information carefully. Talk to your pharmacist or health care provider if you have questions. Talk to your pediatrician regarding the use of this medicine in children. Special care may be needed. Overdosage: If you think you have taken too much of this medicine contact a poison control center or emergency room at once. NOTE: This medicine is only for you. Do not share this medicine with others. What if I miss a dose? If you miss a dose, take it as soon as you can within 3 days after the missed dose. Then take your next dose at your regular weekly time. If it has been longer than 3 days after the missed dose, do not take the missed dose. Take the next dose at your regular time. Do not take double or extra doses. If you have questions about a missed dose, contact your health care provider for advice. What may interact with this medicine?  other medicines for diabetes Many medications may cause changes in blood sugar, these include:  alcohol containing beverages  antiviral medicines for HIV or AIDS  aspirin and aspirin-like drugs  certain medicines for blood pressure, heart disease, irregular heart beat  chromium  diuretics  male hormones, such as estrogens or progestins, birth control pills  fenofibrate  gemfibrozil  isoniazid  lanreotide  male hormones or anabolic steroids  MAOIs like Carbex, Eldepryl, Marplan, Nardil, and Parnate  medicines for weight   loss  medicines for allergies, asthma, cold, or cough  medicines for depression, anxiety, or psychotic disturbances  niacin  nicotine  NSAIDs, medicines for pain and inflammation, like ibuprofen or naproxen  octreotide  pasireotide  pentamidine  phenytoin  probenecid  quinolone antibiotics such as ciprofloxacin, levofloxacin, ofloxacin  some herbal dietary  supplements  steroid medicines such as prednisone or cortisone  sulfamethoxazole; trimethoprim  thyroid hormones Some medications can hide the warning symptoms of low blood sugar (hypoglycemia). You may need to monitor your blood sugar more closely if you are taking one of these medications. These include:  beta-blockers, often used for high blood pressure or heart problems (examples include atenolol, metoprolol, propranolol)  clonidine  guanethidine  reserpine This list may not describe all possible interactions. Give your health care provider a list of all the medicines, herbs, non-prescription drugs, or dietary supplements you use. Also tell them if you smoke, drink alcohol, or use illegal drugs. Some items may interact with your medicine. What should I watch for while using this medicine? Visit your doctor or health care professional for regular checks on your progress. Drink plenty of fluids while taking this medicine. Check with your doctor or health care professional if you get an attack of severe diarrhea, nausea, and vomiting. The loss of too much body fluid can make it dangerous for you to take this medicine. A test called the HbA1C (A1C) will be monitored. This is a simple blood test. It measures your blood sugar control over the last 2 to 3 months. You will receive this test every 3 to 6 months. Learn how to check your blood sugar. Learn the symptoms of low and high blood sugar and how to manage them. Always carry a quick-source of sugar with you in case you have symptoms of low blood sugar. Examples include hard sugar candy or glucose tablets. Make sure others know that you can choke if you eat or drink when you develop serious symptoms of low blood sugar, such as seizures or unconsciousness. They must get medical help at once. Tell your doctor or health care professional if you have high blood sugar. You might need to change the dose of your medicine. If you are sick or  exercising more than usual, you might need to change the dose of your medicine. Do not skip meals. Ask your doctor or health care professional if you should avoid alcohol. Many nonprescription cough and cold products contain sugar or alcohol. These can affect blood sugar. Pens should never be shared. Even if the needle is changed, sharing may result in passing of viruses like hepatitis or HIV. Wear a medical ID bracelet or chain, and carry a card that describes your disease and details of your medicine and dosage times. What side effects may I notice from receiving this medicine? Side effects that you should report to your doctor or health care professional as soon as possible:  allergic reactions like skin rash, itching or hives, swelling of the face, lips, or tongue  breathing problems  changes in vision  diarrhea that continues or is severe  lump or swelling on the neck  severe nausea  signs and symptoms of infection like fever or chills; cough; sore throat; pain or trouble passing urine  signs and symptoms of low blood sugar such as feeling anxious, confusion, dizziness, increased hunger, unusually weak or tired, sweating, shakiness, cold, irritable, headache, blurred vision, fast heartbeat, loss of consciousness  signs and symptoms of kidney injury like trouble passing   urine or change in the amount of urine  trouble swallowing  unusual stomach upset or pain  vomiting Side effects that usually do not require medical attention (report to your doctor or health care professional if they continue or are bothersome):  diarrhea  loss of appetite  nausea  pain, redness, or irritation at site where injected  stomach upset This list may not describe all possible side effects. Call your doctor for medical advice about side effects. You may report side effects to FDA at 1-800-FDA-1088. Where should I keep my medicine? Keep out of the reach of children. Store unopened pens in a  refrigerator between 2 and 8 degrees C (36 and 46 degrees F). Do not freeze or use if the medicine has been frozen. Protect from light and excessive heat. Store in the carton until use. Each single-dose pen can be kept at room temperature, not to exceed 30 degrees C (86 degrees F) for a total of 14 days, if needed. Throw away any unused medicine after the expiration date on the label. NOTE: This sheet is a summary. It may not cover all possible information. If you have questions about this medicine, talk to your doctor, pharmacist, or health care provider.  2020 Elsevier/Gold Standard (2018-09-09 09:34:53)  

## 2019-09-10 LAB — CMP14+EGFR
ALT: 20 IU/L (ref 0–44)
AST: 26 IU/L (ref 0–40)
Albumin/Globulin Ratio: 1.3 (ref 1.2–2.2)
Albumin: 4.5 g/dL (ref 3.7–4.7)
Alkaline Phosphatase: 89 IU/L (ref 48–121)
BUN/Creatinine Ratio: 20 (ref 10–24)
BUN: 24 mg/dL (ref 8–27)
Bilirubin Total: 0.3 mg/dL (ref 0.0–1.2)
CO2: 24 mmol/L (ref 20–29)
Calcium: 9.4 mg/dL (ref 8.6–10.2)
Chloride: 99 mmol/L (ref 96–106)
Creatinine, Ser: 1.21 mg/dL (ref 0.76–1.27)
GFR calc Af Amer: 68 mL/min/{1.73_m2} (ref >59)
GFR calc non Af Amer: 59 mL/min/{1.73_m2} — ABNORMAL LOW (ref >59)
Globulin, Total: 3.6 g/dL (ref 1.5–4.5)
Glucose: 168 mg/dL — ABNORMAL HIGH (ref 65–99)
Potassium: 3.9 mmol/L (ref 3.5–5.2)
Sodium: 140 mmol/L (ref 134–144)
Total Protein: 8.1 g/dL (ref 6.0–8.5)

## 2019-09-10 LAB — LIPID PANEL
Chol/HDL Ratio: 4.1 ratio (ref 0.0–5.0)
Cholesterol, Total: 111 mg/dL (ref 100–199)
HDL: 27 mg/dL — ABNORMAL LOW (ref >39)
LDL Chol Calc (NIH): 57 mg/dL (ref 0–99)
Triglycerides: 158 mg/dL — ABNORMAL HIGH (ref 0–149)
VLDL Cholesterol Cal: 27 mg/dL (ref 5–40)

## 2019-09-10 LAB — HEMOGLOBIN A1C
Est. average glucose Bld gHb Est-mCnc: 143 mg/dL
Hgb A1c MFr Bld: 6.6 % — ABNORMAL HIGH (ref 4.8–5.6)

## 2019-09-22 ENCOUNTER — Other Ambulatory Visit (INDEPENDENT_AMBULATORY_CARE_PROVIDER_SITE_OTHER): Payer: Medicare Other

## 2019-09-22 DIAGNOSIS — Z23 Encounter for immunization: Secondary | ICD-10-CM

## 2019-09-30 ENCOUNTER — Other Ambulatory Visit: Payer: Self-pay

## 2019-09-30 ENCOUNTER — Ambulatory Visit (INDEPENDENT_AMBULATORY_CARE_PROVIDER_SITE_OTHER): Payer: Medicare Other

## 2019-09-30 DIAGNOSIS — I4819 Other persistent atrial fibrillation: Secondary | ICD-10-CM

## 2019-09-30 DIAGNOSIS — Z7901 Long term (current) use of anticoagulants: Secondary | ICD-10-CM | POA: Diagnosis not present

## 2019-09-30 DIAGNOSIS — Z5181 Encounter for therapeutic drug level monitoring: Secondary | ICD-10-CM

## 2019-09-30 LAB — POCT INR: INR: 1.8 — AB (ref 2.0–3.0)

## 2019-09-30 NOTE — Patient Instructions (Signed)
Take 2 tablets tonight and then Continue to 1.5 tablets daily except 1 tablet on Sundays and Wednesdays.  Repeat INR in 6 weeks.

## 2019-10-07 ENCOUNTER — Other Ambulatory Visit: Payer: Self-pay

## 2019-10-07 MED ORDER — FREESTYLE LIBRE 2 READER DEVI: 3 refills | Status: DC

## 2019-10-07 MED ORDER — FREESTYLE LIBRE 2 SENSOR MISC: 3 refills | Status: DC

## 2019-10-26 ENCOUNTER — Other Ambulatory Visit: Payer: Self-pay | Admitting: Nurse Practitioner

## 2019-11-11 ENCOUNTER — Other Ambulatory Visit: Payer: Self-pay

## 2019-11-11 ENCOUNTER — Ambulatory Visit (INDEPENDENT_AMBULATORY_CARE_PROVIDER_SITE_OTHER): Payer: Medicare Other

## 2019-11-11 DIAGNOSIS — Z7901 Long term (current) use of anticoagulants: Secondary | ICD-10-CM | POA: Diagnosis not present

## 2019-11-11 DIAGNOSIS — Z5181 Encounter for therapeutic drug level monitoring: Secondary | ICD-10-CM

## 2019-11-11 DIAGNOSIS — I4819 Other persistent atrial fibrillation: Secondary | ICD-10-CM

## 2019-11-11 LAB — POCT INR: INR: 2.2 (ref 2.0–3.0)

## 2019-11-11 MED ORDER — FREESTYLE LITE DEVI: 3 refills | Status: AC

## 2019-11-11 NOTE — Patient Instructions (Signed)
Continue to 1.5 tablets daily except 1 tablet on Sundays and Wednesdays.  Repeat INR in 6 weeks.

## 2019-12-08 ENCOUNTER — Ambulatory Visit: Payer: Medicare Other | Admitting: Nurse Practitioner

## 2019-12-09 ENCOUNTER — Ambulatory Visit: Payer: Medicare Other | Admitting: Nurse Practitioner

## 2019-12-23 ENCOUNTER — Other Ambulatory Visit: Payer: Self-pay

## 2019-12-23 ENCOUNTER — Ambulatory Visit (INDEPENDENT_AMBULATORY_CARE_PROVIDER_SITE_OTHER): Payer: Medicare Other

## 2019-12-23 DIAGNOSIS — Z5181 Encounter for therapeutic drug level monitoring: Secondary | ICD-10-CM

## 2019-12-23 DIAGNOSIS — I4819 Other persistent atrial fibrillation: Secondary | ICD-10-CM | POA: Diagnosis not present

## 2019-12-23 DIAGNOSIS — Z7901 Long term (current) use of anticoagulants: Secondary | ICD-10-CM

## 2019-12-23 LAB — POCT INR: INR: 2.4 (ref 2.0–3.0)

## 2019-12-23 NOTE — Patient Instructions (Signed)
Continue to 1.5 tablets daily except 1 tablet on Sundays and Wednesdays.  Repeat INR in 6 weeks.

## 2020-01-07 ENCOUNTER — Other Ambulatory Visit: Payer: Self-pay | Admitting: Nurse Practitioner

## 2020-01-13 ENCOUNTER — Other Ambulatory Visit: Payer: Self-pay

## 2020-01-13 ENCOUNTER — Encounter: Payer: Self-pay | Admitting: Nurse Practitioner

## 2020-01-13 ENCOUNTER — Ambulatory Visit (INDEPENDENT_AMBULATORY_CARE_PROVIDER_SITE_OTHER): Payer: Medicare Other

## 2020-01-13 ENCOUNTER — Ambulatory Visit (INDEPENDENT_AMBULATORY_CARE_PROVIDER_SITE_OTHER): Payer: Medicare Other | Admitting: Nurse Practitioner

## 2020-01-13 VITALS — BP 138/76 | HR 66 | Temp 98.2°F | Ht 65.8 in | Wt 303.8 lb

## 2020-01-13 DIAGNOSIS — E119 Type 2 diabetes mellitus without complications: Secondary | ICD-10-CM

## 2020-01-13 DIAGNOSIS — I10 Essential (primary) hypertension: Secondary | ICD-10-CM | POA: Diagnosis not present

## 2020-01-13 DIAGNOSIS — Z0000 Encounter for general adult medical examination without abnormal findings: Secondary | ICD-10-CM | POA: Diagnosis not present

## 2020-01-13 DIAGNOSIS — E785 Hyperlipidemia, unspecified: Secondary | ICD-10-CM

## 2020-01-13 DIAGNOSIS — I4819 Other persistent atrial fibrillation: Secondary | ICD-10-CM

## 2020-01-13 DIAGNOSIS — Z6841 Body Mass Index (BMI) 40.0 and over, adult: Secondary | ICD-10-CM

## 2020-01-13 DIAGNOSIS — G4733 Obstructive sleep apnea (adult) (pediatric): Secondary | ICD-10-CM

## 2020-01-13 DIAGNOSIS — Z9989 Dependence on other enabling machines and devices: Secondary | ICD-10-CM

## 2020-01-13 LAB — POCT URINALYSIS DIPSTICK
Bilirubin, UA: NEGATIVE
Blood, UA: NEGATIVE
Glucose, UA: NEGATIVE
Ketones, UA: NEGATIVE
Leukocytes, UA: NEGATIVE
Nitrite, UA: NEGATIVE
Protein, UA: NEGATIVE
Spec Grav, UA: 1.02 (ref 1.010–1.025)
Urobilinogen, UA: 0.2 E.U./dL
pH, UA: 6.5 (ref 5.0–8.0)

## 2020-01-13 LAB — POCT UA - MICROALBUMIN
Creatinine, POC: 100 mg/dL
Microalbumin Ur, POC: 80 mg/L

## 2020-01-13 MED ORDER — TRULICITY 0.75 MG/0.5ML ~~LOC~~ SOAJ: 0.7500 mg | SUBCUTANEOUS | 1 refills | Status: DC

## 2020-01-13 MED ORDER — EMPAGLIFLOZIN 10 MG PO TABS: 10.0000 mg | ORAL_TABLET | Freq: Every day | ORAL | 1 refills | Status: DC

## 2020-01-13 NOTE — Progress Notes (Signed)
I,Yamilka Roman [employer] as a Education administrator for [employer], FNP.,have documented all relevant documentation on the behalf of Brett Brine, FNP,as directed by  Brett Brine, FNP while in the presence of Brett [name], [place]. This visit occurred during the SARS-CoV-2 public health emergency.  Safety protocols were in place, including screening questions prior to the visit, additional usage of staff PPE, and extensive cleaning of exam room while observing appropriate contact time as indicated for disinfecting solutions.  Subjective:     Patient ID: Brett [name] , male    DOB: Aug 05, 1945 , 75 y.o.   MRN: [id number]   Chief Complaint  Patient presents with  . Diabetes  . Hypertension    HPI  Patient presents today for a f/u on her diabetes and blood pressure.  Reports when he gets up in the morning his legs are small.   Wt Readings from Last 3 Encounters: 01/13/20 : (!) 303 lb 12.7 oz (137.8 kg) 01/13/20 : (!) 303 lb 12.8 oz (137.8 kg) 09/09/19 : (!) 310 lb 3.2 oz (140.7 kg)  Diabetes He presents for his follow-up diabetic visit. He has type 2 diabetes mellitus. His disease course has been stable. Pertinent negatives for hypoglycemia include no headaches. Pertinent negatives for diabetes include no chest pain. There are no hypoglycemic complications. There are no diabetic complications. Risk factors for coronary artery disease include sedentary lifestyle, obesity, male sex, hypertension and diabetes mellitus. Current diabetic treatment includes oral agent (dual therapy). He is compliant with treatment all of the time. There is no change in his home blood glucose trend. (He has not been checking regularly due to not understanding where his numbers should be)  Hypertension This is a chronic problem. The current episode started more than 1 year ago. The problem has been gradually worsening since onset. The problem is uncontrolled. Pertinent negatives include no anxiety, chest pain, headaches or  palpitations. There are no associated agents to hypertension. Risk factors for coronary artery disease include obesity and male gender. Past treatments include ACE inhibitors and alpha 1 blockers. The current treatment provides moderate improvement. There are no compliance problems.  There is no history of angina or kidney disease. There is no history of chronic renal disease.     Past Medical History:  Diagnosis Date  . DVT of lower extremity, bilateral ([place]) 06/17/2012   Right CFV and popliteal vein, left C. it the only;  also interstitial fluid noted in both calfs  . Dyslipidemia   . History of echocardiogram 02/07/2011   EF 50-60%; RV mildly dilated, LA moderately dilated; mild MR; mild-mod TR; RV systolic pressure elevated; mildly sclerotic AV  . History of nuclear stress test 02/07/2011   lexiscan; mild ischemia in mid inferolateral & apical lateral regions; low risk scan  . History of: Bacteremia due to coagulase-negative Staphylococcus 06/13/2012   During hospitalization May 30 through 06/19/2012   . History: TTP (thrombotic thrombocytopenic purpura) 06/11/2012  . Hypertension   . Obesity (BMI 30-39.9)   . OSA on CPAP   . Persistent atrial fibrillation ([place]) 02/2011   Negative Myoview; Normal EF by Echo  . Severe sepsis with acute organ dysfunction ([place]) 5/30-6/12/2012   Prolonged hospitalization for presumed sepsis and septic shock compounded by coagulopathy thought to be TTP versus Pradaxa mediated. Temporarily on hemodialysis and plasmapheresis.     Family History  Problem Relation Age of Onset  . Heart failure Mother 48  . Cancer Father        stomach  Current Outpatient Medications:  .  acetaminophen (TYLENOL) 500 MG tablet, Take 1,000 mg by mouth daily. , Disp: , Rfl:  .  blood glucose meter kit and supplies KIT, Dispense based on patient and insurance preference. Use up to four times daily as directed. (FOR ICD-9 250.00, 250.01)., Disp: 1 each, Rfl: 0 .  Blood Glucose  Monitoring Suppl (FREESTYLE LITE) DEVI, Use to test blood sugar 3 times a day. Dx code e11.65, Disp: 1 each, Rfl: 3 .  Continuous Blood Gluc Receiver (FREESTYLE LIBRE 2 READER) DEVI, Use to check blood sugar 4 times a day. Dx code e11.65, Disp: 3 each, Rfl: 3 .  Continuous Blood Gluc Sensor (FREESTYLE LIBRE 2 SENSOR) MISC, Use to check blood sugar 4 times a day. Dx code e11.65, Disp: 3 each, Rfl: 3 .  diltiazem (CARDIZEM CD) 240 MG 24 hr capsule, TAKE 1 CAPSULE DAILY, Disp: 90 capsule, Rfl: 3 .  diphenhydramine-acetaminophen (TYLENOL PM) 25-500 MG TABS, Take 2 tablets by mouth at bedtime., Disp: , Rfl:  .  Dulaglutide (TRULICITY) 8.84 ZY/6.0YT SOPN, Inject 0.75 mg into the skin once a week., Disp: 6 mL, Rfl: 1 .  empagliflozin (JARDIANCE) 10 MG TABS tablet, Take 1 tablet (10 mg total) by mouth daily before breakfast., Disp: 90 tablet, Rfl: 1 .  fish oil-omega-3 fatty acids 1000 MG capsule, Take 1 g by mouth daily., Disp: , Rfl:  .  furosemide (LASIX) 20 MG tablet, TAKE 1 TABLET TWICE A DAY, MAY TAKE AN EXTRA 20 MG (1 TABLET) AS NEEDED DAILY (PLEASE SCHEDULE AN APPOINTMENT FOR REFILLS), Disp: 270 tablet, Rfl: 3 .  hydrALAZINE (APRESOLINE) 50 MG tablet, TAKE 1 TABLET TWICE A DAY WITH FOOD, Disp: 180 tablet, Rfl: 3 .  loratadine (CLARITIN) 10 MG tablet, TAKE 1 TABLET DAILY, Disp: 90 tablet, Rfl: 3 .  metoprolol tartrate (LOPRESSOR) 100 MG tablet, TAKE 1 TABLET THREE TIMES A DAY, Disp: 270 tablet, Rfl: 3 .  Multiple Vitamin (MULITIVITAMIN WITH MINERALS) TABS, Take 1 tablet by mouth daily., Disp: , Rfl:  .  NON FORMULARY, at bedtime. CPAP, Disp: , Rfl:  .  pravastatin (PRAVACHOL) 80 MG tablet, TAKE 1 TABLET DAILY, Disp: 90 tablet, Rfl: 3 .  psyllium (REGULOID) 0.52 g capsule, Take 5 capsules by mouth daily., Disp: , Rfl:  .  silver sulfADIAZINE (SILVADENE) 1 % cream, APPLY TOPICALLY A ONE-SIXTEENTH ( 1/16 ) INCH LAYER TO ENTIRE BURN TWICE A DAY, Disp: 200 g, Rfl: 3 .  warfarin (COUMADIN) 4 MG tablet,  TAKE ONE TO ONE AND ONE-HALF TABLETS DAILY AS DIRECTED PER COUMADIN CLINIC, Disp: 135 tablet, Rfl: 3   No Known Allergies   Review of Systems  Constitutional: Negative.   HENT: Negative.   Eyes: Negative.   Respiratory: Negative.   Cardiovascular: Negative.  Negative for chest pain and palpitations.  Gastrointestinal: Negative.   Endocrine: Negative.   Genitourinary: Negative.   Musculoskeletal: Negative.   Skin: Negative.   Neurological: Negative.  Negative for headaches.  Hematological: Negative.   Psychiatric/Behavioral: Negative.      Today's Vitals   01/13/20 0848  BP: 138/76  Pulse: 66  Temp: 98.2 F (36.8 C)  TempSrc: Oral  Weight: (!) 303 lb 12.7 oz (137.8 kg)  Height: 5' 5.8" (1.671 m)   Body mass index is 49.33 kg/m.   Objective:  Physical Exam Vitals reviewed.  Constitutional:      General: He is not in acute distress.    Appearance: Normal appearance. He is obese.  Comments: Central obesity  Cardiovascular:     Rate and Rhythm: Normal rate and regular rhythm.     Pulses: Normal pulses.     Heart sounds: Normal heart sounds. No murmur heard.   Pulmonary:     Effort: No respiratory distress.     Breath sounds: Normal breath sounds. No wheezing.  Musculoskeletal:        General: No swelling.     Right lower leg: Edema (trace edema) present.     Left lower leg: Edema (more firm and 1+ edema) present.     Comments: Using a cane to ambulate  Skin:    General: Skin is warm and dry.     Capillary Refill: Capillary refill takes less than 2 seconds.  Neurological:     General: No focal deficit present.     Mental Status: He is alert and oriented to person, place, and time.  Psychiatric:        Mood and Affect: Mood normal.        Behavior: Behavior normal.        Thought Content: Thought content normal.        Judgment: Judgment normal.         Assessment And Plan:     1. Type 2 diabetes mellitus without complication, without long-term  current use of insulin (HCC)  Chronic, slightly improved.   Continue with current medications, refills sent  Encouraged to limit intake of sugary foods and drinks  Encouraged to increase physical activity to 150 minutes per week he has had a 7 lb weight loss since his last visit  Will have him speak with a diabetic educator - Dulaglutide (TRULICITY) 9.62 EZ/6.6QH SOPN; Inject 0.75 mg into the skin once a week.  Dispense: 6 mL; Refill: 1 - CMP14+EGFR - Hemoglobin A1c  2. Essential hypertension . B/P is controlled.  . CMP ordered to check renal function.  . The importance of regular exercise and dietary modification was stressed to the patient.  - CMP14+EGFR  3. Hyperlipidemia, unspecified hyperlipidemia type  Chronic, stable. - Lipid panel  4. Persistent atrial fibrillation (HCC)  Chronic, continue with follow up with Cardiology   Continue with warfarin  5. OSA (obstructive sleep apnea)  Chronic, continue use of CPAP  Feels is beneficial for better quality of life  6. Severe obesity (BMI >= 40) (HCC)  Chronic, he has lost 7 lbs since his last office visit  Hopefully with the diabetic education this will also help him with losing weight - Dulaglutide (TRULICITY) 4.76 LY/6.5KP SOPN; Inject 0.75 mg into the skin once a week.  Dispense: 6 mL; Refill: 1     Patient was given opportunity to ask questions. Patient verbalized understanding of the plan and was able to repeat key elements of the plan. All questions were answered to their satisfaction.  Brett Brine, FNP   I, Brett Brine, FNP, have reviewed all documentation for this visit. The documentation on 01/13/20 for the exam, diagnosis, procedures, and orders are all accurate and complete.   THE PATIENT IS ENCOURAGED TO PRACTICE SOCIAL DISTANCING DUE TO THE COVID-19 PANDEMIC.

## 2020-01-13 NOTE — Addendum Note (Signed)
Addended by: Elisha Ponder E on: 01/13/2020 11:20 AM   Modules accepted: Orders

## 2020-01-13 NOTE — Patient Instructions (Signed)
Brett [name] , Thank you for taking time to come for your Medicare Wellness Visit. I appreciate your ongoing commitment to your health goals. Please review the following plan we discussed and let me know if I can assist you in the future.   Screening recommendations/referrals: Colonoscopy: completed 06/30/2010, due 06/29/2020 Recommended yearly ophthalmology/optometry visit for glaucoma screening and checkup Recommended yearly dental visit for hygiene and checkup  Vaccinations: Influenza vaccine: completed 09/09/2019, due 08/08/2020 Pneumococcal vaccine: decline Tdap vaccine: completed 09/15/2012, due 09/16/2022 Shingles vaccine: discussed   Covid-19:  03/24/2019, 02/22/2019  Advanced directives: Please bring a copy of your POA (Power of Attorney) and/or Living Will to your next appointment.   Conditions/risks identified: none  Next appointment: Follow up in one year for your annual wellness visit.   Preventive Care 76 Years and Older, Male Preventive care refers to lifestyle choices and visits with your health care provider that can promote health and wellness. What does preventive care include?  A yearly physical exam. This is also called an annual well check.  Dental exams once or twice a year.  Routine eye exams. Ask your health care provider how often you should have your eyes checked.  Personal lifestyle choices, including:  Daily care of your teeth and gums.  Regular physical activity.  Eating a healthy diet.  Avoiding tobacco and drug use.  Limiting alcohol use.  Practicing safe sex.  Taking low doses of aspirin every day.  Taking vitamin and mineral supplements as recommended by your health care provider. What happens during an annual well check? The services and screenings done by your health care provider during your annual well check will depend on your age, overall health, lifestyle risk factors, and family history of disease. Counseling  Your health care provider  may ask you questions about your:  Alcohol use.  Tobacco use.  Drug use.  Emotional well-being.  Home and relationship well-being.  Sexual activity.  Eating habits.  History of falls.  Memory and ability to understand (cognition).  Work and work Astronomer. Screening  You may have the following tests or measurements:  Height, weight, and BMI.  Blood pressure.  Lipid and cholesterol levels. These may be checked every 5 years, or more frequently if you are over 76 years old.  Skin check.  Lung cancer screening. You may have this screening every year starting at age 33 if you have a 30-pack-year history of smoking and currently smoke or have quit within the past 15 years.  Fecal occult blood test (FOBT) of the stool. You may have this test every year starting at age 46.  Flexible sigmoidoscopy or colonoscopy. You may have a sigmoidoscopy every 5 years or a colonoscopy every 10 years starting at age 35.  Prostate cancer screening. Recommendations will vary depending on your family history and other risks.  Hepatitis C blood test.  Hepatitis B blood test.  Sexually transmitted disease (STD) testing.  Diabetes screening. This is done by checking your blood sugar (glucose) after you have not eaten for a while (fasting). You may have this done every 1-3 years.  Abdominal aortic aneurysm (AAA) screening. You may need this if you are a current or former smoker.  Osteoporosis. You may be screened starting at age 18 if you are at high risk. Talk with your health care provider about your test results, treatment options, and if necessary, the need for more tests. Vaccines  Your health care provider may recommend certain vaccines, such as:  Influenza vaccine. This  is recommended every year.  Tetanus, diphtheria, and acellular pertussis (Tdap, Td) vaccine. You may need a Td booster every 10 years.  Zoster vaccine. You may need this after age 87.  Pneumococcal 13-valent  conjugate (PCV13) vaccine. One dose is recommended after age 43.  Pneumococcal polysaccharide (PPSV23) vaccine. One dose is recommended after age 80. Talk to your health care provider about which screenings and vaccines you need and how often you need them. This information is not intended to replace advice given to you by your health care provider. Make sure you discuss any questions you have with your health care provider. Document Released: 01/21/2015 Document Revised: 09/14/2015 Document Reviewed: 10/26/2014 Elsevier Interactive Patient Education  2017 [place] Prevention in the Home Falls can cause injuries. They can happen to people of all ages. There are many things you can do to make your home safe and to help prevent falls. What can I do on the outside of my home?  Regularly fix the edges of walkways and driveways and fix any cracks.  Remove anything that might make you trip as you walk through a door, such as a raised step or threshold.  Trim any bushes or trees on the path to your home.  Use bright outdoor lighting.  Clear any walking paths of anything that might make someone trip, such as rocks or tools.  Regularly check to see if handrails are loose or broken. Make sure that both sides of any steps have handrails.  Any raised decks and porches should have guardrails on the edges.  Have any leaves, snow, or ice cleared regularly.  Use sand or salt on walking paths during winter.  Clean up any spills in your garage right away. This includes oil or grease spills. What can I do in the bathroom?  Use night lights.  Install grab bars by the toilet and in the tub and shower. Do not use towel bars as grab bars.  Use non-skid mats or decals in the tub or shower.  If you need to sit down in the shower, use a plastic, non-slip stool.  Keep the floor dry. Clean up any water that spills on the floor as soon as it happens.  Remove soap buildup in the tub or  shower regularly.  Attach bath mats securely with double-sided non-slip rug tape.  Do not have throw rugs and other things on the floor that can make you trip. What can I do in the bedroom?  Use night lights.  Make sure that you have a light by your bed that is easy to reach.  Do not use any sheets or blankets that are too big for your bed. They should not hang down onto the floor.  Have a firm chair that has side arms. You can use this for support while you get dressed.  Do not have throw rugs and other things on the floor that can make you trip. What can I do in the kitchen?  Clean up any spills right away.  Avoid walking on wet floors.  Keep items that you use a lot in easy-to-reach places.  If you need to reach something above you, use a strong step stool that has a grab bar.  Keep electrical cords out of the way.  Do not use floor polish or wax that makes floors slippery. If you must use wax, use non-skid floor wax.  Do not have throw rugs and other things on the floor that can make you  trip. What can I do with my stairs?  Do not leave any items on the stairs.  Make sure that there are handrails on both sides of the stairs and use them. Fix handrails that are broken or loose. Make sure that handrails are as long as the stairways.  Check any carpeting to make sure that it is firmly attached to the stairs. Fix any carpet that is loose or worn.  Avoid having throw rugs at the top or bottom of the stairs. If you do have throw rugs, attach them to the floor with carpet tape.  Make sure that you have a light switch at the top of the stairs and the bottom of the stairs. If you do not have them, ask someone to add them for you. What else can I do to help prevent falls?  Wear shoes that:  Do not have high heels.  Have rubber bottoms.  Are comfortable and fit you well.  Are closed at the toe. Do not wear sandals.  If you use a stepladder:  Make sure that it is fully  opened. Do not climb a closed stepladder.  Make sure that both sides of the stepladder are locked into place.  Ask someone to hold it for you, if possible.  Clearly mark and make sure that you can see:  Any grab bars or handrails.  First and last steps.  Where the edge of each step is.  Use tools that help you move around (mobility aids) if they are needed. These include:  Canes.  Walkers.  Scooters.  Crutches.  Turn on the lights when you go into a dark area. Replace any light bulbs as soon as they burn out.  Set up your furniture so you have a clear path. Avoid moving your furniture around.  If any of your floors are uneven, fix them.  If there are any pets around you, be aware of where they are.  Review your medicines with your doctor. Some medicines can make you feel dizzy. This can increase your chance of falling. Ask your doctor what other things that you can do to help prevent falls. This information is not intended to replace advice given to you by your health care provider. Make sure you discuss any questions you have with your health care provider. Document Released: 10/21/2008 Document Revised: 06/02/2015 Document Reviewed: 01/29/2014 Elsevier Interactive Patient Education  2017 [employer].

## 2020-01-13 NOTE — Patient Instructions (Addendum)
Hypertension, Adult Hypertension is another name for high blood pressure. High blood pressure forces your heart to work harder to pump blood. This can cause problems over time. There are two numbers in a blood pressure reading. There is a top number (systolic) over a bottom number (diastolic). It is best to have a blood pressure that is below 120/80. Healthy choices can help lower your blood pressure, or you may need medicine to help lower it. What are the causes? The cause of this condition is not known. Some conditions may be related to high blood pressure. What increases the risk?  Smoking.  Having type 2 diabetes mellitus, high cholesterol, or both.  Not getting enough exercise or physical activity.  Being overweight.  Having too much fat, sugar, calories, or salt (sodium) in your diet.  Drinking too much alcohol.  Having long-term (chronic) kidney disease.  Having a family history of high blood pressure.  Age. Risk increases with age.  Race. You may be at higher risk if you are African American.  Gender. Men are at higher risk than women before age 45. After age 65, women are at higher risk than men.  Having obstructive sleep apnea.  Stress. What are the signs or symptoms?  High blood pressure may not cause symptoms. Very high blood pressure (hypertensive crisis) may cause: ? Headache. ? Feelings of worry or nervousness (anxiety). ? Shortness of breath. ? Nosebleed. ? A feeling of being sick to your stomach (nausea). ? Throwing up (vomiting). ? Changes in how you see. ? Very bad chest pain. ? Seizures. How is this treated?  This condition is treated by making healthy lifestyle changes, such as: ? Eating healthy foods. ? Exercising more. ? Drinking less alcohol.  Your health care provider may prescribe medicine if lifestyle changes are not enough to get your blood pressure under control, and if: ? Your top number is above 130. ? Your bottom number is above  80.  Your personal target blood pressure may vary. Follow these instructions at home: Eating and drinking   If told, follow the DASH eating plan. To follow this plan: ? Fill one half of your plate at each meal with fruits and vegetables. ? Fill one fourth of your plate at each meal with whole grains. Whole grains include whole-wheat pasta, brown rice, and whole-grain bread. ? Eat or drink low-fat dairy products, such as skim milk or low-fat yogurt. ? Fill one fourth of your plate at each meal with low-fat (lean) proteins. Low-fat proteins include fish, chicken without skin, eggs, beans, and tofu. ? Avoid fatty meat, cured and processed meat, or chicken with skin. ? Avoid pre-made or processed food.  Eat less than 1,500 mg of salt each day.  Do not drink alcohol if: ? Your doctor tells you not to drink. ? You are pregnant, may be pregnant, or are planning to become pregnant.  If you drink alcohol: ? Limit how much you use to:  0-1 drink a day for women.  0-2 drinks a day for men. ? Be aware of how much alcohol is in your drink. In the [place] one drink equals one 12 oz bottle of beer (355 mL), one 5 oz glass of wine (148 mL), or one 1 oz glass of hard liquor (44 mL). Lifestyle   Work with your doctor to stay at a healthy weight or to lose weight. Ask your doctor what the best weight is for you.  Get at least 30 minutes of exercise most   days of the week. This may include walking, swimming, or biking.  Get at least 30 minutes of exercise that strengthens your muscles (resistance exercise) at least 3 days a week. This may include lifting weights or doing Pilates.  Do not use any products that contain nicotine or tobacco, such as cigarettes, e-cigarettes, and chewing tobacco. If you need help quitting, ask your doctor.  Check your blood pressure at home as told by your doctor.  Keep all follow-up visits as told by your doctor. This is important. Medicines  Take over-the-counter  and prescription medicines only as told by your doctor. Follow directions carefully.  Do not skip doses of blood pressure medicine. The medicine does not work as well if you skip doses. Skipping doses also puts you at risk for problems.  Ask your doctor about side effects or reactions to medicines that you should watch for. Contact a doctor if you:  Think you are having a reaction to the medicine you are taking.  Have headaches that keep coming back (recurring).  Feel dizzy.  Have swelling in your ankles.  Have trouble with your vision. Get help right away if you:  Get a very bad headache.  Start to feel mixed up (confused).  Feel weak or numb.  Feel faint.  Have very bad pain in your: ? Chest. ? Belly (abdomen).  Throw up more than once.  Have trouble breathing. Summary  Hypertension is another name for high blood pressure.  High blood pressure forces your heart to work harder to pump blood.  For most people, a normal blood pressure is less than 120/80.  Making healthy choices can help lower blood pressure. If your blood pressure does not get lower with healthy choices, you may need to take medicine. This information is not intended to replace advice given to you by your health care provider. Make sure you discuss any questions you have with your health care provider. Document Revised: 09/04/2017 Document Reviewed: 09/04/2017 Elsevier Patient Education  2020 [employer] Inc[employer]   Type 2 Diabetes Mellitus, Diagnosis, Adult Type 2 diabetes (type 2 diabetes mellitus) is a long-term (chronic) disease. It may be caused by one or both of these problems:  Your pancreas does not make enough of a hormone called insulin.  Your body does not react in a normal way to insulin that it makes. Insulin lets sugars (glucose) go into cells in your body. This gives you energy. If you have type 2 diabetes, sugars cannot get into cells. This causes high blood sugar (hyperglycemia). Your  doctor will set treatment goals for you. Generally, you should have these blood sugar levels:  Before meals (preprandial): 80-130 mg/dL (5.6-3.8 mmol/L).  After meals (postprandial): below 180 mg/dL (10 mmol/L).  A1c (hemoglobin A1c) level: less than 7%. Follow these instructions at home: Questions to ask your doctor  You may want to ask these questions: ? Do I need to meet with a diabetes educator? ? Where can I find a support group for people with diabetes? ? What equipment will I need to care for myself at home? ? What diabetes medicines do I need? When should I take them? ? How often do I need to check my blood sugar? ? What number can I call if I have questions? ? When is my next doctor's visit? General instructions  Take over-the-counter and prescription medicines only as told by your doctor.  Keep all follow-up visits as told by your doctor. This is important. Contact a doctor if:  Your blood sugar is at or above 240 mg/dL (13.3 mmol/L) for 2 days in a row.  You have been sick for 2 days or more, and you are not getting better.  You have had a fever for 2 days or more, and you are not getting better.  You have any of these problems for more than 6 hours: ? You cannot eat or drink. ? You feel sick to your stomach (nauseous). ? You throw up (vomit). ? You have watery poop (diarrhea). Get help right away if:  Your blood sugar is lower than 54 mg/dL (3 mmol/L).  You get confused.  You have trouble: ? Thinking clearly. ? Breathing.  You have moderate or large ketone levels in your pee (urine). Summary  Type 2 diabetes is a long-term (chronic) disease. Your pancreas may not make enough of a hormone called insulin, or your body may not react normally to insulin that it makes.  Take over-the-counter and prescription medicines only as told by your doctor.  Keep all follow-up visits as told by your doctor. This is important. This information is not intended to  replace advice given to you by your health care provider. Make sure you discuss any questions you have with your health care provider. Document Revised: 02/22/2017 Document Reviewed: 01/28/2015 Elsevier Patient Education  2020 [employer].

## 2020-01-13 NOTE — Progress Notes (Signed)
This visit occurred during the SARS-CoV-2 public health emergency.  Safety protocols were in place, including screening questions prior to the visit, additional usage of staff PPE, and extensive cleaning of exam room while observing appropriate contact time as indicated for disinfecting solutions.  Subjective:   Brett [name] is a 75 y.o. male who presents for Medicare Annual/Subsequent preventive examination.  Review of Systems     Cardiac Risk Factors include: advanced age (>34mn, >>37women);diabetes mellitus;dyslipidemia;hypertension;male gender;obesity (BMI >30kg/m2);sedentary lifestyle     Objective:    Today's Vitals   01/13/20 0830  BP: 138/76  Pulse: 66  Temp: 98.2 F (36.8 C)  TempSrc: Oral  SpO2: 96%  Weight: (!) 303 lb 12.8 oz (137.8 kg)  Height: 5' 5.8" (1.671 m)   Body mass index is 49.33 kg/m.  Advanced Directives 01/13/2020 11/27/2018 11/21/2017 06/11/2012 06/08/2011  Does Patient Have a Medical Advance Directive? Yes Yes No Patient does not have advance directive Patient does not have advance directive  Type of Advance Directive [place] will [place] will - - -  Copy of [place] of the [place] Chart? No - copy requested No - copy requested - - -  Would patient like information on creating a medical advance directive? - - Yes (MAU/Ambulatory/Procedural Areas - Information given) - -  Pre-existing out of facility DNR order (yellow form or pink MOST form) - - - - No    Current Medications (verified) Outpatient Encounter Medications as of 01/13/2020  Medication Sig  . acetaminophen (TYLENOL) 500 MG tablet Take 1,000 mg by mouth daily.   . blood glucose meter kit and supplies KIT Dispense based on patient and insurance preference. Use up to four times daily as directed. (FOR ICD-9 250.00, 250.01).  . Blood Glucose Monitoring Suppl (FREESTYLE LITE) DEVI Use to test blood sugar 3 times a day. Dx code e11.65   . Continuous Blood Gluc Receiver (FREESTYLE LIBRE 2 READER) DEVI Use to check blood sugar 4 times a day. Dx code e11.65  . Continuous Blood Gluc Sensor (FREESTYLE LIBRE 2 SENSOR) MISC Use to check blood sugar 4 times a day. Dx code e11.65  . diltiazem (CARDIZEM CD) 240 MG 24 hr capsule TAKE 1 CAPSULE DAILY  . diphenhydramine-acetaminophen (TYLENOL PM) 25-500 MG TABS Take 2 tablets by mouth at bedtime.  . Dulaglutide (TRULICITY) 03.55MDD/2.2GUSOPN Inject 0.5 mLs (0.75 mg total) into the skin once a week.  . empagliflozin (JARDIANCE) 10 MG TABS tablet Take 1 tablet (10 mg total) by mouth daily before breakfast.  . fish oil-omega-3 fatty acids 1000 MG capsule Take 1 g by mouth daily.  . furosemide (LASIX) 20 MG tablet TAKE 1 TABLET TWICE A DAY, MAY TAKE AN EXTRA 20 MG (1 TABLET) AS NEEDED DAILY (PLEASE SCHEDULE AN APPOINTMENT FOR REFILLS)  . hydrALAZINE (APRESOLINE) 50 MG tablet TAKE 1 TABLET TWICE A DAY WITH FOOD  . loratadine (CLARITIN) 10 MG tablet TAKE 1 TABLET DAILY  . metoprolol tartrate (LOPRESSOR) 100 MG tablet TAKE 1 TABLET THREE TIMES A DAY  . Multiple Vitamin (MULITIVITAMIN WITH MINERALS) TABS Take 1 tablet by mouth daily.  . NON FORMULARY at bedtime. CPAP  . pravastatin (PRAVACHOL) 80 MG tablet TAKE 1 TABLET DAILY  . psyllium (REGULOID) 0.52 g capsule Take 5 capsules by mouth daily.  . silver sulfADIAZINE (SILVADENE) 1 % cream APPLY TOPICALLY A ONE-SIXTEENTH ( 1/16 ) INCH LAYER TO ENTIRE BURN TWICE A DAY  . warfarin (COUMADIN) 4 MG tablet  TAKE ONE TO ONE AND ONE-HALF TABLETS DAILY AS DIRECTED PER COUMADIN CLINIC   No facility-administered encounter medications on file as of 01/13/2020.    Allergies (verified) Patient has no known allergies.   History: Past Medical History:  Diagnosis Date  . DVT of lower extremity, bilateral ([place]) 06/17/2012   Right CFV and popliteal vein, left C. it the only;  also interstitial fluid noted in both calfs  . Dyslipidemia   . History of  echocardiogram 02/07/2011   EF 50-60%; RV mildly dilated, LA moderately dilated; mild MR; mild-mod TR; RV systolic pressure elevated; mildly sclerotic AV  . History of nuclear stress test 02/07/2011   lexiscan; mild ischemia in mid inferolateral & apical lateral regions; low risk scan  . History of: Bacteremia due to coagulase-negative Staphylococcus 06/13/2012   During hospitalization May 30 through 06/19/2012   . History: TTP (thrombotic thrombocytopenic purpura) 06/11/2012  . Hypertension   . Obesity (BMI 30-39.9)   . OSA on CPAP   . Persistent atrial fibrillation ([place]) 02/2011   Negative Myoview; Normal EF by Echo  . Severe sepsis with acute organ dysfunction ([place]) 5/30-6/12/2012   Prolonged hospitalization for presumed sepsis and septic shock compounded by coagulopathy thought to be TTP versus Pradaxa mediated. Temporarily on hemodialysis and plasmapheresis.   Past Surgical History:  Procedure Laterality Date  . CARDIOVERSION  06/08/2011   Procedure: CARDIOVERSION;  Surgeon: Leonie Man, MD;  Location: [facility];  Service: Cardiovascular;  Laterality: N/A;  . TOTAL KNEE ARTHROPLASTY     Left knee 2010, Right knee 2011   Family History  Problem Relation Age of Onset  . Heart failure Mother 69  . Cancer Father        stomach   Social History   Socioeconomic History  . Marital status: Divorced    Spouse name: Not on file  . Number of children: 1  . Years of education: Not on file  . Highest education level: Not on file  Occupational History  . Occupation: retired  Tobacco Use  . Smoking status: Former Smoker    Types: Cigars  . Smokeless tobacco: Never Used  Vaping Use  . Vaping Use: Never used  Substance and Sexual Activity  . Alcohol use: No    Comment: couple beers a year  . Drug use: No  . Sexual activity: Not Currently  Other Topics Concern  . Not on file  Social History Narrative   He is a divorced father of one. Prior to this hospitalization he was walking 5-10  minutes a day 4 times a week. He now is gradually building up his rehabilitation doing mild exercise daily.      He quit smoking in 1968 and rarely has alcohol.   Social Determinants of Health   Financial Resource Strain: Low Risk   . Difficulty of Paying Living Expenses: Not hard at all  Food Insecurity: No Food Insecurity  . Worried About Charity fundraiser in the Last Year: Never true  . Ran Out of Food in the Last Year: Never true  Transportation Needs: No Transportation Needs  . Lack of Transportation (Medical): No  . Lack of Transportation (Non-Medical): No  Physical Activity: Inactive  . Days of Exercise per Week: 0 days  . Minutes of Exercise per Session: 0 min  Stress: No Stress Concern Present  . Feeling of Stress : Not at all  Social Connections: Not on file    Tobacco Counseling Counseling given: Not Answered  Clinical Intake:  Pre-visit preparation completed: Yes        Nutritional Status: BMI > 30  Obese Nutritional Risks: None Diabetes: Yes  How often do you need to have someone help you when you read instructions, pamphlets, or other written materials from your doctor or pharmacy?: 1 - Never What is the last grade level you completed in school?: 14 years  Diabetic? Yes Nutrition Risk Assessment:  Has the patient had any N/V/D within the last 2 months?  No  Does the patient have any non-healing wounds?  No  Has the patient had any unintentional weight loss or weight gain?  No   Diabetes:  Is the patient diabetic?  Yes  If diabetic, was a CBG obtained today?  No  Did the patient bring in their glucometer from home?  No  How often do you monitor your CBG's? Does not.   Financial Strains and Diabetes Management:  Are you having any financial strains with the device, your supplies or your medication? No .  Does the patient want to be seen by Chronic Care Management for management of their diabetes?  No  Would the patient like to be referred to a  Nutritionist or for Diabetic Management?  No   Diabetic Exams:  Diabetic Eye Exam: Overdue for diabetic eye exam. Pt has been advised about the importance in completing this exam. Patient advised to call and schedule an eye exam. Diabetic Foot Exam: Overdue, Pt has been advised about the importance in completing this exam. Pt is scheduled for diabetic foot exam on next appointment.   Interpreter Needed?: No  Information entered by :: NAllen LPN   Activities of Daily Living In your present state of health, do you have any difficulty performing the following activities: 01/13/2020  Hearing? N  Vision? N  Difficulty concentrating or making decisions? N  Walking or climbing stairs? N  Dressing or bathing? N  Doing errands, shopping? N  Preparing Food and eating ? N  Using the Toilet? N  In the past six months, have you accidently leaked urine? N  Do you have problems with loss of bowel control? N  Managing your Medications? N  Managing your Finances? N  Housekeeping or managing your Housekeeping? N  Some recent data might be hidden    Patient Care Team: [name], FNP as PCP - General ([place]) Suella Broad, FNP as Nurse Practitioner (Psychiatry)  Indicate any recent Medical Services you may have received from other than [facility] providers in the past year (date may be approximate).     Assessment:   This is a routine wellness examination for Brett [name].  Hearing/Vision screen No exam data present  Dietary issues and exercise activities discussed: Current Exercise Habits: The patient does not participate in regular exercise at present  Goals    .  DIET - INCREASE WATER INTAKE (pt-stated)      Decrease salt, lose weight    .  Patient Stated      11/27/2018, wants to avoid covid-19    .  Patient Stated      01/13/2020, stay alive      Depression Screen PHQ 2/9 Scores 01/13/2020 05/28/2019 11/27/2018 05/22/2018 11/21/2017 11/21/2017  PHQ - 2 Score 0 0 0 0  0 0  PHQ- 9 Score - - 0 - - -    Fall Risk Fall Risk  05/28/2019 11/27/2018 05/22/2018 11/21/2017 11/21/2017  Falls in the past year? 0 0 0 0 0  Comment - - - - -  Number falls in past yr: 0 - - - -  Injury with Fall? 0 - - - -  Risk for fall due to : - Impaired mobility;Impaired balance/gait;Medication side effect - Medication side effect -  Follow up - Falls evaluation completed;Education provided;Falls prevention discussed - - -    FALL RISK PREVENTION PERTAINING TO THE HOME:  Any stairs in or around the home? Yes  If so, are there any without handrails? No  Home free of loose throw rugs in walkways, pet beds, electrical cords, etc? Yes  Adequate lighting in your home to reduce risk of falls? Yes   ASSISTIVE DEVICES UTILIZED TO PREVENT FALLS:  Life alert? No  Use of a cane, walker or w/c? Yes  Grab bars in the bathroom? Yes  Shower chair or bench in shower? No  Elevated toilet seat or a handicapped toilet? No   TIMED UP AND GO:  Was the test performed? No .   Gait slow and steady with assistive device  Cognitive Function:     6CIT Screen 01/13/2020 11/27/2018 11/21/2017  What Year? 0 points 0 points 0 points  What month? 0 points 0 points 0 points  What time? 0 points 0 points 0 points  Count back from 20 0 points 0 points 0 points  Months in reverse 0 points 0 points 0 points  Repeat phrase 2 points 4 points 0 points  Total Score 2 4 0    Immunizations Immunization History  Administered Date(s) Administered  . Fluad Quad(high Dose 65+) 09/09/2019  . Influenza, High Dose Seasonal PF 11/27/2018  . Influenza-Unspecified 12/08/2012  . PFIZER SARS-COV-2 Vaccination 02/22/2019, 03/24/2019    TDAP status: Up to date  Flu Vaccine status: Up to date  Pneumococcal vaccine status: Declined,  Education has been provided regarding the importance of this vaccine but patient still declined. Advised may receive this vaccine at local pharmacy or Health Dept. Aware to  provide a copy of the vaccination record if obtained from local pharmacy or Health Dept. Verbalized acceptance and understanding.   Covid-19 vaccine status: Completed vaccines  Qualifies for Shingles Vaccine? Yes   Zostavax completed No   Shingrix Completed?: No.    Education has been provided regarding the importance of this vaccine. Patient has been advised to call insurance company to determine out of pocket expense if they have not yet received this vaccine. Advised may also receive vaccine at local pharmacy or Health Dept. Verbalized acceptance and understanding.  Screening Tests Health Maintenance  Topic Date Due  . FOOT EXAM  Never done  . PNA vac Low Risk Adult (1 of 2 - PCV13) Never done  . OPHTHALMOLOGY EXAM  03/15/2018  . COVID-19 Vaccine (3 - Booster for Pfizer series) 09/24/2019  . HEMOGLOBIN A1C  03/08/2020  . COLONOSCOPY (Pts 45-31yr Insurance coverage will need to be confirmed)  06/29/2020  . TETANUS/TDAP  09/16/2022  . INFLUENZA VACCINE  Completed  . Hepatitis C Screening  Completed    Health Maintenance  Health Maintenance Due  Topic Date Due  . FOOT EXAM  Never done  . PNA vac Low Risk Adult (1 of 2 - PCV13) Never done  . OPHTHALMOLOGY EXAM  03/15/2018  . COVID-19 Vaccine (3 - Booster for Pfizer series) 09/24/2019    Colorectal cancer screening: Type of screening: Colonoscopy. Completed 06/30/2010. Repeat every 10 years  Lung Cancer Screening: (Low Dose CT Chest recommended if Age 76-80years, 30 pack-year currently smoking OR have quit w/in 15years.) does not  qualify.   Lung Cancer Screening Referral: no  Additional Screening:  Hepatitis C Screening: does qualify; Completed 05/22/2018  Vision Screening: Recommended annual ophthalmology exams for early detection of glaucoma and other disorders of the eye. Is the patient up to date with their annual eye exam?  No  Who is the provider or what is the name of the office in which the patient attends annual eye  exams? Dr. Katy Fitch If pt is not established with a provider, would they like to be referred to a provider to establish care? No .   Dental Screening: Recommended annual dental exams for proper oral hygiene  Community Resource Referral / Chronic Care Management: CRR required this visit?  No   CCM required this visit?  No      Plan:     I have personally reviewed and noted the following in the patient's chart:   . Medical and social history . Use of alcohol, tobacco or illicit drugs  . Current medications and supplements . Functional ability and status . Nutritional status . Physical activity . Advanced directives . List of other physicians . Hospitalizations, surgeries, and ER visits in previous 12 months . Vitals . Screenings to include cognitive, depression, and falls . Referrals and appointments  In addition, I have reviewed and discussed with patient certain preventive protocols, quality metrics, and best practice recommendations. A written personalized care plan for preventive services as well as general preventive health recommendations were provided to patient.     Kellie Simmering, LPN   01/13/1094   Nurse Notes:

## 2020-01-14 LAB — CMP14+EGFR
ALT: 21 IU/L (ref 0–44)
AST: 30 IU/L (ref 0–40)
Albumin/Globulin Ratio: 1.2 (ref 1.2–2.2)
Albumin: 4.4 g/dL (ref 3.7–4.7)
Alkaline Phosphatase: 82 IU/L (ref 44–121)
BUN/Creatinine Ratio: 16 (ref 10–24)
BUN: 22 mg/dL (ref 8–27)
Bilirubin Total: 0.5 mg/dL (ref 0.0–1.2)
CO2: 28 mmol/L (ref 20–29)
Calcium: 9.8 mg/dL (ref 8.6–10.2)
Chloride: 97 mmol/L (ref 96–106)
Creatinine, Ser: 1.34 mg/dL — ABNORMAL HIGH (ref 0.76–1.27)
GFR calc Af Amer: 59 mL/min/{1.73_m2} — ABNORMAL LOW (ref >59)
GFR calc non Af Amer: 51 mL/min/{1.73_m2} — ABNORMAL LOW (ref >59)
Globulin, Total: 3.6 g/dL (ref 1.5–4.5)
Glucose: 105 mg/dL — ABNORMAL HIGH (ref 65–99)
Potassium: 4.6 mmol/L (ref 3.5–5.2)
Sodium: 141 mmol/L (ref 134–144)
Total Protein: 8 g/dL (ref 6.0–8.5)

## 2020-01-14 LAB — LIPID PANEL
Chol/HDL Ratio: 4 ratio (ref 0.0–5.0)
Cholesterol, Total: 103 mg/dL (ref 100–199)
HDL: 26 mg/dL — ABNORMAL LOW (ref >39)
LDL Chol Calc (NIH): 52 mg/dL (ref 0–99)
Triglycerides: 142 mg/dL (ref 0–149)
VLDL Cholesterol Cal: 25 mg/dL (ref 5–40)

## 2020-01-14 LAB — HEMOGLOBIN A1C
Est. average glucose Bld gHb Est-mCnc: 128 mg/dL
Hgb A1c MFr Bld: 6.1 % — ABNORMAL HIGH (ref 4.8–5.6)

## 2020-02-01 ENCOUNTER — Other Ambulatory Visit: Payer: Self-pay | Admitting: Nurse Practitioner

## 2020-02-03 ENCOUNTER — Other Ambulatory Visit: Payer: Self-pay

## 2020-02-03 ENCOUNTER — Ambulatory Visit (INDEPENDENT_AMBULATORY_CARE_PROVIDER_SITE_OTHER): Payer: Medicare Other

## 2020-02-03 DIAGNOSIS — Z7901 Long term (current) use of anticoagulants: Secondary | ICD-10-CM | POA: Diagnosis not present

## 2020-02-03 DIAGNOSIS — I4819 Other persistent atrial fibrillation: Secondary | ICD-10-CM | POA: Diagnosis not present

## 2020-02-03 DIAGNOSIS — Z5181 Encounter for therapeutic drug level monitoring: Secondary | ICD-10-CM

## 2020-02-03 LAB — POCT INR: INR: 1.9 — AB (ref 2.0–3.0)

## 2020-02-03 NOTE — Patient Instructions (Signed)
Take 1.5 tablets tonight only and then Continue to 1.5 tablets daily except 1 tablet on Sundays and Wednesdays.  Repeat INR in 6 weeks.

## 2020-02-09 ENCOUNTER — Other Ambulatory Visit: Payer: Self-pay | Admitting: Cardiology

## 2020-02-19 ENCOUNTER — Other Ambulatory Visit: Payer: Self-pay | Admitting: Nurse Practitioner

## 2020-03-16 ENCOUNTER — Other Ambulatory Visit: Payer: Self-pay

## 2020-03-16 ENCOUNTER — Ambulatory Visit (INDEPENDENT_AMBULATORY_CARE_PROVIDER_SITE_OTHER): Payer: Medicare Other

## 2020-03-16 DIAGNOSIS — Z5181 Encounter for therapeutic drug level monitoring: Secondary | ICD-10-CM

## 2020-03-16 DIAGNOSIS — Z7901 Long term (current) use of anticoagulants: Secondary | ICD-10-CM

## 2020-03-16 DIAGNOSIS — I4819 Other persistent atrial fibrillation: Secondary | ICD-10-CM | POA: Diagnosis not present

## 2020-03-16 LAB — POCT INR: INR: 2.1 (ref 2.0–3.0)

## 2020-03-16 NOTE — Patient Instructions (Signed)
Continue to 1.5 tablets daily except 1 tablet on Sundays and Wednesdays.  Repeat INR in 8 weeks.

## 2020-04-12 ENCOUNTER — Ambulatory Visit (INDEPENDENT_AMBULATORY_CARE_PROVIDER_SITE_OTHER): Payer: Medicare Other | Admitting: Nurse Practitioner

## 2020-04-12 ENCOUNTER — Encounter: Payer: Self-pay | Admitting: Nurse Practitioner

## 2020-04-12 ENCOUNTER — Other Ambulatory Visit: Payer: Self-pay

## 2020-04-12 VITALS — BP 132/78 | HR 57 | Temp 98.0°F | Ht 65.0 in | Wt 305.2 lb

## 2020-04-12 DIAGNOSIS — M7989 Other specified soft tissue disorders: Secondary | ICD-10-CM

## 2020-04-12 DIAGNOSIS — R21 Rash and other nonspecific skin eruption: Secondary | ICD-10-CM | POA: Diagnosis not present

## 2020-04-12 DIAGNOSIS — E782 Mixed hyperlipidemia: Secondary | ICD-10-CM

## 2020-04-12 DIAGNOSIS — R7303 Prediabetes: Secondary | ICD-10-CM

## 2020-04-12 DIAGNOSIS — E119 Type 2 diabetes mellitus without complications: Secondary | ICD-10-CM | POA: Diagnosis not present

## 2020-04-12 DIAGNOSIS — I10 Essential (primary) hypertension: Secondary | ICD-10-CM | POA: Diagnosis not present

## 2020-04-12 NOTE — Progress Notes (Signed)
I,Brett [name],acting as a Education administrator for [name], FNP.,have documented all relevant documentation on the behalf of [name], FNP,as directed by  [name], FNP while in the presence of [name], [place].   This visit occurred during the SARS-CoV-2 public health emergency.  Safety protocols were in place, including screening questions prior to the visit, additional usage of staff PPE, and extensive cleaning of exam room while observing appropriate contact time as indicated for disinfecting solutions.  Subjective:     Patient ID: Brett [name] , male    DOB: April 03, 1945 , 75 y.o.   MRN: [id number]   Chief Complaint  Patient presents with  . Diabetes  . Hypertension    HPI  Patient presents today for a f/u on her diabetes and blood pressure.     Diabetes He presents for his follow-up diabetic visit. He has type 2 diabetes mellitus. His disease course has been stable. Pertinent negatives for hypoglycemia include no headaches. Pertinent negatives for diabetes include no chest pain. There are no hypoglycemic complications. There are no diabetic complications. Risk factors for coronary artery disease include sedentary lifestyle, obesity, male sex, hypertension and diabetes mellitus. Current diabetic treatment includes oral agent (dual therapy). He is compliant with treatment all of the time. He is following a generally unhealthy diet. When asked about meal planning, he reported none. He has not had a previous visit with a dietitian. He rarely participates in exercise. There is no change in his home blood glucose trend. (He reports sometimes his glucometer works and sometimes it does not. Does not power up right.  ) He does not see a podiatrist. Hypertension This is a chronic problem. The current episode started more than 1 year ago. The problem has been gradually worsening since onset. The problem is uncontrolled. Pertinent negatives include no anxiety, chest pain, headaches or  palpitations. There are no associated agents to hypertension. Risk factors for coronary artery disease include obesity and male gender. Past treatments include ACE inhibitors and alpha 1 blockers. The current treatment provides moderate improvement. There are no compliance problems.  There is no history of angina or kidney disease. There is no history of chronic renal disease.     Past Medical History:  Diagnosis Date  . DVT of lower extremity, bilateral ([place]) 06/17/2012   Right CFV and popliteal vein, left C. it the only;  also interstitial fluid noted in both calfs  . Dyslipidemia   . History of echocardiogram 02/07/2011   EF 50-60%; RV mildly dilated, LA moderately dilated; mild MR; mild-mod TR; RV systolic pressure elevated; mildly sclerotic AV  . History of nuclear stress test 02/07/2011   lexiscan; mild ischemia in mid inferolateral & apical lateral regions; low risk scan  . History of: Bacteremia due to coagulase-negative Staphylococcus 06/13/2012   During hospitalization May 30 through 06/19/2012   . History: TTP (thrombotic thrombocytopenic purpura) 06/11/2012  . Hypertension   . Obesity (BMI 30-39.9)   . OSA on CPAP   . Persistent atrial fibrillation ([place]) 02/2011   Negative Myoview; Normal EF by Echo  . Severe sepsis with acute organ dysfunction ([place]) 5/30-6/12/2012   Prolonged hospitalization for presumed sepsis and septic shock compounded by coagulopathy thought to be TTP versus Pradaxa mediated. Temporarily on hemodialysis and plasmapheresis.     Family History  Problem Relation Age of Onset  . Heart failure Mother 57  . Cancer Father        stomach     Current Outpatient Medications:  .  acetaminophen (TYLENOL) 500 MG tablet, Take 1,000 mg by mouth daily. , Disp: , Rfl:  .  blood glucose meter kit and supplies KIT, Dispense based on patient and insurance preference. Use up to four times daily as directed. (FOR ICD-9 250.00, 250.01)., Disp: 1 each, Rfl: 0 .  Blood Glucose  Monitoring Suppl (FREESTYLE LITE) DEVI, Use to test blood sugar 3 times a day. Dx code e11.65, Disp: 1 each, Rfl: 3 .  diltiazem (CARDIZEM CD) 240 MG 24 hr capsule, TAKE 1 CAPSULE DAILY, Disp: 90 capsule, Rfl: 3 .  diphenhydramine-acetaminophen (TYLENOL PM) 25-500 MG TABS, Take 2 tablets by mouth at bedtime., Disp: , Rfl:  .  Dulaglutide (TRULICITY) 1.44 RX/5.4MG SOPN, Inject 0.75 mg into the skin once a week., Disp: 6 mL, Rfl: 1 .  empagliflozin (JARDIANCE) 10 MG TABS tablet, Take 1 tablet (10 mg total) by mouth daily before breakfast., Disp: 90 tablet, Rfl: 1 .  fish oil-omega-3 fatty acids 1000 MG capsule, Take 1 g by mouth daily., Disp: , Rfl:  .  furosemide (LASIX) 20 MG tablet, TAKE 1 TABLET TWICE A DAY, MAY TAKE AN EXTRA 20 MG (1 TABLET) AS NEEDED DAILY (PLEASE SCHEDULE AN APPOINTMENT FOR REFILLS), Disp: 270 tablet, Rfl: 3 .  hydrALAZINE (APRESOLINE) 50 MG tablet, TAKE 1 TABLET TWICE A DAY WITH FOOD, Disp: 180 tablet, Rfl: 3 .  loratadine (CLARITIN) 10 MG tablet, TAKE 1 TABLET DAILY, Disp: 90 tablet, Rfl: 3 .  metFORMIN (GLUCOPHAGE-XR) 750 MG 24 hr tablet, TAKE 1 TABLET DAILY, Disp: 90 tablet, Rfl: 3 .  metoprolol tartrate (LOPRESSOR) 100 MG tablet, TAKE 1 TABLET THREE TIMES A DAY, Disp: 270 tablet, Rfl: 3 .  Multiple Vitamin (MULITIVITAMIN WITH MINERALS) TABS, Take 1 tablet by mouth daily., Disp: , Rfl:  .  NON FORMULARY, at bedtime. CPAP, Disp: , Rfl:  .  pravastatin (PRAVACHOL) 80 MG tablet, TAKE 1 TABLET DAILY, Disp: 90 tablet, Rfl: 3 .  psyllium (REGULOID) 0.52 g capsule, Take 5 capsules by mouth daily., Disp: , Rfl:  .  silver sulfADIAZINE (SILVADENE) 1 % cream, APPLY TOPICALLY A ONE-SIXTEENTH ( 1/16 ) INCH LAYER TO ENTIRE BURN TWICE A DAY, Disp: 200 g, Rfl: 3 .  warfarin (COUMADIN) 4 MG tablet, TAKE ONE TO ONE AND ONE-HALF TABLETS DAILY AS DIRECTED PER COUMADIN CLINIC, Disp: 135 tablet, Rfl: 3   No Known Allergies   Review of Systems  Constitutional: Negative.   HENT: Negative.    Eyes: Negative.   Respiratory: Negative.   Cardiovascular: Positive for leg swelling. Negative for chest pain and palpitations.  Gastrointestinal: Negative.   Endocrine: Negative.   Genitourinary: Negative.   Musculoskeletal: Negative.   Skin: Negative.        Lower extremities with scaly rash and cratered areas. He is applying silvadene cream  Allergic/Immunologic: Negative.   Neurological: Negative.  Negative for headaches.  Hematological: Negative.   Psychiatric/Behavioral: Negative.      Today's Vitals   04/12/20 1420  BP: 132/78  Pulse: (!) 57  Temp: 98 F (36.7 C)  SpO2: 99%  Weight: (!) 305 lb 3.2 oz (138.4 kg)  Height: 5' 5"  (1.651 m)  PainSc: 0-No pain   Body mass index is 50.79 kg/m.  Wt Readings from Last 3 Encounters:  04/12/20 (!) 305 lb 3.2 oz (138.4 kg)  01/13/20 (!) 303 lb 12.7 oz (137.8 kg)  01/13/20 (!) 303 lb 12.8 oz (137.8 kg)   Objective:  Physical Exam Vitals reviewed.  Constitutional:  General: He is not in acute distress.    Appearance: Normal appearance. He is obese.     Comments: Central obesity  Cardiovascular:     Rate and Rhythm: Normal rate and regular rhythm.     Pulses: Normal pulses.     Heart sounds: Normal heart sounds. No murmur heard.   Pulmonary:     Effort: No respiratory distress.     Breath sounds: Normal breath sounds. No wheezing.  Musculoskeletal:        General: No swelling.     Right lower leg: Edema (trace edema) present.     Left lower leg: Edema (more firm and 1+ edema) present.     Comments: Using a cane to ambulate. Left leg has a bulging area to his calf laterally, he also has a few cratered areas.   Skin:    General: Skin is warm and dry.     Capillary Refill: Capillary refill takes less than 2 seconds.     Findings: Rash (scaly rash to lower extremities with cratered areas) present.  Neurological:     General: No focal deficit present.     Mental Status: He is alert and oriented to person, place,  and time.     Cranial Nerves: No cranial nerve deficit.  Psychiatric:        Mood and Affect: Mood normal.        Behavior: Behavior normal.        Thought Content: Thought content normal.        Judgment: Judgment normal.         Assessment And Plan:     1. Type 2 diabetes mellitus without complication, without long-term current use of insulin (HCC)  Chronic, stable  Continue with current medications  Encouraged to limit intake of sugary foods and drinks  Encouraged to increase physical activity to 150 minutes per week as tolerated - CMP14+EGFR - Hemoglobin A1c - Lipid panel  2. Essential hypertension . B/P is fairly controlled.  . CMP ordered to check renal function.  . The importance of regular exercise and dietary modification was stressed to the patient.  - CMP14+EGFR  3. Mixed hyperlipidemia  Chronic, controlled  Continue with current medications, tolerating medications well - Lipid panel  4. Leg swelling  This is chronic, he has a history of chronic, venous stasis ulcerations  I have encouraged him to wear compression socks as this would help with the circulation  5. Rash and nonspecific skin eruption  He has scaly rash to bilateral lower extremities with left worse than right. This may be related to his poor circulation but needs further evaluation - Ambulatory referral to Dermatology     Patient was given opportunity to ask questions. Patient verbalized understanding of the plan and was able to repeat key elements of the plan. All questions were answered to their satisfaction.  [name], FNP   I, [name], FNP, have reviewed all documentation for this visit. The documentation on 04/12/20 for the exam, diagnosis, procedures, and orders are all accurate and complete.   IF YOU HAVE BEEN REFERRED TO A SPECIALIST, IT MAY TAKE 1-2 WEEKS TO SCHEDULE/PROCESS THE REFERRAL. IF YOU HAVE NOT HEARD FROM US/SPECIALIST IN TWO WEEKS, PLEASE GIVE [place] A CALL AT  [phone].   THE PATIENT IS ENCOURAGED TO PRACTICE SOCIAL DISTANCING DUE TO THE COVID-19 PANDEMIC.

## 2020-04-13 LAB — LIPID PANEL
Chol/HDL Ratio: 4 ratio (ref 0.0–5.0)
Cholesterol, Total: 107 mg/dL (ref 100–199)
HDL: 27 mg/dL — ABNORMAL LOW (ref >39)
LDL Chol Calc (NIH): 52 mg/dL (ref 0–99)
Triglycerides: 166 mg/dL — ABNORMAL HIGH (ref 0–149)
VLDL Cholesterol Cal: 28 mg/dL (ref 5–40)

## 2020-04-13 LAB — CMP14+EGFR
ALT: 19 IU/L (ref 0–44)
AST: 27 IU/L (ref 0–40)
Albumin/Globulin Ratio: 1.3 (ref 1.2–2.2)
Albumin: 4.4 g/dL (ref 3.7–4.7)
Alkaline Phosphatase: 83 IU/L (ref 44–121)
BUN/Creatinine Ratio: 16 (ref 10–24)
BUN: 18 mg/dL (ref 8–27)
Bilirubin Total: 0.4 mg/dL (ref 0.0–1.2)
CO2: 25 mmol/L (ref 20–29)
Calcium: 9.4 mg/dL (ref 8.6–10.2)
Chloride: 101 mmol/L (ref 96–106)
Creatinine, Ser: 1.11 mg/dL (ref 0.76–1.27)
Globulin, Total: 3.4 g/dL (ref 1.5–4.5)
Glucose: 100 mg/dL — ABNORMAL HIGH (ref 65–99)
Potassium: 4.4 mmol/L (ref 3.5–5.2)
Sodium: 143 mmol/L (ref 134–144)
Total Protein: 7.8 g/dL (ref 6.0–8.5)
eGFR: 69 mL/min/{1.73_m2} (ref >59)

## 2020-04-13 LAB — HEMOGLOBIN A1C
Est. average glucose Bld gHb Est-mCnc: 126 mg/dL
Hgb A1c MFr Bld: 6 % — ABNORMAL HIGH (ref 4.8–5.6)

## 2020-05-04 ENCOUNTER — Telehealth: Payer: Self-pay

## 2020-05-04 NOTE — Telephone Encounter (Signed)
I left pt vm to call the office Dr.Haverstock's office has been trying to contact him for an appointment. [facility]  [facility]  [address] [place], [place] [address] [phone]

## 2020-05-11 ENCOUNTER — Ambulatory Visit (INDEPENDENT_AMBULATORY_CARE_PROVIDER_SITE_OTHER): Payer: Medicare Other

## 2020-05-11 ENCOUNTER — Other Ambulatory Visit: Payer: Self-pay

## 2020-05-11 DIAGNOSIS — Z5181 Encounter for therapeutic drug level monitoring: Secondary | ICD-10-CM | POA: Diagnosis not present

## 2020-05-11 DIAGNOSIS — I4819 Other persistent atrial fibrillation: Secondary | ICD-10-CM

## 2020-05-11 DIAGNOSIS — Z7901 Long term (current) use of anticoagulants: Secondary | ICD-10-CM

## 2020-05-11 LAB — POCT INR: INR: 2.8 (ref 2.0–3.0)

## 2020-05-11 NOTE — Patient Instructions (Signed)
Continue to 1.5 tablets daily except 1 tablet on Sundays and Wednesdays.  Repeat INR in 8 weeks.   

## 2020-06-23 ENCOUNTER — Other Ambulatory Visit: Payer: Self-pay | Admitting: Nurse Practitioner

## 2020-07-01 ENCOUNTER — Other Ambulatory Visit: Payer: Self-pay | Admitting: Nurse Practitioner

## 2020-07-01 DIAGNOSIS — E119 Type 2 diabetes mellitus without complications: Secondary | ICD-10-CM

## 2020-07-05 ENCOUNTER — Other Ambulatory Visit: Payer: Self-pay | Admitting: Cardiology

## 2020-07-06 ENCOUNTER — Other Ambulatory Visit: Payer: Self-pay

## 2020-07-06 ENCOUNTER — Ambulatory Visit (INDEPENDENT_AMBULATORY_CARE_PROVIDER_SITE_OTHER): Payer: Medicare Other | Admitting: Pharmacist Clinician (PhC)/ Clinical Pharmacy Specialist

## 2020-07-06 DIAGNOSIS — Z7901 Long term (current) use of anticoagulants: Secondary | ICD-10-CM | POA: Diagnosis not present

## 2020-07-06 DIAGNOSIS — I4819 Other persistent atrial fibrillation: Secondary | ICD-10-CM | POA: Diagnosis not present

## 2020-07-06 LAB — POCT INR: INR: 1.9 — AB (ref 2.0–3.0)

## 2020-07-12 ENCOUNTER — Other Ambulatory Visit: Payer: Self-pay

## 2020-07-12 ENCOUNTER — Ambulatory Visit (INDEPENDENT_AMBULATORY_CARE_PROVIDER_SITE_OTHER): Payer: Medicare Other | Admitting: Nurse Practitioner

## 2020-07-12 ENCOUNTER — Encounter: Payer: Self-pay | Admitting: Nurse Practitioner

## 2020-07-12 VITALS — BP 124/78 | HR 62 | Temp 98.5°F | Ht 65.0 in | Wt 298.2 lb

## 2020-07-12 DIAGNOSIS — Z6841 Body Mass Index (BMI) 40.0 and over, adult: Secondary | ICD-10-CM

## 2020-07-12 DIAGNOSIS — I10 Essential (primary) hypertension: Secondary | ICD-10-CM | POA: Diagnosis not present

## 2020-07-12 DIAGNOSIS — E662 Morbid (severe) obesity with alveolar hypoventilation: Secondary | ICD-10-CM

## 2020-07-12 DIAGNOSIS — Z23 Encounter for immunization: Secondary | ICD-10-CM

## 2020-07-12 DIAGNOSIS — E119 Type 2 diabetes mellitus without complications: Secondary | ICD-10-CM

## 2020-07-12 DIAGNOSIS — Z1211 Encounter for screening for malignant neoplasm of colon: Secondary | ICD-10-CM | POA: Diagnosis not present

## 2020-07-12 MED ORDER — SHINGRIX 50 MCG/0.5ML IM SUSR: 0.5000 mL | Freq: Once | INTRAMUSCULAR | 0 refills | Status: AC

## 2020-07-12 NOTE — Progress Notes (Signed)
I,Yamilka Roman [employer] as a Education administrator for [employer], FNP.,have documented all relevant documentation on the behalf of [name], FNP,as directed by  [name], FNP while in the presence of [name], [place].  This visit occurred during the SARS-CoV-2 public health emergency.  Safety protocols were in place, including screening questions prior to the visit, additional usage of staff PPE, and extensive cleaning of exam room while observing appropriate contact time as indicated for disinfecting solutions.  Subjective:     Patient ID: Brett [name] , male    DOB: [birth year] , 75 y.o.   MRN: [id number]   Chief Complaint  Patient presents with   Diabetes   Hypertension    HPI  Patient presents today for a f/u on her diabetes and blood pressure. He has changed some things in his diet around.  He has cut his bagel out of his diet and will eat orange and cream cheese.   Wt Readings from Last 3 Encounters: 07/12/20 : 298 lb 3.2 oz (135.3 kg) 04/12/20 : (!) 305 lb 3.2 oz (138.4 kg) 01/13/20 : (!) 303 lb 12.7 oz (137.8 kg)     Diabetes He presents for his follow-up diabetic visit. He has type 2 diabetes mellitus. His disease course has been stable. Pertinent negatives for hypoglycemia include no dizziness or headaches. Pertinent negatives for diabetes include no chest pain. There are no hypoglycemic complications. There are no diabetic complications. Risk factors for coronary artery disease include sedentary lifestyle, obesity, male sex, hypertension and diabetes mellitus. Current diabetic treatment includes oral agent (dual therapy). He is compliant with treatment all of the time. He is following a generally unhealthy diet. When asked about meal planning, he reported none. He has not had a previous visit with a dietitian. He rarely participates in exercise. There is no change in his home blood glucose trend. (Continues to not be able to use his glucometer ) He does not see a  podiatrist. Hypertension This is a chronic problem. The current episode started more than 1 year ago. The problem has been gradually worsening since onset. The problem is uncontrolled. Pertinent negatives include no anxiety, chest pain, headaches or palpitations. There are no associated agents to hypertension. Risk factors for coronary artery disease include obesity and male gender. Past treatments include ACE inhibitors and alpha 1 blockers. The current treatment provides moderate improvement. There are no compliance problems.  There is no history of angina or kidney disease. There is no history of chronic renal disease.    Past Medical History:  Diagnosis Date   DVT of lower extremity, bilateral ([place]) 06/17/2012   Right CFV and popliteal vein, left C. it the only;  also interstitial fluid noted in both calfs   Dyslipidemia    History of echocardiogram 02/07/2011   EF 50-60%; RV mildly dilated, LA moderately dilated; mild MR; mild-mod TR; RV systolic pressure elevated; mildly sclerotic AV   History of nuclear stress test 02/07/2011   lexiscan; mild ischemia in mid inferolateral & apical lateral regions; low risk scan   History of: Bacteremia due to coagulase-negative Staphylococcus 06/13/2012   During hospitalization May 30 through 06/19/2012    History: TTP (thrombotic thrombocytopenic purpura) 06/11/2012   Hypertension    Obesity (BMI 30-39.9)    OSA on CPAP    Persistent atrial fibrillation ([place]) 02/2011   Negative Myoview; Normal EF by Echo   Severe sepsis with acute organ dysfunction ([place]) 5/30-6/12/2012   Prolonged hospitalization for presumed sepsis and septic shock compounded  by coagulopathy thought to be TTP versus Pradaxa mediated. Temporarily on hemodialysis and plasmapheresis.     Family History  Problem Relation Age of Onset   Heart failure Mother 36   Cancer Father        stomach     Current Outpatient Medications:    acetaminophen (TYLENOL) 500 MG tablet, Take 1,000 mg by  mouth daily. , Disp: , Rfl:    blood glucose meter kit and supplies KIT, Dispense based on patient and insurance preference. Use up to four times daily as directed. (FOR ICD-9 250.00, 250.01)., Disp: 1 each, Rfl: 0   Blood Glucose Monitoring Suppl (FREESTYLE LITE) DEVI, Use to test blood sugar 3 times a day. Dx code e11.65, Disp: 1 each, Rfl: 3   diltiazem (CARDIZEM CD) 240 MG 24 hr capsule, TAKE 1 CAPSULE DAILY, Disp: 90 capsule, Rfl: 3   diphenhydramine-acetaminophen (TYLENOL PM) 25-500 MG TABS, Take 2 tablets by mouth at bedtime., Disp: , Rfl:    fish oil-omega-3 fatty acids 1000 MG capsule, Take 1 g by mouth daily., Disp: , Rfl:    furosemide (LASIX) 20 MG tablet, TAKE 1 TABLET TWICE A DAY, MAY TAKE AN EXTRA 20 MG (1 TABLET) AS NEEDED DAILY (PLEASE SCHEDULE AN APPOINTMENT FOR REFILLS), Disp: 270 tablet, Rfl: 3   hydrALAZINE (APRESOLINE) 50 MG tablet, TAKE 1 TABLET TWICE A DAY WITH FOOD, Disp: 180 tablet, Rfl: 3   JARDIANCE 10 MG TABS tablet, TAKE 1 TABLET DAILY BEFORE BREAKFAST, Disp: 90 tablet, Rfl: 3   loratadine (CLARITIN) 10 MG tablet, TAKE 1 TABLET DAILY, Disp: 90 tablet, Rfl: 3   metFORMIN (GLUCOPHAGE-XR) 750 MG 24 hr tablet, TAKE 1 TABLET DAILY, Disp: 90 tablet, Rfl: 3   metoprolol tartrate (LOPRESSOR) 100 MG tablet, TAKE 1 TABLET THREE TIMES A DAY, Disp: 270 tablet, Rfl: 3   Multiple Vitamin (MULITIVITAMIN WITH MINERALS) TABS, Take 1 tablet by mouth daily., Disp: , Rfl:    NON FORMULARY, at bedtime. CPAP, Disp: , Rfl:    pravastatin (PRAVACHOL) 80 MG tablet, TAKE 1 TABLET DAILY, Disp: 90 tablet, Rfl: 3   psyllium (REGULOID) 0.52 g capsule, Take 5 capsules by mouth daily., Disp: , Rfl:    silver sulfADIAZINE (SILVADENE) 1 % cream, APPLY TOPICALLY A ONE-SIXTEENTH ( 1/16 ) INCH LAYER TO ENTIRE BURN TWICE A DAY, Disp: 341 g, Rfl: 3   TRULICITY 9.62 IW/9.7LG SOPN, INJECT 0.75 MG UNDER THE SKIN ONCE A WEEK, Disp: 6 mL, Rfl: 3   warfarin (COUMADIN) 4 MG tablet, TAKE ONE TO ONE AND ONE-HALF  TABLETS DAILY AS DIRECTED PER COUMADIN CLINIC, Disp: 135 tablet, Rfl: 0   Zoster Vaccine Adjuvanted (SHINGRIX) injection, Inject 0.5 mLs into the muscle once for 1 dose., Disp: 0.5 mL, Rfl: 0   No Known Allergies   Review of Systems  Constitutional: Negative.   Respiratory: Negative.  Negative for cough and wheezing.   Cardiovascular:  Negative for chest pain, palpitations and leg swelling.  Neurological:  Negative for dizziness and headaches.  Psychiatric/Behavioral: Negative.      Today's Vitals   07/12/20 1407  BP: 124/78  Pulse: 62  Temp: 98.5 F (36.9 C)  Weight: 298 lb 3.2 oz (135.3 kg)  Height: 5' 5"  (1.651 m)  PainSc: 0-No pain   Body mass index is 49.62 kg/m.   Objective:  Physical Exam Vitals reviewed.  Constitutional:      General: He is not in acute distress.    Appearance: Normal appearance. He is obese.  Comments: Central obesity  Cardiovascular:     Rate and Rhythm: Normal rate and regular rhythm.     Pulses: Normal pulses.     Heart sounds: Normal heart sounds. No murmur heard. Pulmonary:     Effort: No respiratory distress.     Breath sounds: Normal breath sounds. No wheezing.  Musculoskeletal:        General: No swelling.     Right lower leg: Edema (trace edema) present.     Left lower leg: Edema (more firm and 1+ edema) present.     Comments: Using a cane to ambulate. Left leg has a bulging area to his calf laterally, he also has a few cratered areas.   Skin:    General: Skin is warm and dry.     Capillary Refill: Capillary refill takes less than 2 seconds.     Findings: No rash.  Neurological:     General: No focal deficit present.     Mental Status: He is alert and oriented to person, place, and time.     Cranial Nerves: No cranial nerve deficit.  Psychiatric:        Mood and Affect: Mood normal.        Behavior: Behavior normal.        Thought Content: Thought content normal.        Judgment: Judgment normal.        Assessment And  Plan:     1. Type 2 diabetes mellitus without complication, without long-term current use of insulin (HCC) Chronic, slightly improved at last visit Continue with current medications - Lipid panel - CMP14+EGFR - Hemoglobin A1c  2. Essential hypertension B/P is well controlled.  CMP ordered to check renal function.  The importance of regular exercise and dietary modification was stressed to the patient.  - CMP14+EGFR  3. Class 3 obesity with alveolar hypoventilation and body mass index (BMI) of 45.0 to 49.9 in adult, unspecified whether serious comorbidity present ([place]) Chronic, he has lost approximately Discussed healthy diet and regular exercise options  Encouraged to exercise at least 150 minutes per week with 2 days of strength training  4. Encounter for screening colonoscopy According to USPTF Colorectal cancer Screening guidelines. Colonoscopy is recommended every 10 years, starting at age 37 years. Will refer to GI for colon cancer screening. - Ambulatory referral to Gastroenterology  5. Encounter for immunization Rx sent to pharmacy - Zoster Vaccine Adjuvanted [facility]) injection; Inject 0.5 mLs into the muscle once for 1 dose.  Dispense: 0.5 mL; Refill: 0     Patient was given opportunity to ask questions. Patient verbalized understanding of the plan and was able to repeat key elements of the plan. All questions were answered to their satisfaction.  [name], FNP   I, [name], FNP, have reviewed all documentation for this visit. The documentation on 07/12/20 for the exam, diagnosis, procedures, and orders are all accurate and complete.   IF YOU HAVE BEEN REFERRED TO A SPECIALIST, IT MAY TAKE 1-2 WEEKS TO SCHEDULE/PROCESS THE REFERRAL. IF YOU HAVE NOT HEARD FROM US/SPECIALIST IN TWO WEEKS, PLEASE GIVE [place] A CALL AT [phone].   THE PATIENT IS ENCOURAGED TO PRACTICE SOCIAL DISTANCING DUE TO THE COVID-19 PANDEMIC.

## 2020-07-12 NOTE — Patient Instructions (Signed)

## 2020-07-13 LAB — CMP14+EGFR
ALT: 18 IU/L (ref 0–44)
AST: 24 IU/L (ref 0–40)
Albumin/Globulin Ratio: 1.3 (ref 1.2–2.2)
Albumin: 4.4 g/dL (ref 3.7–4.7)
Alkaline Phosphatase: 83 IU/L (ref 44–121)
BUN/Creatinine Ratio: 19 (ref 10–24)
BUN: 25 mg/dL (ref 8–27)
Bilirubin Total: 0.6 mg/dL (ref 0.0–1.2)
CO2: 25 mmol/L (ref 20–29)
Calcium: 9.7 mg/dL (ref 8.6–10.2)
Chloride: 98 mmol/L (ref 96–106)
Creatinine, Ser: 1.31 mg/dL — ABNORMAL HIGH (ref 0.76–1.27)
Globulin, Total: 3.5 g/dL (ref 1.5–4.5)
Glucose: 93 mg/dL (ref 65–99)
Potassium: 4.9 mmol/L (ref 3.5–5.2)
Sodium: 139 mmol/L (ref 134–144)
Total Protein: 7.9 g/dL (ref 6.0–8.5)
eGFR: 57 mL/min/{1.73_m2} — ABNORMAL LOW (ref >59)

## 2020-07-13 LAB — HEMOGLOBIN A1C
Est. average glucose Bld gHb Est-mCnc: 126 mg/dL
Hgb A1c MFr Bld: 6 % — ABNORMAL HIGH (ref 4.8–5.6)

## 2020-07-13 LAB — LIPID PANEL
Chol/HDL Ratio: 4.3 ratio (ref 0.0–5.0)
Cholesterol, Total: 108 mg/dL (ref 100–199)
HDL: 25 mg/dL — ABNORMAL LOW (ref >39)
LDL Chol Calc (NIH): 52 mg/dL (ref 0–99)
Triglycerides: 188 mg/dL — ABNORMAL HIGH (ref 0–149)
VLDL Cholesterol Cal: 31 mg/dL (ref 5–40)

## 2020-07-14 ENCOUNTER — Encounter: Payer: Self-pay | Admitting: Nurse Practitioner

## 2020-07-21 ENCOUNTER — Other Ambulatory Visit: Payer: Self-pay | Admitting: Internal Medicine

## 2020-08-17 ENCOUNTER — Other Ambulatory Visit: Payer: Self-pay | Admitting: Nurse Practitioner

## 2020-08-31 ENCOUNTER — Ambulatory Visit (INDEPENDENT_AMBULATORY_CARE_PROVIDER_SITE_OTHER): Payer: Medicare Other | Admitting: Pharmacist

## 2020-08-31 ENCOUNTER — Other Ambulatory Visit: Payer: Self-pay

## 2020-08-31 DIAGNOSIS — D485 Neoplasm of uncertain behavior of skin: Secondary | ICD-10-CM | POA: Diagnosis not present

## 2020-08-31 DIAGNOSIS — L97919 Non-pressure chronic ulcer of unspecified part of right lower leg with unspecified severity: Secondary | ICD-10-CM | POA: Diagnosis not present

## 2020-08-31 DIAGNOSIS — I4819 Other persistent atrial fibrillation: Secondary | ICD-10-CM | POA: Diagnosis not present

## 2020-08-31 DIAGNOSIS — L97929 Non-pressure chronic ulcer of unspecified part of left lower leg with unspecified severity: Secondary | ICD-10-CM | POA: Diagnosis not present

## 2020-08-31 DIAGNOSIS — I83019 Varicose veins of right lower extremity with ulcer of unspecified site: Secondary | ICD-10-CM | POA: Diagnosis not present

## 2020-08-31 DIAGNOSIS — C44329 Squamous cell carcinoma of skin of other parts of face: Secondary | ICD-10-CM | POA: Diagnosis not present

## 2020-08-31 DIAGNOSIS — I83029 Varicose veins of left lower extremity with ulcer of unspecified site: Secondary | ICD-10-CM | POA: Diagnosis not present

## 2020-08-31 DIAGNOSIS — Z7901 Long term (current) use of anticoagulants: Secondary | ICD-10-CM

## 2020-08-31 DIAGNOSIS — I872 Venous insufficiency (chronic) (peripheral): Secondary | ICD-10-CM | POA: Diagnosis not present

## 2020-08-31 LAB — POCT INR: INR: 1.6 — AB (ref 2.0–3.0)

## 2020-08-31 NOTE — Patient Instructions (Addendum)
Description   Take 2 tablets today and then increase to 1.5 tablets daily except 1 tablet on Sunday.  Repeat INR in 3 weeks.  [phone]

## 2020-09-05 DIAGNOSIS — I83029 Varicose veins of left lower extremity with ulcer of unspecified site: Secondary | ICD-10-CM | POA: Diagnosis not present

## 2020-09-05 DIAGNOSIS — I83019 Varicose veins of right lower extremity with ulcer of unspecified site: Secondary | ICD-10-CM | POA: Diagnosis not present

## 2020-09-05 DIAGNOSIS — I872 Venous insufficiency (chronic) (peripheral): Secondary | ICD-10-CM | POA: Diagnosis not present

## 2020-09-05 DIAGNOSIS — L97929 Non-pressure chronic ulcer of unspecified part of left lower leg with unspecified severity: Secondary | ICD-10-CM | POA: Diagnosis not present

## 2020-09-05 DIAGNOSIS — L97919 Non-pressure chronic ulcer of unspecified part of right lower leg with unspecified severity: Secondary | ICD-10-CM | POA: Diagnosis not present

## 2020-09-13 DIAGNOSIS — I83019 Varicose veins of right lower extremity with ulcer of unspecified site: Secondary | ICD-10-CM | POA: Diagnosis not present

## 2020-09-13 DIAGNOSIS — L97929 Non-pressure chronic ulcer of unspecified part of left lower leg with unspecified severity: Secondary | ICD-10-CM | POA: Diagnosis not present

## 2020-09-13 DIAGNOSIS — L97919 Non-pressure chronic ulcer of unspecified part of right lower leg with unspecified severity: Secondary | ICD-10-CM | POA: Diagnosis not present

## 2020-09-13 DIAGNOSIS — I872 Venous insufficiency (chronic) (peripheral): Secondary | ICD-10-CM | POA: Diagnosis not present

## 2020-09-13 DIAGNOSIS — I83029 Varicose veins of left lower extremity with ulcer of unspecified site: Secondary | ICD-10-CM | POA: Diagnosis not present

## 2020-09-20 DIAGNOSIS — L97919 Non-pressure chronic ulcer of unspecified part of right lower leg with unspecified severity: Secondary | ICD-10-CM | POA: Diagnosis not present

## 2020-09-20 DIAGNOSIS — L97929 Non-pressure chronic ulcer of unspecified part of left lower leg with unspecified severity: Secondary | ICD-10-CM | POA: Diagnosis not present

## 2020-09-20 DIAGNOSIS — I83019 Varicose veins of right lower extremity with ulcer of unspecified site: Secondary | ICD-10-CM | POA: Diagnosis not present

## 2020-09-20 DIAGNOSIS — I83029 Varicose veins of left lower extremity with ulcer of unspecified site: Secondary | ICD-10-CM | POA: Diagnosis not present

## 2020-09-20 DIAGNOSIS — I872 Venous insufficiency (chronic) (peripheral): Secondary | ICD-10-CM | POA: Diagnosis not present

## 2020-09-21 ENCOUNTER — Ambulatory Visit (INDEPENDENT_AMBULATORY_CARE_PROVIDER_SITE_OTHER): Payer: Medicare Other

## 2020-09-21 ENCOUNTER — Other Ambulatory Visit: Payer: Self-pay

## 2020-09-21 DIAGNOSIS — Z5181 Encounter for therapeutic drug level monitoring: Secondary | ICD-10-CM

## 2020-09-21 DIAGNOSIS — Z7901 Long term (current) use of anticoagulants: Secondary | ICD-10-CM | POA: Diagnosis not present

## 2020-09-21 DIAGNOSIS — I4819 Other persistent atrial fibrillation: Secondary | ICD-10-CM | POA: Diagnosis not present

## 2020-09-21 LAB — POCT INR: INR: 2.1 (ref 2.0–3.0)

## 2020-09-21 NOTE — Patient Instructions (Signed)
Continue taking 1.5 tablets daily except 1 tablet on Sunday.  Repeat INR in 6 weeks.  [phone]

## 2020-09-27 DIAGNOSIS — I83019 Varicose veins of right lower extremity with ulcer of unspecified site: Secondary | ICD-10-CM | POA: Diagnosis not present

## 2020-09-27 DIAGNOSIS — L97919 Non-pressure chronic ulcer of unspecified part of right lower leg with unspecified severity: Secondary | ICD-10-CM | POA: Diagnosis not present

## 2020-09-27 DIAGNOSIS — I872 Venous insufficiency (chronic) (peripheral): Secondary | ICD-10-CM | POA: Diagnosis not present

## 2020-09-27 DIAGNOSIS — L97929 Non-pressure chronic ulcer of unspecified part of left lower leg with unspecified severity: Secondary | ICD-10-CM | POA: Diagnosis not present

## 2020-09-27 DIAGNOSIS — I83029 Varicose veins of left lower extremity with ulcer of unspecified site: Secondary | ICD-10-CM | POA: Diagnosis not present

## 2020-10-03 ENCOUNTER — Other Ambulatory Visit: Payer: Self-pay | Admitting: Cardiology

## 2020-10-04 DIAGNOSIS — C44329 Squamous cell carcinoma of skin of other parts of face: Secondary | ICD-10-CM | POA: Diagnosis not present

## 2020-10-11 DIAGNOSIS — Z23 Encounter for immunization: Secondary | ICD-10-CM | POA: Diagnosis not present

## 2020-10-11 DIAGNOSIS — L858 Other specified epidermal thickening: Secondary | ICD-10-CM | POA: Diagnosis not present

## 2020-10-11 DIAGNOSIS — L97919 Non-pressure chronic ulcer of unspecified part of right lower leg with unspecified severity: Secondary | ICD-10-CM | POA: Diagnosis not present

## 2020-10-11 DIAGNOSIS — I872 Venous insufficiency (chronic) (peripheral): Secondary | ICD-10-CM | POA: Diagnosis not present

## 2020-10-11 DIAGNOSIS — L97929 Non-pressure chronic ulcer of unspecified part of left lower leg with unspecified severity: Secondary | ICD-10-CM | POA: Diagnosis not present

## 2020-10-11 DIAGNOSIS — L853 Xerosis cutis: Secondary | ICD-10-CM | POA: Diagnosis not present

## 2020-10-11 DIAGNOSIS — I83029 Varicose veins of left lower extremity with ulcer of unspecified site: Secondary | ICD-10-CM | POA: Diagnosis not present

## 2020-10-11 DIAGNOSIS — I83019 Varicose veins of right lower extremity with ulcer of unspecified site: Secondary | ICD-10-CM | POA: Diagnosis not present

## 2020-10-12 ENCOUNTER — Ambulatory Visit (INDEPENDENT_AMBULATORY_CARE_PROVIDER_SITE_OTHER): Payer: Medicare Other | Admitting: Nurse Practitioner

## 2020-10-12 ENCOUNTER — Encounter: Payer: Self-pay | Admitting: Nurse Practitioner

## 2020-10-12 ENCOUNTER — Other Ambulatory Visit: Payer: Self-pay

## 2020-10-12 VITALS — BP 124/82 | HR 60 | Temp 98.4°F | Ht 65.0 in | Wt 297.4 lb

## 2020-10-12 DIAGNOSIS — R600 Localized edema: Secondary | ICD-10-CM | POA: Diagnosis not present

## 2020-10-12 DIAGNOSIS — Z6841 Body Mass Index (BMI) 40.0 and over, adult: Secondary | ICD-10-CM

## 2020-10-12 DIAGNOSIS — E119 Type 2 diabetes mellitus without complications: Secondary | ICD-10-CM | POA: Diagnosis not present

## 2020-10-12 DIAGNOSIS — R21 Rash and other nonspecific skin eruption: Secondary | ICD-10-CM

## 2020-10-12 DIAGNOSIS — Z1211 Encounter for screening for malignant neoplasm of colon: Secondary | ICD-10-CM

## 2020-10-12 DIAGNOSIS — E662 Morbid (severe) obesity with alveolar hypoventilation: Secondary | ICD-10-CM | POA: Diagnosis not present

## 2020-10-12 DIAGNOSIS — I10 Essential (primary) hypertension: Secondary | ICD-10-CM | POA: Diagnosis not present

## 2020-10-12 MED ORDER — AMMONIUM LACTATE 12 % EX LOTN: 1.0000 "application " | TOPICAL_LOTION | CUTANEOUS | 3 refills | Status: AC | PRN

## 2020-10-12 NOTE — Patient Instructions (Signed)

## 2020-10-12 NOTE — Progress Notes (Signed)
I,Brett [name],acting as a Education administrator for [employer], FNP.,have documented all relevant documentation on the behalf of Brett Brine, FNP,as directed by  Brett Brine, FNP while in the presence of Brett [name], Brett [name].   This visit occurred during the SARS-CoV-2 public health emergency.  Safety protocols were in place, including screening questions prior to the visit, additional usage of staff PPE, and extensive cleaning of exam room while observing appropriate contact time as indicated for disinfecting solutions.  Subjective:     Patient ID: Brett [name] , male    DOB: 1945-09-03 , 75 y.o.   MRN: [id number]   Chief Complaint  Patient presents with   Diabetes   Hypertension    HPI  Patient presents today for a f/u on her diabetes and blood pressure.    Diabetes He presents for his follow-up diabetic visit. He has type 2 diabetes mellitus. His disease course has been stable. Pertinent negatives for hypoglycemia include no dizziness or headaches. Pertinent negatives for diabetes include no chest pain. There are no hypoglycemic complications. There are no diabetic complications. Risk factors for coronary artery disease include sedentary lifestyle, obesity, male sex, hypertension and diabetes mellitus. Current diabetic treatment includes oral agent (dual therapy). He is compliant with treatment all of the time. He is following a generally unhealthy diet. When asked about meal planning, he reported none. He has not had a previous visit with a dietitian. He rarely participates in exercise. There is no change in his home blood glucose trend. (Blood sugar ranging 105-114 fasting 135-175 after eating and 2 hours after 124-150. ) He does not see a podiatrist. Hypertension This is a chronic problem. The current episode started more than 1 year ago. The problem has been gradually worsening since onset. The problem is uncontrolled. Pertinent negatives include no anxiety, chest pain, headaches or  palpitations. There are no associated agents to hypertension. Risk factors for coronary artery disease include obesity and male gender. Past treatments include ACE inhibitors and alpha 1 blockers. The current treatment provides moderate improvement. There are no compliance problems.  There is no history of angina or kidney disease. There is no history of chronic renal disease.    Past Medical History:  Diagnosis Date   DVT of lower extremity, bilateral ([place]) 06/17/2012   Right CFV and popliteal vein, left C. it the only;  also interstitial fluid noted in both calfs   Dyslipidemia    History of echocardiogram 02/07/2011   EF 50-60%; RV mildly dilated, LA moderately dilated; mild MR; mild-mod TR; RV systolic pressure elevated; mildly sclerotic AV   History of nuclear stress test 02/07/2011   lexiscan; mild ischemia in mid inferolateral & apical lateral regions; low risk scan   History of: Bacteremia due to coagulase-negative Staphylococcus 06/13/2012   During hospitalization May 30 through 06/19/2012    History: TTP (thrombotic thrombocytopenic purpura) 06/11/2012   Hypertension    Obesity (BMI 30-39.9)    OSA on CPAP    Persistent atrial fibrillation ([place]) 02/2011   Negative Myoview; Normal EF by Echo   Severe sepsis with acute organ dysfunction ([place]) 5/30-6/12/2012   Prolonged hospitalization for presumed sepsis and septic shock compounded by coagulopathy thought to be TTP versus Pradaxa mediated. Temporarily on hemodialysis and plasmapheresis.     Family History  Problem Relation Age of Onset   Heart failure Mother 6   Cancer Father        stomach     Current Outpatient Medications:    acetaminophen (TYLENOL) 500  MG tablet, Take 1,000 mg by mouth daily. , Disp: , Rfl:    ammonium lactate (AMLACTIN) 12 % lotion, Apply 1 application topically as needed for dry skin., Disp: 400 g, Rfl: 3   blood glucose meter kit and supplies KIT, Dispense based on patient and insurance preference. Use up to  four times daily as directed. (FOR ICD-9 250.00, 250.01)., Disp: 1 each, Rfl: 0   Blood Glucose Monitoring Suppl (FREESTYLE LITE) DEVI, Use to test blood sugar 3 times a day. Dx code e11.65, Disp: 1 each, Rfl: 3   diltiazem (CARDIZEM CD) 240 MG 24 hr capsule, TAKE 1 CAPSULE DAILY, Disp: 90 capsule, Rfl: 3   fish oil-omega-3 fatty acids 1000 MG capsule, Take 1 g by mouth daily., Disp: , Rfl:    FREESTYLE LITE test strip, TEST UP TO FOUR TIMES A DAY AS DIRECTED, Disp: 300 strip, Rfl: 3   furosemide (LASIX) 20 MG tablet, TAKE 1 TABLET TWICE A DAY, MAY TAKE AN EXTRA 20 MG (1 TABLET) AS NEEDED DAILY (PLEASE SCHEDULE AN APPOINTMENT FOR REFILLS), Disp: 270 tablet, Rfl: 3   hydrALAZINE (APRESOLINE) 50 MG tablet, TAKE 1 TABLET TWICE A DAY WITH FOOD, Disp: 180 tablet, Rfl: 3   JARDIANCE 10 MG TABS tablet, TAKE 1 TABLET DAILY BEFORE BREAKFAST, Disp: 90 tablet, Rfl: 3   Lancets (FREESTYLE) lancets, TEST UP TO FOUR TIMES A DAY AS DIRECTED, Disp: 300 each, Rfl: 3   loratadine (CLARITIN) 10 MG tablet, TAKE 1 TABLET DAILY, Disp: 90 tablet, Rfl: 3   metFORMIN (GLUCOPHAGE-XR) 750 MG 24 hr tablet, TAKE 1 TABLET DAILY, Disp: 90 tablet, Rfl: 3   metoprolol tartrate (LOPRESSOR) 100 MG tablet, TAKE 1 TABLET THREE TIMES A DAY, Disp: 270 tablet, Rfl: 3   Multiple Vitamin (MULITIVITAMIN WITH MINERALS) TABS, Take 1 tablet by mouth daily., Disp: , Rfl:    NON FORMULARY, at bedtime. CPAP, Disp: , Rfl:    pravastatin (PRAVACHOL) 80 MG tablet, TAKE 1 TABLET DAILY, Disp: 90 tablet, Rfl: 3   psyllium (REGULOID) 0.52 g capsule, Take 5 capsules by mouth daily., Disp: , Rfl:    TRULICITY 7.90 WI/0.9BD SOPN, INJECT 0.75 MG UNDER THE SKIN ONCE A WEEK, Disp: 6 mL, Rfl: 3   warfarin (COUMADIN) 4 MG tablet, TAKE ONE TO ONE AND ONE-HALF TABLETS DAILY AS DIRECTED PER COUMADIN CLINIC, Disp: 135 tablet, Rfl: 3   diphenhydramine-acetaminophen (TYLENOL PM) 25-500 MG TABS, Take 2 tablets by mouth at bedtime., Disp: , Rfl:    No Known Allergies    Review of Systems  Constitutional: Negative.   Respiratory: Negative.    Cardiovascular: Negative.  Negative for chest pain, palpitations and leg swelling.  Gastrointestinal: Negative.   Neurological:  Negative for dizziness and headaches.  Psychiatric/Behavioral: Negative.    All other systems reviewed and are negative.   Today's Vitals   10/12/20 1444  BP: 124/82  Pulse: 60  Temp: 98.4 F (36.9 C)  Weight: 297 lb 6.4 oz (134.9 kg)  Height: 5' 5"  (1.651 m)  PainSc: 0-No pain   Body mass index is 49.49 kg/m.  Wt Readings from Last 3 Encounters:  10/12/20 297 lb 6.4 oz (134.9 kg)  07/12/20 298 lb 3.2 oz (135.3 kg)  04/12/20 (!) 305 lb 3.2 oz (138.4 kg)    BP Readings from Last 3 Encounters:  10/12/20 124/82  07/12/20 124/78  04/12/20 132/78    Objective:  Physical Exam Vitals reviewed.  Constitutional:      General: He is not in acute  distress.    Appearance: Normal appearance. He is obese.     Comments: Central obesity  Cardiovascular:     Rate and Rhythm: Normal rate and regular rhythm.     Pulses: Normal pulses.     Heart sounds: Normal heart sounds. No murmur heard. Pulmonary:     Effort: No respiratory distress.     Breath sounds: Normal breath sounds. No wheezing.  Musculoskeletal:        General: No swelling.     Right lower leg: Edema (trace edema) present.     Left lower leg: Edema (more firm and 1+ edema) present.     Comments: Using a cane to ambulate. has a few cratered areas.   Skin:    General: Skin is warm and dry.     Capillary Refill: Capillary refill takes less than 2 seconds.     Findings: No rash.     Comments: Lower extremities are reddened with scaly skin, firm to touch  Neurological:     General: No focal deficit present.     Mental Status: He is alert and oriented to person, place, and time.     Cranial Nerves: No cranial nerve deficit.  Psychiatric:        Mood and Affect: Mood normal.        Behavior: Behavior normal.         Thought Content: Thought content normal.        Judgment: Judgment normal.        Assessment And Plan:     1. Type 2 diabetes mellitus without complication, without long-term current use of insulin (HCC) Comments: Blood sugars are improving, will check HgbA1c Continue current medications - Hemoglobin A1c - Ambulatory referral to Ophthalmology  2. Essential hypertension Comments: Well controlled, continue current medications - BMP8+EGFR  3. Class 3 obesity with alveolar hypoventilation and body mass index (BMI) of 45.0 to 49.9 in adult, unspecified whether serious comorbidity present [facility]) He is encouraged to initially strive for BMI less than 30 to decrease cardiac risk. He is advised to exercise no less than 150 minutes per week.    4. Encounter for screening colonoscopy - Ambulatory referral to Gastroenterology  5. Rash and nonspecific skin eruption Comments: Rx for cream sent to pharmacy for lower extremities - ammonium lactate (AMLACTIN) 12 % lotion; Apply 1 application topically as needed for dry skin.  Dispense: 400 g; Refill: 3  6. Bilateral leg edema Comments: Had Una boots done with Dermatology with good results    Patient was given opportunity to ask questions. Patient verbalized understanding of the plan and was able to repeat key elements of the plan. All questions were answered to their satisfaction.  Brett Brine, FNP   I, Brett Brine, FNP, have reviewed all documentation for this visit. The documentation on 10/12/20 for the exam, diagnosis, procedures, and orders are all accurate and complete.   IF YOU HAVE BEEN REFERRED TO A SPECIALIST, IT MAY TAKE 1-2 WEEKS TO SCHEDULE/PROCESS THE REFERRAL. IF YOU HAVE NOT HEARD FROM US/SPECIALIST IN TWO WEEKS, PLEASE GIVE [place] A CALL AT [phone] X [phone].   THE PATIENT IS ENCOURAGED TO PRACTICE SOCIAL DISTANCING DUE TO THE COVID-19 PANDEMIC.

## 2020-10-13 LAB — HEMOGLOBIN A1C
Est. average glucose Bld gHb Est-mCnc: 126 mg/dL
Hgb A1c MFr Bld: 6 % — ABNORMAL HIGH (ref 4.8–5.6)

## 2020-10-13 LAB — BMP8+EGFR
BUN/Creatinine Ratio: 18 (ref 10–24)
BUN: 22 mg/dL (ref 8–27)
CO2: 24 mmol/L (ref 20–29)
Calcium: 9.8 mg/dL (ref 8.6–10.2)
Chloride: 98 mmol/L (ref 96–106)
Creatinine, Ser: 1.2 mg/dL (ref 0.76–1.27)
Glucose: 94 mg/dL (ref 70–99)
Potassium: 4.4 mmol/L (ref 3.5–5.2)
Sodium: 140 mmol/L (ref 134–144)
eGFR: 63 mL/min/{1.73_m2} (ref >59)

## 2020-10-19 ENCOUNTER — Other Ambulatory Visit: Payer: Self-pay | Admitting: Nurse Practitioner

## 2020-11-01 ENCOUNTER — Encounter (HOSPITAL_BASED_OUTPATIENT_CLINIC_OR_DEPARTMENT_OTHER): Payer: Medicare Other | Attending: Internal Medicine | Admitting: Internal Medicine

## 2020-11-01 ENCOUNTER — Other Ambulatory Visit: Payer: Self-pay

## 2020-11-01 DIAGNOSIS — L97921 Non-pressure chronic ulcer of unspecified part of left lower leg limited to breakdown of skin: Secondary | ICD-10-CM | POA: Diagnosis not present

## 2020-11-01 DIAGNOSIS — I872 Venous insufficiency (chronic) (peripheral): Secondary | ICD-10-CM | POA: Diagnosis not present

## 2020-11-01 DIAGNOSIS — E11622 Type 2 diabetes mellitus with other skin ulcer: Secondary | ICD-10-CM | POA: Diagnosis not present

## 2020-11-01 DIAGNOSIS — L97811 Non-pressure chronic ulcer of other part of right lower leg limited to breakdown of skin: Secondary | ICD-10-CM

## 2020-11-01 DIAGNOSIS — Z7901 Long term (current) use of anticoagulants: Secondary | ICD-10-CM | POA: Diagnosis not present

## 2020-11-01 DIAGNOSIS — L97322 Non-pressure chronic ulcer of left ankle with fat layer exposed: Secondary | ICD-10-CM | POA: Diagnosis not present

## 2020-11-01 DIAGNOSIS — Z7984 Long term (current) use of oral hypoglycemic drugs: Secondary | ICD-10-CM | POA: Diagnosis not present

## 2020-11-01 DIAGNOSIS — E11621 Type 2 diabetes mellitus with foot ulcer: Secondary | ICD-10-CM | POA: Insufficient documentation

## 2020-11-01 DIAGNOSIS — I4819 Other persistent atrial fibrillation: Secondary | ICD-10-CM | POA: Insufficient documentation

## 2020-11-01 DIAGNOSIS — L97812 Non-pressure chronic ulcer of other part of right lower leg with fat layer exposed: Secondary | ICD-10-CM | POA: Insufficient documentation

## 2020-11-01 NOTE — Progress Notes (Signed)
[name], [name] ([id number]) Visit Report for 11/01/2020 Abuse/Suicide Risk Screen Details Patient Name: Date of Service: Brett [name] 11/01/2020 1:15 PM Medical Record Number: [id number] Patient Account Number: [id number] Date of Birth/Sex: Treating RN: [birth year] (75 y.o. Male) Brett [name] Primary Care Brett [name]: Brett [name] Other Clinician: Referring Brett [name]: Treating Brett [name]/Extender: Brett [name] in Treatment: 0 Abuse/Suicide Risk Screen Items Answer ABUSE RISK SCREEN: Has anyone close to you tried to hurt or harm you recentlyo No Do you feel uncomfortable with anyone in your familyo No Has anyone forced you do things that you didnt want to doo No Electronic Signature(s) Signed: 11/01/2020 5:29:18 PM By: Brett Pilling RN, BSN Entered By: Brett [name] on 11/01/2020 13:32:33 -------------------------------------------------------------------------------- Activities of Daily Living Details Patient Name: Date of Service: [name] 11/01/2020 1:15 PM Medical Record Number: [id number] Patient Account Number: [id number] Date of Birth/Sex: Treating RN: [birth year] (75 y.o. Male) Brett [name] Primary Care Brett [name]: Brett [name] Other Clinician: Referring Brett [name]: Treating Brett [name]/Extender: Brett [name] in Treatment: 0 Activities of Daily Living Items Answer Activities of Daily Living (Please select one for each item) Drive Automobile Completely Able T Medications ake Completely Able Use T elephone Completely Able Care for Appearance Completely Able Use T oilet Completely Able Bath / Shower Completely Able Dress Self Completely Able Feed Self Completely Able Walk Need Assistance Get In / Out Bed Completely [place] for Self Completely Able Electronic Signature(s) Signed: 11/01/2020 5:29:18 PM By: Brett Pilling RN, BSN Entered By:  Brett [name] on 11/01/2020 13:32:47 -------------------------------------------------------------------------------- Education Screening Details Patient Name: Date of Service: [name], [name] 11/01/2020 1:15 PM Medical Record Number: [id number] Patient Account Number: [id number] Date of Birth/Sex: Treating RN: 1945/10/01 (75 y.o. Male) Brett [name] Primary Care Brett [name]: Brett [name] Other Clinician: Referring Brett [name]: Treating Brett [name]/Extender: Brett [name] in Treatment: 0 Primary Learner Assessed: Patient Learning Preferences/Education Level/Primary Language Learning Preference: Explanation, Demonstration, Printed Material Highest Education Level: College or Above Preferred Language: English Cognitive Barrier Language Barrier: No Translator Needed: No Memory Deficit: No Emotional Barrier: No Cultural/Religious Beliefs Affecting Medical Care: No Physical Barrier Impaired Vision: No Impaired Hearing: No Decreased Hand dexterity: No Knowledge/Comprehension Knowledge Level: High Comprehension Level: High Ability to understand written instructions: High Ability to understand verbal instructions: High Motivation Anxiety Level: Calm Cooperation: Cooperative Education Importance: Acknowledges Need Interest in Health Problems: Asks Questions Perception: Coherent Willingness to Engage in Self-Management High Activities: Readiness to Engage in Self-Management High Activities: Electronic Signature(s) Signed: 11/01/2020 5:29:18 PM By: Brett Pilling RN, BSN Entered By: Brett [name] on 11/01/2020 13:33:22 -------------------------------------------------------------------------------- Fall Risk Assessment Details Patient Name: Date of Service: [name], [name] 11/01/2020 1:15 PM Medical Record Number: [id number] Patient Account Number: [id number] Date of Birth/Sex: Treating RN: April 03, 1945 (74 y.o. Male) Brett [name] Primary Care Brett [name]: Brett [name] Other Clinician: Referring Brett [name]: Treating Brett [name]/Extender: Brett [name] in Treatment: 0 Fall Risk Assessment Items Have you had 2 or more falls in the last 12 monthso 0 No Have you had any fall that resulted in injury in the last 12 monthso 0 No FALLS RISK SCREEN History of falling - immediate or within 3 months 0 No Secondary diagnosis (Do you have 2 or more medical diagnoseso) 0 No Ambulatory aid None/bed rest/wheelchair/nurse 0 No Crutches/cane/walker 15 Yes Furniture 0 No Intravenous therapy Access/Saline/Heparin Lock 0 No Gait/Transferring Normal/ bed rest/ wheelchair 0 Yes Weak (short steps  with or without shuffle, stooped but able to lift head while walking, may seek 0 No support from furniture) Impaired (short steps with shuffle, may have difficulty arising from chair, head down, impaired 0 No balance) Mental Status Oriented to own ability 0 Yes Electronic Signature(s) Signed: 11/01/2020 5:29:18 PM By: Brett Pilling RN, BSN Entered By: Brett [name] on 11/01/2020 13:33:34 -------------------------------------------------------------------------------- Foot Assessment Details Patient Name: Date of Service: [name], [name] 11/01/2020 1:15 PM Medical Record Number: [id number] Patient Account Number: [id number] Date of Birth/Sex: Treating RN: 22-Aug-1945 (74 y.o. Male) Brett [name] Primary Care Brett [name]: Brett [name] Other Clinician: Referring Leyda [name]: Treating Brett [name]/Extender: Brett [name] in Treatment: 0 Foot Assessment Items Site Locations + = Sensation present, - = Sensation absent, C = Callus, U = Ulcer R = Redness, W = Warmth, M = Maceration, PU = Pre-ulcerative lesion F = Fissure, S = Swelling, D = Dryness Assessment Right: Left: Other Deformity: No No Prior Foot Ulcer: No No Prior Amputation: No No Charcot Joint: No No Ambulatory Status: Ambulatory With Help Assistance Device: Cane Gait: Steady Electronic  Signature(s) Signed: 11/01/2020 5:29:18 PM By: Brett Pilling RN, BSN Entered By: Brett [name] on 11/01/2020 14:02:23 -------------------------------------------------------------------------------- Nutrition Risk Screening Details Patient Name: Date of Service: [facility], [name] 11/01/2020 1:15 PM Medical Record Number: [id number] Patient Account Number: [id number] Date of Birth/Sex: Treating RN: Aug 29, 1945 (74 y.o. Male) Brett [name] Primary Care Shawntavia Saunders: Brett [name] Other Clinician: Referring Timohty Renbarger: Treating Avni Traore/Extender: Brett [name] in Treatment: 0 Height (in): 71 Weight (lbs): 297 Body Mass Index (BMI): 41.4 Nutrition Risk Screening Items Score Screening NUTRITION RISK SCREEN: I have an illness or condition that made me change the kind and/or amount of food I eat 2 Yes I eat fewer than two meals per day 0 No I eat few fruits and vegetables, or milk products 0 No I have three or more drinks of beer, liquor or wine almost every day 0 No I have tooth or mouth problems that make it hard for me to eat 0 No I don't always have enough money to buy the food I need 0 No I eat alone most of the time 0 No I take three or more different prescribed or over-the-counter drugs a day 1 Yes Without wanting to, I have lost or gained 10 pounds in the last six months 0 No I am not always physically able to shop, cook and/or feed myself 0 No Nutrition Protocols Good Risk Protocol Moderate Risk Protocol 0 Provide education on nutrition High Risk Proctocol Risk Level: Moderate Risk Score: 3 Electronic Signature(s) Signed: 11/01/2020 5:29:18 PM By: Brett Pilling RN, BSN Entered By: Brett [name] on 11/01/2020 13:34:00

## 2020-11-01 NOTE — Progress Notes (Addendum)
Brett [name], Brett [name] ([id number]) Visit Report for 11/01/2020 Allergy List Details Patient Name: Date of Service: Brett [name] 11/01/2020 1:15 PM Medical Record Number: [id number] Patient Account Number: [id number] Date of Birth/Sex: Treating RN: Jul 13, 1945 (74 y.o. Male) Brett [name] Primary Care Brett [name]: Brett [name] Other Clinician: Referring Brett [name]: Treating Brett [name]/Extender: Brett [name] in Treatment: 0 Allergies Active Allergies No Known Drug Allergies Allergy Notes Electronic Signature(s) Signed: 11/01/2020 5:29:18 PM By: Brett Pilling RN, BSN Entered By: Brett [name] on 11/01/2020 13:31:17 -------------------------------------------------------------------------------- Arrival Information Details Patient Name: Date of Service: [name], [name] 11/01/2020 1:15 PM Medical Record Number: [id number] Patient Account Number: [id number] Date of Birth/Sex: Treating RN: 08-May-1945 (74 y.o. Male) Brett [name] Primary Care Brett [name]: Brett [name] Other Clinician: Referring Brett [name]: Treating Brett [name]/Extender: Brett [name] in Treatment: 0 Visit Information Patient Arrived: Kasandra Knudsen Arrival Time: 13:23 Accompanied By: self Transfer Assistance: None Patient Identification Verified: Yes Secondary Verification Process Completed: Yes Patient Requires Transmission-Based Precautions: No Patient Has Alerts: Yes Patient Alerts: Patient on Blood Thinner Electronic Signature(s) Signed: 11/01/2020 5:29:18 PM By: Brett Pilling RN, BSN Entered By: Brett [name] on 11/01/2020 13:29:46 -------------------------------------------------------------------------------- Clinic Level of Care Assessment Details Patient Name: Date of Service: [facility], [name] 11/01/2020 1:15 PM Medical Record Number: [id number] Patient Account Number: [id number] Date of Birth/Sex: Treating RN: 1945-05-11 (74 y.o. Male) Brett [name] Primary Care Louiza Moor: Brett [name]  Other Clinician: Referring Ayaka Andes: Treating Darcel Frane/Extender: Brett [name] in Treatment: 0 Clinic Level of Care Assessment Items TOOL 2 Quantity Score X- 1 0 Use when only an EandM is performed on the INITIAL visit ASSESSMENTS - Nursing Assessment / Reassessment X- 1 20 General Physical Exam (combine w/ comprehensive assessment (listed just below) when performed on new pt. evals) X- 1 25 Comprehensive Assessment (HX, ROS, Risk Assessments, Wounds Hx, etc.) ASSESSMENTS - Wound and Skin A ssessment / Reassessment _0  - 0 Simple Wound Assessment / Reassessment - one wound X- 3 5 Complex Wound Assessment / Reassessment - multiple wounds X- 1 10 Dermatologic / Skin Assessment (not related to wound area) ASSESSMENTS - Ostomy and/or Continence Assessment and Care _1  - 0 Incontinence Assessment and Management _2  - 0 Ostomy Care Assessment and Management (repouching, etc.) PROCESS - Coordination of Care _3  - 0 Simple Patient / Family Education for ongoing care X- 1 20 Complex (extensive) Patient / Family Education for ongoing care X- 1 10 Staff obtains Programmer, systems, Records, T Results / Process Orders est _4  - 0 Staff telephones HHA, Nursing Homes / Clarify orders / etc _5  - 0 Routine Transfer to another Facility (non-emergent condition) _6  - 0 Routine Hospital Admission (non-emergent condition) _7  - 0 New Admissions / Biomedical engineer / Ordering NPWT Apligraf, etc. , _8  - 0 Emergency Hospital Admission (emergent condition) _9  - 0 Simple Discharge Coordination X- 1 15 Complex (extensive) Discharge Coordination PROCESS - Special Needs _10  - 0 Pediatric / Minor Patient Management _11  - 0 Isolation Patient Management _12  - 0 Hearing / Language / Visual special needs _13  - 0 Assessment of Community assistance (transportation, D/C planning, etc.) _14  - 0 Additional assistance / Altered mentation _15  - 0 Support Surface(s) Assessment (bed, cushion, seat,  etc.) INTERVENTIONS - Wound Cleansing / Measurement X- 1 5 Wound Imaging (photographs - any number of wounds) _16  - 0 Wound Tracing (instead of photographs) _17  - 0 Simple Wound Measurement - one wound X- 3 5 Complex Wound Measurement - multiple wounds _18  - 0 Simple Wound Cleansing -  one wound X- 3 5 Complex Wound Cleansing - multiple wounds INTERVENTIONS - Wound Dressings _0  - 0 Small Wound Dressing one or multiple wounds _1  - 0 Medium Wound Dressing one or multiple wounds X- 2 20 Large Wound Dressing one or multiple wounds <[id number]>_3<\/[id number]>_2  - 0 Application of Medications - injection INTERVENTIONS - Miscellaneous _3  - 0 External ear exam _4  - 0 Specimen Collection (cultures, biopsies, blood, body fluids, etc.) _5  - 0 Specimen(s) / Culture(s) sent or taken to Lab for analysis _6  - 0 Patient Transfer (multiple staff / Harrel Lemon Lift / Similar devices) _7  - 0 Simple Staple / Suture removal (25 or less) _8  - 0 Complex Staple / Suture removal (26 or more) _9  - 0 Hypo / Hyperglycemic Management (close monitor of Blood Glucose) X- 1 15 Ankle / Brachial Index (ABI) - do not check if billed separately Has the patient been seen at the hospital within the last three years: Yes Total Score: 205 Level Of Care: New/Established - Level 5 Electronic Signature(s) Signed: 11/01/2020 5:29:18 PM By: Brett Pilling RN, BSN Entered By: Brett [name] on 11/01/2020 14:25:10 -------------------------------------------------------------------------------- Encounter Discharge Information Details Patient Name: Date of Service: [facility], [name] 11/01/2020 1:15 PM Medical Record Number: [id number] Patient Account Number: [id number] Date of Birth/Sex: Treating RN: 14-Nov-1945 (74 y.o. Male) Brett [name] Primary Care Maxi Carreras: Brett [name] Other Clinician: Referring Annmargaret Decaprio: Treating Sebrena Engh/Extender: Brett [name] in Treatment: 0 Encounter Discharge Information Items Post Procedure  Vitals Discharge Condition: Stable Temperature (F): 98.5 Ambulatory Status: Cane Pulse (bpm): 80 Discharge Destination: Home Respiratory Rate (breaths/min): 20 Transportation: Private Auto Blood Pressure (mmHg): 156/85 Accompanied By: self Schedule Follow-up Appointment: Yes Clinical Summary of Care: Electronic Signature(s) Signed: 11/01/2020 5:29:18 PM By: Brett Pilling RN, BSN Entered By: Brett [name] on 11/01/2020 14:42:38 -------------------------------------------------------------------------------- Lower Extremity Assessment Details Patient Name: Date of Service: [facility], [name] 11/01/2020 1:15 PM Medical Record Number: [id number] Patient Account Number: [id number] Date of Birth/Sex: Treating RN: December 14, 1945 (74 y.o. Male) Brett [name] Primary Care Kristoff Coonradt: Brett [name] Other Clinician: Referring Madalaine Portier: Treating Zoi Devine/Extender: Brett [name] in Treatment: 0 Edema Assessment Assessed: [Left: Yes] [Right: Yes] Edema: [Left: Yes] [Right: Yes] Calf Left: Right: Point of Measurement: 33 cm From Medial Instep 49.3 cm 43.2 cm Ankle Left: Right: Point of Measurement: 11 cm From Medial Instep 27.4 cm 26 cm Knee To Floor Left: Right: From Medial Instep 45 cm 45 cm Vascular Assessment Pulses: Dorsalis Pedis Palpable: [Left:Yes] [Right:Yes] Doppler Audible: [Left:Yes] [Right:Yes] Posterior Tibial Palpable: [Left:Yes] [Right:Yes] Doppler Audible: [Left:Inaudible] [Right:Yes] Blood Pressure: Brachial: [Right:156] Ankle: [Left:Dorsalis Pedis: 188 1.21] [Right:Dorsalis Pedis: 208 1.33] Electronic Signature(s) Signed: 11/01/2020 5:29:18 PM By: Brett Pilling RN, BSN Entered By: Brett [name] on 11/01/2020 13:54:29 -------------------------------------------------------------------------------- Multi Wound Chart Details Patient Name: Date of Service: [facility], [name] 11/01/2020 1:15 PM Medical Record Number: [id number] Patient Account  Number: [id number] Date of Birth/Sex: Treating RN: [birth year] (74 y.o. Male) Brett [name] Primary Care Magdaleno Lortie: Brett [name] Other Clinician: Referring Evanie Buckle: Treating Tinzley Dalia/Extender: Kalman Shan Weeks in Treatment: 0 Vital Signs Height(in): 71 Pulse(bpm): 80 Weight(lbs): 297 Blood Pressure(mmHg): 156/85 Body Mass Index(BMI): 41 Temperature(F): 98.5 Respiratory Rate(breaths/min): 20 Photos: Right, Lateral Lower Leg Right, Anterior Lower Leg Left, Medial Malleolus Wound Location: Gradually Appeared Gradually Appeared Gradually Appeared Wounding Event: Venous Leg Ulcer Venous Leg Ulcer Venous Leg Ulcer Primary Etiology: Lymphedema Lymphedema Lymphedema Secondary Etiology: Sleep Apnea, Arrhythmia, Deep Vein Sleep Apnea, Arrhythmia, Deep Vein Sleep Apnea, Arrhythmia, Deep Vein Comorbid History:  Thrombosis, Hypertension Thrombosis, Hypertension Thrombosis, Hypertension 11/02/2019 11/02/2019 11/02/2019 Date Acquired: 0 0 0 Weeks of Treatment: Open Open Open Wound Status: 1.2x1.1x0.1 0.4x0.5x0.2 0.5x0.3x0.1 Measurements L x W x D (cm) 1.037 0.157 0.118 A (cm) : rea 0.104 0.031 0.012 Volume (cm) : 0.00% 0.00% 0.00% % Reduction in A rea: 0.00% 0.00% 0.00% % Reduction in Volume: Full Thickness Without Exposed Full Thickness Without Exposed Full Thickness Without Exposed Classification: Support Structures Support Structures Support Structures Medium Medium Medium Exudate A mount: Serosanguineous Serosanguineous Serosanguineous Exudate Type: red, brown red, brown red, brown Exudate Color: Distinct, outline attached Distinct, outline attached Distinct, outline attached Wound Margin: Large (67-100%) Large (67-100%) Large (67-100%) Granulation A mount: Red Red Pink, Pale Granulation Quality: None Present (0%) None Present (0%) None Present (0%) Necrotic A mount: Fat Layer (Subcutaneous Tissue): Yes Fat Layer (Subcutaneous Tissue): Yes Fat Layer  (Subcutaneous Tissue): Yes Exposed Structures: Fascia: No Fascia: No Fascia: No Tendon: No Tendon: No Tendon: No Muscle: No Muscle: No Muscle: No Joint: No Joint: No Joint: No Bone: No Bone: No Bone: No None None Small (1-33%) Epithelialization: Chemical/Enzymatic/Mechanical N/A Chemical/Enzymatic/Mechanical Debridement: N/A N/A N/A Instrument: None N/A None Bleeding: Debridement Treatment Response: Procedure was tolerated well N/A Procedure was tolerated well Post Debridement Measurements L x 1.2x1.1x0.1 N/A 0.5x0.3x0.1 W x D (cm) 0.104 N/A 0.012 Post Debridement Volume: (cm) Debridement N/A Debridement Procedures Performed: Treatment Notes Wound #1 (Lower Leg) Wound Laterality: Right, Lateral Cleanser Wound Cleanser Discharge Instruction: Cleanse the wound with wound cleanser prior to applying a clean dressing using gauze sponges, not tissue or cotton balls. Peri-Wound Care Triamcinolone 15 (g) Discharge Instruction: Use triamcinolone 15 (g) as directed Sween Lotion (Moisturizing lotion) Discharge Instruction: Apply moisturizing lotion as directed Topical Primary Dressing Hydrofera Blue Ready Foam, 2.5 x2.5 in Discharge Instruction: Apply to wound bed as instructed Secondary Dressing Woven Gauze Sponge, Non-Sterile 4x4 in Discharge Instruction: Apply over primary dressing as directed. ABD Pad, 8x10 Discharge Instruction: Apply over primary dressing as directed. Secured With Compression Wrap Kerlix Roll 4.5x3.1 (in/yd) Discharge Instruction: Apply Kerlix and Coban compression as directed. Coban Self-Adherent Wrap 4x5 (in/yd) Discharge Instruction: Apply over Kerlix as directed. Compression Stockings Circaid Juxta Lite Compression Wrap Quantity: 1 Right Leg Compression Amount: 30-40 mmHg Discharge Instruction: Apply Circaid Juxta Lite Compression Wrap daily as instructed. Apply first thing in the morning, remove at night before bed. Add-Ons Wound #2  (Lower Leg) Wound Laterality: Right, Anterior Cleanser Wound Cleanser Discharge Instruction: Cleanse the wound with wound cleanser prior to applying a clean dressing using gauze sponges, not tissue or cotton balls. Peri-Wound Care Triamcinolone 15 (g) Discharge Instruction: Use triamcinolone 15 (g) as directed Sween Lotion (Moisturizing lotion) Discharge Instruction: Apply moisturizing lotion as directed Topical Primary Dressing Hydrofera Blue Ready Foam, 2.5 x2.5 in Discharge Instruction: Apply to wound bed as instructed Secondary Dressing Woven Gauze Sponge, Non-Sterile 4x4 in Discharge Instruction: Apply over primary dressing as directed. ABD Pad, 8x10 Discharge Instruction: Apply over primary dressing as directed. Secured With Compression Wrap Kerlix Roll 4.5x3.1 (in/yd) Discharge Instruction: Apply Kerlix and Coban compression as directed. Coban Self-Adherent Wrap 4x5 (in/yd) Discharge Instruction: Apply over Kerlix as directed. Compression Stockings Add-Ons Wound #3 (Malleolus) Wound Laterality: Left, Medial Cleanser Wound Cleanser Discharge Instruction: Cleanse the wound with wound cleanser prior to applying a clean dressing using gauze sponges, not tissue or cotton balls. Peri-Wound Care Triamcinolone 15 (g) Discharge Instruction: Use triamcinolone 15 (g) as directed Sween Lotion (Moisturizing lotion) Discharge Instruction: Apply moisturizing lotion as directed Topical  Primary Dressing Hydrofera Blue Ready Foam, 2.5 x2.5 in Discharge Instruction: Apply to wound bed as instructed Secondary Dressing Woven Gauze Sponge, Non-Sterile 4x4 in Discharge Instruction: Apply over primary dressing as directed. ABD Pad, 8x10 Discharge Instruction: Apply over primary dressing as directed. Secured With Compression Wrap Kerlix Roll 4.5x3.1 (in/yd) Discharge Instruction: Apply Kerlix and Coban compression as directed. Coban Self-Adherent Wrap 4x5 (in/yd) Discharge Instruction:  Apply over Kerlix as directed. Compression Stockings Circaid Juxta Lite Compression Wrap Quantity: 1 Left Leg Compression Amount: 30-40 mmHg Discharge Instruction: Apply Circaid Juxta Lite Compression Wrap daily as instructed. Apply first thing in the morning, remove at night before bed. Add-Ons Electronic Signature(s) Signed: 11/07/2020 12:19:17 PM By: Kalman Shan DO Signed: 11/07/2020 5:50:12 PM By: Brett Pilling RN, BSN Previous Signature: 11/01/2020 3:07:11 PM Version By: Kalman Shan DO Previous Signature: 11/01/2020 5:29:18 PM Version By: Brett Pilling RN, BSN Entered By: Kalman Shan on 11/07/2020 12:11:54 -------------------------------------------------------------------------------- Multi-Disciplinary Care Plan Details Patient Name: Date of Service: [facility], [name] 11/01/2020 1:15 PM Medical Record Number: [id number] Patient Account Number: [id number] Date of Birth/Sex: Treating RN: Jul 09, 1945 (75 y.o. Male) Brett [name] Primary Care Shaquia Berkley: Brett [name] Other Clinician: Referring Skyy Mcknight: Treating Lynniah Janoski/Extender: Brett [name] in Treatment: 0 Active Inactive Nutrition Nursing Diagnoses: Potential for alteratiion in Nutrition/Potential for imbalanced nutrition Goals: Patient/caregiver agrees to and verbalizes understanding of need to obtain nutritional consultation Date Initiated: 11/01/2020 Target Resolution Date: 11/10/2020 Goal Status: Active Interventions: Provide education on nutrition Notes: Orientation to the Wound Care Program Nursing Diagnoses: Knowledge deficit related to the wound healing center program Goals: Patient/caregiver will verbalize understanding of the [place] Program Date Initiated: 11/01/2020 Target Resolution Date: 11/10/2020 Goal Status: Active Interventions: Provide education on orientation to the wound center Notes: Pain, Acute or Chronic Nursing Diagnoses: Pain, acute or chronic:  actual or potential Potential alteration in comfort, pain Goals: Patient will verbalize adequate pain control and receive pain control interventions during procedures as needed Date Initiated: 11/01/2020 Target Resolution Date: 11/11/2020 Goal Status: Active Patient/caregiver will verbalize comfort level met Date Initiated: 11/01/2020 Target Resolution Date: 11/04/2020 Goal Status: Active Interventions: Encourage patient to take pain medications as prescribed Provide education on pain management Reposition patient for comfort Treatment Activities: Administer pain control measures as ordered : 11/01/2020 Notes: Wound/Skin Impairment Nursing Diagnoses: Knowledge deficit related to ulceration/compromised skin integrity Goals: Patient/caregiver will verbalize understanding of skin care regimen Date Initiated: 11/01/2020 Target Resolution Date: 11/10/2020 Goal Status: Active Interventions: Assess patient/caregiver ability to perform ulcer/skin care regimen upon admission and as needed Assess ulceration(s) every visit Provide education on ulcer and skin care Treatment Activities: Skin care regimen initiated : 11/01/2020 Topical wound management initiated : 11/01/2020 Notes: Electronic Signature(s) Signed: 11/01/2020 5:29:18 PM By: Brett Pilling RN, BSN Entered By: Brett [name] on 11/01/2020 14:13:10 -------------------------------------------------------------------------------- Pain Assessment Details Patient Name: Date of Service: [facility], [name] 11/01/2020 1:15 PM Medical Record Number: [id number] Patient Account Number: [id number] Date of Birth/Sex: Treating RN: Mar 12, 1945 (74 y.o. Male) Brett [name] Primary Care Khyleigh Furney: Brett [name] Other Clinician: Referring Melva Faux: Treating Keyanna Sandefer/Extender: Brett [name] in Treatment: 0 Active Problems Location of Pain Severity and Description of Pain Patient Has Paino No Site Locations Rate the  pain. Current Pain Level: 0 Pain Management and Medication Current Pain Management: Medication: No Cold Application: No Rest: No Massage: No Activity: No T.E.N.S.: No Heat Application: No Leg drop or elevation: No Is the Current Pain Management Adequate: Adequate How does your wound impact  your activities of daily livingo Sleep: No Bathing: No Appetite: No Relationship With Others: No Bladder Continence: No Emotions: No Bowel Continence: No Work: No Toileting: No Drive: No Dressing: No Hobbies: No Engineer, maintenance) Signed: 11/01/2020 5:29:18 PM By: Brett Pilling RN, BSN Entered By: Brett [name] on 11/01/2020 13:34:22 -------------------------------------------------------------------------------- Patient/Caregiver Education Details Patient Name: Date of Service: [facility] 10/25/2022andnbsp1:15 PM Medical Record Number: [id number] Patient Account Number: [id number] Date of Birth/Gender: Treating RN: May 19, 1945 (75 y.o. Male) Brett [name] Primary Care Physician: Brett [name] Other Clinician: Referring Physician: Treating Physician/Extender: Brett [name] in Treatment: 0 Education Assessment Education Provided To: Patient Education Topics Provided Venous: Handouts: Controlling Swelling with Multilayered Compression Wraps Methods: Explain/Verbal, Printed Responses: Reinforcements needed [place]: o Handouts: Welcome T [facility] [place] o Methods: Explain/Verbal, Printed Responses: Reinforcements needed Electronic Signature(s) Signed: 11/01/2020 5:29:18 PM By: Brett Pilling RN, BSN Entered By: Brett [name] on 11/01/2020 14:13:29 -------------------------------------------------------------------------------- Wound Assessment Details Patient Name: Date of Service: [facility], [name] 11/01/2020 1:15 PM Medical Record Number: [id number] Patient Account Number: [id number] Date of Birth/Sex: Treating  RN: Jun 10, 1945 (74 y.o. Male) Brett [name] Primary Care Kirra Verga: Brett [name] Other Clinician: Referring Jamicah Anstead: Treating Boss Danielsen/Extender: Brett [name] in Treatment: 0 Wound Status Wound Number: 1 Primary Etiology: Venous Leg Ulcer Wound Location: Right, Lateral Lower Leg Secondary Lymphedema Etiology: Wounding Event: Gradually Appeared Wound Status: Open Date Acquired: 11/02/2019 Comorbid History: Sleep Apnea, Arrhythmia, Deep Vein Thrombosis, Weeks Of Treatment: 0 Hypertension Clustered Wound: No Photos Wound Measurements Length: (cm) 1.2 Width: (cm) 1.1 Depth: (cm) 0.1 Area: (cm) 1.037 Volume: (cm) 0.104 % Reduction in Area: 0% % Reduction in Volume: 0% Epithelialization: None Tunneling: No Undermining: No Wound Description Classification: Full Thickness Without Exposed Support Structures Wound Margin: Distinct, outline attached Exudate Amount: Medium Exudate Type: Serosanguineous Exudate Color: red, brown Foul Odor After Cleansing: No Slough/Fibrino No Wound Bed Granulation Amount: Large (67-100%) Exposed Structure Granulation Quality: Red Fascia Exposed: No Necrotic Amount: None Present (0%) Fat Layer (Subcutaneous Tissue) Exposed: Yes Tendon Exposed: No Muscle Exposed: No Joint Exposed: No Bone Exposed: No Treatment Notes Wound #1 (Lower Leg) Wound Laterality: Right, Lateral Cleanser Wound Cleanser Discharge Instruction: Cleanse the wound with wound cleanser prior to applying a clean dressing using gauze sponges, not tissue or cotton balls. Peri-Wound Care Triamcinolone 15 (g) Discharge Instruction: Use triamcinolone 15 (g) as directed Sween Lotion (Moisturizing lotion) Discharge Instruction: Apply moisturizing lotion as directed Topical Primary Dressing Hydrofera Blue Ready Foam, 2.5 x2.5 in Discharge Instruction: Apply to wound bed as instructed Secondary Dressing Woven Gauze Sponge, Non-Sterile 4x4 in Discharge Instruction:  Apply over primary dressing as directed. ABD Pad, 8x10 Discharge Instruction: Apply over primary dressing as directed. Secured With Compression Wrap Kerlix Roll 4.5x3.1 (in/yd) Discharge Instruction: Apply Kerlix and Coban compression as directed. Coban Self-Adherent Wrap 4x5 (in/yd) Discharge Instruction: Apply over Kerlix as directed. Compression Stockings Circaid Juxta Lite Compression Wrap Quantity: 1 Right Leg Compression Amount: 30-40 mmHg Discharge Instruction: Apply Circaid Juxta Lite Compression Wrap daily as instructed. Apply first thing in the morning, remove at night before bed. Add-Ons Electronic Signature(s) Signed: 11/01/2020 5:29:18 PM By: Brett Pilling RN, BSN Entered By: Brett [name] on 11/01/2020 14:00:38 -------------------------------------------------------------------------------- Wound Assessment Details Patient Name: Date of Service: [facility], [name] 11/01/2020 1:15 PM Medical Record Number: [id number] Patient Account Number: [id number] Date of Birth/Sex: Treating RN: 1945-02-19 (75 y.o. Male) Brett [name] Primary Care Berkley Wrightsman: Brett [name]  Other Clinician: Referring Zandrea Kenealy: Treating Naydene Kamrowski/Extender: Brett [name] in Treatment: 0 Wound Status Wound Number: 2 Primary Etiology: Venous Leg Ulcer Wound Location: Right, Anterior Lower Leg Secondary Lymphedema Etiology: Wounding Event: Gradually Appeared Wound Status: Open Date Acquired: 11/02/2019 Comorbid History: Sleep Apnea, Arrhythmia, Deep Vein Thrombosis, Weeks Of Treatment: 0 Hypertension Clustered Wound: No Photos Wound Measurements Length: (cm) 0.4 Width: (cm) 0.5 Depth: (cm) 0.2 Area: (cm) 0.157 Volume: (cm) 0.031 % Reduction in Area: 0% % Reduction in Volume: 0% Epithelialization: None Tunneling: No Undermining: No Wound Description Classification: Full Thickness Without Exposed Support Structures Wound Margin: Distinct, outline attached Exudate Amount:  Medium Exudate Type: Serosanguineous Exudate Color: red, brown Foul Odor After Cleansing: No Slough/Fibrino No Wound Bed Granulation Amount: Large (67-100%) Exposed Structure Granulation Quality: Red Fascia Exposed: No Necrotic Amount: None Present (0%) Fat Layer (Subcutaneous Tissue) Exposed: Yes Tendon Exposed: No Muscle Exposed: No Joint Exposed: No Bone Exposed: No Treatment Notes Wound #2 (Lower Leg) Wound Laterality: Right, Anterior Cleanser Wound Cleanser Discharge Instruction: Cleanse the wound with wound cleanser prior to applying a clean dressing using gauze sponges, not tissue or cotton balls. Peri-Wound Care Triamcinolone 15 (g) Discharge Instruction: Use triamcinolone 15 (g) as directed Sween Lotion (Moisturizing lotion) Discharge Instruction: Apply moisturizing lotion as directed Topical Primary Dressing Hydrofera Blue Ready Foam, 2.5 x2.5 in Discharge Instruction: Apply to wound bed as instructed Secondary Dressing Woven Gauze Sponge, Non-Sterile 4x4 in Discharge Instruction: Apply over primary dressing as directed. ABD Pad, 8x10 Discharge Instruction: Apply over primary dressing as directed. Secured With Compression Wrap Kerlix Roll 4.5x3.1 (in/yd) Discharge Instruction: Apply Kerlix and Coban compression as directed. Coban Self-Adherent Wrap 4x5 (in/yd) Discharge Instruction: Apply over Kerlix as directed. Compression Stockings Add-Ons Electronic Signature(s) Signed: 11/01/2020 5:29:18 PM By: Brett Pilling RN, BSN Entered By: Brett [name] on 11/01/2020 14:02:15 -------------------------------------------------------------------------------- Wound Assessment Details Patient Name: Date of Service: [facility], [name] 11/01/2020 1:15 PM Medical Record Number: [id number] Patient Account Number: [id number] Date of Birth/Sex: Treating RN: 1945/09/03 (74 y.o. Male) Brett [name] Primary Care Solene Hereford: Brett [name] Other Clinician: Referring  Tira Lafferty: Treating Camrin Gearheart/Extender: Brett [name] in Treatment: 0 Wound Status Wound Number: 3 Primary Etiology: Venous Leg Ulcer Wound Location: Left, Medial Malleolus Secondary Lymphedema Etiology: Wounding Event: Gradually Appeared Wound Status: Open Date Acquired: 11/02/2019 Comorbid History: Sleep Apnea, Arrhythmia, Deep Vein Thrombosis, Weeks Of Treatment: 0 Hypertension Clustered Wound: No Photos Wound Measurements Length: (cm) 0.5 Width: (cm) 0.3 Depth: (cm) 0.1 Area: (cm) 0.118 Volume: (cm) 0.012 % Reduction in Area: 0% % Reduction in Volume: 0% Epithelialization: Small (1-33%) Tunneling: No Undermining: No Wound Description Classification: Full Thickness Without Exposed Support Structures Wound Margin: Distinct, outline attached Exudate Amount: Medium Exudate Type: Serosanguineous Exudate Color: red, brown Foul Odor After Cleansing: No Slough/Fibrino No Wound Bed Granulation Amount: Large (67-100%) Exposed Structure Granulation Quality: Pink, Pale Fascia Exposed: No Necrotic Amount: None Present (0%) Fat Layer (Subcutaneous Tissue) Exposed: Yes Tendon Exposed: No Muscle Exposed: No Joint Exposed: No Bone Exposed: No Treatment Notes Wound #3 (Malleolus) Wound Laterality: Left, Medial Cleanser Wound Cleanser Discharge Instruction: Cleanse the wound with wound cleanser prior to applying a clean dressing using gauze sponges, not tissue or cotton balls. Peri-Wound Care Triamcinolone 15 (g) Discharge Instruction: Use triamcinolone 15 (g) as directed Sween Lotion (Moisturizing lotion) Discharge Instruction: Apply moisturizing lotion as directed Topical Primary Dressing Hydrofera Blue Ready Foam, 2.5 x2.5 in Discharge Instruction: Apply to wound bed as instructed Secondary Dressing Woven Gauze Sponge, Non-Sterile 4x4  in Discharge Instruction: Apply over primary dressing as directed. ABD Pad, 8x10 Discharge Instruction: Apply over primary  dressing as directed. Secured With Compression Wrap Kerlix Roll 4.5x3.1 (in/yd) Discharge Instruction: Apply Kerlix and Coban compression as directed. Coban Self-Adherent Wrap 4x5 (in/yd) Discharge Instruction: Apply over Kerlix as directed. Compression Stockings Circaid Juxta Lite Compression Wrap Quantity: 1 Left Leg Compression Amount: 30-40 mmHg Discharge Instruction: Apply Circaid Juxta Lite Compression Wrap daily as instructed. Apply first thing in the morning, remove at night before bed. Add-Ons Electronic Signature(s) Signed: 11/01/2020 5:29:18 PM By: Brett Pilling RN, BSN Entered By: Brett [name] on 11/01/2020 14:03:31 -------------------------------------------------------------------------------- Vitals Details Patient Name: Date of Service: [facility], [name] 11/01/2020 1:15 PM Medical Record Number: [id number] Patient Account Number: [id number] Date of Birth/Sex: Treating RN: 1945-03-11 (74 y.o. Male) Brett [name] Primary Care Jamir Rone: Brett [name] Other Clinician: Referring Lynton Crescenzo: Treating Lamonta Cypress/Extender: Brett [name] in Treatment: 0 Vital Signs Time Taken: 13:25 Temperature (F): 98.5 Height (in): 71 Pulse (bpm): 80 Source: Stated Respiratory Rate (breaths/min): 20 Weight (lbs): 297 Blood Pressure (mmHg): 156/85 Source: Stated Reference Range: 80 - 120 mg / dl Body Mass Index (BMI): 41.4 Electronic Signature(s) Signed: 11/01/2020 5:29:18 PM By: Brett Pilling RN, BSN Entered By: Brett [name] on 11/01/2020 13:31:05

## 2020-11-02 ENCOUNTER — Ambulatory Visit (INDEPENDENT_AMBULATORY_CARE_PROVIDER_SITE_OTHER): Payer: Medicare Other

## 2020-11-02 DIAGNOSIS — Z7901 Long term (current) use of anticoagulants: Secondary | ICD-10-CM

## 2020-11-02 DIAGNOSIS — Z5181 Encounter for therapeutic drug level monitoring: Secondary | ICD-10-CM | POA: Diagnosis not present

## 2020-11-02 DIAGNOSIS — I4819 Other persistent atrial fibrillation: Secondary | ICD-10-CM

## 2020-11-02 LAB — POCT INR: INR: 2.5 (ref 2.0–3.0)

## 2020-11-02 NOTE — Patient Instructions (Signed)
Continue taking 1.5 tablets daily except 1 tablet on Sunday.  Repeat INR in 3 weeks.  [phone]

## 2020-11-07 NOTE — Progress Notes (Signed)
BEREN, YNIGUEZ ([id number]) Visit Report for 11/01/2020 Chief Complaint Document Details Patient Name: Date of Service: [name] 11/01/2020 1:15 PM Medical Record Number: [id number] Patient Account Number: [id number] Date of Birth/Sex: Treating RN: 1945-11-08 (75 y.o. Male) Deon Pilling Primary Care Provider: [name] Other Clinician: Referring Provider: Treating Provider/Extender: Yaakov Guthrie in Treatment: 0 Information Obtained from: Patient Chief Complaint Bilateral lower extremity wounds Electronic Signature(s) Signed: 11/07/2020 12:19:17 PM By: Kalman Shan DO Previous Signature: 11/01/2020 3:07:11 PM Version By: Kalman Shan DO Entered By: Kalman Shan on 11/07/2020 12:12:02 -------------------------------------------------------------------------------- Debridement Details Patient Name: Date of Service: [facility], [name] 11/01/2020 1:15 PM Medical Record Number: [id number] Patient Account Number: [id number] Date of Birth/Sex: Treating RN: August 11, 1945 (75 y.o. Male) Deon Pilling Primary Care Provider: [name] Other Clinician: Referring Provider: Treating Provider/Extender: Yaakov Guthrie in Treatment: 0 Debridement Performed for Assessment: Wound #1 Right,Lateral Lower Leg Performed By: Clinician Deon Pilling, RN Debridement Type: Chemical/Enzymatic/Mechanical Agent Used: gauze and wound cleanser Severity of Tissue Pre Debridement: Limited to breakdown of skin Level of Consciousness (Pre-procedure): Awake and Alert Pre-procedure Verification/Time Out No Taken: Bleeding: None Response to Treatment: Procedure was tolerated well Level of Consciousness (Post- Awake and Alert procedure): Post Debridement Measurements of Total Wound Length: (cm) 1.2 Width: (cm) 1.1 Depth: (cm) 0.1 Volume: (cm) 0.104 Character of Wound/Ulcer Post Debridement: Stable Severity of Tissue Post Debridement: Limited to  breakdown of skin Post Procedure Diagnosis Same as Pre-procedure Electronic Signature(s) Signed: 11/01/2020 3:07:11 PM By: Kalman Shan DO Signed: 11/01/2020 5:29:18 PM By: Deon Pilling RN, BSN Signed: 11/01/2020 5:29:18 PM By: Deon Pilling RN, BSN Entered By: Deon Pilling on 11/01/2020 14:16:39 -------------------------------------------------------------------------------- Debridement Details Patient Name: Date of Service: [facility], [name] 11/01/2020 1:15 PM Medical Record Number: [id number] Patient Account Number: [id number] Date of Birth/Sex: Treating RN: October 28, 1945 (75 y.o. Male) Deon Pilling Primary Care Provider: [name] Other Clinician: Referring Provider: Treating Provider/Extender: Yaakov Guthrie in Treatment: 0 Debridement Performed for Assessment: Wound #3 Left,Medial Malleolus Performed By: Clinician Deon Pilling, RN Debridement Type: Chemical/Enzymatic/Mechanical Agent Used: gauze and wound cleanser Severity of Tissue Pre Debridement: Limited to breakdown of skin Level of Consciousness (Pre-procedure): Awake and Alert Pre-procedure Verification/Time Out No Taken: Bleeding: None Response to Treatment: Procedure was tolerated well Level of Consciousness (Post- Awake and Alert procedure): Post Debridement Measurements of Total Wound Length: (cm) 0.5 Width: (cm) 0.3 Depth: (cm) 0.1 Volume: (cm) 0.012 Character of Wound/Ulcer Post Debridement: Stable Severity of Tissue Post Debridement: Limited to breakdown of skin Post Procedure Diagnosis Same as Pre-procedure Electronic Signature(s) Signed: 11/01/2020 3:07:11 PM By: Kalman Shan DO Signed: 11/01/2020 5:29:18 PM By: Deon Pilling RN, BSN Entered By: Deon Pilling on 11/01/2020 14:17:03 -------------------------------------------------------------------------------- HPI Details Patient Name: Date of Service: [facility], [name] 11/01/2020 1:15 PM Medical Record Number:  [id number] Patient Account Number: [id number] Date of Birth/Sex: Treating RN: May 30, 1945 (75 y.o. Male) Deon Pilling Primary Care Provider: [name] Other Clinician: Referring Provider: Treating Provider/Extender: Yaakov Guthrie in Treatment: 0 History of Present Illness HPI Description: Admission 11/01/2020 Mr. Jhalil Silvera is a 75 year old male with a past medical history of controlled type 2 diabetes on oral medications, chronic venous insufficiency and persistent A. fib on Coumadin that presents to the clinic for A 1 year history of waxing and waning of bilateral lower extremity wounds. He states that his wounds opened up spontaneously. They have improved over the past 6 months. He was  doing [employer] in August and September which helped heal his wounds. They have opened up again and he is using lotion and compression stockings. He currently denies signs of infection. Electronic Signature(s) Signed: 11/07/2020 12:19:17 PM By: Kalman Shan DO Previous Signature: 11/01/2020 3:07:11 PM Version By: Kalman Shan DO Entered By: Kalman Shan on 11/07/2020 12:12:07 -------------------------------------------------------------------------------- Physical Exam Details Patient Name: Date of Service: [facility], [name] 11/01/2020 1:15 PM Medical Record Number: [id number] Patient Account Number: [id number] Date of Birth/Sex: Treating RN: 1945-06-20 (75 y.o. Male) Deon Pilling Primary Care Provider: [name] Other Clinician: Referring Provider: Treating Provider/Extender: Yaakov Guthrie in Treatment: 0 Constitutional respirations regular, non-labored and within target range for patient.. Cardiovascular 2+ dorsalis pedis/posterior tibialis pulses. Psychiatric pleasant and cooperative. Notes Small open wounds with granulation tissue to lower extremities bilaterally. No obvious signs of surrounding infection. No drainage noted. Venous  stasis dermatitis bilaterally Electronic Signature(s) Signed: 11/07/2020 12:19:17 PM By: Kalman Shan DO Entered By: Kalman Shan on 11/07/2020 12:13:11 -------------------------------------------------------------------------------- Physician Orders Details Patient Name: Date of Service: [facility], [name] 11/01/2020 1:15 PM Medical Record Number: [id number] Patient Account Number: [id number] Date of Birth/Sex: Treating RN: 11-Feb-1945 (75 y.o. Male) Deon Pilling Primary Care Provider: [name] Other Clinician: Referring Provider: Treating Provider/Extender: Yaakov Guthrie in Treatment: 0 Verbal / Phone Orders: No Diagnosis Coding ICD-10 Coding Code Description L97.811 Non-pressure chronic ulcer of other part of right lower leg limited to breakdown of skin L97.921 Non-pressure chronic ulcer of unspecified part of left lower leg limited to breakdown of skin I87.2 Venous insufficiency (chronic) (peripheral) I89.0 Lymphedema, not elsewhere classified E11.9 Type 2 diabetes mellitus without complications L46.50 Other persistent atrial fibrillation Z79.01 Long term (current) use of anticoagulants Follow-up Appointments ppointment in 1 week. - Dr. Heber Cheyenne Return A [employer] company will be sending you compression garments juxalites HD Someone will call you with an appt time with [facility] for arterial studies. Bathing/ Shower/ Hygiene May shower with protection but do not get wound dressing(s) wet. Edema Control - Lymphedema / SCD / Other Elevate legs to the level of the heart or above for 30 minutes daily and/or when sitting, a frequency of: - 3-4 times a day throughout the day. Avoid standing for long periods of time. Exercise regularly Compression stocking or Garment 30-40 mm/Hg pressure to: - Bring into next appt time- juxtalite HD. Wound Treatment Wound #1 - Lower Leg Wound Laterality: Right, Lateral Cleanser: Wound Cleanser 1 x Per Week/30  Days Discharge Instructions: Cleanse the wound with wound cleanser prior to applying a clean dressing using gauze sponges, not tissue or cotton balls. Peri-Wound Care: Triamcinolone 15 (g) 1 x Per Week/30 Days Discharge Instructions: Use triamcinolone 15 (g) as directed Peri-Wound Care: Sween Lotion (Moisturizing lotion) 1 x Per Week/30 Days Discharge Instructions: Apply moisturizing lotion as directed Prim Dressing: Hydrofera Blue Ready Foam, 2.5 x2.5 in 1 x Per Week/30 Days ary Discharge Instructions: Apply to wound bed as instructed Secondary Dressing: Woven Gauze Sponge, Non-Sterile 4x4 in 1 x Per Week/30 Days Discharge Instructions: Apply over primary dressing as directed. Secondary Dressing: ABD Pad, 8x10 1 x Per Week/30 Days Discharge Instructions: Apply over primary dressing as directed. Compression Wrap: Kerlix Roll 4.5x3.1 (in/yd) 1 x Per Week/30 Days Discharge Instructions: Apply Kerlix and Coban compression as directed. Compression Wrap: Coban Self-Adherent Wrap 4x5 (in/yd) 1 x Per Week/30 Days Discharge Instructions: Apply over Kerlix as directed. Compression Stockings: Circaid Juxta Lite Compression Wrap (DME) Right  Leg Compression Amount: 30-40 mmHG Discharge Instructions: Apply Circaid Juxta Lite Compression Wrap daily as instructed. Apply first thing in the morning, remove at night before bed. Wound #2 - Lower Leg Wound Laterality: Right, Anterior Cleanser: Wound Cleanser 1 x Per Week/30 Days Discharge Instructions: Cleanse the wound with wound cleanser prior to applying a clean dressing using gauze sponges, not tissue or cotton balls. Peri-Wound Care: Triamcinolone 15 (g) 1 x Per Week/30 Days Discharge Instructions: Use triamcinolone 15 (g) as directed Peri-Wound Care: Sween Lotion (Moisturizing lotion) 1 x Per Week/30 Days Discharge Instructions: Apply moisturizing lotion as directed Prim Dressing: Hydrofera Blue Ready Foam, 2.5 x2.5 in 1 x Per Week/30  Days ary Discharge Instructions: Apply to wound bed as instructed Secondary Dressing: Woven Gauze Sponge, Non-Sterile 4x4 in 1 x Per Week/30 Days Discharge Instructions: Apply over primary dressing as directed. Secondary Dressing: ABD Pad, 8x10 1 x Per Week/30 Days Discharge Instructions: Apply over primary dressing as directed. Compression Wrap: Kerlix Roll 4.5x3.1 (in/yd) 1 x Per Week/30 Days Discharge Instructions: Apply Kerlix and Coban compression as directed. Compression Wrap: Coban Self-Adherent Wrap 4x5 (in/yd) 1 x Per Week/30 Days Discharge Instructions: Apply over Kerlix as directed. Wound #3 - Malleolus Wound Laterality: Left, Medial Cleanser: Wound Cleanser 1 x Per Week/30 Days Discharge Instructions: Cleanse the wound with wound cleanser prior to applying a clean dressing using gauze sponges, not tissue or cotton balls. Peri-Wound Care: Triamcinolone 15 (g) 1 x Per Week/30 Days Discharge Instructions: Use triamcinolone 15 (g) as directed Peri-Wound Care: Sween Lotion (Moisturizing lotion) 1 x Per Week/30 Days Discharge Instructions: Apply moisturizing lotion as directed Prim Dressing: Hydrofera Blue Ready Foam, 2.5 x2.5 in 1 x Per Week/30 Days ary Discharge Instructions: Apply to wound bed as instructed Secondary Dressing: Woven Gauze Sponge, Non-Sterile 4x4 in 1 x Per Week/30 Days Discharge Instructions: Apply over primary dressing as directed. Secondary Dressing: ABD Pad, 8x10 1 x Per Week/30 Days Discharge Instructions: Apply over primary dressing as directed. Compression Wrap: Kerlix Roll 4.5x3.1 (in/yd) 1 x Per Week/30 Days Discharge Instructions: Apply Kerlix and Coban compression as directed. Compression Wrap: Coban Self-Adherent Wrap 4x5 (in/yd) 1 x Per Week/30 Days Discharge Instructions: Apply over Kerlix as directed. Compression Stockings: Circaid Juxta Lite Compression Wrap (DME) Left Leg Compression Amount: 30-40 mmHG Discharge Instructions: Apply Circaid  Juxta Lite Compression Wrap daily as instructed. Apply first thing in the morning, remove at night before bed. Services and Therapies rterial Studies- Bilateral - Arterial studies with ABIs and TBIs to be performed at [facility]. Someone will call you with an appt time. A Patient Medications llergies: No Known Drug Allergies A Notifications Medication Indication Start End benzocaine DOSE topical 20 % aerosol - aerosol topical applied only for debridements by provider. Electronic Signature(s) Signed: 11/07/2020 12:19:17 PM By: Kalman Shan DO Previous Signature: 11/01/2020 3:07:11 PM Version By: Kalman Shan DO Previous Signature: 11/01/2020 5:29:18 PM Version By: Deon Pilling RN, BSN Entered By: Kalman Shan on 11/07/2020 12:13:41 Prescription 11/01/2020 -------------------------------------------------------------------------------- Case, Alvy L. Kalman Shan DO Patient Name: Provider: 03-31-1945 [id number] Date of Birth: NPI#: Male [id number] Sex: DEA #: [phone] Phone #: License #: [place] Patient Address: [place] [address] [place], [place] [address] [place], [place] [address] [phone] Allergies No Known Drug Allergies Medication Medication: Route: Strength: Form: benzocaine 20 % topical aerosol topical 20 % aerosol Class: TOPICAL LOCAL ANESTHETICS Dose: Frequency / Time: Indication: aerosol topical  applied only for debridements by provider. Number of Refills: Number of Units: 0 Generic Substitution: Start Date: End Date: One Time Use: Substitution Permitted No Note to Pharmacy: Hand Signature: Date(s): Electronic Signature(s) Signed: 11/07/2020 12:19:17 PM By: Kalman Shan DO Previous Signature: 11/01/2020 3:07:11 PM Version By: Kalman Shan DO Previous Signature: 11/01/2020 5:29:18 PM Version By: Deon Pilling RN,  BSN Entered By: Kalman Shan on 11/07/2020 12:13:42 -------------------------------------------------------------------------------- Problem List Details Patient Name: Date of Service: [facility], [name] 11/01/2020 1:15 PM Medical Record Number: [id number] Patient Account Number: [id number] Date of Birth/Sex: Treating RN: 1946/01/05 (75 y.o. Male) Deon Pilling Primary Care Provider: [name] Other Clinician: Referring Provider: Treating Provider/Extender: Yaakov Guthrie in Treatment: 0 Active Problems ICD-10 Encounter Code Description Active Date MDM Diagnosis L97.811 Non-pressure chronic ulcer of other part of right lower leg limited to breakdown 11/01/2020 No Yes of skin L97.921 Non-pressure chronic ulcer of unspecified part of left lower leg limited to 11/01/2020 No Yes breakdown of skin E11.622 Type 2 diabetes mellitus with other skin ulcer 11/01/2020 No Yes I87.2 Venous insufficiency (chronic) (peripheral) 11/01/2020 No Yes I89.0 Lymphedema, not elsewhere classified 11/01/2020 No Yes I48.19 Other persistent atrial fibrillation 11/01/2020 No Yes Z79.01 Long term (current) use of anticoagulants 11/01/2020 No Yes Inactive Problems Resolved Problems Electronic Signature(s) Signed: 11/07/2020 12:19:17 PM By: Kalman Shan DO Previous Signature: 11/01/2020 3:07:11 PM Version By: Kalman Shan DO Entered By: Kalman Shan on 11/07/2020 12:18:44 -------------------------------------------------------------------------------- Progress Note Details Patient Name: Date of Service: [facility], [name] 11/01/2020 1:15 PM Medical Record Number: [id number] Patient Account Number: [id number] Date of Birth/Sex: Treating RN: 31-Jan-1945 (75 y.o. Male) Deon Pilling Primary Care Provider: [name] Other Clinician: Referring Provider: Treating Provider/Extender: Yaakov Guthrie in Treatment: 0 Subjective Chief Complaint Information  obtained from Patient Bilateral lower extremity wounds History of Present Illness (HPI) Admission 11/01/2020 Mr. Apollo Timothy is a 75 year old male with a past medical history of controlled type 2 diabetes on oral medications, chronic venous insufficiency and persistent A. fib on Coumadin that presents to the clinic for A 1 year history of waxing and waning of bilateral lower extremity wounds. He states that his wounds opened up spontaneously. They have improved over the past 6 months. He was doing [employer] in August and September which helped heal his wounds. They have opened up again and he is using lotion and compression stockings. He currently denies signs of infection. Patient History Information obtained from Patient, Chart. Allergies No Known Drug Allergies Family History Cancer - Father, No family history of Diabetes, Heart Disease, Hereditary Spherocytosis, Hypertension, Kidney Disease, Lung Disease, Seizures, Stroke, Thyroid Problems, Tuberculosis. Social History Never smoker, Marital Status - Divorced, Alcohol Use - Never, Drug Use - No History, Caffeine Use - Never. Medical History Eyes Denies history of Cataracts, Glaucoma, Optic Neuritis Ear/Nose/Mouth/Throat Denies history of Chronic sinus problems/congestion, Middle ear problems Hematologic/Lymphatic Denies history of Anemia, Hemophilia, Human Immunodeficiency Virus, Lymphedema, Sickle Cell Disease Respiratory Patient has history of Sleep Apnea - CPAP Denies history of Aspiration, Asthma, Chronic Obstructive Pulmonary Disease (COPD), Pneumothorax, Tuberculosis Cardiovascular Patient has history of Arrhythmia - A. Fib, Deep Vein Thrombosis - 2014, Hypertension Denies history of Angina, Congestive Heart Failure, Coronary Artery Disease, Hypotension, Myocardial Infarction, Peripheral Arterial Disease, Peripheral Venous Disease, Phlebitis, Vasculitis Gastrointestinal Denies history of Cirrhosis , Colitis, Crohnoos,  Hepatitis A, Hepatitis B, Hepatitis C Endocrine Denies history of Type I Diabetes, Type II Diabetes Genitourinary Denies history of End Stage Renal Disease Immunological  Denies history of Lupus Erythematosus, Raynaudoos, Scleroderma Integumentary (Skin) Denies history of History of Burn Musculoskeletal Denies history of Gout, Rheumatoid Arthritis, Osteoarthritis, Osteomyelitis Neurologic Denies history of Dementia, Neuropathy, Quadriplegia, Paraplegia, Seizure Disorder Oncologic Denies history of Received Chemotherapy, Received Radiation Psychiatric Denies history of Anorexia/bulimia, Confinement Anxiety Hospitalization/Surgery History - bilateral knee replacements 2010 and 2011. - 2014 DVT BLE. Medical A Surgical History Notes nd Cardiovascular thrombocytopenia dyslipidemia Endocrine Prediabetic Review of Systems (ROS) Constitutional Symptoms (General Health) Denies complaints or symptoms of Fatigue, Fever, Chills, Marked Weight Change. Ear/Nose/Mouth/Throat Denies complaints or symptoms of Chronic sinus problems or rhinitis. Respiratory Denies complaints or symptoms of Chronic or frequent coughs, Shortness of Breath. Cardiovascular Denies complaints or symptoms of Chest pain. Gastrointestinal Denies complaints or symptoms of Frequent diarrhea, Nausea, Vomiting. Endocrine Denies complaints or symptoms of Heat/cold intolerance. Genitourinary Denies complaints or symptoms of Frequent urination. Integumentary (Skin) Complains or has symptoms of Wounds - currently both legs. Musculoskeletal Denies complaints or symptoms of Muscle Pain, Muscle Weakness. Neurologic Denies complaints or symptoms of Numbness/parasthesias. Psychiatric Denies complaints or symptoms of Claustrophobia, Suicidal. Objective Constitutional respirations regular, non-labored and within target range for patient.. Vitals Time Taken: 1:25 PM, Height: 71 in, Source: Stated, Weight: 297 lbs, Source:  Stated, BMI: 41.4, Temperature: 98.5 F, Pulse: 80 bpm, Respiratory Rate: 20 breaths/min, Blood Pressure: 156/85 mmHg. Cardiovascular 2+ dorsalis pedis/posterior tibialis pulses. Psychiatric pleasant and cooperative. General Notes: Small open wounds with granulation tissue to lower extremities bilaterally. No obvious signs of surrounding infection. No drainage noted. Venous stasis dermatitis bilaterally Integumentary (Hair, Skin) Wound #1 status is Open. Original cause of wound was Gradually Appeared. The date acquired was: 11/02/2019. The wound is located on the Right,Lateral Lower Leg. The wound measures 1.2cm length x 1.1cm width x 0.1cm depth; 1.037cm^2 area and 0.104cm^3 volume. There is Fat Layer (Subcutaneous Tissue) exposed. There is no tunneling or undermining noted. There is a medium amount of serosanguineous drainage noted. The wound margin is distinct with the outline attached to the wound base. There is large (67-100%) red granulation within the wound bed. There is no necrotic tissue within the wound bed. Wound #2 status is Open. Original cause of wound was Gradually Appeared. The date acquired was: 11/02/2019. The wound is located on the Right,Anterior Lower Leg. The wound measures 0.4cm length x 0.5cm width x 0.2cm depth; 0.157cm^2 area and 0.031cm^3 volume. There is Fat Layer (Subcutaneous Tissue) exposed. There is no tunneling or undermining noted. There is a medium amount of serosanguineous drainage noted. The wound margin is distinct with the outline attached to the wound base. There is large (67-100%) red granulation within the wound bed. There is no necrotic tissue within the wound bed. Wound #3 status is Open. Original cause of wound was Gradually Appeared. The date acquired was: 11/02/2019. The wound is located on the Left,Medial Malleolus. The wound measures 0.5cm length x 0.3cm width x 0.1cm depth; 0.118cm^2 area and 0.012cm^3 volume. There is Fat Layer (Subcutaneous  Tissue) exposed. There is no tunneling or undermining noted. There is a medium amount of serosanguineous drainage noted. The wound margin is distinct with the outline attached to the wound base. There is large (67-100%) pink, pale granulation within the wound bed. There is no necrotic tissue within the wound bed. Assessment Active Problems ICD-10 Non-pressure chronic ulcer of other part of right lower leg limited to breakdown of skin Non-pressure chronic ulcer of unspecified part of left lower leg limited to breakdown of skin Venous insufficiency (chronic) (peripheral) Lymphedema, not  elsewhere classified Type 2 diabetes mellitus without complications Other persistent atrial fibrillation Long term (current) use of anticoagulants Patient presents with a several month history of open wounds to his lower extremities bilaterally. These appear to be related to venous insufficiency and lymphedema. No signs of infection on exam. Would like to obtain formal ABIs with TBI's Since ABI on the right was 1.33. We will use Kerlix/Coban for now and increase if patient tolerates next visit. Will use Hydrofera Blue under compression. The open wounds do have an appearance of dermatitis and we will use TCA to see if this will help. Follow-up in 1 week. Patient knows not to get the wraps wet and cannot keep these on for more than a week. Procedures Wound #1 Pre-procedure diagnosis of Wound #1 is a Venous Leg Ulcer located on the Right,Lateral Lower Leg .Severity of Tissue Pre Debridement is: Limited to breakdown of skin. There was a Chemical/Enzymatic/Mechanical debridement performed by Deon Pilling, RN.. Other agent used was gauze and wound cleanser. There was no bleeding. The procedure was tolerated well. Post Debridement Measurements: 1.2cm length x 1.1cm width x 0.1cm depth; 0.104cm^3 volume. Character of Wound/Ulcer Post Debridement is stable. Severity of Tissue Post Debridement is: Limited to breakdown of  skin. Post procedure Diagnosis Wound #1: Same as Pre-Procedure Wound #3 Pre-procedure diagnosis of Wound #3 is a Venous Leg Ulcer located on the Left,Medial Malleolus .Severity of Tissue Pre Debridement is: Limited to breakdown of skin. There was a Chemical/Enzymatic/Mechanical debridement performed by Deon Pilling, RN.. Other agent used was gauze and wound cleanser. There was no bleeding. The procedure was tolerated well. Post Debridement Measurements: 0.5cm length x 0.3cm width x 0.1cm depth; 0.012cm^3 volume. Character of Wound/Ulcer Post Debridement is stable. Severity of Tissue Post Debridement is: Limited to breakdown of skin. Post procedure Diagnosis Wound #3: Same as Pre-Procedure Plan Follow-up Appointments: Return Appointment in 1 week. - Dr. Renette Butters DME company will be sending you compression garments juxalites HD Someone will call you with an appt time with [facility] for arterial studies. Bathing/ Shower/ Hygiene: May shower with protection but do not get wound dressing(s) wet. Edema Control - Lymphedema / SCD / Other: Elevate legs to the level of the heart or above for 30 minutes daily and/or when sitting, a frequency of: - 3-4 times a day throughout the day. Avoid standing for long periods of time. Exercise regularly Compression stocking or Garment 30-40 mm/Hg pressure to: - Bring into next appt time- juxtalite HD. Services and Therapies ordered were: Arterial Studies- Bilateral - Arterial studies with ABIs and TBIs to be performed at [facility]. Someone will call you with an appt time. The following medication(s) was prescribed: benzocaine topical 20 % aerosol aerosol topical applied only for debridements by provider. was prescribed at facility WOUND #1: - Lower Leg Wound Laterality: Right, Lateral Cleanser: Wound Cleanser 1 x Per Week/30 Days Discharge Instructions: Cleanse the wound with wound cleanser prior to applying a clean dressing using gauze  sponges, not tissue or cotton balls. Peri-Wound Care: Triamcinolone 15 (g) 1 x Per Week/30 Days Discharge Instructions: Use triamcinolone 15 (g) as directed Peri-Wound Care: Sween Lotion (Moisturizing lotion) 1 x Per Week/30 Days Discharge Instructions: Apply moisturizing lotion as directed Prim Dressing: Hydrofera Blue Ready Foam, 2.5 x2.5 in 1 x Per Week/30 Days ary Discharge Instructions: Apply to wound bed as instructed Secondary Dressing: Woven Gauze Sponge, Non-Sterile 4x4 in 1 x Per Week/30 Days Discharge Instructions: Apply over primary dressing as directed.  Secondary Dressing: ABD Pad, 8x10 1 x Per Week/30 Days Discharge Instructions: Apply over primary dressing as directed. Com pression Wrap: Kerlix Roll 4.5x3.1 (in/yd) 1 x Per Week/30 Days Discharge Instructions: Apply Kerlix and Coban compression as directed. Com pression Wrap: Coban Self-Adherent Wrap 4x5 (in/yd) 1 x Per Week/30 Days Discharge Instructions: Apply over Kerlix as directed. Com pression Stockings: Circaid Juxta Lite Compression Wrap (DME) Compression Amount: 30-40 mmHg (right) Discharge Instructions: Apply Circaid Juxta Lite Compression Wrap daily as instructed. Apply first thing in the morning, remove at night before bed. WOUND #2: - Lower Leg Wound Laterality: Right, Anterior Cleanser: Wound Cleanser 1 x Per Week/30 Days Discharge Instructions: Cleanse the wound with wound cleanser prior to applying a clean dressing using gauze sponges, not tissue or cotton balls. Peri-Wound Care: Triamcinolone 15 (g) 1 x Per Week/30 Days Discharge Instructions: Use triamcinolone 15 (g) as directed Peri-Wound Care: Sween Lotion (Moisturizing lotion) 1 x Per Week/30 Days Discharge Instructions: Apply moisturizing lotion as directed Prim Dressing: Hydrofera Blue Ready Foam, 2.5 x2.5 in 1 x Per Week/30 Days ary Discharge Instructions: Apply to wound bed as instructed Secondary Dressing: Woven Gauze Sponge, Non-Sterile 4x4 in 1  x Per Week/30 Days Discharge Instructions: Apply over primary dressing as directed. Secondary Dressing: ABD Pad, 8x10 1 x Per Week/30 Days Discharge Instructions: Apply over primary dressing as directed. Com pression Wrap: Kerlix Roll 4.5x3.1 (in/yd) 1 x Per Week/30 Days Discharge Instructions: Apply Kerlix and Coban compression as directed. Com pression Wrap: Coban Self-Adherent Wrap 4x5 (in/yd) 1 x Per Week/30 Days Discharge Instructions: Apply over Kerlix as directed. WOUND #3: - Malleolus Wound Laterality: Left, Medial Cleanser: Wound Cleanser 1 x Per Week/30 Days Discharge Instructions: Cleanse the wound with wound cleanser prior to applying a clean dressing using gauze sponges, not tissue or cotton balls. Peri-Wound Care: Triamcinolone 15 (g) 1 x Per Week/30 Days Discharge Instructions: Use triamcinolone 15 (g) as directed Peri-Wound Care: Sween Lotion (Moisturizing lotion) 1 x Per Week/30 Days Discharge Instructions: Apply moisturizing lotion as directed Prim Dressing: Hydrofera Blue Ready Foam, 2.5 x2.5 in 1 x Per Week/30 Days ary Discharge Instructions: Apply to wound bed as instructed Secondary Dressing: Woven Gauze Sponge, Non-Sterile 4x4 in 1 x Per Week/30 Days Discharge Instructions: Apply over primary dressing as directed. Secondary Dressing: ABD Pad, 8x10 1 x Per Week/30 Days Discharge Instructions: Apply over primary dressing as directed. Com pression Wrap: Kerlix Roll 4.5x3.1 (in/yd) 1 x Per Week/30 Days Discharge Instructions: Apply Kerlix and Coban compression as directed. Com pression Wrap: Coban Self-Adherent Wrap 4x5 (in/yd) 1 x Per Week/30 Days Discharge Instructions: Apply over Kerlix as directed. Com pression Stockings: Circaid Juxta Lite Compression Wrap (DME) Compression Amount: 30-40 mmHg (left) Discharge Instructions: Apply Circaid Juxta Lite Compression Wrap daily as instructed. Apply first thing in the morning, remove at night before bed. 1. Hydrofera Blue  under compression wrap 2. TCA 3. Follow-up in 1 week Electronic Signature(s) Signed: 11/07/2020 12:19:17 PM By: Kalman Shan DO Entered By: Kalman Shan on 11/07/2020 12:17:32 -------------------------------------------------------------------------------- HxROS Details Patient Name: Date of Service: [facility], [name] 11/01/2020 1:15 PM Medical Record Number: [id number] Patient Account Number: [id number] Date of Birth/Sex: Treating RN: March 25, 1945 (75 y.o. Male) Deon Pilling Primary Care Provider: [name] Other Clinician: Referring Provider: Treating Provider/Extender: Yaakov Guthrie in Treatment: 0 Information Obtained From Patient Chart Constitutional Symptoms (General Health) Complaints and Symptoms: Negative for: Fatigue; Fever; Chills; Marked Weight Change Ear/Nose/Mouth/Throat Complaints and Symptoms: Negative for: Chronic sinus problems  or rhinitis Medical History: Negative for: Chronic sinus problems/congestion; Middle ear problems Respiratory Complaints and Symptoms: Negative for: Chronic or frequent coughs; Shortness of Breath Medical History: Positive for: Sleep Apnea - CPAP Negative for: Aspiration; Asthma; Chronic Obstructive Pulmonary Disease (COPD); Pneumothorax; Tuberculosis Cardiovascular Complaints and Symptoms: Negative for: Chest pain Medical History: Positive for: Arrhythmia - A. Fib; Deep Vein Thrombosis - 2014; Hypertension Negative for: Angina; Congestive Heart Failure; Coronary Artery Disease; Hypotension; Myocardial Infarction; Peripheral Arterial Disease; Peripheral Venous Disease; Phlebitis; Vasculitis Past Medical History Notes: thrombocytopenia dyslipidemia Gastrointestinal Complaints and Symptoms: Negative for: Frequent diarrhea; Nausea; Vomiting Medical History: Negative for: Cirrhosis ; Colitis; Crohns; Hepatitis A; Hepatitis B; Hepatitis C Endocrine Complaints and Symptoms: Negative for: Heat/cold  intolerance Medical History: Negative for: Type I Diabetes; Type II Diabetes Past Medical History Notes: Prediabetic Genitourinary Complaints and Symptoms: Negative for: Frequent urination Medical History: Negative for: End Stage Renal Disease Integumentary (Skin) Complaints and Symptoms: Positive for: Wounds - currently both legs Medical History: Negative for: History of Burn Musculoskeletal Complaints and Symptoms: Negative for: Muscle Pain; Muscle Weakness Medical History: Negative for: Gout; Rheumatoid Arthritis; Osteoarthritis; Osteomyelitis Neurologic Complaints and Symptoms: Negative for: Numbness/parasthesias Medical History: Negative for: Dementia; Neuropathy; Quadriplegia; Paraplegia; Seizure Disorder Psychiatric Complaints and Symptoms: Negative for: Claustrophobia; Suicidal Medical History: Negative for: Anorexia/bulimia; Confinement Anxiety Eyes Medical History: Negative for: Cataracts; Glaucoma; Optic Neuritis Hematologic/Lymphatic Medical History: Negative for: Anemia; Hemophilia; Human Immunodeficiency Virus; Lymphedema; Sickle Cell Disease Immunological Medical History: Negative for: Lupus Erythematosus; Raynauds; Scleroderma Oncologic Medical History: Negative for: Received Chemotherapy; Received Radiation Immunizations Pneumococcal Vaccine: Received Pneumococcal Vaccination: Yes Received Pneumococcal Vaccination On or After 60th Birthday: Yes Implantable Devices No devices added Hospitalization / Surgery History Type of Hospitalization/Surgery bilateral knee replacements 2010 and 2011 2014 DVT BLE Family and Social History Cancer: Yes - Father; Diabetes: No; Heart Disease: No; Hereditary Spherocytosis: No; Hypertension: No; Kidney Disease: No; Lung Disease: No; Seizures: No; Stroke: No; Thyroid Problems: No; Tuberculosis: No; Never smoker; Marital Status - Divorced; Alcohol Use: Never; Drug Use: No History; Caffeine Use: Never; Financial  Concerns: No; Food, Clothing or Shelter Needs: No; Support System Lacking: No; Transportation Concerns: No Electronic Signature(s) Signed: 11/01/2020 3:07:11 PM By: Kalman Shan DO Signed: 11/01/2020 5:29:18 PM By: Deon Pilling RN, BSN Entered By: Deon Pilling on 11/01/2020 13:42:53 -------------------------------------------------------------------------------- SuperBill Details Patient Name: Date of Service: [facility], HA RV Feliz Beam 11/01/2020 Medical Record Number: [id number] Patient Account Number: [id number] Date of Birth/Sex: Treating RN: [birth year] (75 y.o. 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 E11.622 Type 2 diabetes mellitus with other skin ulcer I87.2 Venous insufficiency (chronic) (peripheral) I89.0 Lymphedema, not elsewhere classified I48.19 Other persistent atrial fibrillation Z79.01 Long term (current) use of anticoagulants Facility Procedures CPT4 Code: [id number] Description: [phone] - WOUND CARE VISIT-LEV 5 EST PT Modifier: Quantity: 1 CPT4 Code: [id number] Description: 09381 - DEBRIDE W/O ANES NON SELECT Modifier: Quantity: 1 Physician Procedures : CPT4 Code Description Modifier [id number] WC PHYS LEVEL 3 NEW PT ICD-10 Diagnosis Description L97.811 Non-pressure chronic ulcer of other part of right lower leg limited to breakdown of skin L97.921 Non-pressure chronic ulcer of unspecified part of left  lower leg limited to breakdown of skin I87.2 Venous insufficiency (  chronic) (peripheral) E11.622 Type 2 diabetes mellitus with other skin ulcer Quantity: 1 Electronic Signature(s) Signed: 11/07/2020  12:19:17 PM By: Kalman Shan DO Previous Signature: 11/01/2020 5:29:18 PM Version By: Deon Pilling RN, BSN Previous Signature: 11/02/2020 9:13:50 AM Version By: Kalman Shan DO Previous Signature: 11/01/2020 3:07:11 PM Version By: Kalman Shan DO Previous Signature: 11/01/2020 3:07:11 PM Version By: Kalman Shan DO Entered By: Kalman Shan on 11/07/2020 12:19:01

## 2020-11-08 ENCOUNTER — Other Ambulatory Visit: Payer: Self-pay

## 2020-11-08 ENCOUNTER — Encounter (HOSPITAL_BASED_OUTPATIENT_CLINIC_OR_DEPARTMENT_OTHER): Payer: Medicare Other | Attending: Internal Medicine | Admitting: Internal Medicine

## 2020-11-08 DIAGNOSIS — I872 Venous insufficiency (chronic) (peripheral): Secondary | ICD-10-CM | POA: Insufficient documentation

## 2020-11-08 DIAGNOSIS — L97811 Non-pressure chronic ulcer of other part of right lower leg limited to breakdown of skin: Secondary | ICD-10-CM | POA: Insufficient documentation

## 2020-11-08 DIAGNOSIS — Z7984 Long term (current) use of oral hypoglycemic drugs: Secondary | ICD-10-CM | POA: Insufficient documentation

## 2020-11-08 DIAGNOSIS — I890 Lymphedema, not elsewhere classified: Secondary | ICD-10-CM | POA: Diagnosis not present

## 2020-11-08 DIAGNOSIS — Z7901 Long term (current) use of anticoagulants: Secondary | ICD-10-CM | POA: Insufficient documentation

## 2020-11-08 DIAGNOSIS — E11622 Type 2 diabetes mellitus with other skin ulcer: Secondary | ICD-10-CM | POA: Insufficient documentation

## 2020-11-08 DIAGNOSIS — L97921 Non-pressure chronic ulcer of unspecified part of left lower leg limited to breakdown of skin: Secondary | ICD-10-CM | POA: Insufficient documentation

## 2020-11-08 DIAGNOSIS — I4819 Other persistent atrial fibrillation: Secondary | ICD-10-CM | POA: Diagnosis not present

## 2020-11-08 NOTE — Progress Notes (Addendum)
Brett [name] ([id number]) Visit Report for 11/08/2020 Arrival Information Details Patient Name: Date of Service: Brett [name] 11/08/2020 2:45 PM Medical Record Number: [id number] Patient Account Number: [id number] Date of Birth/Sex: Treating RN: [birth year] (75 y.o. Brett [name] Primary Care Brett [name]: Brett [name] Other Clinician: Referring Brett [name]: Treating Brett [name]/Extender: Brett [name] in Treatment: 1 Visit Information History Since Last Visit Added or deleted any medications: No Patient Arrived: Brett [name] Any new allergies or adverse reactions: No Arrival Time: 15:18 Had a fall or experienced change in No Accompanied By: self activities of daily living that may affect Transfer Assistance: None risk of falls: Patient Identification Verified: Yes Signs or symptoms of abuse/neglect since last visito No Secondary Verification Process Completed: Yes Hospitalized since last visit: No Patient Requires Transmission-Based Precautions: No Implantable device outside of the [name] excluding No Patient Has Alerts: Yes cellular tissue based products placed in the [name] Patient Alerts: Patient on Blood Thinner since last visit: Has Dressing in Place as Prescribed: Yes Has Compression in Place as Prescribed: Yes Pain Present Now: No Electronic Signature(s) Signed: 11/08/2020 4:07:03 PM By: Baruch Gouty RN, BSN Entered By: Baruch Gouty on 11/08/2020 15:24:43 -------------------------------------------------------------------------------- [name] Level of Care Assessment Details Patient Name: Date of Service: Brett [name], Brett RV EY L. 11/08/2020 2:45 PM Medical Record Number: [id number] Patient Account Number: [id number] Date of Birth/Sex: Treating RN: 06-Jul-1945 (75 y.o. Brett [name] Primary Care Brett [name]: Brett [name] Other Clinician: Referring Brett [name]: Treating Brett [name]/Extender: Brett [name] in Treatment:  1 [name] Level of Care Assessment Items TOOL 4 Quantity Score []  - 0 Use when only an EandM is performed on FOLLOW-UP visit ASSESSMENTS - Nursing Assessment / Reassessment X- 1 10 Reassessment of Co-morbidities (includes updates in patient status) X- 1 5 Reassessment of Adherence to Treatment Plan ASSESSMENTS - Wound and Skin A ssessment / Reassessment []  - 0 Simple Wound Assessment / Reassessment - one wound X- 3 5 Complex Wound Assessment / Reassessment - multiple wounds []  - 0 Dermatologic / Skin Assessment (not related to wound area) ASSESSMENTS - Focused Assessment X- 3 5 Circumferential Edema Measurements - multi extremities []  - 0 Nutritional Assessment / Counseling / Intervention X- 1 5 Lower Extremity Assessment (monofilament, tuning fork, pulses) []  - 0 Peripheral Arterial Disease Assessment (using hand held doppler) ASSESSMENTS - Ostomy and/or Continence Assessment and Care []  - 0 Incontinence Assessment and Management []  - 0 Ostomy Care Assessment and Management (repouching, etc.) PROCESS - Coordination of Care X - Simple Patient / Family Education for ongoing care 1 15 []  - 0 Complex (extensive) Patient / Family Education for ongoing care X- 1 10 Staff obtains Programmer, systems, Records, T Results / Process Orders est []  - 0 Staff telephones HHA, Nursing Homes / Clarify orders / etc []  - 0 Routine Transfer to another Facility (non-emergent condition) []  - 0 Routine Hospital Admission (non-emergent condition) []  - 0 New Admissions / Biomedical engineer / Ordering NPWT Apligraf, etc. , []  - 0 Emergency Hospital Admission (emergent condition) X- 1 10 Simple Discharge Coordination []  - 0 Complex (extensive) Discharge Coordination PROCESS - Special Needs []  - 0 Pediatric / Minor Patient Management []  - 0 Isolation Patient Management []  - 0 Hearing / Language / Visual special needs []  - 0 Assessment of Community assistance (transportation, D/C planning,  etc.) []  - 0 Additional assistance / Altered mentation []  - 0 Support Surface(s) Assessment (bed, cushion, seat, etc.) INTERVENTIONS - Wound Cleansing / Measurement []  - 0  Simple Wound Cleansing - one wound X- 3 5 Complex Wound Cleansing - multiple wounds X- 1 5 Wound Imaging (photographs - any number of wounds) []  - 0 Wound Tracing (instead of photographs) []  - 0 Simple Wound Measurement - one wound X- 3 5 Complex Wound Measurement - multiple wounds INTERVENTIONS - Wound Dressings X - Small Wound Dressing one or multiple wounds 2 10 []  - 0 Medium Wound Dressing one or multiple wounds []  - 0 Large Wound Dressing one or multiple wounds []  - 0 Application of Medications - topical []  - 0 Application of Medications - injection INTERVENTIONS - Miscellaneous []  - 0 External ear exam []  - 0 Specimen Collection (cultures, biopsies, blood, body fluids, etc.) []  - 0 Specimen(s) / Culture(s) sent or taken to Lab for analysis []  - 0 Patient Transfer (multiple staff / Civil Service fast streamer / Similar devices) []  - 0 Simple Staple / Suture removal (25 or less) []  - 0 Complex Staple / Suture removal (26 or more) []  - 0 Hypo / Hyperglycemic Management (close monitor of Blood Glucose) []  - 0 Ankle / Brachial Index (ABI) - do not check if billed separately X- 1 5 Vital Signs Has the patient been seen at the hospital within the last three years: Yes Total Score: 145 Level Of Care: New/Established - Level 4 Electronic Signature(s) Signed: 11/08/2020 4:07:03 PM By: Baruch Gouty RN, BSN Entered By: Baruch Gouty on 11/08/2020 16:05:08 -------------------------------------------------------------------------------- Encounter Discharge Information Details Patient Name: Date of Service: Brett [name], Brett RV EY L. 11/08/2020 2:45 PM Medical Record Number: [id number] Patient Account Number: [id number] Date of Birth/Sex: Treating RN: 1945/01/10 (75 y.o. Brett [name] Primary Care Brett [name]:  Brett [name] Other Clinician: Referring Brett [name]: Treating Brett [name]/Extender: Brett [name] in Treatment: 1 Encounter Discharge Information Items Discharge Condition: Stable Ambulatory Status: Cane Discharge Destination: Home Transportation: Private Auto Accompanied By: self Schedule Follow-up Appointment: Yes Clinical Summary of Care: Patient Declined Electronic Signature(s) Signed: 11/08/2020 4:07:03 PM By: Baruch Gouty RN, BSN Entered By: Baruch Gouty on 11/08/2020 16:06:27 -------------------------------------------------------------------------------- Lower Extremity Assessment Details Patient Name: Date of Service: Brett [name], Brett RV EY L. 11/08/2020 2:45 PM Medical Record Number: [id number] Patient Account Number: [id number] Date of Birth/Sex: Treating RN: October 12, 1945 (75 y.o. Brett [name] Primary Care Sowmya Partridge: Brett [name] Other Clinician: Referring Naija Troost: Treating Ailyn Gladd/Extender: Angie Fava Weeks in Treatment: 1 Edema Assessment Assessed: [Left: No] [Right: No] Edema: [Left: Yes] [Right: Yes] Calf Left: Right: Point of Measurement: 33 cm From Medial Instep 43.2 cm 42 cm Ankle Left: Right: Point of Measurement: 11 cm From Medial Instep 26 cm 25.5 cm Vascular Assessment Pulses: Dorsalis Pedis Palpable: [Left:Yes] [Right:Yes] Electronic Signature(s) Signed: 11/08/2020 4:07:03 PM By: Baruch Gouty RN, BSN Entered By: Baruch Gouty on 11/08/2020 15:32:52 -------------------------------------------------------------------------------- Multi Wound Chart Details Patient Name: Date of Service: Brett [name], Brett RV EY L. 11/08/2020 2:45 PM Medical Record Number: [id number] Patient Account Number: [id number] Date of Birth/Sex: Treating RN: [birth year] (75 y.o. Lorette Ang, Meta.Reding Primary Care Dondrea Clendenin: Brett [name] Other Clinician: Referring Devory Mckinzie: Treating Tidus Upchurch/Extender: Angie Fava Weeks in Treatment: 1 Vital Signs Height(in): 71 Pulse(bpm): 37 Weight(lbs): 297 Blood Pressure(mmHg): 144/86 Body Mass Index(BMI): 41 Temperature(F): 98.2 Respiratory Rate(breaths/min): 20 Photos: Right, Lateral Lower Leg Right, Anterior Lower Leg Left, Medial Malleolus Wound Location: Gradually Appeared Gradually Appeared Gradually Appeared Wounding Event: Venous Leg Ulcer Venous Leg Ulcer Venous Leg Ulcer Primary Etiology: Lymphedema Lymphedema Lymphedema Secondary Etiology: Sleep Apnea, Arrhythmia, Deep Vein Sleep Apnea, Arrhythmia, Deep  Vein Sleep Apnea, Arrhythmia, Deep Vein Comorbid History: Thrombosis, Hypertension Thrombosis, Hypertension Thrombosis, Hypertension 11/02/2019 11/02/2019 11/02/2019 Date Acquired: 1 1 1  Weeks of Treatment: Open Open Open Wound Status: 0.8x0.8x0.1 0x0x0 0.1x1x0.1 Measurements L x W x D (cm) 0.503 0 0.079 A (cm) : rea 0.05 0 0.008 Volume (cm) : 51.50% 100.00% 33.10% % Reduction in Area: 51.90% 100.00% 33.30% % Reduction in Volume: Full Thickness Without Exposed Full Thickness Without Exposed Full Thickness Without Exposed Classification: Support Structures Support Structures Support Structures Small None Present None Present Exudate Amount: Serosanguineous N/A N/A Exudate Type: red, brown N/A N/A Exudate Color: Distinct, outline attached Distinct, outline attached Distinct, outline attached Wound Margin: Large (67-100%) None Present (0%) None Present (0%) Granulation Amount: Red N/A N/A Granulation Quality: None Present (0%) None Present (0%) None Present (0%) Necrotic Amount: Fat Layer (Subcutaneous Tissue): Yes Fascia: No Fascia: No Exposed Structures: Fascia: No Fat Layer (Subcutaneous Tissue): No Fat Layer (Subcutaneous Tissue): No Tendon: No Tendon: No Tendon: No Muscle: No Muscle: No Muscle: No Joint: No Joint: No Joint: No Bone: No Bone: No Bone: No Limited to Skin Breakdown Small (1-33%)  Large (67-100%) Large (67-100%) Epithelialization: Treatment Notes Wound #1 (Lower Leg) Wound Laterality: Right, Lateral Cleanser Wound Cleanser Discharge Instruction: Cleanse the wound with wound cleanser prior to applying a clean dressing using gauze sponges, not tissue or cotton balls. Peri-Wound Care Sween Lotion (Moisturizing lotion) Discharge Instruction: Apply moisturizing lotion to legs with dressing changes Topical Primary Dressing Hydrofera Blue Ready Foam, 2.5 x2.5 in Discharge Instruction: Apply to wound bed as instructed Secondary Dressing Zetuvit Plus Silicone Border Dressing 4x4 (in/in) Discharge Instruction: Apply silicone border over primary dressing as directed. Secured With Compression Wrap Compression Stockings Circaid Juxta Lite Compression Wrap Quantity: 1 Right Leg Compression Amount: 30-40 mmHg Discharge Instruction: Apply Circaid Juxta Lite Compression Wrap daily as instructed. Apply first thing in the morning, remove at night before bed. Add-Ons Wound #2 (Lower Leg) Wound Laterality: Right, Anterior Cleanser Peri-Wound Care Topical Primary Dressing Secondary Dressing Secured With Compression Wrap Compression Stockings Add-Ons Wound #3 (Malleolus) Wound Laterality: Left, Medial Cleanser Wound Cleanser Discharge Instruction: Cleanse the wound with wound cleanser prior to applying a clean dressing using gauze sponges, not tissue or cotton balls. Peri-Wound Care Sween Lotion (Moisturizing lotion) Discharge Instruction: Apply moisturizing lotion to legs with dressing changes Topical Primary Dressing Hydrofera Blue Ready Foam, 2.5 x2.5 in Discharge Instruction: Apply to wound bed as instructed Secondary Dressing Zetuvit Plus Silicone Border Dressing 4x4 (in/in) Discharge Instruction: Apply silicone border over primary dressing as directed. Secured With Compression Wrap Compression Stockings Environmental education officer) Signed: 11/10/2020  3:43:02 PM By: Kalman Shan DO Signed: 11/10/2020 6:11:26 PM By: Deon Pilling RN, BSN Entered By: Kalman Shan on 11/10/2020 15:38:41 -------------------------------------------------------------------------------- Multi-Disciplinary Care Plan Details Patient Name: Date of Service: Brett [name], Brett RV EY L. 11/08/2020 2:45 PM Medical Record Number: [id number] Patient Account Number: [id number] Date of Birth/Sex: Treating RN: 09-10-1945 (75 y.o. Brett [name] Primary Care Yolander Goodie: Brett [name] Other Clinician: Referring Lean Jaeger: Treating Maanvi Lecompte/Extender: Angie Fava Weeks in Treatment: 1 Active Inactive Wound/Skin Impairment Nursing Diagnoses: Knowledge deficit related to ulceration/compromised skin integrity Goals: Patient/caregiver will verbalize understanding of skin care regimen Date Initiated: 11/01/2020 Target Resolution Date: 12/06/2020 Goal Status: Active Interventions: Assess patient/caregiver ability to perform ulcer/skin care regimen upon admission and as needed Assess ulceration(s) every visit Provide education on ulcer and skin care Treatment Activities: Skin care regimen initiated : 11/01/2020 Topical wound management initiated : 11/01/2020  Notes: Electronic Signature(s) Signed: 11/08/2020 4:07:03 PM By: Baruch Gouty RN, BSN Entered By: Baruch Gouty on 11/08/2020 15:36:47 -------------------------------------------------------------------------------- Pain Assessment Details Patient Name: Date of Service: Brett [name], Brett RV EY L. 11/08/2020 2:45 PM Medical Record Number: [id number] Patient Account Number: [id number] Date of Birth/Sex: Treating RN: Nov 08, 1945 (75 y.o. Brett [name] Primary Care Azyria Osmon: Brett [name] Other Clinician: Referring Foye Damron: Treating Logen Fowle/Extender: Angie Fava Weeks in Treatment: 1 Active Problems Location of Pain Severity and Description of Pain Patient Has  Paino No Site Locations Rate the pain. Current Pain Level: 0 Pain Management and Medication Current Pain Management: Electronic Signature(s) Signed: 11/08/2020 4:07:03 PM By: Baruch Gouty RN, BSN Entered By: Baruch Gouty on 11/08/2020 15:25:53 -------------------------------------------------------------------------------- Patient/Caregiver Education Details Patient Name: Date of Service: Brett [name], Brett [name] 11/1/2022andnbsp2:45 PM Medical Record Number: [id number] Patient Account Number: [id number] Date of Birth/Gender: Treating RN: [birth year] (75 y.o. Brett [name] Primary Care Physician: Brett [name] Other Clinician: Referring Physician: Treating Physician/Extender: Brett [name] in Treatment: 1 Education Assessment Education Provided To: Patient Education Topics Provided Venous: Methods: Explain/Verbal Responses: Reinforcements needed, State content correctly Wound/Skin Impairment: Methods: Explain/Verbal Responses: Reinforcements needed, State content correctly Electronic Signature(s) Signed: 11/08/2020 4:07:03 PM By: Baruch Gouty RN, BSN Entered By: Baruch Gouty on 11/08/2020 15:38:44 -------------------------------------------------------------------------------- Wound Assessment Details Patient Name: Date of Service: [facility], Brett RV EY L. 11/08/2020 2:45 PM Medical Record Number: [id number] Patient Account Number: [id number] Date of Birth/Sex: Treating RN: 1945/03/18 (75 y.o. Brett [name] Primary Care Vinnie Gombert: Brett [name] Other Clinician: Referring Raquan Iannone: Treating Jaelyn Cloninger/Extender: Angie Fava Weeks in Treatment: 1 Wound Status Wound Number: 1 Primary Etiology: Venous Leg Ulcer Wound Location: Right, Lateral Lower Leg Secondary Lymphedema Etiology: Wounding Event: Gradually Appeared Wound Status: Open Date Acquired: 11/02/2019 Comorbid History: Sleep Apnea, Arrhythmia, Deep Vein  Thrombosis, Weeks Of Treatment: 1 Hypertension Clustered Wound: No Photos Wound Measurements Length: (cm) 0.8 Width: (cm) 0.8 Depth: (cm) 0.1 Area: (cm) 0.503 Volume: (cm) 0.05 % Reduction in Area: 51.5% % Reduction in Volume: 51.9% Epithelialization: Small (1-33%) Tunneling: No Undermining: No Wound Description Classification: Full Thickness Without Exposed Support Structures Wound Margin: Distinct, outline attached Exudate Amount: Small Exudate Type: Serosanguineous Exudate Color: red, brown Foul Odor After Cleansing: No Slough/Fibrino No Wound Bed Granulation Amount: Large (67-100%) Exposed Structure Granulation Quality: Red Fascia Exposed: No Necrotic Amount: None Present (0%) Fat Layer (Subcutaneous Tissue) Exposed: Yes Tendon Exposed: No Muscle Exposed: No Joint Exposed: No Bone Exposed: No Treatment Notes Wound #1 (Lower Leg) Wound Laterality: Right, Lateral Cleanser Wound Cleanser Discharge Instruction: Cleanse the wound with wound cleanser prior to applying a clean dressing using gauze sponges, not tissue or cotton balls. Peri-Wound Care Sween Lotion (Moisturizing lotion) Discharge Instruction: Apply moisturizing lotion to legs with dressing changes Topical Primary Dressing Hydrofera Blue Ready Foam, 2.5 x2.5 in Discharge Instruction: Apply to wound bed as instructed Secondary Dressing Zetuvit Plus Silicone Border Dressing 4x4 (in/in) Discharge Instruction: Apply silicone border over primary dressing as directed. Secured With Compression Wrap Compression Stockings Circaid Juxta Lite Compression Wrap Quantity: 1 Right Leg Compression Amount: 30-40 mmHg Discharge Instruction: Apply Circaid Juxta Lite Compression Wrap daily as instructed. Apply first thing in the morning, remove at night before bed. Add-Ons Electronic Signature(s) Signed: 11/08/2020 4:07:03 PM By: Baruch Gouty RN, BSN Entered By: Baruch Gouty on 11/08/2020  15:37:09 -------------------------------------------------------------------------------- Wound Assessment Details Patient Name: Date of Service: [name], Brett RV EY L. 11/08/2020 2:45 PM Medical Record Number:  [id number] Patient Account Number: [id number] Date of Birth/Sex: Treating RN: Oct 24, 1945 (75 y.o. Brett [name] Primary Care Nachman Sundt: Brett [name] Other Clinician: Referring Valeri Sula: Treating Areen Trautner/Extender: Angie Fava Weeks in Treatment: 1 Wound Status Wound Number: 2 Primary Etiology: Venous Leg Ulcer Wound Location: Right, Anterior Lower Leg Secondary Lymphedema Etiology: Wounding Event: Gradually Appeared Wound Status: Open Date Acquired: 11/02/2019 Comorbid History: Sleep Apnea, Arrhythmia, Deep Vein Thrombosis, Weeks Of Treatment: 1 Hypertension Clustered Wound: No Photos Wound Measurements Length: (cm) Width: (cm) Depth: (cm) Area: (cm) Volume: (cm) 0 % Reduction in Area: 100% 0 % Reduction in Volume: 100% 0 Epithelialization: Large (67-100%) 0 Tunneling: No 0 Undermining: No Wound Description Classification: Full Thickness Without Exposed Support Structures Wound Margin: Distinct, outline attached Exudate Amount: None Present Foul Odor After Cleansing: No Slough/Fibrino No Wound Bed Granulation Amount: None Present (0%) Exposed Structure Necrotic Amount: None Present (0%) Fascia Exposed: No Fat Layer (Subcutaneous Tissue) Exposed: No Tendon Exposed: No Muscle Exposed: No Joint Exposed: No Bone Exposed: No Electronic Signature(s) Signed: 11/08/2020 4:07:03 PM By: Baruch Gouty RN, BSN Entered By: Baruch Gouty on 11/08/2020 15:37:48 -------------------------------------------------------------------------------- Wound Assessment Details Patient Name: Date of Service: [name], Brett RV EY L. 11/08/2020 2:45 PM Medical Record Number: [id number] Patient Account Number: [id number] Date of Birth/Sex: Treating  RN: [birth year] (75 y.o. Brett [name] Primary Care Hubert Derstine: Brett [name] Other Clinician: Referring Ozell Juhasz: Treating Karma Hiney/Extender: Angie Fava Weeks in Treatment: 1 Wound Status Wound Number: 3 Primary Etiology: Venous Leg Ulcer Wound Location: Left, Medial Malleolus Secondary Lymphedema Etiology: Wounding Event: Gradually Appeared Wound Status: Open Date Acquired: 11/02/2019 Comorbid History: Sleep Apnea, Arrhythmia, Deep Vein Thrombosis, Weeks Of Treatment: 1 Hypertension Clustered Wound: No Photos Wound Measurements Length: (cm) 0.1 Width: (cm) 1 Depth: (cm) 0.1 Area: (cm) 0.079 Volume: (cm) 0.008 % Reduction in Area: 33.1% % Reduction in Volume: 33.3% Epithelialization: Large (67-100%) Tunneling: No Undermining: No Wound Description Classification: Full Thickness Without Exposed Support Structures Wound Margin: Distinct, outline attached Exudate Amount: None Present Foul Odor After Cleansing: No Slough/Fibrino No Wound Bed Granulation Amount: None Present (0%) Exposed Structure Necrotic Amount: None Present (0%) Fascia Exposed: No Fat Layer (Subcutaneous Tissue) Exposed: No Tendon Exposed: No Muscle Exposed: No Joint Exposed: No Bone Exposed: No Limited to Skin Breakdown Treatment Notes Wound #3 (Malleolus) Wound Laterality: Left, Medial Cleanser Wound Cleanser Discharge Instruction: Cleanse the wound with wound cleanser prior to applying a clean dressing using gauze sponges, not tissue or cotton balls. Peri-Wound Care Sween Lotion (Moisturizing lotion) Discharge Instruction: Apply moisturizing lotion to legs with dressing changes Topical Primary Dressing Hydrofera Blue Ready Foam, 2.5 x2.5 in Discharge Instruction: Apply to wound bed as instructed Secondary Dressing Zetuvit Plus Silicone Border Dressing 4x4 (in/in) Discharge Instruction: Apply silicone border over primary dressing as directed. Secured  With Compression Wrap Compression Stockings Environmental education officer) Signed: 11/08/2020 4:07:03 PM By: Baruch Gouty RN, BSN Entered By: Baruch Gouty on 11/08/2020 15:38:38 -------------------------------------------------------------------------------- Vitals Details Patient Name: Date of Service: [facility], Brett RV EY L. 11/08/2020 2:45 PM Medical Record Number: [id number] Patient Account Number: [id number] Date of Birth/Sex: Treating RN: Jul 08, 1945 (75 y.o. Brett [name] Primary Care Delle Andrzejewski: Brett [name] Other Clinician: Referring Kanen Mottola: Treating Mindy Behnken/Extender: Angie Fava Weeks in Treatment: 1 Vital Signs Time Taken: 15:24 Temperature (F): 98.2 Height (in): 71 Pulse (bpm): 65 Source: Stated Respiratory Rate (breaths/min): 20 Weight (lbs): 297 Blood Pressure (mmHg): 144/86 Source: Stated Reference Range: 80 - 120 mg / dl Body Mass  Index (BMI): 41.4 Electronic Signature(s) Signed: 11/08/2020 4:07:03 PM By: Baruch Gouty RN, BSN Entered By: Baruch Gouty on 11/08/2020 15:25:35

## 2020-11-10 NOTE — Progress Notes (Signed)
Brett, [name] ([id number]) Visit Report for 11/08/2020 Chief Complaint Document Details Patient Name: Date of Service: [facility] [place] 11/08/2020 2:45 PM Medical Record Number: [id number] Patient Account Number: [id number] Date of Birth/Sex: Treating RN: [birth year] (75 y.o. [name] Primary Care Provider: [name] Other Clinician: Referring Provider: Treating Provider/Extender: Angie Fava Weeks in Treatment: 1 Information Obtained from: Patient Chief Complaint Bilateral lower extremity wounds Electronic Signature(s) Signed: 11/10/2020 3:43:02 PM By: Kalman Shan DO Entered By: Kalman Shan on 11/10/2020 15:39:03 -------------------------------------------------------------------------------- HPI Details Patient Name: Date of Service: [facility], [name] 11/08/2020 2:45 PM Medical Record Number: [id number] Patient Account Number: [id number] Date of Birth/Sex: Treating RN: 1945/03/11 (75 y.o. [name] Primary Care Provider: [name] Other Clinician: Referring Provider: Treating Provider/Extender: Angie Fava Weeks in Treatment: 1 History of Present Illness HPI Description: Admission 11/01/2020 [name] is a 75 year old male with a past medical history of controlled type 2 diabetes on oral medications, chronic venous insufficiency and persistent A. fib on Coumadin that presents to the clinic for A 1 year history of waxing and waning of bilateral lower extremity wounds. He states that his wounds opened up spontaneously. They have improved over the past 6 months. He was doing [employer] in August and September which helped heal his wounds. They have opened up again and he is using lotion and compression stockings. He currently denies signs of infection. 11/3; patient presents for follow-up. He has no issues or complaints today. He has been using his juxta light compression daily with Hydrofera  Blue underneath. He denies signs of infection. Electronic Signature(s) Signed: 11/10/2020 3:43:02 PM By: Kalman Shan DO Entered By: Kalman Shan on 11/10/2020 15:39:36 -------------------------------------------------------------------------------- Physical Exam Details Patient Name: Date of Service: [facility] [place], [name] 11/08/2020 2:45 PM Medical Record Number: [id number] Patient Account Number: [id number] Date of Birth/Sex: Treating RN: 12-17-1945 (75 y.o. [name] Primary Care Provider: Other Clinician: [name] Referring Provider: Treating Provider/Extender: Angie Fava Weeks in Treatment: 1 Constitutional respirations regular, non-labored and within target range for patient.. Cardiovascular 2+ dorsalis pedis/posterior tibialis pulses. Psychiatric pleasant and cooperative. Notes Small open wounds with granulation tissue to lower extremities bilaterally. No obvious signs of surrounding infection. No drainage noted. Venous stasis dermatitis bilaterally Electronic Signature(s) Signed: 11/10/2020 3:43:02 PM By: Kalman Shan DO Entered By: Kalman Shan on 11/10/2020 15:40:59 -------------------------------------------------------------------------------- Physician Orders Details Patient Name: Date of Service: [facility], [name] 11/08/2020 2:45 PM Medical Record Number: [id number] Patient Account Number: [id number] Date of Birth/Sex: Treating RN: Oct 04, 1945 (75 y.o. [name] Primary Care Provider: [name] Other Clinician: Referring Provider: Treating Provider/Extender: Angie Fava Weeks in Treatment: 1 Verbal / Phone Orders: No Diagnosis Coding Follow-up Appointments ppointment in 1 week. - Dr. Heber [place] Return A Someone will call you with an appt time with [facility] and vascular for arterial studies. Bathing/ Shower/ Hygiene May shower with protection but do not get wound dressing(s)  wet. Edema Control - Lymphedema / SCD / Other Elevate legs to the level of the heart or above for 30 minutes daily and/or when sitting, a frequency of: - 3-4 times a day throughout the day. Avoid standing for long periods of time. Exercise regularly Compression stocking or Garment 30-40 mm/Hg pressure to: - juxtalites both legs daily Wound Treatment Wound #1 - Lower Leg Wound Laterality: Right, Lateral Cleanser: Wound Cleanser 1 x Per Day/30 Days Discharge Instructions: Cleanse  the wound with wound cleanser prior to applying a clean dressing using gauze sponges, not tissue or cotton balls. Peri-Wound Care: Sween Lotion (Moisturizing lotion) 1 x Per Day/30 Days Discharge Instructions: Apply moisturizing lotion to legs with dressing changes Prim Dressing: Hydrofera Blue Ready Foam, 2.5 x2.5 in 1 x Per Day/30 Days ary Discharge Instructions: Apply to wound bed as instructed Secondary Dressing: Zetuvit Plus Silicone Border Dressing 4x4 (in/in) (DME) (Generic) 1 x Per Day/30 Days Discharge Instructions: Apply silicone border over primary dressing as directed. Compression Stockings: Circaid Juxta Lite Compression Wrap Right Leg Compression Amount: 30-40 mmHG Discharge Instructions: Apply Circaid Juxta Lite Compression Wrap daily as instructed. Apply first thing in the morning, remove at night before bed. Wound #3 - Malleolus Wound Laterality: Left, Medial Cleanser: Wound Cleanser 1 x Per Day/30 Days Discharge Instructions: Cleanse the wound with wound cleanser prior to applying a clean dressing using gauze sponges, not tissue or cotton balls. Peri-Wound Care: Sween Lotion (Moisturizing lotion) 1 x Per Day/30 Days Discharge Instructions: Apply moisturizing lotion to legs with dressing changes Prim Dressing: Hydrofera Blue Ready Foam, 2.5 x2.5 in 1 x Per Day/30 Days ary Discharge Instructions: Apply to wound bed as instructed Secondary Dressing: Zetuvit Plus Silicone Border Dressing 4x4 (in/in)  (DME) (Generic) 1 x Per Day/30 Days Discharge Instructions: Apply silicone border over primary dressing as directed. Electronic Signature(s) Signed: 11/10/2020 3:43:02 PM By: Kalman Shan DO Previous Signature: 11/08/2020 4:07:03 PM Version By: Baruch Gouty RN, BSN Entered By: Kalman Shan on 11/10/2020 15:41:24 -------------------------------------------------------------------------------- Problem List Details Patient Name: Date of Service: [facility], [name] 11/08/2020 2:45 PM Medical Record Number: [id number] Patient Account Number: [id number] Date of Birth/Sex: Treating RN: 02-Mar-1945 (75 y.o. Lorette Ang, Tammi Klippel Primary Care Provider: [name] Other Clinician: Referring Provider: Treating Provider/Extender: Angie Fava Weeks in Treatment: 1 Active Problems ICD-10 Encounter Code Description Active Date MDM Diagnosis L97.811 Non-pressure chronic ulcer of other part of right lower leg limited to breakdown 11/01/2020 No Yes of skin L97.921 Non-pressure chronic ulcer of unspecified part of left lower leg limited to 11/01/2020 No Yes breakdown of skin E11.622 Type 2 diabetes mellitus with other skin ulcer 11/01/2020 No Yes I87.2 Venous insufficiency (chronic) (peripheral) 11/01/2020 No Yes I89.0 Lymphedema, not elsewhere classified 11/01/2020 No Yes I48.19 Other persistent atrial fibrillation 11/01/2020 No Yes Z79.01 Long term (current) use of anticoagulants 11/01/2020 No Yes Inactive Problems Resolved Problems Electronic Signature(s) Signed: 11/10/2020 3:43:02 PM By: Kalman Shan DO Entered By: Kalman Shan on 11/10/2020 15:38:18 -------------------------------------------------------------------------------- Progress Note Details Patient Name: Date of Service: [name], [name] 11/08/2020 2:45 PM Medical Record Number: [id number] Patient Account Number: [id number] Date of Birth/Sex: Treating RN: December 17, 1945 (75 y.o. [name] Primary Care Provider: [name] Other Clinician: Referring Provider: Treating Provider/Extender: Angie Fava Weeks in Treatment: 1 Subjective Chief Complaint Information obtained from Patient Bilateral lower extremity wounds History of Present Illness (HPI) Admission 11/01/2020 Mr. Imer Foxworth is a 74 year old male with a past medical history of controlled type 2 diabetes on oral medications, chronic venous insufficiency and persistent A. fib on Coumadin that presents to the clinic for A 1 year history of waxing and waning of bilateral lower extremity wounds. He states that his wounds opened up spontaneously. They have improved over the past 6 months. He was doing [employer] in August and September which helped heal his wounds. They have opened up again and he is using lotion and compression stockings. He currently  denies signs of infection. 11/3; patient presents for follow-up. He has no issues or complaints today. He has been using his juxta light compression daily with Hydrofera Blue underneath. He denies signs of infection. Patient History Information obtained from Patient, Chart. Family History Cancer - Father, No family history of Diabetes, Heart Disease, Hereditary Spherocytosis, Hypertension, Kidney Disease, Lung Disease, Seizures, Stroke, Thyroid Problems, Tuberculosis. Social History Never smoker, Marital Status - Divorced, Alcohol Use - Never, Drug Use - No History, Caffeine Use - Never. Medical History Eyes Denies history of Cataracts, Glaucoma, Optic Neuritis Ear/Nose/Mouth/Throat Denies history of Chronic sinus problems/congestion, Middle ear problems Hematologic/Lymphatic Denies history of Anemia, Hemophilia, Human Immunodeficiency Virus, Lymphedema, Sickle Cell Disease Respiratory Patient has history of Sleep Apnea - CPAP Denies history of Aspiration, Asthma, Chronic Obstructive Pulmonary Disease (COPD), Pneumothorax,  Tuberculosis Cardiovascular Patient has history of Arrhythmia - A. Fib, Deep Vein Thrombosis - 2014, Hypertension Denies history of Angina, Congestive Heart Failure, Coronary Artery Disease, Hypotension, Myocardial Infarction, Peripheral Arterial Disease, Peripheral Venous Disease, Phlebitis, Vasculitis Gastrointestinal Denies history of Cirrhosis , Colitis, Crohnoos, Hepatitis A, Hepatitis B, Hepatitis C Endocrine Denies history of Type I Diabetes, Type II Diabetes Genitourinary Denies history of End Stage Renal Disease Immunological Denies history of Lupus Erythematosus, Raynaudoos, Scleroderma Integumentary (Skin) Denies history of History of Burn Musculoskeletal Denies history of Gout, Rheumatoid Arthritis, Osteoarthritis, Osteomyelitis Neurologic Denies history of Dementia, Neuropathy, Quadriplegia, Paraplegia, Seizure Disorder Oncologic Denies history of Received Chemotherapy, Received Radiation Psychiatric Denies history of Anorexia/bulimia, Confinement Anxiety Hospitalization/Surgery History - bilateral knee replacements 2010 and 2011. - 2014 DVT BLE. Medical A Surgical History Notes nd Cardiovascular thrombocytopenia dyslipidemia Endocrine Prediabetic Objective Constitutional respirations regular, non-labored and within target range for patient.. Vitals Time Taken: 3:24 PM, Height: 71 in, Source: Stated, Weight: 297 lbs, Source: Stated, BMI: 41.4, Temperature: 98.2 F, Pulse: 65 bpm, Respiratory Rate: 20 breaths/min, Blood Pressure: 144/86 mmHg. Cardiovascular 2+ dorsalis pedis/posterior tibialis pulses. Psychiatric pleasant and cooperative. General Notes: Small open wounds with granulation tissue to lower extremities bilaterally. No obvious signs of surrounding infection. No drainage noted. Venous stasis dermatitis bilaterally Integumentary (Hair, Skin) Wound #1 status is Open. Original cause of wound was Gradually Appeared. The date acquired was: 11/02/2019.  The wound has been in treatment 1 weeks. The wound is located on the Right,Lateral Lower Leg. The wound measures 0.8cm length x 0.8cm width x 0.1cm depth; 0.503cm^2 area and 0.05cm^3 volume. There is Fat Layer (Subcutaneous Tissue) exposed. There is no tunneling or undermining noted. There is a small amount of serosanguineous drainage noted. The wound margin is distinct with the outline attached to the wound base. There is large (67-100%) red granulation within the wound bed. There is no necrotic tissue within the wound bed. Wound #2 status is Open. Original cause of wound was Gradually Appeared. The date acquired was: 11/02/2019. The wound has been in treatment 1 weeks. The wound is located on the Right,Anterior Lower Leg. The wound measures 0cm length x 0cm width x 0cm depth; 0cm^2 area and 0cm^3 volume. There is no tunneling or undermining noted. There is a none present amount of drainage noted. The wound margin is distinct with the outline attached to the wound base. There is no granulation within the wound bed. There is no necrotic tissue within the wound bed. Wound #3 status is Open. Original cause of wound was Gradually Appeared. The date acquired was: 11/02/2019. The wound has been in treatment 1 weeks. The wound is located on the Left,Medial Malleolus. The wound  measures 0.1cm length x 1cm width x 0.1cm depth; 0.079cm^2 area and 0.008cm^3 volume. The wound is limited to skin breakdown. There is no tunneling or undermining noted. There is a none present amount of drainage noted. The wound margin is distinct with the outline attached to the wound base. There is no granulation within the wound bed. There is no necrotic tissue within the wound bed. Assessment Active Problems ICD-10 Non-pressure chronic ulcer of other part of right lower leg limited to breakdown of skin Non-pressure chronic ulcer of unspecified part of left lower leg limited to breakdown of skin Type 2 diabetes mellitus with  other skin ulcer Venous insufficiency (chronic) (peripheral) Lymphedema, not elsewhere classified Other persistent atrial fibrillation Long term (current) use of anticoagulants Patient's wounds have shown improvement in size and appearance and's last clinic visit. He would like to continue with his juxta light compression. I recommended using Hydrofera Blue under this. No signs of infection on exam. Follow-up in 1 week. Plan Follow-up Appointments: Return Appointment in 1 week. - Dr. Heber [place] Someone will call you with an appt time with [facility] and vascular for arterial studies. Bathing/ Shower/ Hygiene: May shower with protection but do not get wound dressing(s) wet. Edema Control - Lymphedema / SCD / Other: Elevate legs to the level of the heart or above for 30 minutes daily and/or when sitting, a frequency of: - 3-4 times a day throughout the day. Avoid standing for long periods of time. Exercise regularly Compression stocking or Garment 30-40 mm/Hg pressure to: - juxtalites both legs daily WOUND #1: - Lower Leg Wound Laterality: Right, Lateral Cleanser: Wound Cleanser 1 x Per Day/30 Days Discharge Instructions: Cleanse the wound with wound cleanser prior to applying a clean dressing using gauze sponges, not tissue or cotton balls. Peri-Wound Care: Sween Lotion (Moisturizing lotion) 1 x Per Day/30 Days Discharge Instructions: Apply moisturizing lotion to legs with dressing changes Prim Dressing: Hydrofera Blue Ready Foam, 2.5 x2.5 in 1 x Per Day/30 Days ary Discharge Instructions: Apply to wound bed as instructed Secondary Dressing: Zetuvit Plus Silicone Border Dressing 4x4 (in/in) (DME) (Generic) 1 x Per Day/30 Days Discharge Instructions: Apply silicone border over primary dressing as directed. Com pression Stockings: Circaid Juxta Lite Compression Wrap Compression Amount: 30-40 mmHg (right) Discharge Instructions: Apply Circaid Juxta Lite Compression Wrap daily as instructed. Apply  first thing in the morning, remove at night before bed. WOUND #3: - Malleolus Wound Laterality: Left, Medial Cleanser: Wound Cleanser 1 x Per Day/30 Days Discharge Instructions: Cleanse the wound with wound cleanser prior to applying a clean dressing using gauze sponges, not tissue or cotton balls. Peri-Wound Care: Sween Lotion (Moisturizing lotion) 1 x Per Day/30 Days Discharge Instructions: Apply moisturizing lotion to legs with dressing changes Prim Dressing: Hydrofera Blue Ready Foam, 2.5 x2.5 in 1 x Per Day/30 Days ary Discharge Instructions: Apply to wound bed as instructed Secondary Dressing: Zetuvit Plus Silicone Border Dressing 4x4 (in/in) (DME) (Generic) 1 x Per Day/30 Days Discharge Instructions: Apply silicone border over primary dressing as directed. 1. Hydrofera Blue under juxta lite compression 2. Follow-up in 1 week Electronic Signature(s) Signed: 11/10/2020 3:43:02 PM By: Kalman Shan DO Entered By: Kalman Shan on 11/10/2020 15:42:22 -------------------------------------------------------------------------------- HxROS Details Patient Name: Date of Service: [facility], [name] 11/08/2020 2:45 PM Medical Record Number: [id number] Patient Account Number: [id number] Date of Birth/Sex: Treating RN: 03-Aug-1945 (75 y.o. [name] Primary Care Provider: [name] Other Clinician: Referring Provider: Treating Provider/Extender: Ilda Foil  in Treatment: 1 Information Obtained From Patient Chart Eyes Medical History: Negative for: Cataracts; Glaucoma; Optic Neuritis Ear/Nose/Mouth/Throat Medical History: Negative for: Chronic sinus problems/congestion; Middle ear problems Hematologic/Lymphatic Medical History: Negative for: Anemia; Hemophilia; Human Immunodeficiency Virus; Lymphedema; Sickle Cell Disease Respiratory Medical History: Positive for: Sleep Apnea - CPAP Negative for: Aspiration; Asthma; Chronic Obstructive  Pulmonary Disease (COPD); Pneumothorax; Tuberculosis Cardiovascular Medical History: Positive for: Arrhythmia - A. Fib; Deep Vein Thrombosis - 2014; Hypertension Negative for: Angina; Congestive Heart Failure; Coronary Artery Disease; Hypotension; Myocardial Infarction; Peripheral Arterial Disease; Peripheral Venous Disease; Phlebitis; Vasculitis Past Medical History Notes: thrombocytopenia dyslipidemia Gastrointestinal Medical History: Negative for: Cirrhosis ; Colitis; Crohns; Hepatitis A; Hepatitis B; Hepatitis C Endocrine Medical History: Negative for: Type I Diabetes; Type II Diabetes Past Medical History Notes: Prediabetic Genitourinary Medical History: Negative for: End Stage Renal Disease Immunological Medical History: Negative for: Lupus Erythematosus; Raynauds; Scleroderma Integumentary (Skin) Medical History: Negative for: History of Burn Musculoskeletal Medical History: Negative for: Gout; Rheumatoid Arthritis; Osteoarthritis; Osteomyelitis Neurologic Medical History: Negative for: Dementia; Neuropathy; Quadriplegia; Paraplegia; Seizure Disorder Oncologic Medical History: Negative for: Received Chemotherapy; Received Radiation Psychiatric Medical History: Negative for: Anorexia/bulimia; Confinement Anxiety Immunizations Pneumococcal Vaccine: Received Pneumococcal Vaccination: Yes Received Pneumococcal Vaccination On or After 60th Birthday: Yes Implantable Devices No devices added Hospitalization / Surgery History Type of Hospitalization/Surgery bilateral knee replacements 2010 and 2011 2014 DVT BLE Family and Social History Cancer: Yes - Father; Diabetes: No; Heart Disease: No; Hereditary Spherocytosis: No; Hypertension: No; Kidney Disease: No; Lung Disease: No; Seizures: No; Stroke: No; Thyroid Problems: No; Tuberculosis: No; Never smoker; Marital Status - Divorced; Alcohol Use: Never; Drug Use: No History; Caffeine Use: Never; Financial Concerns: No;  Food, Clothing or Shelter Needs: No; Support System Lacking: No; Transportation Concerns: No Electronic Signature(s) Signed: 11/10/2020 3:43:02 PM By: Kalman Shan DO Signed: 11/10/2020 6:11:26 PM By: Deon Pilling RN, BSN Entered By: Kalman Shan on 11/10/2020 15:39:45 -------------------------------------------------------------------------------- SuperBill Details Patient Name: Date of Service: [facility], [name] 11/08/2020 Medical Record Number: [id number] Patient Account Number: [id number] Date of Birth/Sex: Treating RN: [birth year] (75 y.o. [name] Primary Care Provider: [name] Other Clinician: Referring Provider: Treating Provider/Extender: Angie Fava Weeks in Treatment: 1 Diagnosis Coding ICD-10 Codes Code Description [phone] Non-pressure chronic ulcer of other part of right lower leg limited to breakdown of skin L97.921 Non-pressure chronic ulcer of unspecified part of left lower leg limited to breakdown of skin E11.622 Type 2 diabetes mellitus with other skin ulcer I87.2 Venous insufficiency (chronic) (peripheral) I89.0 Lymphedema, not elsewhere classified I48.19 Other persistent atrial fibrillation Z79.01 Long term (current) use of anticoagulants Facility Procedures CPT4 Code: [id number] Description: 99214 - WOUND CARE VISIT-LEV 4 EST PT Modifier: Quantity: 1 Physician Procedures : CPT4 Code Description Modifier [id number] - WC PHYS LEVEL 3 - EST PT ICD-10 Diagnosis Description L97.811 Non-pressure chronic ulcer of other part of right lower leg limited to breakdown of skin L97.921 Non-pressure chronic ulcer of unspecified part  of left lower leg limited to breakdown of skin E11.622 Type 2 diabetes mellitus with other skin ulcer I87.2 Venous insufficiency (chronic) (peripheral) Quantity: 1 Electronic Signature(s) Signed: 11/10/2020 3:43:02 PM By: Kalman Shan DO Previous Signature: 11/08/2020 4:07:03 PM Version By:  Baruch Gouty RN, BSN Entered By: Kalman Shan on 11/10/2020 15:42:39

## 2020-11-15 ENCOUNTER — Other Ambulatory Visit: Payer: Self-pay

## 2020-11-15 ENCOUNTER — Encounter (HOSPITAL_BASED_OUTPATIENT_CLINIC_OR_DEPARTMENT_OTHER): Payer: Medicare Other | Admitting: Internal Medicine

## 2020-11-15 DIAGNOSIS — E11622 Type 2 diabetes mellitus with other skin ulcer: Secondary | ICD-10-CM

## 2020-11-15 DIAGNOSIS — I890 Lymphedema, not elsewhere classified: Secondary | ICD-10-CM

## 2020-11-15 DIAGNOSIS — I872 Venous insufficiency (chronic) (peripheral): Secondary | ICD-10-CM

## 2020-11-15 DIAGNOSIS — L97811 Non-pressure chronic ulcer of other part of right lower leg limited to breakdown of skin: Secondary | ICD-10-CM | POA: Diagnosis not present

## 2020-11-15 DIAGNOSIS — I4819 Other persistent atrial fibrillation: Secondary | ICD-10-CM | POA: Diagnosis not present

## 2020-11-15 DIAGNOSIS — L97921 Non-pressure chronic ulcer of unspecified part of left lower leg limited to breakdown of skin: Secondary | ICD-10-CM | POA: Diagnosis not present

## 2020-11-16 NOTE — Progress Notes (Signed)
ANDRANIK, JEUNE ([id number]) Visit Report for 11/15/2020 Arrival Information Details Patient Name: Date of Service: [facility] 11/15/2020 11:00 A M Medical Record Number: [id number] Patient Account Number: [id number] Date of Birth/Sex: Treating RN: 27-Jan-1945 (75 y.o. Burnadette Pop, Lauren Primary Care Maebell Lyvers: [name] Other Clinician: Referring Rhen Kawecki: Treating Braxtyn Dorff/Extender: Angie Fava Weeks in Treatment: 2 Visit Information History Since Last Visit Added or deleted any medications: No Patient Arrived: Kasandra Knudsen Any new allergies or adverse reactions: No Arrival Time: 11:35 Had a fall or experienced change in No Accompanied By: self activities of daily living that may affect Transfer Assistance: None risk of falls: Patient Identification Verified: Yes Signs or symptoms of abuse/neglect since last visito No Secondary Verification Process Completed: Yes Hospitalized since last visit: No Patient Requires Transmission-Based Precautions: No Implantable device outside of the clinic excluding No Patient Has Alerts: Yes cellular tissue based products placed in the center Patient Alerts: Patient on Blood Thinner since last visit: Has Dressing in Place as Prescribed: Yes Pain Present Now: No Electronic Signature(s) Signed: 11/15/2020 4:45:49 PM By: Rhae Hammock RN Entered By: Rhae Hammock on 11/15/2020 11:36:01 -------------------------------------------------------------------------------- Clinic Level of Care Assessment Details Patient Name: Date of Service: [facility], [name] 11/15/2020 11:00 A M Medical Record Number: [id number] Patient Account Number: [id number] Date of Birth/Sex: Treating RN: 1945-07-21 (75 y.o. Burnadette Pop, Lauren Primary Care Terasa Orsini: [name] Other Clinician: Referring Sharad Vaneaton: Treating Makaelah Cranfield/Extender: Angie Fava Weeks in Treatment: 2 Clinic Level of Care Assessment  Items TOOL 4 Quantity Score X- 1 0 Use when only an EandM is performed on FOLLOW-UP visit ASSESSMENTS - Nursing Assessment / Reassessment X- 1 10 Reassessment of Co-morbidities (includes updates in patient status) X- 1 5 Reassessment of Adherence to Treatment Plan ASSESSMENTS - Wound and Skin A ssessment / Reassessment []  - 0 Simple Wound Assessment / Reassessment - one wound X- 2 5 Complex Wound Assessment / Reassessment - multiple wounds []  - 0 Dermatologic / Skin Assessment (not related to wound area) ASSESSMENTS - Focused Assessment X- 1 5 Circumferential Edema Measurements - multi extremities []  - 0 Nutritional Assessment / Counseling / Intervention []  - 0 Lower Extremity Assessment (monofilament, tuning fork, pulses) []  - 0 Peripheral Arterial Disease Assessment (using hand held doppler) ASSESSMENTS - Ostomy and/or Continence Assessment and Care []  - 0 Incontinence Assessment and Management []  - 0 Ostomy Care Assessment and Management (repouching, etc.) PROCESS - Coordination of Care []  - 0 Simple Patient / Family Education for ongoing care X- 1 20 Complex (extensive) Patient / Family Education for ongoing care X- 1 10 Staff obtains Programmer, systems, Records, T Results / Process Orders est []  - 0 Staff telephones HHA, Nursing Homes / Clarify orders / etc []  - 0 Routine Transfer to another Facility (non-emergent condition) []  - 0 Routine Hospital Admission (non-emergent condition) []  - 0 New Admissions / Biomedical engineer / Ordering NPWT Apligraf, etc. , []  - 0 Emergency Hospital Admission (emergent condition) X- 1 10 Simple Discharge Coordination []  - 0 Complex (extensive) Discharge Coordination PROCESS - Special Needs []  - 0 Pediatric / Minor Patient Management []  - 0 Isolation Patient Management []  - 0 Hearing / Language / Visual special needs []  - 0 Assessment of Community assistance (transportation, D/C planning, etc.) []  - 0 Additional  assistance / Altered mentation []  - 0 Support Surface(s) Assessment (bed, cushion, seat, etc.) INTERVENTIONS - Wound Cleansing / Measurement []  - 0 Simple Wound Cleansing - one wound X-  2 5 Complex Wound Cleansing - multiple wounds X- 1 5 Wound Imaging (photographs - any number of wounds) []  - 0 Wound Tracing (instead of photographs) []  - 0 Simple Wound Measurement - one wound X- 2 5 Complex Wound Measurement - multiple wounds INTERVENTIONS - Wound Dressings []  - 0 Small Wound Dressing one or multiple wounds X- 1 15 Medium Wound Dressing one or multiple wounds []  - 0 Large Wound Dressing one or multiple wounds X- 1 5 Application of Medications - topical []  - 0 Application of Medications - injection INTERVENTIONS - Miscellaneous []  - 0 External ear exam []  - 0 Specimen Collection (cultures, biopsies, blood, body fluids, etc.) []  - 0 Specimen(s) / Culture(s) sent or taken to Lab for analysis []  - 0 Patient Transfer (multiple staff / Civil Service fast streamer / Similar devices) []  - 0 Simple Staple / Suture removal (25 or less) []  - 0 Complex Staple / Suture removal (26 or more) []  - 0 Hypo / Hyperglycemic Management (close monitor of Blood Glucose) []  - 0 Ankle / Brachial Index (ABI) - do not check if billed separately X- 1 5 Vital Signs Has the patient been seen at the hospital within the last three years: Yes Total Score: 120 Level Of Care: New/Established - Level 4 Electronic Signature(s) Signed: 11/15/2020 4:45:49 PM By: Rhae Hammock RN Entered By: Rhae Hammock on 11/15/2020 12:01:21 -------------------------------------------------------------------------------- Encounter Discharge Information Details Patient Name: Date of Service: [facility], [name] 11/15/2020 11:00 A M Medical Record Number: [id number] Patient Account Number: [id number] Date of Birth/Sex: Treating RN: March 10, 1945 (75 y.o. Burnadette Pop, Lauren Primary Care Izyk Marty: [name] Other  Clinician: Referring Rehan Holness: Treating Jamesyn Lindell/Extender: Ilda Foil in Treatment: 2 Encounter Discharge Information Items Discharge Condition: Stable Ambulatory Status: Cane Discharge Destination: Home Transportation: Private Auto Accompanied By: self Schedule Follow-up Appointment: Yes Clinical Summary of Care: Patient Declined Electronic Signature(s) Signed: 11/15/2020 4:45:49 PM By: Rhae Hammock RN Entered By: Rhae Hammock on 11/15/2020 12:02:09 -------------------------------------------------------------------------------- Lower Extremity Assessment Details Patient Name: Date of Service: [facility], [name] 11/15/2020 11:00 A M Medical Record Number: [id number] Patient Account Number: [id number] Date of Birth/Sex: Treating RN: 1945/06/28 (75 y.o. Burnadette Pop, Lauren Primary Care Belinda Schlichting: [name] Other Clinician: Referring Mikella Linsley: Treating Zosia Lucchese/Extender: Angie Fava Weeks in Treatment: 2 Edema Assessment Assessed: Shirlyn Goltz: Yes] Patrice Paradise: Yes] Edema: [Left: Yes] [Right: Yes] Calf Left: Right: Point of Measurement: 33 cm From Medial Instep 43.2 cm 42 cm Ankle Left: Right: Point of Measurement: 11 cm From Medial Instep 26 cm 25.5 cm Vascular Assessment Pulses: Dorsalis Pedis Palpable: [Left:Yes] [Right:Yes] Posterior Tibial Palpable: [Left:Yes] [Right:Yes] Electronic Signature(s) Signed: 11/15/2020 4:45:49 PM By: Rhae Hammock RN Entered By: Rhae Hammock on 11/15/2020 11:45:51 -------------------------------------------------------------------------------- Multi Wound Chart Details Patient Name: Date of Service: [facility], [name] 11/15/2020 11:00 A M Medical Record Number: [id number] Patient Account Number: [id number] Date of Birth/Sex: Treating RN: [birth year] (75 y.o. [name] Primary Care Anagha Loseke: [name] Other Clinician: Referring Kori Goins: Treating  Jaiyden Laur/Extender: Angie Fava Weeks in Treatment: 2 Vital Signs Height(in): 71 Pulse(bpm): 61 Weight(lbs): 297 Blood Pressure(mmHg): 145/74 Body Mass Index(BMI): 41 Temperature(F): 98.4 Respiratory Rate(breaths/min): 17 Photos: [N/A:N/A] Right, Lateral Lower Leg Left, Medial Malleolus N/A Wound Location: Gradually Appeared Gradually Appeared N/A Wounding Event: Venous Leg Ulcer Venous Leg Ulcer N/A Primary Etiology: Lymphedema Lymphedema N/A Secondary Etiology: Sleep Apnea, Arrhythmia, Deep Vein Sleep Apnea, Arrhythmia, Deep Vein N/A Comorbid History: Thrombosis, Hypertension Thrombosis, Hypertension  11/02/2019 11/02/2019 N/A Date Acquired: 2 2 N/A Weeks of Treatment: Open Healed - Epithelialized N/A Wound Status: 0.7x1.4x0.1 0x0x0 N/A Measurements L x W x D (cm) 0.77 0 N/A A (cm) : rea 0.077 0 N/A Volume (cm) : 25.70% 100.00% N/A % Reduction in Area: 26.00% 100.00% N/A % Reduction in Volume: Full Thickness Without Exposed Full Thickness Without Exposed N/A Classification: Support Structures Support Structures Small None Present N/A Exudate Amount: Serosanguineous N/A N/A Exudate Type: red, brown N/A N/A Exudate Color: Distinct, outline attached N/A N/A Wound Margin: Large (67-100%) N/A N/A Granulation Amount: Red N/A N/A Granulation Quality: None Present (0%) N/A N/A Necrotic Amount: Fat Layer (Subcutaneous Tissue): Yes N/A N/A Exposed Structures: Fascia: No Tendon: No Muscle: No Joint: No Bone: No Small (1-33%) N/A N/A Epithelialization: Treatment Notes Electronic Signature(s) Signed: 11/15/2020 12:07:01 PM By: Kalman Shan DO Signed: 11/16/2020 5:29:58 PM By: Deon Pilling RN, BSN Entered By: Kalman Shan on 11/15/2020 11:57:22 -------------------------------------------------------------------------------- [place] Details Patient Name: Date of Service: [facility], [name] 11/15/2020  11:00 A M Medical Record Number: [id number] Patient Account Number: [id number] Date of Birth/Sex: Treating RN: 1945/10/02 (75 y.o. Burnadette Pop, Lauren Primary Care Kasper Mudrick: [name] Other Clinician: Referring Lucindia Lemley: Treating Basir Niven/Extender: Angie Fava Weeks in Treatment: 2 Active Inactive Wound/Skin Impairment Nursing Diagnoses: Knowledge deficit related to ulceration/compromised skin integrity Goals: Patient/caregiver will verbalize understanding of skin care regimen Date Initiated: 11/01/2020 Target Resolution Date: 12/06/2020 Goal Status: Active Interventions: Assess patient/caregiver ability to perform ulcer/skin care regimen upon admission and as needed Assess ulceration(s) every visit Provide education on ulcer and skin care Treatment Activities: Skin care regimen initiated : 11/01/2020 Topical wound management initiated : 11/01/2020 Notes: Electronic Signature(s) Signed: 11/15/2020 4:45:49 PM By: Rhae Hammock RN Entered By: Rhae Hammock on 11/15/2020 12:00:24 -------------------------------------------------------------------------------- Pain Assessment Details Patient Name: Date of Service: [facility], [name] 11/15/2020 11:00 A M Medical Record Number: [id number] Patient Account Number: [id number] Date of Birth/Sex: Treating RN: [birth year] (75 y.o. Burnadette Pop, Lauren Primary Care Jamas Jaquay: [name] Other Clinician: Referring Vincenza Dail: Treating Kendrea Cerritos/Extender: Angie Fava Weeks in Treatment: 2 Active Problems Location of Pain Severity and Description of Pain Patient Has Paino No Patient Has Paino No Site Locations Pain Management and Medication Current Pain Management: Electronic Signature(s) Signed: 11/15/2020 4:45:49 PM By: Rhae Hammock RN Entered By: Rhae Hammock on 11/15/2020  11:37:57 -------------------------------------------------------------------------------- Patient/Caregiver Education Details Patient Name: Date of Service: [facility], HA RV Feliz Beam 11/8/2022andnbsp11:00 [place] Record Number: [id number] Patient Account Number: [id number] Date of Birth/Gender: Treating RN: Apr 23, 1945 (75 y.o. Erie Noe Primary Care Physician: [name] Other Clinician: Referring Physician: Treating Physician/Extender: Ilda Foil in Treatment: 2 Education Assessment Education Provided To: Patient Education Topics Provided Wound/Skin Impairment: Methods: Explain/Verbal Responses: Reinforcements needed, State content correctly Electronic Signature(s) Signed: 11/15/2020 4:45:49 PM By: Rhae Hammock RN Entered By: Rhae Hammock on 11/15/2020 12:00:42 -------------------------------------------------------------------------------- Wound Assessment Details Patient Name: Date of Service: [facility], [name] 11/15/2020 11:00 A M Medical Record Number: [id number] Patient Account Number: [id number] Date of Birth/Sex: Treating RN: [birth year] (75 y.o. Burnadette Pop, Lauren Primary Care Moet Mikulski: [name] Other Clinician: Referring Shakiera Edelson: Treating Arya Luttrull/Extender: Angie Fava Weeks in Treatment: 2 Wound Status Wound Number: 1 Primary Etiology: Venous Leg Ulcer Wound Location: Right, Lateral Lower Leg Secondary Lymphedema Etiology: Wounding Event: Gradually Appeared Wound Status: Open Date Acquired: 11/02/2019 Comorbid History: Sleep Apnea, Arrhythmia, Deep Vein Thrombosis, Weeks  Of Treatment: 2 Hypertension Clustered Wound: No Photos Wound Measurements Length: (cm) 0.7 Width: (cm) 1.4 Depth: (cm) 0.1 Area: (cm) 0.77 Volume: (cm) 0.077 % Reduction in Area: 25.7% % Reduction in Volume: 26% Epithelialization: Small (1-33%) Tunneling: No Undermining: No Wound  Description Classification: Full Thickness Without Exposed Support Structures Wound Margin: Distinct, outline attached Exudate Amount: Small Exudate Type: Serosanguineous Exudate Color: red, brown Foul Odor After Cleansing: No Slough/Fibrino No Wound Bed Granulation Amount: Large (67-100%) Exposed Structure Granulation Quality: Red Fascia Exposed: No Necrotic Amount: None Present (0%) Fat Layer (Subcutaneous Tissue) Exposed: Yes Tendon Exposed: No Muscle Exposed: No Joint Exposed: No Bone Exposed: No Treatment Notes Wound #1 (Lower Leg) Wound Laterality: Right, Lateral Cleanser Wound Cleanser Discharge Instruction: Cleanse the wound with wound cleanser prior to applying a clean dressing using gauze sponges, not tissue or cotton balls. Peri-Wound Care Sween Lotion (Moisturizing lotion) Discharge Instruction: Apply moisturizing lotion to legs with dressing changes Topical Primary Dressing Promogran Prisma Matrix, 4.34 (sq in) (silver collagen) Discharge Instruction: Moisten collagen with saline or hydrogel Secondary Dressing Zetuvit Plus Silicone Border Dressing 4x4 (in/in) Discharge Instruction: Apply silicone border over primary dressing as directed. Secured With Compression Wrap Compression Stockings Circaid Juxta Lite Compression Wrap Quantity: 1 Right Leg Compression Amount: 30-40 mmHg Discharge Instruction: Apply Circaid Juxta Lite Compression Wrap daily as instructed. Apply first thing in the morning, remove at night before bed. Add-Ons Electronic Signature(s) Signed: 11/15/2020 4:45:49 PM By: Rhae Hammock RN Entered By: Rhae Hammock on 11/15/2020 11:44:12 -------------------------------------------------------------------------------- Wound Assessment Details Patient Name: Date of Service: [facility], [name] 11/15/2020 11:00 A M Medical Record Number: [id number] Patient Account Number: [id number] Date of Birth/Sex: Treating RN: [birth year] (75 y.o.  Burnadette Pop, Lauren Primary Care Osmani Kersten: [name] Other Clinician: Referring Froylan Hobby: Treating Sheryl Saintil/Extender: Angie Fava Weeks in Treatment: 2 Wound Status Wound Number: 3 Primary Etiology: Venous Leg Ulcer Wound Location: Left, Medial Malleolus Secondary Lymphedema Etiology: Wounding Event: Gradually Appeared Wound Status: Healed - Epithelialized Date Acquired: 11/02/2019 Comorbid History: Sleep Apnea, Arrhythmia, Deep Vein Thrombosis, Weeks Of Treatment: 2 Hypertension Clustered Wound: No Photos Wound Measurements Length: (cm) 0 Width: (cm) 0 Depth: (cm) 0 Area: (cm) 0 Volume: (cm) 0 % Reduction in Area: 100% % Reduction in Volume: 100% Wound Description Classification: Full Thickness Without Exposed Support Structu Exudate Amount: None Present res Treatment Notes Wound #3 (Malleolus) Wound Laterality: Left, Medial Cleanser Peri-Wound Care Topical Primary Dressing Secondary Dressing Secured With Compression Wrap Compression Stockings Add-Ons Electronic Signature(s) Signed: 11/15/2020 4:45:49 PM By: Rhae Hammock RN Entered By: Rhae Hammock on 11/15/2020 11:44:53 -------------------------------------------------------------------------------- Vitals Details Patient Name: Date of Service: [facility], [name] 11/15/2020 11:00 A M Medical Record Number: [id number] Patient Account Number: [id number] Date of Birth/Sex: Treating RN: 09/23/1945 (75 y.o. Burnadette Pop, Lauren Primary Care Madilyn Cephas: [name] Other Clinician: Referring Vaniya Augspurger: Treating Majesti Gambrell/Extender: Angie Fava Weeks in Treatment: 2 Vital Signs Time Taken: 11:36 Temperature (F): 98.4 Height (in): 71 Pulse (bpm): 74 Weight (lbs): 297 Respiratory Rate (breaths/min): 17 Body Mass Index (BMI): 41.4 Blood Pressure (mmHg): 145/74 Reference Range: 80 - 120 mg / dl Electronic Signature(s) Signed: 11/15/2020 4:45:49 PM By:  Rhae Hammock RN Entered By: Rhae Hammock on 11/15/2020 11:37:48

## 2020-11-16 NOTE — Progress Notes (Signed)
Brett [name] ([id number]) Visit Report for 11/15/2020 Chief Complaint Document Details Patient Name: Date of Service: [facility] 11/15/2020 11:00 A M Medical Record Number: [id number] Patient Account Number: [id number] Date of Birth/Sex: Treating RN: 1945/11/13 (75 y.o. Brett [name] Primary Care Provider: [name] Other Clinician: Referring Provider: Treating Provider/Extender: Brett [name] Weeks in Treatment: 2 Information Obtained from: Patient Chief Complaint Bilateral lower extremity wounds Electronic Signature(s) Signed: 11/15/2020 12:07:01 PM By: Kalman Shan DO Entered By: Kalman Shan on 11/15/2020 11:57:35 -------------------------------------------------------------------------------- HPI Details Patient Name: Date of Service: [facility], [name] 11/15/2020 11:00 A M Medical Record Number: [id number] Patient Account Number: [id number] Date of Birth/Sex: Treating RN: [birth year] (75 y.o. Brett [name] Primary Care Provider: [name] Other Clinician: Referring Provider: Treating Provider/Extender: Brett [name] Weeks in Treatment: 2 History of Present Illness HPI Description: Admission 11/01/2020 [name] is a 75 year old male with a past medical history of controlled type 2 diabetes on oral medications, chronic venous insufficiency and persistent A. fib on Coumadin that presents to the clinic for A 1 year history of waxing and waning of bilateral lower extremity wounds. He states that his wounds opened up spontaneously. They have improved over the past 6 months. He was doing [employer] in August and September which helped heal his wounds. They have opened up again and he is using lotion and compression stockings. He currently denies signs of infection. 11/3; patient presents for follow-up. He has no issues or complaints today. He has been using his juxta light compression daily with  Hydrofera Blue underneath. He denies signs of infection. 11/8; patient presents for follow-up. He states that on removal of the Hydrofera Blue dressing the skin tore due to being tightly adhered to the wound bed. He has been using his juxta light compressions daily. He denies signs of infection. Electronic Signature(s) Signed: 11/15/2020 12:07:01 PM By: Kalman Shan DO Entered By: Kalman Shan on 11/15/2020 11:58:37 -------------------------------------------------------------------------------- Physical Exam Details Patient Name: Date of Service: [facility], [name] 11/15/2020 11:00 A M Medical Record Number: [id number] Patient Account Number: [id number] Date of Birth/Sex: Treating RN: 1945-06-25 (75 y.o. Brett [name] Primary Care Provider: [name] Other Clinician: Referring Provider: Treating Provider/Extender: Brett [name] Weeks in Treatment: 2 Constitutional respirations regular, non-labored and within target range for patient.. Cardiovascular 2+ dorsalis pedis/posterior tibialis pulses. Psychiatric pleasant and cooperative. Notes Open wound to the right lower extremity limited to skin breakdown. Epithelialization to previous wound on the left lower extremity. No drainage noted. No signs of infection. Venous stasis dermatitis bilaterally. Electronic Signature(s) Signed: 11/15/2020 12:07:01 PM By: Kalman Shan DO Entered By: Kalman Shan on 11/15/2020 11:59:33 -------------------------------------------------------------------------------- Physician Orders Details Patient Name: Date of Service: [facility], [name] 11/15/2020 11:00 A M Medical Record Number: [id number] Patient Account Number: [id number] Date of Birth/Sex: Treating RN: 12-Nov-1945 (75 y.o. Brett [name], Brett [name] Primary Care Provider: [name] Other Clinician: Referring Provider: Treating Provider/Extender: Brett [name] Weeks in Treatment:  2 Verbal / Phone Orders: No Diagnosis Coding ICD-10 Coding Code Description L97.811 Non-pressure chronic ulcer of other part of right lower leg limited to breakdown of skin L97.921 Non-pressure chronic ulcer of unspecified part of left lower leg limited to breakdown of skin E11.622 Type 2 diabetes mellitus with other skin ulcer I87.2 Venous insufficiency (chronic) (peripheral) I89.0 Lymphedema, not elsewhere classified I48.19 Other persistent atrial fibrillation Z79.01 Long term (current) use of anticoagulants  Follow-up Appointments ppointment in 1 week. - Dr. Heber [place] Return A Other: - VVS appt. this Friday Bathing/ Shower/ Hygiene May shower with protection but do not get wound dressing(s) wet. Edema Control - Lymphedema / SCD / Other Elevate legs to the level of the heart or above for 30 minutes daily and/or when sitting, a frequency of: - 3-4 times a day throughout the day. Avoid standing for long periods of time. Exercise regularly Compression stocking or Garment 30-40 mm/Hg pressure to: - juxtalites both legs daily Wound Treatment Wound #1 - Lower Leg Wound Laterality: Right, Lateral Cleanser: Wound Cleanser 1 x Per Day/30 Days Discharge Instructions: Cleanse the wound with wound cleanser prior to applying a clean dressing using gauze sponges, not tissue or cotton balls. Peri-Wound Care: Sween Lotion (Moisturizing lotion) 1 x Per Day/30 Days Discharge Instructions: Apply moisturizing lotion to legs with dressing changes Prim Dressing: Promogran Prisma Matrix, 4.34 (sq in) (silver collagen) 1 x Per Day/30 Days ary Discharge Instructions: Moisten collagen with saline or hydrogel Secondary Dressing: Zetuvit Plus Silicone Border Dressing 4x4 (in/in) (Generic) 1 x Per Day/30 Days Discharge Instructions: Apply silicone border over primary dressing as directed. Compression Stockings: Circaid Juxta Lite Compression Wrap Right Leg Compression Amount: 30-40 mmHG Discharge Instructions:  Apply Circaid Juxta Lite Compression Wrap daily as instructed. Apply first thing in the morning, remove at night before bed. Electronic Signature(s) Signed: 11/15/2020 12:07:01 PM By: Kalman Shan DO Entered By: Kalman Shan on 11/15/2020 11:59:50 -------------------------------------------------------------------------------- Problem List Details Patient Name: Date of Service: [facility], [name] 11/15/2020 11:00 A M Medical Record Number: [id number] Patient Account Number: [id number] Date of Birth/Sex: Treating RN: [birth year] (75 y.o. Lorette Ang, Tammi Klippel Primary Care Provider: [name] Other Clinician: Referring Provider: Treating Provider/Extender: Brett [name] Weeks in Treatment: 2 Active Problems ICD-10 Encounter Code Description Active Date MDM Diagnosis L97.811 Non-pressure chronic ulcer of other part of right lower leg limited to breakdown 11/01/2020 No Yes of skin L97.921 Non-pressure chronic ulcer of unspecified part of left lower leg limited to 11/01/2020 No Yes breakdown of skin E11.622 Type 2 diabetes mellitus with other skin ulcer 11/01/2020 No Yes I87.2 Venous insufficiency (chronic) (peripheral) 11/01/2020 No Yes I89.0 Lymphedema, not elsewhere classified 11/01/2020 No Yes I48.19 Other persistent atrial fibrillation 11/01/2020 No Yes Z79.01 Long term (current) use of anticoagulants 11/01/2020 No Yes Inactive Problems Resolved Problems Electronic Signature(s) Signed: 11/15/2020 12:07:01 PM By: Kalman Shan DO Entered By: Kalman Shan on 11/15/2020 11:57:14 -------------------------------------------------------------------------------- Progress Note Details Patient Name: Date of Service: [facility], [name] 11/15/2020 11:00 A M Medical Record Number: [id number] Patient Account Number: [id number] Date of Birth/Sex: Treating RN: May 01, 1945 (75 y.o. Brett [name] Primary Care Provider: [name] Other  Clinician: Referring Provider: Treating Provider/Extender: Brett [name] Weeks in Treatment: 2 Subjective Chief Complaint Information obtained from Patient Bilateral lower extremity wounds History of Present Illness (HPI) Admission 11/01/2020 Mr. Othman Masur is a 75 year old male with a past medical history of controlled type 2 diabetes on oral medications, chronic venous insufficiency and persistent A. fib on Coumadin that presents to the clinic for A 1 year history of waxing and waning of bilateral lower extremity wounds. He states that his wounds opened up spontaneously. They have improved over the past 6 months. He was doing [employer] in August and September which helped heal his wounds. They have opened up again and he is using lotion and compression stockings. He currently denies signs of infection. 11/3; patient  presents for follow-up. He has no issues or complaints today. He has been using his juxta light compression daily with Hydrofera Blue underneath. He denies signs of infection. 11/8; patient presents for follow-up. He states that on removal of the Hydrofera Blue dressing the skin tore due to being tightly adhered to the wound bed. He has been using his juxta light compressions daily. He denies signs of infection. Patient History Information obtained from Patient, Chart. Family History Cancer - Father, No family history of Diabetes, Heart Disease, Hereditary Spherocytosis, Hypertension, Kidney Disease, Lung Disease, Seizures, Stroke, Thyroid Problems, Tuberculosis. Social History Never smoker, Marital Status - Divorced, Alcohol Use - Never, Drug Use - No History, Caffeine Use - Never. Medical History Eyes Denies history of Cataracts, Glaucoma, Optic Neuritis Ear/Nose/Mouth/Throat Denies history of Chronic sinus problems/congestion, Middle ear problems Hematologic/Lymphatic Denies history of Anemia, Hemophilia, Human Immunodeficiency Virus,  Lymphedema, Sickle Cell Disease Respiratory Patient has history of Sleep Apnea - CPAP Denies history of Aspiration, Asthma, Chronic Obstructive Pulmonary Disease (COPD), Pneumothorax, Tuberculosis Cardiovascular Patient has history of Arrhythmia - A. Fib, Deep Vein Thrombosis - 2014, Hypertension Denies history of Angina, Congestive Heart Failure, Coronary Artery Disease, Hypotension, Myocardial Infarction, Peripheral Arterial Disease, Peripheral Venous Disease, Phlebitis, Vasculitis Gastrointestinal Denies history of Cirrhosis , Colitis, Crohnoos, Hepatitis A, Hepatitis B, Hepatitis C Endocrine Denies history of Type I Diabetes, Type II Diabetes Genitourinary Denies history of End Stage Renal Disease Immunological Denies history of Lupus Erythematosus, Raynaudoos, Scleroderma Integumentary (Skin) Denies history of History of Burn Musculoskeletal Denies history of Gout, Rheumatoid Arthritis, Osteoarthritis, Osteomyelitis Neurologic Denies history of Dementia, Neuropathy, Quadriplegia, Paraplegia, Seizure Disorder Oncologic Denies history of Received Chemotherapy, Received Radiation Psychiatric Denies history of Anorexia/bulimia, Confinement Anxiety Hospitalization/Surgery History - bilateral knee replacements 2010 and 2011. - 2014 DVT BLE. Medical A Surgical History Notes nd Cardiovascular thrombocytopenia dyslipidemia Endocrine Prediabetic Objective Constitutional respirations regular, non-labored and within target range for patient.. Vitals Time Taken: 11:36 AM, Height: 71 in, Weight: 297 lbs, BMI: 41.4, Temperature: 98.4 F, Pulse: 74 bpm, Respiratory Rate: 17 breaths/min, Blood Pressure: 145/74 mmHg. Cardiovascular 2+ dorsalis pedis/posterior tibialis pulses. Psychiatric pleasant and cooperative. General Notes: Open wound to the right lower extremity limited to skin breakdown. Epithelialization to previous wound on the left lower extremity. No drainage noted. No  signs of infection. Venous stasis dermatitis bilaterally. Integumentary (Hair, Skin) Wound #1 status is Open. Original cause of wound was Gradually Appeared. The date acquired was: 11/02/2019. The wound has been in treatment 2 weeks. The wound is located on the Right,Lateral Lower Leg. The wound measures 0.7cm length x 1.4cm width x 0.1cm depth; 0.77cm^2 area and 0.077cm^3 volume. There is Fat Layer (Subcutaneous Tissue) exposed. There is no tunneling or undermining noted. There is a small amount of serosanguineous drainage noted. The wound margin is distinct with the outline attached to the wound base. There is large (67-100%) red granulation within the wound bed. There is no necrotic tissue within the wound bed. Wound #3 status is Healed - Epithelialized. Original cause of wound was Gradually Appeared. The date acquired was: 11/02/2019. The wound has been in treatment 2 weeks. The wound is located on the Left,Medial Malleolus. The wound measures 0cm length x 0cm width x 0cm depth; 0cm^2 area and 0cm^3 volume. There is a none present amount of drainage noted. Assessment Active Problems ICD-10 Non-pressure chronic ulcer of other part of right lower leg limited to breakdown of skin Non-pressure chronic ulcer of unspecified part of left lower leg limited to breakdown  of skin Type 2 diabetes mellitus with other skin ulcer Venous insufficiency (chronic) (peripheral) Lymphedema, not elsewhere classified Other persistent atrial fibrillation Long term (current) use of anticoagulants Patient's wound is slightly larger due to tearing from the wound dressing. I recommended switching to collagen and moistening this with normal saline. I recommended continuing to use juxta light compression daily. I rediscussed doing in office compression wrap. He is hesitant to do this today. If by next visit he has not improved he seems to be agreeable in revisitng the idea. We discussed the potential of worsening wounds  due to not having the in office wraps. He expressed understanding. Plan Follow-up Appointments: Return Appointment in 1 week. - Dr. Heber [place] Other: - [facility]VS appt. this Friday Bathing/ Shower/ Hygiene: May shower with protection but do not get wound dressing(s) wet. Edema Control - Lymphedema / SCD / Other: Elevate legs to the level of the heart or above for 30 minutes daily and/or when sitting, a frequency of: - 3-4 times a day throughout the day. Avoid standing for long periods of time. Exercise regularly Compression stocking or Garment 30-40 mm/Hg pressure to: - juxtalites both legs daily WOUND #1: - Lower Leg Wound Laterality: Right, Lateral Cleanser: Wound Cleanser 1 x Per Day/30 Days Discharge Instructions: Cleanse the wound with wound cleanser prior to applying a clean dressing using gauze sponges, not tissue or cotton balls. Peri-Wound Care: Sween Lotion (Moisturizing lotion) 1 x Per Day/30 Days Discharge Instructions: Apply moisturizing lotion to legs with dressing changes Prim Dressing: Promogran Prisma Matrix, 4.34 (sq in) (silver collagen) 1 x Per Day/30 Days ary Discharge Instructions: Moisten collagen with saline or hydrogel Secondary Dressing: Zetuvit Plus Silicone Border Dressing 4x4 (in/in) (Generic) 1 x Per Day/30 Days Discharge Instructions: Apply silicone border over primary dressing as directed. Com pression Stockings: Circaid Juxta Lite Compression Wrap Compression Amount: 30-40 mmHg (right) Discharge Instructions: Apply Circaid Juxta Lite Compression Wrap daily as instructed. Apply first thing in the morning, remove at night before bed. 1. Collagen 2. Juxta light compression 3. Follow-up in 1 week Electronic Signature(s) Signed: 11/15/2020 12:07:01 PM By: Kalman Shan DO Entered By: Kalman Shan on 11/15/2020 12:06:22 -------------------------------------------------------------------------------- HxROS Details Patient Name: Date of Service: [facility], HA  RV EY L. 11/15/2020 11:00 A M Medical Record Number: [id number] Patient Account Number: [id number] Date of Birth/Sex: Treating RN: 01-16-1945 (75 y.o. Brett [name] Primary Care Provider: [name] Other Clinician: Referring Provider: Treating Provider/Extender: Brett [name] Weeks in Treatment: 2 Information Obtained From Patient Chart Eyes Medical History: Negative for: Cataracts; Glaucoma; Optic Neuritis Ear/Nose/Mouth/Throat Medical History: Negative for: Chronic sinus problems/congestion; Middle ear problems Hematologic/Lymphatic Medical History: Negative for: Anemia; Hemophilia; Human Immunodeficiency Virus; Lymphedema; Sickle Cell Disease Respiratory Medical History: Positive for: Sleep Apnea - CPAP Negative for: Aspiration; Asthma; Chronic Obstructive Pulmonary Disease (COPD); Pneumothorax; Tuberculosis Cardiovascular Medical History: Positive for: Arrhythmia - A. Fib; Deep Vein Thrombosis - 2014; Hypertension Negative for: Angina; Congestive Heart Failure; Coronary Artery Disease; Hypotension; Myocardial Infarction; Peripheral Arterial Disease; Peripheral Venous Disease; Phlebitis; Vasculitis Past Medical History Notes: thrombocytopenia dyslipidemia Gastrointestinal Medical History: Negative for: Cirrhosis ; Colitis; Crohns; Hepatitis A; Hepatitis B; Hepatitis C Endocrine Medical History: Negative for: Type I Diabetes; Type II Diabetes Past Medical History Notes: Prediabetic Genitourinary Medical History: Negative for: End Stage Renal Disease Immunological Medical History: Negative for: Lupus Erythematosus; Raynauds; Scleroderma Integumentary (Skin) Medical History: Negative for: History of Burn Musculoskeletal Medical History: Negative for: Gout; Rheumatoid Arthritis; Osteoarthritis; Osteomyelitis Neurologic Medical History: Negative  for: Dementia; Neuropathy; Quadriplegia; Paraplegia; Seizure Disorder Oncologic Medical  History: Negative for: Received Chemotherapy; Received Radiation Psychiatric Medical History: Negative for: Anorexia/bulimia; Confinement Anxiety Immunizations Pneumococcal Vaccine: Received Pneumococcal Vaccination: Yes Received Pneumococcal Vaccination On or After 60th Birthday: Yes Implantable Devices No devices added Hospitalization / Surgery History Type of Hospitalization/Surgery bilateral knee replacements 2010 and 2011 2014 DVT BLE Family and Social History Cancer: Yes - Father; Diabetes: No; Heart Disease: No; Hereditary Spherocytosis: No; Hypertension: No; Kidney Disease: No; Lung Disease: No; Seizures: No; Stroke: No; Thyroid Problems: No; Tuberculosis: No; Never smoker; Marital Status - Divorced; Alcohol Use: Never; Drug Use: No History; Caffeine Use: Never; Financial Concerns: No; Food, Clothing or Shelter Needs: No; Support System Lacking: No; Transportation Concerns: No Electronic Signature(s) Signed: 11/15/2020 12:07:01 PM By: Kalman Shan DO Signed: 11/16/2020 5:29:58 PM By: Deon Pilling RN, BSN Entered By: Kalman Shan on 11/15/2020 11:58:47 -------------------------------------------------------------------------------- [place]ulo Details Patient Name: Date of Service: [facility], [name] 11/15/2020 Medical Record Number: [id number] Patient Account Number: [id number] Date of Birth/Sex: Treating RN: Mar 29, 1945 (75 y.o. Brett [name], Brett [name] Primary Care Provider: [name] Other Clinician: Referring Provider: Treating Provider/Extender: Brett [name] Weeks in Treatment: 2 Diagnosis Coding ICD-10 Codes Code Description [phone] Non-pressure chronic ulcer of other part of right lower leg limited to breakdown of skin L97.921 Non-pressure chronic ulcer of unspecified part of left lower leg limited to breakdown of skin E11.622 Type 2 diabetes mellitus with other skin ulcer I87.2 Venous insufficiency (chronic) (peripheral) I89.0  Lymphedema, not elsewhere classified I48.19 Other persistent atrial fibrillation Z79.01 Long term (current) use of anticoagulants Facility Procedures CPT4 Code: [id number] Description: 99214 - WOUND CARE VISIT-LEV 4 EST PT Modifier: Quantity: 1 Physician Procedures : CPT4 Code Description Modifier [id number] - WC PHYS LEVEL 3 - EST PT ICD-10 Diagnosis Description L97.811 Non-pressure chronic ulcer of other part of right lower leg limited to breakdown of skin E11.622 Type 2 diabetes mellitus with other skin ulcer  I89.0 Lymphedema, not elsewhere classified I87.2 Venous insufficiency (chronic) (peripheral) Quantity: 1 Electronic Signature(s) Signed: 11/15/2020 12:07:01 PM By: Kalman Shan DO Entered By: Kalman Shan on 11/15/2020 12:06:39

## 2020-11-18 ENCOUNTER — Encounter (HOSPITAL_COMMUNITY): Payer: Self-pay

## 2020-11-18 ENCOUNTER — Other Ambulatory Visit: Payer: Self-pay

## 2020-11-18 ENCOUNTER — Ambulatory Visit (HOSPITAL_COMMUNITY): Admission: RE | Admit: 2020-11-18 | Payer: Medicare Other | Source: Ambulatory Visit

## 2020-11-18 ENCOUNTER — Other Ambulatory Visit (HOSPITAL_COMMUNITY): Payer: Self-pay | Admitting: Internal Medicine

## 2020-11-18 ENCOUNTER — Ambulatory Visit (HOSPITAL_COMMUNITY)
Admission: RE | Admit: 2020-11-18 | Discharge: 2020-11-18 | Disposition: A | Discharge status: Home / Self Care | Payer: Medicare Other | Source: Ambulatory Visit | Attending: Nurse Practitioner | Admitting: Nurse Practitioner

## 2020-11-18 DIAGNOSIS — E11622 Type 2 diabetes mellitus with other skin ulcer: Secondary | ICD-10-CM

## 2020-11-18 DIAGNOSIS — L98499 Non-pressure chronic ulcer of skin of other sites with unspecified severity: Secondary | ICD-10-CM

## 2020-11-21 ENCOUNTER — Other Ambulatory Visit: Payer: Self-pay

## 2020-11-21 ENCOUNTER — Ambulatory Visit (INDEPENDENT_AMBULATORY_CARE_PROVIDER_SITE_OTHER): Payer: Medicare Other | Admitting: *Deleted

## 2020-11-21 DIAGNOSIS — Z7901 Long term (current) use of anticoagulants: Secondary | ICD-10-CM

## 2020-11-21 DIAGNOSIS — Z5181 Encounter for therapeutic drug level monitoring: Secondary | ICD-10-CM | POA: Diagnosis not present

## 2020-11-21 LAB — POCT INR: INR: 2.5 (ref 2.0–3.0)

## 2020-11-21 NOTE — Patient Instructions (Signed)
Description   Continue taking 1.5 tablets daily except 1 tablet on Sunday.  Repeat INR in 6 weeks.  [phone]

## 2020-11-22 DIAGNOSIS — C44329 Squamous cell carcinoma of skin of other parts of face: Secondary | ICD-10-CM | POA: Diagnosis not present

## 2020-11-22 DIAGNOSIS — L989 Disorder of the skin and subcutaneous tissue, unspecified: Secondary | ICD-10-CM | POA: Diagnosis not present

## 2020-11-24 ENCOUNTER — Encounter (HOSPITAL_BASED_OUTPATIENT_CLINIC_OR_DEPARTMENT_OTHER): Payer: Medicare Other | Admitting: Internal Medicine

## 2020-12-13 DIAGNOSIS — L570 Actinic keratosis: Secondary | ICD-10-CM | POA: Diagnosis not present

## 2020-12-13 DIAGNOSIS — L905 Scar conditions and fibrosis of skin: Secondary | ICD-10-CM | POA: Diagnosis not present

## 2021-01-02 ENCOUNTER — Other Ambulatory Visit: Payer: Self-pay | Admitting: Nurse Practitioner

## 2021-01-04 ENCOUNTER — Ambulatory Visit (INDEPENDENT_AMBULATORY_CARE_PROVIDER_SITE_OTHER): Payer: Medicare Other

## 2021-01-04 ENCOUNTER — Other Ambulatory Visit: Payer: Self-pay

## 2021-01-04 DIAGNOSIS — Z7901 Long term (current) use of anticoagulants: Secondary | ICD-10-CM | POA: Diagnosis not present

## 2021-01-04 LAB — POCT INR: INR: 2 (ref 2.0–3.0)

## 2021-01-04 NOTE — Patient Instructions (Signed)
Description   Take 2 tablets today and then continue taking 1.5 tablets daily except 1 tablet on Sunday.  Repeat INR in 6 weeks.  [phone]

## 2021-01-12 ENCOUNTER — Other Ambulatory Visit: Payer: Self-pay

## 2021-01-12 ENCOUNTER — Ambulatory Visit (INDEPENDENT_AMBULATORY_CARE_PROVIDER_SITE_OTHER): Payer: Medicare Other

## 2021-01-12 VITALS — BP 124/80 | HR 91 | Temp 98.3°F | Ht 65.0 in | Wt 303.0 lb

## 2021-01-12 DIAGNOSIS — Z0000 Encounter for general adult medical examination without abnormal findings: Secondary | ICD-10-CM

## 2021-01-12 DIAGNOSIS — Z23 Encounter for immunization: Secondary | ICD-10-CM

## 2021-01-12 NOTE — Patient Instructions (Signed)
[name] , Thank you for taking time to come for your Medicare Wellness Visit. I appreciate your ongoing commitment to your health goals. Please review the following plan we discussed and let me know if I can assist you in the future.   Screening recommendations/referrals: Colonoscopy: not required Recommended yearly ophthalmology/optometry visit for glaucoma screening and checkup Recommended yearly dental visit for hygiene and checkup  Vaccinations: Influenza vaccine: today Pneumococcal vaccine: decline Tdap vaccine: completed 09/15/2012, due 09/16/2022 Shingles vaccine: discussed   Covid-19:  03/24/2019, 02/22/2019  Advanced directives: Please bring a copy of your POA (Power of Attorney) and/or Living Will to your next appointment.   Conditions/risks identified: none  Next appointment: Follow up in one year for your annual wellness visit.   Preventive Care 77 Years and Older, Male Preventive care refers to lifestyle choices and visits with your health care provider that can promote health and wellness. What does preventive care include? A yearly physical exam. This is also called an annual well check. Dental exams once or twice a year. Routine eye exams. Ask your health care provider how often you should have your eyes checked. Personal lifestyle choices, including: Daily care of your teeth and gums. Regular physical activity. Eating a healthy diet. Avoiding tobacco and drug use. Limiting alcohol use. Practicing safe sex. Taking low doses of aspirin every day. Taking vitamin and mineral supplements as recommended by your health care provider. What happens during an annual well check? The services and screenings done by your health care provider during your annual well check will depend on your age, overall health, lifestyle risk factors, and family history of disease. Counseling  Your health care provider may ask you questions about your: Alcohol use. Tobacco use. Drug  use. Emotional well-being. Home and relationship well-being. Sexual activity. Eating habits. History of falls. Memory and ability to understand (cognition). Work and work Statistician. Screening  You may have the following tests or measurements: Height, weight, and BMI. Blood pressure. Lipid and cholesterol levels. These may be checked every 5 years, or more frequently if you are over 14 years old. Skin check. Lung cancer screening. You may have this screening every year starting at age 21 if you have a 30-pack-year history of smoking and currently smoke or have quit within the past 15 years. Fecal occult blood test (FOBT) of the stool. You may have this test every year starting at age 56. Flexible sigmoidoscopy or colonoscopy. You may have a sigmoidoscopy every 5 years or a colonoscopy every 10 years starting at age 77. Prostate cancer screening. Recommendations will vary depending on your family history and other risks. Hepatitis C blood test. Hepatitis B blood test. Sexually transmitted disease (STD) testing. Diabetes screening. This is done by checking your blood sugar (glucose) after you have not eaten for a while (fasting). You may have this done every 1-3 years. Abdominal aortic aneurysm (AAA) screening. You may need this if you are a current or former smoker. Osteoporosis. You may be screened starting at age 77 if you are at high risk. Talk with your health care provider about your test results, treatment options, and if necessary, the need for more tests. Vaccines  Your health care provider may recommend certain vaccines, such as: Influenza vaccine. This is recommended every year. Tetanus, diphtheria, and acellular pertussis (Tdap, Td) vaccine. You may need a Td booster every 10 years. Zoster vaccine. You may need this after age 77. Pneumococcal 13-valent conjugate (PCV13) vaccine. One dose is recommended after age 77.  Pneumococcal polysaccharide (PPSV23) vaccine. One dose is  recommended after age 77. Talk to your health care provider about which screenings and vaccines you need and how often you need them. This information is not intended to replace advice given to you by your health care provider. Make sure you discuss any questions you have with your health care provider. Document Released: 01/21/2015 Document Revised: 09/14/2015 Document Reviewed: 10/26/2014 Elsevier Interactive Patient Education  2017 [place] Prevention in the Home Falls can cause injuries. They can happen to people of all ages. There are many things you can do to make your home safe and to help prevent falls. What can I do on the outside of my home? Regularly fix the edges of walkways and driveways and fix any cracks. Remove anything that might make you trip as you walk through a door, such as a raised step or threshold. Trim any bushes or trees on the path to your home. Use bright outdoor lighting. Clear any walking paths of anything that might make someone trip, such as rocks or tools. Regularly check to see if handrails are loose or broken. Make sure that both sides of any steps have handrails. Any raised decks and porches should have guardrails on the edges. Have any leaves, snow, or ice cleared regularly. Use sand or salt on walking paths during winter. Clean up any spills in your garage right away. This includes oil or grease spills. What can I do in the bathroom? Use night lights. Install grab bars by the toilet and in the tub and shower. Do not use towel bars as grab bars. Use non-skid mats or decals in the tub or shower. If you need to sit down in the shower, use a plastic, non-slip stool. Keep the floor dry. Clean up any water that spills on the floor as soon as it happens. Remove soap buildup in the tub or shower regularly. Attach bath mats securely with double-sided non-slip rug tape. Do not have throw rugs and other things on the floor that can make you  trip. What can I do in the bedroom? Use night lights. Make sure that you have a light by your bed that is easy to reach. Do not use any sheets or blankets that are too big for your bed. They should not hang down onto the floor. Have a firm chair that has side arms. You can use this for support while you get dressed. Do not have throw rugs and other things on the floor that can make you trip. What can I do in the kitchen? Clean up any spills right away. Avoid walking on wet floors. Keep items that you use a lot in easy-to-reach places. If you need to reach something above you, use a strong step stool that has a grab bar. Keep electrical cords out of the way. Do not use floor polish or wax that makes floors slippery. If you must use wax, use non-skid floor wax. Do not have throw rugs and other things on the floor that can make you trip. What can I do with my stairs? Do not leave any items on the stairs. Make sure that there are handrails on both sides of the stairs and use them. Fix handrails that are broken or loose. Make sure that handrails are as long as the stairways. Check any carpeting to make sure that it is firmly attached to the stairs. Fix any carpet that is loose or worn. Avoid having throw rugs at the  top or bottom of the stairs. If you do have throw rugs, attach them to the floor with carpet tape. Make sure that you have a light switch at the top of the stairs and the bottom of the stairs. If you do not have them, ask someone to add them for you. What else can I do to help prevent falls? Wear shoes that: Do not have high heels. Have rubber bottoms. Are comfortable and fit you well. Are closed at the toe. Do not wear sandals. If you use a stepladder: Make sure that it is fully opened. Do not climb a closed stepladder. Make sure that both sides of the stepladder are locked into place. Ask someone to hold it for you, if possible. Clearly mark and make sure that you can  see: Any grab bars or handrails. First and last steps. Where the edge of each step is. Use tools that help you move around (mobility aids) if they are needed. These include: Canes. Walkers. Scooters. Crutches. Turn on the lights when you go into a dark area. Replace any light bulbs as soon as they burn out. Set up your furniture so you have a clear path. Avoid moving your furniture around. If any of your floors are uneven, fix them. If there are any pets around you, be aware of where they are. Review your medicines with your doctor. Some medicines can make you feel dizzy. This can increase your chance of falling. Ask your doctor what other things that you can do to help prevent falls. This information is not intended to replace advice given to you by your health care provider. Make sure you discuss any questions you have with your health care provider. Document Released: 10/21/2008 Document Revised: 06/02/2015 Document Reviewed: 01/29/2014 Elsevier Interactive Patient Education  2017 [employer].

## 2021-01-12 NOTE — Progress Notes (Signed)
This visit occurred during the SARS-CoV-2 public health emergency.  Safety protocols were in place, including screening questions prior to the visit, additional usage of staff PPE, and extensive cleaning of exam room while observing appropriate contact time as indicated for disinfecting solutions.  Subjective:   Brett [name] is a 76 y.o. male who presents for Medicare Annual/Subsequent preventive examination.  Review of Systems     Cardiac Risk Factors include: advanced age (>49mn, >>65women);diabetes mellitus;hypertension;male gender;obesity (BMI >30kg/m2)     Objective:    Today's Vitals   01/12/21 0826  BP: 124/80  Pulse: 91  Temp: 98.3 F (36.8 C)  TempSrc: Oral  SpO2: 94%  Weight: (!) 303 lb (137.4 kg)  Height: 5' 5"  (1.651 m)   Body mass index is 50.42 kg/m.  Advanced Directives 01/12/2021 01/13/2020 11/27/2018 11/21/2017 06/11/2012 06/08/2011  Does Patient Have a Medical Advance Directive? Yes Yes Yes No Patient does not have advance directive Patient does not have advance directive  Type of Advance Directive Healthcare Power of [place] - - -  Copy of [place]? No - copy requested No - copy requested No - copy requested - - -  Would patient like information on creating a medical advance directive? - - - Yes (MAU/Ambulatory/Procedural Areas - Information given) - -  Pre-existing out of facility DNR order (yellow form or pink MOST form) - - - - - No    Current Medications (verified) Outpatient Encounter Medications as of 01/12/2021  Medication Sig   acetaminophen (TYLENOL) 500 MG tablet Take 1,000 mg by mouth daily.    ammonium lactate (AMLACTIN) 12 % lotion Apply 1 application topically as needed for dry skin.   blood glucose meter kit and supplies KIT Dispense based on patient and insurance preference. Use up to four times daily as directed. (FOR ICD-9 250.00,  250.01).   Blood Glucose Monitoring Suppl (FREESTYLE LITE) DEVI Use to test blood sugar 3 times a day. Dx code e11.65   diltiazem (CARDIZEM CD) 240 MG 24 hr capsule TAKE 1 CAPSULE DAILY   diphenhydramine-acetaminophen (TYLENOL PM) 25-500 MG TABS Take 2 tablets by mouth at bedtime.   fish oil-omega-3 fatty acids 1000 MG capsule Take 1 g by mouth daily.   FREESTYLE LITE test strip TEST UP TO FOUR TIMES A DAY AS DIRECTED   furosemide (LASIX) 20 MG tablet TAKE 1 TABLET TWICE A DAY, MAY TAKE AN EXTRA 20 MG (1 TABLET) AS NEEDED DAILY (PLEASE SCHEDULE AN APPOINTMENT FOR REFILLS)   hydrALAZINE (APRESOLINE) 50 MG tablet TAKE 1 TABLET TWICE A DAY WITH FOOD   JARDIANCE 10 MG TABS tablet TAKE 1 TABLET DAILY BEFORE BREAKFAST   Lancets (FREESTYLE) lancets TEST UP TO FOUR TIMES A DAY AS DIRECTED   loratadine (CLARITIN) 10 MG tablet TAKE 1 TABLET DAILY   metFORMIN (GLUCOPHAGE-XR) 750 MG 24 hr tablet TAKE 1 TABLET DAILY   metoprolol tartrate (LOPRESSOR) 100 MG tablet TAKE 1 TABLET THREE TIMES A DAY   Multiple Vitamin (MULITIVITAMIN WITH MINERALS) TABS Take 1 tablet by mouth daily.   NON FORMULARY at bedtime. CPAP   pravastatin (PRAVACHOL) 80 MG tablet TAKE 1 TABLET DAILY   psyllium (REGULOID) 0.52 g capsule Take 5 capsules by mouth daily.   TRULICITY 01.96MQI/2.9NLSOPN INJECT 0.75 MG UNDER THE SKIN ONCE A WEEK   warfarin (COUMADIN) 4 MG tablet TAKE ONE TO ONE AND ONE-HALF TABLETS DAILY AS DIRECTED PER  COUMADIN CLINIC   No facility-administered encounter medications on file as of 01/12/2021.    Allergies (verified) Patient has no known allergies.   History: Past Medical History:  Diagnosis Date   DVT of lower extremity, bilateral ([place]) 06/17/2012   Right CFV and popliteal vein, left C. it the only;  also interstitial fluid noted in both calfs   Dyslipidemia    History of echocardiogram 02/07/2011   EF 50-60%; RV mildly dilated, LA moderately dilated; mild MR; mild-mod TR; RV systolic pressure elevated;  mildly sclerotic AV   History of nuclear stress test 02/07/2011   lexiscan; mild ischemia in mid inferolateral & apical lateral regions; low risk scan   History of: Bacteremia due to coagulase-negative Staphylococcus 06/13/2012   During hospitalization May 30 through 06/19/2012    History: TTP (thrombotic thrombocytopenic purpura) 06/11/2012   Hypertension    Obesity (BMI 30-39.9)    OSA on CPAP    Persistent atrial fibrillation ([place]) 02/2011   Negative Myoview; Normal EF by Echo   Severe sepsis with acute organ dysfunction ([place]) 5/30-6/12/2012   Prolonged hospitalization for presumed sepsis and septic shock compounded by coagulopathy thought to be TTP versus Pradaxa mediated. Temporarily on hemodialysis and plasmapheresis.   Past Surgical History:  Procedure Laterality Date   CARDIOVERSION  06/08/2011   Procedure: CARDIOVERSION;  Surgeon: Leonie Man, MD;  Location: [facility];  Service: Cardiovascular;  Laterality: N/A;   SKIN SURGERY Left    left side of face 11/2020   TOTAL KNEE ARTHROPLASTY     Left knee 2010, Right knee 2011   Family History  Problem Relation Age of Onset   Heart failure Mother 73   Cancer Father        stomach   Social History   Socioeconomic History   Marital status: Divorced    Spouse name: Not on file   Number of children: 1   Years of education: Not on file   Highest education level: Not on file  Occupational History   Occupation: retired  Tobacco Use   Smoking status: Former    Types: Cigars   Smokeless tobacco: Never  Scientific laboratory technician Use: Never used  Substance and Sexual Activity   Alcohol use: No    Comment: couple beers a year   Drug use: No   Sexual activity: Not Currently  Other Topics Concern   Not on file  Social History Narrative   He is a divorced father of one. Prior to this hospitalization he was walking 5-10 minutes a day 4 times a week. He now is gradually building up his rehabilitation doing mild exercise daily.      He  quit smoking in 1968 and rarely has alcohol.   Social Determinants of Health   Financial Resource Strain: Low Risk    Difficulty of Paying Living Expenses: Not hard at all  Food Insecurity: No Food Insecurity   Worried About Charity fundraiser in the Last Year: Never true   [place] in the Last Year: Never true  Transportation Needs: No Transportation Needs   Lack of Transportation (Medical): No   Lack of Transportation (Non-Medical): No  Physical Activity: Inactive   Days of Exercise per Week: 0 days   Minutes of Exercise per Session: 0 min  Stress: No Stress Concern Present   Feeling of Stress : Not at all  Social Connections: Not on file    Tobacco Counseling Counseling given: Not Answered  Clinical Intake:  Pre-visit preparation completed: Yes  Pain : No/denies pain     Nutritional Status: BMI > 30  Obese Nutritional Risks: None Diabetes: Yes  How often do you need to have someone help you when you read instructions, pamphlets, or other written materials from your doctor or pharmacy?: 1 - Never What is the last grade level you completed in school?: 14 yrs  Diabetic? Yes Nutrition Risk Assessment:  Has the patient had any N/V/D within the last 2 months?  No  Does the patient have any non-healing wounds?  No  Has the patient had any unintentional weight loss or weight gain?  No   Diabetes:  Is the patient diabetic?  Yes  If diabetic, was a CBG obtained today?  No  Did the patient bring in their glucometer from home?  No  How often do you monitor your CBG's? Twice daily.   Financial Strains and Diabetes Management:  Are you having any financial strains with the device, your supplies or your medication? No .  Does the patient want to be seen by Chronic Care Management for management of their diabetes?  No  Would the patient like to be referred to a Nutritionist or for Diabetic Management?  No   Diabetic Exams:  Diabetic Eye Exam: Overdue for  diabetic eye exam. Pt has been advised about the importance in completing this exam. Patient advised to call and schedule an eye exam. Diabetic Foot Exam: Overdue, Pt has been advised about the importance in completing this exam. Pt is scheduled for diabetic foot exam on next appointment.   Interpreter Needed?: No  Information entered by :: NAllen LPN   Activities of Daily Living In your present state of health, do you have any difficulty performing the following activities: 01/12/2021 01/13/2020  Hearing? Aggie Moats  Comment from [place]? N N  Difficulty concentrating or making decisions? N N  Walking or climbing stairs? N N  Dressing or bathing? N N  Doing errands, shopping? N N  Preparing Food and eating ? N N  Using the Toilet? N N  In the past six months, have you accidently leaked urine? N N  Do you have problems with loss of bowel control? N N  Managing your Medications? N N  Managing your Finances? N N  Housekeeping or managing your Housekeeping? N N  Some recent data might be hidden    Patient Care Team: [name], FNP as PCP - General ([place]) Suella Broad, FNP as Nurse Practitioner (Psychiatry)  Indicate any recent Medical Services you may have received from other than [facility] providers in the past year (date may be approximate).     Assessment:   This is a routine wellness examination for [place].  Hearing/Vision screen Vision Screening - Comments:: No regular eye exams,  Dietary issues and exercise activities discussed: Current Exercise Habits: The patient has a physically strenuous job, but has no regular exercise apart from work.   Goals Addressed             This Visit's Progress    Patient Stated       01/12/2021, continue on       Depression Screen PHQ 2/9 Scores 01/12/2021 01/13/2020 05/28/2019 11/27/2018 05/22/2018 11/21/2017 11/21/2017  PHQ - 2 Score 0 0 0 0 0 0 0  PHQ- 9 Score - - - 0 - - -    Fall Risk Fall Risk   01/12/2021 07/12/2020 05/28/2019 11/27/2018 05/22/2018  Falls in the past year? 0 0 0 0 0  Comment - - - - -  Number falls in past yr: - 0 0 - -  Injury with Fall? - 0 0 - -  Risk for fall due to : Medication side effect;Impaired balance/gait - - Impaired mobility;Impaired balance/gait;Medication side effect -  Follow up Falls evaluation completed;Education provided;Falls prevention discussed - - Falls evaluation completed;Education provided;Falls prevention discussed -    FALL RISK PREVENTION PERTAINING TO THE HOME:  Any stairs in or around the home? Yes  If so, are there any without handrails? No  Home free of loose throw rugs in walkways, pet beds, electrical cords, etc? Yes  Adequate lighting in your home to reduce risk of falls? Yes   ASSISTIVE DEVICES UTILIZED TO PREVENT FALLS:  Life alert? No  Use of a cane, walker or w/c? Yes  Grab bars in the bathroom? Yes  Shower chair or bench in shower? No  Elevated toilet seat or a handicapped toilet? No   TIMED UP AND GO:  Was the test performed? No .     Gait slow and steady with assistive device  Cognitive Function:     6CIT Screen 01/12/2021 01/13/2020 11/27/2018 11/21/2017  What Year? 0 points 0 points 0 points 0 points  What month? 0 points 0 points 0 points 0 points  What time? 0 points 0 points 0 points 0 points  Count back from 20 0 points 0 points 0 points 0 points  Months in reverse 0 points 0 points 0 points 0 points  Repeat phrase 2 points 2 points 4 points 0 points  Total Score 2 2 4  0    Immunizations Immunization History  Administered Date(s) Administered   Fluad Quad(high Dose 65+) 09/09/2019, 01/12/2021   Influenza, High Dose Seasonal PF 11/27/2018   Influenza-Unspecified 12/08/2012   PFIZER(Purple Top)SARS-COV-2 Vaccination 02/22/2019, 03/24/2019    TDAP status: Up to date  Flu Vaccine status: Completed at today's visit  Pneumococcal vaccine status: Declined,  Education has been provided regarding the  importance of this vaccine but patient still declined. Advised may receive this vaccine at local pharmacy or Health Dept. Aware to provide a copy of the vaccination record if obtained from local pharmacy or Health Dept. Verbalized acceptance and understanding.   Covid-19 vaccine status: Completed vaccines  Qualifies for Shingles Vaccine? Yes   Zostavax completed No   Shingrix Completed?: No.    Education has been provided regarding the importance of this vaccine. Patient has been advised to call insurance company to determine out of pocket expense if they have not yet received this vaccine. Advised may also receive vaccine at local pharmacy or Health Dept. Verbalized acceptance and understanding.  Screening Tests Health Maintenance  Topic Date Due   Pneumonia Vaccine 61+ Years old (1 - PCV) Never done   FOOT EXAM  Never done   Zoster Vaccines- Shingrix (1 of 2) Never done   OPHTHALMOLOGY EXAM  03/18/2019   COVID-19 Vaccine (3 - Pfizer risk series) 04/21/2019   HEMOGLOBIN A1C  04/12/2021   TETANUS/TDAP  09/16/2022   INFLUENZA VACCINE  Completed   Hepatitis C Screening  Completed   HPV VACCINES  Aged Out   COLONOSCOPY (Pts 45-29yr Insurance coverage will need to be confirmed)  Discontinued    Health Maintenance  Health Maintenance Due  Topic Date Due   Pneumonia Vaccine 77 Years old (1 - PCV) Never done   FOOT EXAM  Never done   Zoster  Vaccines- Shingrix (1 of 2) Never done   OPHTHALMOLOGY EXAM  03/18/2019   COVID-19 Vaccine (3 - Pfizer risk series) 04/21/2019    Colorectal cancer screening: No longer required.   Lung Cancer Screening: (Low Dose CT Chest recommended if Age 48-80 years, 30 pack-year currently smoking OR have quit w/in 15years.) does not qualify.   Lung Cancer Screening Referral: no  Additional Screening:  Hepatitis C Screening: does qualify; Completed 05/22/2018  Vision Screening: Recommended annual ophthalmology exams for early detection of glaucoma and  other disorders of the eye. Is the patient up to date with their annual eye exam?  No  Who is the provider or what is the name of the office in which the patient attends annual eye exams? none If pt is not established with a provider, would they like to be referred to a provider to establish care? No .   Dental Screening: Recommended annual dental exams for proper oral hygiene  Community Resource Referral / Chronic Care Management: CRR required this visit?  No   CCM required this visit?  No      Plan:     I have personally reviewed and noted the following in the patients chart:   Medical and social history Use of alcohol, tobacco or illicit drugs  Current medications and supplements including opioid prescriptions. Patient is not currently taking opioid prescriptions. Functional ability and status Nutritional status Physical activity Advanced directives List of other physicians Hospitalizations, surgeries, and ER visits in previous 12 months Vitals Screenings to include cognitive, depression, and falls Referrals and appointments  In addition, I have reviewed and discussed with patient certain preventive protocols, quality metrics, and best practice recommendations. A written personalized care plan for preventive services as well as general preventive health recommendations were provided to patient.     Kellie Simmering, LPN   01/13/1094   Nurse Notes: none

## 2021-01-26 ENCOUNTER — Other Ambulatory Visit: Payer: Self-pay | Admitting: Nurse Practitioner

## 2021-02-03 ENCOUNTER — Other Ambulatory Visit: Payer: Self-pay | Admitting: Cardiology

## 2021-02-13 ENCOUNTER — Other Ambulatory Visit: Payer: Self-pay | Admitting: Nurse Practitioner

## 2021-02-15 ENCOUNTER — Ambulatory Visit (INDEPENDENT_AMBULATORY_CARE_PROVIDER_SITE_OTHER): Payer: Medicare Other

## 2021-02-15 ENCOUNTER — Other Ambulatory Visit: Payer: Self-pay

## 2021-02-15 ENCOUNTER — Other Ambulatory Visit: Payer: Self-pay | Admitting: Cardiology

## 2021-02-15 ENCOUNTER — Encounter: Payer: Self-pay | Admitting: Nurse Practitioner

## 2021-02-15 ENCOUNTER — Ambulatory Visit (INDEPENDENT_AMBULATORY_CARE_PROVIDER_SITE_OTHER): Payer: Medicare Other | Admitting: Nurse Practitioner

## 2021-02-15 VITALS — BP 130/60 | HR 64 | Temp 98.2°F | Ht 65.0 in | Wt 302.0 lb

## 2021-02-15 DIAGNOSIS — E1169 Type 2 diabetes mellitus with other specified complication: Secondary | ICD-10-CM | POA: Diagnosis not present

## 2021-02-15 DIAGNOSIS — Z7901 Long term (current) use of anticoagulants: Secondary | ICD-10-CM

## 2021-02-15 DIAGNOSIS — Z23 Encounter for immunization: Secondary | ICD-10-CM

## 2021-02-15 DIAGNOSIS — I4819 Other persistent atrial fibrillation: Secondary | ICD-10-CM | POA: Diagnosis not present

## 2021-02-15 DIAGNOSIS — Z5181 Encounter for therapeutic drug level monitoring: Secondary | ICD-10-CM

## 2021-02-15 DIAGNOSIS — I10 Essential (primary) hypertension: Secondary | ICD-10-CM | POA: Diagnosis not present

## 2021-02-15 DIAGNOSIS — Z9989 Dependence on other enabling machines and devices: Secondary | ICD-10-CM | POA: Diagnosis not present

## 2021-02-15 DIAGNOSIS — E119 Type 2 diabetes mellitus without complications: Secondary | ICD-10-CM | POA: Diagnosis not present

## 2021-02-15 DIAGNOSIS — E782 Mixed hyperlipidemia: Secondary | ICD-10-CM

## 2021-02-15 LAB — POCT INR: INR: 1.7 — AB (ref 2.0–3.0)

## 2021-02-15 NOTE — Progress Notes (Signed)
°I,Tianna Badgett,acting as a scribe for  , FNP.,have documented all relevant documentation on the behalf of  , FNP,as directed by   , FNP while in the presence of  , FNP. ° °This visit occurred during the SARS-CoV-2 public health emergency.  Safety protocols were in place, including screening questions prior to the visit, additional usage of staff PPE, and extensive cleaning of exam room while observing appropriate contact time as indicated for disinfecting solutions. ° °Subjective:  °  ° Patient ID: Brett [name] , male    DOB: 06/30/1945 , 76 y.o.   MRN: [id number] ° ° °Chief Complaint  °Patient presents with  ° Diabetes  ° ° °HPI ° °Pr presents today for BP & DM f/u. Continues to do well with the medication.  Continues to go to the Coumadin clinic went this morning - he is to take 2 tablets today and then continue taking 1.5 tablets daily except 1 tablet on Sunday.  Repeat INR in 4 weeks per the note in his chart. ° °Diabetes °He presents for his follow-up diabetic visit. He has type 2 diabetes mellitus. His disease course has been stable. Pertinent negatives for hypoglycemia include no dizziness or headaches. Pertinent negatives for diabetes include no chest pain. There are no hypoglycemic complications. There are no diabetic complications. Risk factors for coronary artery disease include sedentary lifestyle, obesity, male sex, hypertension and diabetes mellitus. Current diabetic treatment includes oral agent (dual therapy). He is compliant with treatment all of the time. He is following a generally unhealthy diet. When asked about meal planning, he reported none. He has not had a previous visit with a dietitian. He rarely participates in exercise. There is no change in his home blood glucose trend. (Blood sugar averaging °140 after eating and 2 hours after 130) He does not see a podiatrist. °Hypertension °This is a chronic problem. The current episode started  more than 1 year ago. The problem has been gradually worsening since onset. The problem is uncontrolled. Pertinent negatives include no anxiety, chest pain, headaches or palpitations. There are no associated agents to hypertension. Risk factors for coronary artery disease include obesity and male gender. Past treatments include ACE inhibitors and alpha 1 blockers. The current treatment provides moderate improvement. There are no compliance problems.  There is no history of angina or kidney disease. There is no history of chronic renal disease.   ° °Past Medical History:  °Diagnosis Date  ° DVT of lower extremity, bilateral (HCC) 06/17/2012  ° Right CFV and popliteal vein, left C. it the only;  also interstitial fluid noted in both calfs  ° Dyslipidemia   ° History of echocardiogram 02/07/2011  ° EF 50-60%; RV mildly dilated, LA moderately dilated; mild MR; mild-mod TR; RV systolic pressure elevated; mildly sclerotic AV  ° History of nuclear stress test 02/07/2011  ° lexiscan; mild ischemia in mid inferolateral & apical lateral regions; low risk scan  ° History of: Bacteremia due to coagulase-negative Staphylococcus 06/13/2012  ° During hospitalization May 30 through 06/19/2012   ° History: TTP (thrombotic thrombocytopenic purpura) 06/11/2012  ° Hypertension   ° Obesity (BMI 30-39.9)   ° OSA on CPAP   ° Persistent atrial fibrillation (HCC) 02/2011  ° Negative Myoview; Normal EF by Echo  ° Severe sepsis with acute organ dysfunction (HCC) 5/30-6/12/2012  ° Prolonged hospitalization for presumed sepsis and septic shock compounded by coagulopathy thought to be TTP versus Pradaxa mediated. Temporarily on hemodialysis and plasmapheresis.  °  ° °Family History  °  °I,Tianna Badgett,acting as a scribe for  , FNP.,have documented all relevant documentation on the behalf of  , FNP,as directed by   , FNP while in the presence of  , FNP. ° °This visit occurred during the SARS-CoV-2 public health emergency.  Safety protocols were in place, including screening questions prior to the visit, additional usage of staff PPE, and extensive cleaning of exam room while observing appropriate contact time as indicated for disinfecting solutions. ° °Subjective:  °  ° Patient ID: Brett [name] , male    DOB: 06/30/1945 , 76 y.o.   MRN: [id number] ° ° °Chief Complaint  °Patient presents with  ° Diabetes  ° ° °HPI ° °Pr presents today for BP & DM f/u. Continues to do well with the medication.  Continues to go to the Coumadin clinic went this morning - he is to take 2 tablets today and then continue taking 1.5 tablets daily except 1 tablet on Sunday.  Repeat INR in 4 weeks per the note in his chart. ° °Diabetes °He presents for his follow-up diabetic visit. He has type 2 diabetes mellitus. His disease course has been stable. Pertinent negatives for hypoglycemia include no dizziness or headaches. Pertinent negatives for diabetes include no chest pain. There are no hypoglycemic complications. There are no diabetic complications. Risk factors for coronary artery disease include sedentary lifestyle, obesity, male sex, hypertension and diabetes mellitus. Current diabetic treatment includes oral agent (dual therapy). He is compliant with treatment all of the time. He is following a generally unhealthy diet. When asked about meal planning, he reported none. He has not had a previous visit with a dietitian. He rarely participates in exercise. There is no change in his home blood glucose trend. (Blood sugar averaging °140 after eating and 2 hours after 130) He does not see a podiatrist. °Hypertension °This is a chronic problem. The current episode started  more than 1 year ago. The problem has been gradually worsening since onset. The problem is uncontrolled. Pertinent negatives include no anxiety, chest pain, headaches or palpitations. There are no associated agents to hypertension. Risk factors for coronary artery disease include obesity and male gender. Past treatments include ACE inhibitors and alpha 1 blockers. The current treatment provides moderate improvement. There are no compliance problems.  There is no history of angina or kidney disease. There is no history of chronic renal disease.   ° °Past Medical History:  °Diagnosis Date  ° DVT of lower extremity, bilateral (HCC) 06/17/2012  ° Right CFV and popliteal vein, left C. it the only;  also interstitial fluid noted in both calfs  ° Dyslipidemia   ° History of echocardiogram 02/07/2011  ° EF 50-60%; RV mildly dilated, LA moderately dilated; mild MR; mild-mod TR; RV systolic pressure elevated; mildly sclerotic AV  ° History of nuclear stress test 02/07/2011  ° lexiscan; mild ischemia in mid inferolateral & apical lateral regions; low risk scan  ° History of: Bacteremia due to coagulase-negative Staphylococcus 06/13/2012  ° During hospitalization May 30 through 06/19/2012   ° History: TTP (thrombotic thrombocytopenic purpura) 06/11/2012  ° Hypertension   ° Obesity (BMI 30-39.9)   ° OSA on CPAP   ° Persistent atrial fibrillation (HCC) 02/2011  ° Negative Myoview; Normal EF by Echo  ° Severe sepsis with acute organ dysfunction (HCC) 5/30-6/12/2012  ° Prolonged hospitalization for presumed sepsis and septic shock compounded by coagulopathy thought to be TTP versus Pradaxa mediated. Temporarily on hemodialysis and plasmapheresis.  °  ° °Family History  °  I,Tianna Badgett,acting as a Education administrator for [employer], FNP.,have documented all relevant documentation on the behalf of [name], FNP,as directed by  [name], FNP while in the presence of [name], [place].  This visit occurred during the SARS-CoV-2 public health emergency.  Safety protocols were in place, including screening questions prior to the visit, additional usage of staff PPE, and extensive cleaning of exam room while observing appropriate contact time as indicated for disinfecting solutions.  Subjective:     Patient ID: Brett [name] , male    DOB: 1945-07-16 , 76 y.o.   MRN: [id number]   Chief Complaint  Patient presents with   Diabetes    HPI  Pr presents today for BP & DM f/u. Continues to do well with the medication.  Continues to go to the Coumadin clinic went this morning - he is to take 2 tablets today and then continue taking 1.5 tablets daily except 1 tablet on Sunday.  Repeat INR in 4 weeks per the note in his chart.  Diabetes He presents for his follow-up diabetic visit. He has type 2 diabetes mellitus. His disease course has been stable. Pertinent negatives for hypoglycemia include no dizziness or headaches. Pertinent negatives for diabetes include no chest pain. There are no hypoglycemic complications. There are no diabetic complications. Risk factors for coronary artery disease include sedentary lifestyle, obesity, male sex, hypertension and diabetes mellitus. Current diabetic treatment includes oral agent (dual therapy). He is compliant with treatment all of the time. He is following a generally unhealthy diet. When asked about meal planning, he reported none. He has not had a previous visit with a dietitian. He rarely participates in exercise. There is no change in his home blood glucose trend. (Blood sugar averaging 140 after eating and 2 hours after 130) He does not see a podiatrist. Hypertension This is a chronic problem. The current episode started  more than 1 year ago. The problem has been gradually worsening since onset. The problem is uncontrolled. Pertinent negatives include no anxiety, chest pain, headaches or palpitations. There are no associated agents to hypertension. Risk factors for coronary artery disease include obesity and male gender. Past treatments include ACE inhibitors and alpha 1 blockers. The current treatment provides moderate improvement. There are no compliance problems.  There is no history of angina or kidney disease. There is no history of chronic renal disease.    Past Medical History:  Diagnosis Date   DVT of lower extremity, bilateral ([place]) 06/17/2012   Right CFV and popliteal vein, left C. it the only;  also interstitial fluid noted in both calfs   Dyslipidemia    History of echocardiogram 02/07/2011   EF 50-60%; RV mildly dilated, LA moderately dilated; mild MR; mild-mod TR; RV systolic pressure elevated; mildly sclerotic AV   History of nuclear stress test 02/07/2011   lexiscan; mild ischemia in mid inferolateral & apical lateral regions; low risk scan   History of: Bacteremia due to coagulase-negative Staphylococcus 06/13/2012   During hospitalization May 30 through 06/19/2012    History: TTP (thrombotic thrombocytopenic purpura) 06/11/2012   Hypertension    Obesity (BMI 30-39.9)    OSA on CPAP    Persistent atrial fibrillation ([name]) 02/2011   Negative Myoview; Normal EF by Echo   Severe sepsis with acute organ dysfunction ([place]) 5/30-6/12/2012   Prolonged hospitalization for presumed sepsis and septic shock compounded by coagulopathy thought to be TTP versus Pradaxa mediated. Temporarily on hemodialysis and plasmapheresis.     Family

## 2021-02-15 NOTE — Patient Instructions (Signed)

## 2021-02-15 NOTE — Patient Instructions (Signed)
Take 2 tablets today and then continue taking 1.5 tablets daily except 1 tablet on Sunday.  Repeat INR in 4 weeks.  [phone]

## 2021-02-16 DIAGNOSIS — E119 Type 2 diabetes mellitus without complications: Secondary | ICD-10-CM | POA: Diagnosis not present

## 2021-02-16 LAB — POCT URINALYSIS DIPSTICK
Bilirubin, UA: NEGATIVE
Blood, UA: NEGATIVE
Glucose, UA: POSITIVE — AB
Ketones, UA: NEGATIVE
Leukocytes, UA: NEGATIVE
Nitrite, UA: NEGATIVE
Protein, UA: NEGATIVE
Spec Grav, UA: 1.015 (ref 1.010–1.025)
Urobilinogen, UA: 0.2 E.U./dL
pH, UA: 7 (ref 5.0–8.0)

## 2021-02-16 NOTE — Addendum Note (Signed)
Addended by: Michelle Nasuti on: 02/16/2021 03:32 PM   Modules accepted: Orders

## 2021-02-17 LAB — LIPID PANEL
Chol/HDL Ratio: 4.7 ratio (ref 0.0–5.0)
Cholesterol, Total: 122 mg/dL (ref 100–199)
HDL: 26 mg/dL — ABNORMAL LOW (ref >39)
LDL Chol Calc (NIH): 67 mg/dL (ref 0–99)
Triglycerides: 169 mg/dL — ABNORMAL HIGH (ref 0–149)
VLDL Cholesterol Cal: 29 mg/dL (ref 5–40)

## 2021-02-17 LAB — BMP8+EGFR
BUN/Creatinine Ratio: 16 (ref 10–24)
BUN: 21 mg/dL (ref 8–27)
CO2: 23 mmol/L (ref 20–29)
Calcium: 9.5 mg/dL (ref 8.6–10.2)
Chloride: 99 mmol/L (ref 96–106)
Creatinine, Ser: 1.29 mg/dL — ABNORMAL HIGH (ref 0.76–1.27)
Glucose: 92 mg/dL (ref 70–99)
Potassium: 4.8 mmol/L (ref 3.5–5.2)
Sodium: 143 mmol/L (ref 134–144)
eGFR: 57 mL/min/{1.73_m2} — ABNORMAL LOW (ref >59)

## 2021-02-17 LAB — HEMOGLOBIN A1C
Est. average glucose Bld gHb Est-mCnc: 126 mg/dL
Hgb A1c MFr Bld: 6 % — ABNORMAL HIGH (ref 4.8–5.6)

## 2021-02-17 LAB — MICROALBUMIN / CREATININE URINE RATIO
Creatinine, Urine: 39 mg/dL
Microalb/Creat Ratio: 77 mg/g creat — ABNORMAL HIGH (ref 0–29)
Microalbumin, Urine: 30.2 ug/mL

## 2021-03-02 ENCOUNTER — Other Ambulatory Visit: Payer: Self-pay | Admitting: Cardiology

## 2021-03-13 DIAGNOSIS — L97919 Non-pressure chronic ulcer of unspecified part of right lower leg with unspecified severity: Secondary | ICD-10-CM | POA: Diagnosis not present

## 2021-03-13 DIAGNOSIS — Z23 Encounter for immunization: Secondary | ICD-10-CM | POA: Diagnosis not present

## 2021-03-13 DIAGNOSIS — L0100 Impetigo, unspecified: Secondary | ICD-10-CM | POA: Diagnosis not present

## 2021-03-13 DIAGNOSIS — Z85828 Personal history of other malignant neoplasm of skin: Secondary | ICD-10-CM | POA: Diagnosis not present

## 2021-03-13 DIAGNOSIS — L97929 Non-pressure chronic ulcer of unspecified part of left lower leg with unspecified severity: Secondary | ICD-10-CM | POA: Diagnosis not present

## 2021-03-15 ENCOUNTER — Other Ambulatory Visit: Payer: Self-pay

## 2021-03-15 ENCOUNTER — Ambulatory Visit (INDEPENDENT_AMBULATORY_CARE_PROVIDER_SITE_OTHER): Payer: Medicare Other

## 2021-03-15 DIAGNOSIS — I4819 Other persistent atrial fibrillation: Secondary | ICD-10-CM | POA: Diagnosis not present

## 2021-03-15 DIAGNOSIS — Z5181 Encounter for therapeutic drug level monitoring: Secondary | ICD-10-CM

## 2021-03-15 DIAGNOSIS — Z7901 Long term (current) use of anticoagulants: Secondary | ICD-10-CM

## 2021-03-15 LAB — POCT INR: INR: 3 (ref 2.0–3.0)

## 2021-03-15 NOTE — Patient Instructions (Signed)
continue taking 1.5 tablets daily except 1 tablet on Sunday.  Repeat INR in 8 weeks.  [phone] ?

## 2021-03-20 DIAGNOSIS — L97919 Non-pressure chronic ulcer of unspecified part of right lower leg with unspecified severity: Secondary | ICD-10-CM | POA: Diagnosis not present

## 2021-03-20 DIAGNOSIS — R234 Changes in skin texture: Secondary | ICD-10-CM | POA: Diagnosis not present

## 2021-03-27 DIAGNOSIS — L97919 Non-pressure chronic ulcer of unspecified part of right lower leg with unspecified severity: Secondary | ICD-10-CM | POA: Diagnosis not present

## 2021-03-27 DIAGNOSIS — R234 Changes in skin texture: Secondary | ICD-10-CM | POA: Diagnosis not present

## 2021-04-26 DIAGNOSIS — H0102B Squamous blepharitis left eye, upper and lower eyelids: Secondary | ICD-10-CM | POA: Diagnosis not present

## 2021-04-26 DIAGNOSIS — H0102A Squamous blepharitis right eye, upper and lower eyelids: Secondary | ICD-10-CM | POA: Diagnosis not present

## 2021-04-26 DIAGNOSIS — H43813 Vitreous degeneration, bilateral: Secondary | ICD-10-CM | POA: Diagnosis not present

## 2021-04-26 DIAGNOSIS — H26493 Other secondary cataract, bilateral: Secondary | ICD-10-CM | POA: Diagnosis not present

## 2021-04-26 DIAGNOSIS — Z961 Presence of intraocular lens: Secondary | ICD-10-CM | POA: Diagnosis not present

## 2021-04-26 DIAGNOSIS — H04123 Dry eye syndrome of bilateral lacrimal glands: Secondary | ICD-10-CM | POA: Diagnosis not present

## 2021-04-26 LAB — HM DIABETES EYE EXAM

## 2021-04-26 NOTE — Progress Notes (Addendum)
Burger, Brett L. ([id number]) ?Visit Report for 04/27/2021 ?Allergy List Details ?Patient Name: Date of Service: ?[name], [name] 04/27/2021 1:15 PM ?Medical Record Number: [id number] ?Patient Account Number: [id number] ?Date of Birth/Sex: Treating RN: ?Jun 16, 1945 (76 y.o. Brett [name] ?Primary Care Yetzali Weld: [name] Other Clinician: ?Referring Chau Savell: ?Treating Emmy Keng/Extender: Kalman Shan ?[name] ?Weeks in Treatment: 0 ?Allergies ?Active Allergies ?No Known Drug Allergies ?Allergy Notes ?Electronic Signature(s) ?Signed: 04/27/2021 4:55:52 PM By: Lorrin Jackson ?Previous Signature: 04/26/2021 8:00:00 AM Version By: Lorrin Jackson ?Entered By: Lorrin Jackson on 04/27/2021 13:41:45 ?-------------------------------------------------------------------------------- ?Arrival Information Details ?Patient Name: Date of Service: ?Brett [name], [name] 04/27/2021 1:15 PM ?Medical Record Number: [id number] ?Patient Account Number: [id number] ?Date of Birth/Sex: Treating RN: ?October 22, 1945 (76 y.o. Brett [name] ?Primary Care Antonya Leeder: [name] Other Clinician: ?Referring Daune Colgate: ?Treating Geovani Tootle/Extender: Kalman Shan ?[name] ?Weeks in Treatment: 0 ?Visit Information ?Patient Arrived: Brett [name] ?Arrival Time: 13:37 ?Transfer Assistance: None ?Patient Identification Verified: Yes ?Secondary Verification Process Completed: Yes ?Patient Requires Transmission-Based Precautions: No ?Patient Has Alerts: Yes ?Patient Alerts: Patient on Blood Thinner ?11/18/20 ABI's= [place] ?11/18/20 TBI R=.79 L=.82 ?History Since Last Visit ?Added or deleted any medications: No ?Any new allergies or adverse reactions: No ?Had a fall or experienced change in activities of daily living that may affect risk of falls: No ?Signs or symptoms of abuse/neglect since last visito No ?Hospitalized since last visit: No ?Implantable device outside of the clinic excluding cellular tissue based products placed in the center  since last visit: No ?Has Dressing in Place as Prescribed: Yes ?Pain Present Now: No ?Electronic Signature(s) ?Signed: 04/27/2021 2:20:45 PM By: Lorrin Jackson ?Entered By: Lorrin Jackson on 04/27/2021 14:20:45 ?-------------------------------------------------------------------------------- ?Clinic Level of Care Assessment Details ?Patient Name: Date of Service: ?Brett [name], [name] 04/27/2021 1:15 PM ?Medical Record Number: [id number] ?Patient Account Number: [id number] ?Date of Birth/Sex: Treating RN: ?05-05-1945 (76 y.o. Brett [name] ?Primary Care Ritvik Mczeal: [name] Other Clinician: ?Referring Dylynn Ketner: ?Treating Keri Tavella/Extender: Kalman Shan ?[name] ?Weeks in Treatment: 0 ?Clinic Level of Care Assessment Items ?TOOL 1 Quantity Score ?X- 1 0 ?Use when EandM and Procedure is performed on INITIAL visit ?ASSESSMENTS - Nursing Assessment / Reassessment ?X- 1 20 ?General Physical Exam (combine w/ comprehensive assessment (listed just below) when performed on new pt. evals) ?X- 1 25 ?Comprehensive Assessment (HX, ROS, Risk Assessments, Wounds Hx, etc.) ?ASSESSMENTS - Wound and Skin Assessment / Reassessment ?'[]'$  - 0 ?Dermatologic / Skin Assessment (not related to wound area) ?ASSESSMENTS - Ostomy and/or Continence Assessment and Care ?'[]'$  - 0 ?Incontinence Assessment and Management ?'[]'$  - 0 ?Ostomy Care Assessment and Management (repouching, etc.) ?PROCESS - Coordination of Care ?'[]'$  - 0 ?Simple Patient / Family Education for ongoing care ?X- 1 20 ?Complex (extensive) Patient / Family Education for ongoing care ?X- 1 10 ?Staff obtains Consents, Records, T Results / Process Orders ?est ?'[]'$  - 0 ?Staff telephones HHA, Nursing Homes / Clarify orders / etc ?'[]'$  - 0 ?Routine Transfer to another Facility (non-emergent condition) ?'[]'$  - 0 ?Routine Hospital Admission (non-emergent condition) ?'[]'$  - 0 ?New Admissions / Biomedical engineer / Ordering NPWT Apligraf, etc. ?, ?'[]'$  - 0 ?Emergency Hospital  Admission (emergent condition) ?PROCESS - Special Needs ?'[]'$  - 0 ?Pediatric / Minor Patient Management ?'[]'$  - 0 ?Isolation Patient Management ?'[]'$  - 0 ?Hearing / Language / Visual special needs ?'[]'$  - 0 ?Assessment of Community assistance (transportation, D/C planning, etc.) ?'[]'$  - 0 ?Additional assistance / Altered mentation ?'[]'$  - 0 ?  Support Surface(s) Assessment (bed, cushion, seat, etc.) ?INTERVENTIONS - Miscellaneous ?'[]'$  - 0 ?External ear exam ?'[]'$  - 0 ?Patient Transfer (multiple staff / Civil Service fast streamer / Similar devices) ?'[]'$  - 0 ?Simple Staple / Suture removal (25 or less) ?'[]'$  - 0 ?Complex Staple / Suture removal (26 or more) ?'[]'$  - 0 ?Hypo/Hyperglycemic Management (do not check if billed separately) ?'[]'$  - 0 ?Ankle / Brachial Index (ABI) - do not check if billed separately ?Has the patient been seen at the hospital within the last three years: Yes ?Total Score: 75 ?Level Of Care: New/Established - Level 2 ?Electronic Signature(s) ?Signed: 04/27/2021 4:55:52 PM By: Lorrin Jackson ?Signed: 04/27/2021 4:55:52 PM By: Lorrin Jackson ?Entered By: Lorrin Jackson on 04/27/2021 14:36:15 ?-------------------------------------------------------------------------------- ?Encounter Discharge Information Details ?Patient Name: ?Date of Service: ?[name], [name] 04/27/2021 1:15 PM ?Medical Record Number: [id number] ?Patient Account Number: [id number] ?Date of Birth/Sex: ?Treating RN: ?May 30, 1945 (76 y.o. Brett [name] ?Primary Care Shir Bergman: [name] ?Other Clinician: ?Referring Paxtyn Wisdom: ?Treating Lexi Conaty/Extender: Kalman Shan ?[name] ?Weeks in Treatment: 0 ?Encounter Discharge Information Items Post Procedure Vitals ?Discharge Condition: Stable ?Temperature (F): 98 ?Ambulatory Status: Brett [name] ?Pulse (bpm): 69 ?Discharge Destination: Home ?Respiratory Rate (breaths/min): 20 ?Transportation: Private Auto ?Blood Pressure (mmHg): 145/88 ?Schedule Follow-up Appointment: Yes ?Clinical Summary of Care: Provided on  04/27/2021 ?Form Type Recipient ?Paper Patient Patient ?Electronic Signature(s) ?Signed: 04/27/2021 4:55:52 PM By: Lorrin Jackson ?Entered By: Lorrin Jackson on 04/27/2021 14:54:43 ?-------------------------------------------------------------------------------- ?Lower Extremity Assessment Details ?Patient Name: ?Date of Service: ?Brett [name], [name] 04/27/2021 1:15 PM ?Medical Record Number: [id number] ?Patient Account Number: [id number] ?Date of Birth/Sex: ?Treating RN: ?[birth year] (76 y.o. Brett [name] ?Primary Care Cosima Prentiss: [name] ?Other Clinician: ?Referring Jayten Gabbard: ?Treating Jaydon Soroka/Extender: Kalman Shan ?[name] ?Weeks in Treatment: 0 ?Edema Assessment ?Assessed: [Left: Yes] [Right: Yes] ?Edema: [Left: Yes] [Right: Yes] ?Calf ?Left: Right: ?Point of Measurement: 34 cm From Medial Instep 44.4 cm 42.5 cm ?Ankle ?Left: Right: ?Point of Measurement: 10 cm From Medial Instep 26 cm 25.5 cm ?Knee To Floor ?Left: Right: ?From Medial Instep 44 cm 44 cm ?Vascular Assessment ?Pulses: ?Dorsalis Pedis ?Palpable: [Left:Yes] [Right:Yes] ?Notes ?[facility] 11/18/20 ABI= R and L:[place] TBI=R: 0.79 L: 0.82 ?Electronic Signature(s) ?Signed: 04/27/2021 4:55:52 PM By: Lorrin Jackson ?Entered By: Lorrin Jackson on 04/27/2021 13:51:15 ?-------------------------------------------------------------------------------- ?Multi Wound Chart Details ?Patient Name: ?Date of Service: ?[place], [name] 04/27/2021 1:15 PM ?Medical Record Number: [id number] ?Patient Account Number: [id number] ?Date of Birth/Sex: ?Treating RN: ?[birth year] (76 y.o. M) ?Primary Care Anaisabel Pederson: [name] ?Other Clinician: ?Referring Kerah Hardebeck: ?Treating Essam Lowdermilk/Extender: Kalman Shan ?[name] ?Weeks in Treatment: 0 ?Vital Signs ?Height(in): 71 ?Pulse(bpm): 69 ?Weight(lbs): 302 ?Blood Pressure(mmHg): 145/88 ?Body Mass Index(BMI): 42.1 ?Temperature(??F): 98 ?Respiratory Rate(breaths/min): 20 ?Photos: ?Left, Medial Ankle Left, Anterior  Lower Leg Right, Distal, Lateral Lower Leg ?Wound Location: ?Gradually Appeared Gradually Appeared Gradually Appeared ?Wounding Event: ?Venous Leg Ulcer Venous Leg Ulcer Venous Leg Ulcer ?Primary Etiology: ?Sleep Apnea

## 2021-04-27 ENCOUNTER — Encounter (HOSPITAL_BASED_OUTPATIENT_CLINIC_OR_DEPARTMENT_OTHER): Payer: Medicare Other | Attending: Internal Medicine | Admitting: Internal Medicine

## 2021-04-27 DIAGNOSIS — L97822 Non-pressure chronic ulcer of other part of left lower leg with fat layer exposed: Secondary | ICD-10-CM | POA: Diagnosis not present

## 2021-04-27 DIAGNOSIS — L97812 Non-pressure chronic ulcer of other part of right lower leg with fat layer exposed: Secondary | ICD-10-CM

## 2021-04-27 DIAGNOSIS — Z7901 Long term (current) use of anticoagulants: Secondary | ICD-10-CM | POA: Insufficient documentation

## 2021-04-27 DIAGNOSIS — E11622 Type 2 diabetes mellitus with other skin ulcer: Secondary | ICD-10-CM | POA: Diagnosis not present

## 2021-04-27 DIAGNOSIS — I4819 Other persistent atrial fibrillation: Secondary | ICD-10-CM | POA: Diagnosis not present

## 2021-04-27 DIAGNOSIS — I872 Venous insufficiency (chronic) (peripheral): Secondary | ICD-10-CM | POA: Diagnosis not present

## 2021-04-27 DIAGNOSIS — L97811 Non-pressure chronic ulcer of other part of right lower leg limited to breakdown of skin: Secondary | ICD-10-CM | POA: Insufficient documentation

## 2021-04-27 DIAGNOSIS — I890 Lymphedema, not elsewhere classified: Secondary | ICD-10-CM

## 2021-04-27 DIAGNOSIS — L97921 Non-pressure chronic ulcer of unspecified part of left lower leg limited to breakdown of skin: Secondary | ICD-10-CM | POA: Diagnosis not present

## 2021-04-27 NOTE — Progress Notes (Signed)
[name], [name] ([id number]) ?Visit Report for 04/27/2021 ?Abuse Risk Screen Details ?Patient Name: Date of Service: ?[place], [name] 04/27/2021 1:15 PM ?Medical Record Number: [id number] ?Patient Account Number: [id number] ?Date of Birth/Sex: Treating RN: ?1945/11/07 (76 y.o. Brett [name] ?Primary Care Sinclair Alligood: [name] Other Clinician: ?Referring Lasasha Brophy: ?Treating Juventino Pavone/Extender: Kalman Shan ?[name] ?Weeks in Treatment: 0 ?Abuse Risk Screen Items ?Answer ?ABUSE RISK SCREEN: ?Has anyone close to you tried to hurt or harm you recentlyo No ?Do you feel uncomfortable with anyone in your familyo No ?Has anyone forced you do things that you didnt want to doo No ?Electronic Signature(s) ?Signed: 04/27/2021 4:55:52 PM By: Lorrin Jackson ?Entered By: Lorrin Jackson on 04/27/2021 13:43:16 ?-------------------------------------------------------------------------------- ?Activities of Daily Living Details ?Patient Name: Date of Service: ?[place], [name] 04/27/2021 1:15 PM ?Medical Record Number: [id number] ?Patient Account Number: [id number] ?Date of Birth/Sex: Treating RN: ?09-Sep-1945 (76 y.o. Brett [name] ?Primary Care Legacy Lacivita: [name] Other Clinician: ?Referring Dayzha Pogosyan: ?Treating Francis Doenges/Extender: Kalman Shan ?[name] ?Weeks in Treatment: 0 ?Activities of Daily Living Items ?Answer ?Activities of Daily Living (Please select one for each item) ?[place] ?T Medications ?ake Completely Able ?Use T elephone Completely Able ?Care for Appearance Completely Able ?Use T oilet Completely Able ?Bath / Shower Completely Able ?Dress Self Completely Able ?Feed Self Completely Able ?Walk Completely Able ?Get In / Out Bed Completely Able ?Housework Completely Able ?Prepare Meals Completely Able ?Handle Money Completely Able ?Shop for Self Completely Able ?Electronic Signature(s) ?Signed: 04/27/2021 4:55:52 PM By: Lorrin Jackson ?Entered By: Lorrin Jackson on 04/27/2021 13:43:38 ?-------------------------------------------------------------------------------- ?Education Screening Details ?Patient Name: ?Date of Service: ?[place], [name] 04/27/2021 1:15 PM ?Medical Record Number: [id number] ?Patient Account Number: [id number] ?Date of Birth/Sex: ?Treating RN: ?1946-01-05 (76 y.o. Brett [name] ?Primary Care Harlo Jaso: [name] ?Other Clinician: ?Referring Adaeze Better: ?Treating Miquela Costabile/Extender: Kalman Shan ?[name] ?Weeks in Treatment: 0 ?Primary Learner Assessed: Patient ?Learning Preferences/Education Level/Primary Language ?Learning Preference: Explanation, Demonstration, Printed Material ?Highest Education Level: College or Above ?Preferred Language: English ?Cognitive Barrier ?Language Barrier: No ?Translator Needed: No ?Memory Deficit: No ?Emotional Barrier: No ?Cultural/Religious Beliefs Affecting Medical Care: No ?Physical Barrier ?Impaired Vision: No ?Impaired Hearing: No ?Decreased Hand dexterity: No ?Knowledge/Comprehension ?Knowledge Level: High ?Comprehension Level: High ?Ability to understand written instructions: High ?Ability to understand verbal instructions: High ?Motivation ?Anxiety Level: Calm ?Cooperation: Cooperative ?Education Importance: Acknowledges Need ?Interest in Health Problems: Asks Questions ?Perception: Coherent ?Willingness to Engage in Self-Management High ?Activities: ?Readiness to Engage in Self-Management High ?Activities: ?Electronic Signature(s) ?Signed: 04/27/2021 4:55:52 PM By: Lorrin Jackson ?Entered By: Lorrin Jackson on 04/27/2021 13:44:15 ?-------------------------------------------------------------------------------- ?Fall Risk Assessment Details ?Patient Name: ?Date of Service: ?[place], [name] 04/27/2021 1:15 PM ?Medical Record Number: [id number] ?Patient Account Number: [id number] ?Date of Birth/Sex: ?Treating RN: ?1945-01-20 (76 y.o. Brett [name] ?Primary Care Shalisa Mcquade: [name] ?Other Clinician: ?Referring Sila Sarsfield: ?Treating Temitope Griffing/Extender: Kalman Shan ?[name] ?Weeks in Treatment: 0 ?Fall Risk Assessment Items ?Have you had 2 or more falls in the last 12 monthso 0 No ?Have you had any fall that resulted in injury in the last 12 monthso 0 No ?FALLS RISK SCREEN ?History of falling - immediate or within 3 months 0 No ?Secondary diagnosis (Do you have 2 or more medical diagnoseso) 15 Yes ?Ambulatory aid ?None/bed rest/wheelchair/nurse 0 No ?Crutches/cane/walker 15 Yes ?Furniture 0 No ?Intravenous therapy Access/Saline/Heparin Lock 0 No ?Gait/Transferring ?Normal/ bed rest/ wheelchair 0 Yes ?Weak (  short steps with or without shuffle, stooped but able to lift head while walking, may seek 0 No ?support from furniture) ?Impaired (short steps with shuffle, may have difficulty arising from chair, head down, impaired 0 No ?balance) ?Mental Status ?Oriented to own ability 0 Yes ?Electronic Signature(s) ?Signed: 04/27/2021 4:55:52 PM By: Lorrin Jackson ?Entered By: Lorrin Jackson on 04/27/2021 13:44:29 ?-------------------------------------------------------------------------------- ?Foot Assessment Details ?Patient Name: ?Date of Service: ?[place], [name] 04/27/2021 1:15 PM ?Medical Record Number: [id number] ?Patient Account Number: [id number] ?Date of Birth/Sex: ?Treating RN: ?1945-07-22 (76 y.o. Brett [name] ?Primary Care Aidenjames Heckmann: [name] ?Other Clinician: ?Referring Louella Medaglia: ?Treating Zaleah Ternes/Extender: Kalman Shan ?[name] ?Weeks in Treatment: 0 ?Foot Assessment Items ?Site Locations ?+ = Sensation present, - = Sensation absent, C = Callus, U = Ulcer ?R = Redness, W = Warmth, M = Maceration, PU = Pre-ulcerative lesion ?F = Fissure, S = Swelling, D = Dryness ?Assessment ?Right: Left: ?Other Deformity: No No ?Prior Foot Ulcer: No No ?Prior Amputation: No No ?Charcot Joint: No No ?Ambulatory Status: Ambulatory With Help ?Assistance Device: Cane ?Gait:  Steady ?Electronic Signature(s) ?Signed: 04/27/2021 4:55:52 PM By: Lorrin Jackson ?Entered By: Lorrin Jackson on 04/27/2021 13:47:41 ?-------------------------------------------------------------------------------- ?Nutrition Risk Screening Details ?Patient Name: ?Date of Service: ?[place], [name] 04/27/2021 1:15 PM ?Medical Record Number: [id number] ?Patient Account Number: [id number] ?Date of Birth/Sex: ?Treating RN: ?02/21/1945 (76 y.o. Brett [name] ?Primary Care Jaxen Samples: [name] ?Other Clinician: ?Referring Angi Goodell: ?Treating Maximum Reiland/Extender: Kalman Shan ?[name] ?Weeks in Treatment: 0 ?Height (in): 71 ?Weight (lbs): 302 ?Body Mass Index (BMI): 42.1 ?Nutrition Risk Screening Items ?Score Screening ?NUTRITION RISK SCREEN: ?I have an illness or condition that made me change the kind and/or amount of food I eat 0 No ?I eat fewer than two meals per day 0 No ?I eat few fruits and vegetables, or milk products 0 No ?I have three or more drinks of beer, liquor or wine almost every day 0 No ?I have tooth or mouth problems that make it hard for me to eat 0 No ?I don't always have enough money to buy the food I need 0 No ?I eat alone most of the time 0 No ?I take three or more different prescribed or over-the-counter drugs a day 1 Yes ?Without wanting to, I have lost or gained 10 pounds in the last six months 0 No ?I am not always physically able to shop, cook and/or feed myself 0 No ?Nutrition Protocols ?Good Risk Protocol 0 No interventions needed ?Moderate Risk Protocol ?High Risk Proctocol ?Risk Level: Good Risk ?Score: 1 ?Electronic Signature(s) ?Signed: 04/27/2021 4:55:52 PM By: Lorrin Jackson ?Entered By: Lorrin Jackson on 04/27/2021 13:44:40 ?

## 2021-04-27 NOTE — Progress Notes (Signed)
Brett [name], Brett L. ([id number]) ?Visit Report for 04/27/2021 ?Chief Complaint Document Details ?Patient Name: Date of Service: ?[place], [name] 04/27/2021 1:15 PM ?Medical Record Number: [id number] ?Patient Account Number: [id number] ?Date of Birth/Sex: Treating RN: ?07/07/1945 (76 y.o. M) ?Primary Care Provider: [name] Other Clinician: ?Referring Provider: ?Treating Provider/Extender: Kalman Shan ?[name] ?Weeks in Treatment: 0 ?Information Obtained from: Patient ?Chief Complaint ?Bilateral lower extremity wounds ?Electronic Signature(s) ?Signed: 04/27/2021 3:46:31 PM By: Kalman Shan DO ?Entered By: Kalman Shan on 04/27/2021 14:36:35 ?-------------------------------------------------------------------------------- ?Debridement Details ?Patient Name: Date of Service: ?[name], [name] 04/27/2021 1:15 PM ?Medical Record Number: [id number] ?Patient Account Number: [id number] ?Date of Birth/Sex: Treating RN: ?09-07-1945 (76 y.o. [name] ?Primary Care Provider: [name] Other Clinician: ?Referring Provider: ?Treating Provider/Extender: Kalman Shan ?[name] ?Weeks in Treatment: 0 ?Debridement Performed for Assessment: Wound #4 Left,Medial Ankle ?Performed By: Physician Kalman Shan, DO ?Debridement Type: Chemical/Enzymatic/Mechanical ?Agent Used: gauze and wound cleanser ?Severity of Tissue Pre Debridement: Fat layer exposed ?Level of Consciousness (Pre-procedure): Awake and Alert ?Pre-procedure Verification/Time Out Yes - 14:24 ?Taken: ?Start Time: 14:25 ?Bleeding: None ?End Time: 14:27 ?Response to Treatment: Procedure was tolerated well ?Level of Consciousness (Post- Awake and Alert ?procedure): ?Post Debridement Measurements of Total Wound ?Length: (cm) 0.7 ?Width: (cm) 0.5 ?Depth: (cm) 0.2 ?Volume: (cm?) 0.055 ?Character of Wound/Ulcer Post Debridement: Stable ?Severity of Tissue Post Debridement: Fat layer exposed ?Post Procedure Diagnosis ?Same as  Pre-procedure ?Electronic Signature(s) ?Signed: 04/27/2021 3:46:31 PM By: Kalman Shan DO ?Signed: 04/27/2021 4:55:52 PM By: Lorrin Jackson ?Entered By: Lorrin Jackson on 04/27/2021 14:28:05 ?-------------------------------------------------------------------------------- ?Debridement Details ?Patient Name: ?Date of Service: ?[name], [name] 04/27/2021 1:15 PM ?Medical Record Number: [id number] ?Patient Account Number: [id number] ?Date of Birth/Sex: ?Treating RN: ?[birth year] (76 y.o. [name] ?Primary Care Provider: [name] ?Other Clinician: ?Referring Provider: ?Treating Provider/Extender: Kalman Shan ?[name] ?Weeks in Treatment: 0 ?Debridement Performed for Assessment: Wound #6 Right,Distal,Lateral Lower Leg ?Performed By: Physician Kalman Shan, DO ?Debridement Type: Chemical/Enzymatic/Mechanical ?Agent Used: gauze and wound cleanser ?Severity of Tissue Pre Debridement: Fat layer exposed ?Level of Consciousness (Pre-procedure): Awake and Alert ?Pre-procedure Verification/Time Out Yes - 14:24 ?Taken: ?Start Time: 14:25 ?Bleeding: None ?End Time: 14:27 ?Response to Treatment: Procedure was tolerated well ?Level of Consciousness (Post- Awake and Alert ?procedure): ?Post Debridement Measurements of Total Wound ?Length: (cm) 0.8 ?Width: (cm) 0.6 ?Depth: (cm) 0.1 ?Volume: (cm?) 0.038 ?Character of Wound/Ulcer Post Debridement: Stable ?Severity of Tissue Post Debridement: Fat layer exposed ?Post Procedure Diagnosis ?Same as Pre-procedure ?Electronic Signature(s) ?Signed: 04/27/2021 3:46:31 PM By: Kalman Shan DO ?Signed: 04/27/2021 4:55:52 PM By: Lorrin Jackson ?Entered By: Lorrin Jackson on 04/27/2021 14:29:43 ?-------------------------------------------------------------------------------- ?Debridement Details ?Patient Name: ?Date of Service: ?[name], [name] 04/27/2021 1:15 PM ?Medical Record Number: [id number] ?Patient Account Number: [id number] ?Date of  Birth/Sex: ?Treating RN: ?16-Jun-1945 (76 y.o. [name] ?Primary Care Provider: [name] ?Other Clinician: ?Referring Provider: ?Treating Provider/Extender: Kalman Shan ?[name] ?Weeks in Treatment: 0 ?Debridement Performed for Assessment: Wound #7 Right,Proximal,Lateral Lower Leg ?Performed By: Physician Kalman Shan, DO ?Debridement Type: Chemical/Enzymatic/Mechanical ?Agent Used: gauze and wound cleanser ?Severity of Tissue Pre Debridement: Fat layer exposed ?Level of Consciousness (Pre-procedure): Awake and Alert ?Pre-procedure Verification/Time Out Yes - 14:24 ?Taken: ?Start Time: 14:25 ?Bleeding: None ?End Time: 14:27 ?Response to Treatment: Procedure was tolerated well ?Level of Consciousness (Post- Awake and Alert ?procedure): ?Post Debridement Measurements of Total Wound ?Length: (cm) 1.7 ?Width: (cm) 2.2 ?Depth: (cm)  0.1 ?Volume: (cm?) 0.294 ?Character of Wound/Ulcer Post Debridement: Stable ?Severity of Tissue Post Debridement: Fat layer exposed ?Post Procedure Diagnosis ?Same as Pre-procedure ?Electronic Signature(s) ?Signed: 04/27/2021 3:46:31 PM By: Kalman Shan DO ?Signed: 04/27/2021 4:55:52 PM By: Lorrin Jackson ?Entered By: Lorrin Jackson on 04/27/2021 14:30:18 ?-------------------------------------------------------------------------------- ?HPI Details ?Patient Name: Date of Service: ?[name], [name] 04/27/2021 1:15 PM ?Medical Record Number: [id number] ?Patient Account Number: [id number] ?Date of Birth/Sex: Treating RN: ?1945/08/15 (76 y.o. M) ?Primary Care Provider: [name] Other Clinician: ?Referring Provider: ?Treating Provider/Extender: Kalman Shan ?[name] ?Weeks in Treatment: 0 ?History of Present Illness ?HPI Description: Admission 11/01/2020 ?[name] is a 76 year old male with a past medical history of controlled type 2 diabetes on oral medications, chronic venous insufficiency and ?persistent A. fib on Coumadin that presents  to the clinic for A 1 year history of waxing and waning of bilateral lower extremity wounds. He states that his ?wounds opened up spontaneously. They have improved over the past 6 months. He was doing [employer] in August and September which helped heal his ?wounds. They have opened up again and he is using lotion and compression stockings. He currently denies signs of infection. ?11/3; patient presents for follow-up. He has no issues or complaints today. He has been using his juxta light compression daily with Hydrofera Blue underneath. ?He denies signs of infection. ?11/8; patient presents for follow-up. He states that on removal of the Hydrofera Blue dressing the skin tore due to being tightly adhered to the wound bed. He ?has been using his juxta light compressions daily. He denies signs of infection. ?Readmission 04/27/2021 ?Patient presents with a 3-monthhistory of opening scattered wounds to his legs bilaterally. He was previously seen in our clinic for this issue last year. His last ?encounter was on 11/8 and he still had wounds at that time. He never followed up afterwards. He states that at one point all his wounds had healed up. He ?reports using his juxta lite compressions daily but thinks that the elasticity has worn out. He is currently been keeping the areas covered. He denies signs of ?infection. ?Electronic Signature(s) ?Signed: 04/27/2021 3:46:31 PM By: HKalman ShanDO ?Entered By: HKalman Shanon 04/27/2021 14:38:49 ?-------------------------------------------------------------------------------- ?Physical Exam Details ?Patient Name: Date of Service: ?[place] [name] 04/27/2021 1:15 PM ?Medical Record Number: [id number]?Patient Account Number: [id number]?Date of Birth/Sex: Treating RN: ?124-Mar-1947((76y.o. M) ?Primary Care Provider: MMinette BrineOther Clinician: ?Referring Provider: ?Treating Provider/Extender: HKalman Shan?MMinette Brine?Weeks in Treatment:  0 ?Constitutional ?respirations regular, non-labored and within target range for patient..Marland Kitchen?Cardiovascular ?2+ dorsalis pedis/posterior tibialis pulses. ?Psychiatric ?pleasant and cooperative. ?Notes ?Small scattered open wounds with granulati

## 2021-05-05 ENCOUNTER — Encounter (HOSPITAL_BASED_OUTPATIENT_CLINIC_OR_DEPARTMENT_OTHER): Payer: Medicare Other | Admitting: Internal Medicine

## 2021-05-05 DIAGNOSIS — I872 Venous insufficiency (chronic) (peripheral): Secondary | ICD-10-CM | POA: Diagnosis not present

## 2021-05-05 DIAGNOSIS — L97921 Non-pressure chronic ulcer of unspecified part of left lower leg limited to breakdown of skin: Secondary | ICD-10-CM | POA: Diagnosis not present

## 2021-05-05 DIAGNOSIS — L97312 Non-pressure chronic ulcer of right ankle with fat layer exposed: Secondary | ICD-10-CM | POA: Diagnosis not present

## 2021-05-05 DIAGNOSIS — L97811 Non-pressure chronic ulcer of other part of right lower leg limited to breakdown of skin: Secondary | ICD-10-CM | POA: Diagnosis not present

## 2021-05-05 DIAGNOSIS — I890 Lymphedema, not elsewhere classified: Secondary | ICD-10-CM | POA: Diagnosis not present

## 2021-05-05 DIAGNOSIS — I4819 Other persistent atrial fibrillation: Secondary | ICD-10-CM | POA: Diagnosis not present

## 2021-05-05 DIAGNOSIS — L97812 Non-pressure chronic ulcer of other part of right lower leg with fat layer exposed: Secondary | ICD-10-CM | POA: Diagnosis not present

## 2021-05-05 DIAGNOSIS — E11622 Type 2 diabetes mellitus with other skin ulcer: Secondary | ICD-10-CM | POA: Diagnosis not present

## 2021-05-10 ENCOUNTER — Ambulatory Visit (INDEPENDENT_AMBULATORY_CARE_PROVIDER_SITE_OTHER): Payer: Medicare Other | Admitting: Pharmacist

## 2021-05-10 DIAGNOSIS — H26492 Other secondary cataract, left eye: Secondary | ICD-10-CM | POA: Diagnosis not present

## 2021-05-10 DIAGNOSIS — I4819 Other persistent atrial fibrillation: Secondary | ICD-10-CM

## 2021-05-10 DIAGNOSIS — Z7901 Long term (current) use of anticoagulants: Secondary | ICD-10-CM | POA: Diagnosis not present

## 2021-05-10 LAB — POCT INR: INR: 2.6 (ref 2.0–3.0)

## 2021-05-10 NOTE — Patient Instructions (Signed)
Description   ?Continue taking 1.5 tablets daily except 1 tablet on Sunday.  Repeat INR in 8 weeks.  [phone] ?  ? ? ?

## 2021-05-12 ENCOUNTER — Encounter (HOSPITAL_BASED_OUTPATIENT_CLINIC_OR_DEPARTMENT_OTHER): Payer: Medicare Other | Attending: Internal Medicine | Admitting: Internal Medicine

## 2021-05-12 DIAGNOSIS — L97812 Non-pressure chronic ulcer of other part of right lower leg with fat layer exposed: Secondary | ICD-10-CM | POA: Diagnosis not present

## 2021-05-12 DIAGNOSIS — I872 Venous insufficiency (chronic) (peripheral): Secondary | ICD-10-CM | POA: Insufficient documentation

## 2021-05-12 DIAGNOSIS — Z86718 Personal history of other venous thrombosis and embolism: Secondary | ICD-10-CM | POA: Insufficient documentation

## 2021-05-12 DIAGNOSIS — E11622 Type 2 diabetes mellitus with other skin ulcer: Secondary | ICD-10-CM | POA: Insufficient documentation

## 2021-05-12 DIAGNOSIS — Z794 Long term (current) use of insulin: Secondary | ICD-10-CM | POA: Diagnosis not present

## 2021-05-12 DIAGNOSIS — Z7984 Long term (current) use of oral hypoglycemic drugs: Secondary | ICD-10-CM | POA: Diagnosis not present

## 2021-05-12 DIAGNOSIS — L97822 Non-pressure chronic ulcer of other part of left lower leg with fat layer exposed: Secondary | ICD-10-CM | POA: Diagnosis not present

## 2021-05-12 DIAGNOSIS — I4819 Other persistent atrial fibrillation: Secondary | ICD-10-CM | POA: Diagnosis not present

## 2021-05-12 DIAGNOSIS — E1151 Type 2 diabetes mellitus with diabetic peripheral angiopathy without gangrene: Secondary | ICD-10-CM | POA: Insufficient documentation

## 2021-05-12 DIAGNOSIS — I890 Lymphedema, not elsewhere classified: Secondary | ICD-10-CM | POA: Diagnosis not present

## 2021-05-12 DIAGNOSIS — Z7901 Long term (current) use of anticoagulants: Secondary | ICD-10-CM | POA: Diagnosis not present

## 2021-05-15 NOTE — Progress Notes (Signed)
[place], Estevan L. ([id number]) ?Visit Report for 05/12/2021 ?Chief Complaint Document Details ?Patient Name: Date of Service: ?[place], [name] 05/12/2021 10:15 A M ?Medical Record Number: [id number] ?Patient Account Number: [id number] ?Date of Birth/Sex: Treating RN: ?[birth year] (76 y.o. [name] ?Primary Care Provider: [name] Other Clinician: ?Referring Provider: ?Treating Provider/Extender: Kalman Shan ?[name] ?Weeks in Treatment: 2 ?Information Obtained from: Patient ?Chief Complaint ?Bilateral lower extremity wounds ?Electronic Signature(s) ?Signed: 05/12/2021 11:54:48 AM By: Kalman Shan DO ?Entered By: Kalman Shan on 05/12/2021 11:27:25 ?-------------------------------------------------------------------------------- ?HPI Details ?Patient Name: Date of Service: ?[place], [name] 05/12/2021 10:15 A M ?Medical Record Number: [id number] ?Patient Account Number: [id number] ?Date of Birth/Sex: Treating RN: ?27-Jul-1945 (76 y.o. [name] ?Primary Care Provider: [name] Other Clinician: ?Referring Provider: ?Treating Provider/Extender: Kalman Shan ?[name] ?Weeks in Treatment: 2 ?History of Present Illness ?HPI Description: Admission 11/01/2020 ?Mr. Joh Rao is a 75 year old male with a past medical history of controlled type 2 diabetes on oral medications, chronic venous insufficiency and ?persistent A. fib on Coumadin that presents to the clinic for A 1 year history of waxing and waning of bilateral lower extremity wounds. He states that his ?wounds opened up spontaneously. They have improved over the past 6 months. He was doing [employer] in August and September which helped heal his ?wounds. They have opened up again and he is using lotion and compression stockings. He currently denies signs of infection. ?11/3; patient presents for follow-up. He has no issues or complaints today. He has been using his juxta light compression daily with  Hydrofera Blue underneath. ?He denies signs of infection. ?11/8; patient presents for follow-up. He states that on removal of the Hydrofera Blue dressing the skin tore due to being tightly adhered to the wound bed. He ?has been using his juxta light compressions daily. He denies signs of infection. ?Readmission 04/27/2021 ?Patient presents with a 15-monthhistory of opening scattered wounds to his legs bilaterally. He was previously seen in our clinic for this issue last year. His last ?encounter was on 11/8 and he still had wounds at that time. He never followed up afterwards. He states that at one point all his wounds had healed up. He ?reports using his juxta lite compressions daily but thinks that the elasticity has worn out. He is currently been keeping the areas covered. He denies signs of ?infection. ?4/28; patient readmitted the clinic last week. He has wounds bilaterally I think secondary to chronic venous insufficiency with lymphedema. We saw him last ?fall discharged him with juxta lites. Unfortunately he has heard nothing about the juxta lites. He took the kerlix Coban wrap off today and showered before he ?came in. Otherwise everything seems okay. ?5/5; patient presents for follow-up. He has 1 remaining wound to the right lower extremity located to the proximal aspect. He has his juxta lite compressions ?today. He declines in office compression wrap today. ?Electronic Signature(s) ?Signed: 05/12/2021 11:54:48 AM By: HKalman ShanDO ?Entered By: HKalman Shanon 05/12/2021 11:28:30 ?-------------------------------------------------------------------------------- ?Physical Exam Details ?Patient Name: Date of Service: ?[place] [name] 05/12/2021 10:15 A M ?Medical Record Number: [id number]?Patient Account Number: [id number]?Date of Birth/Sex: Treating RN: ?123-Jul-1947((76y.o. MMarcheta Grammes?Primary Care Provider: MMinette BrineOther Clinician: ?Referring Provider: ?Treating Provider/Extender:  HKalman Shan?MMinette Brine?Weeks in Treatment: 2 ?Constitutional ?respirations regular, non-labored and within target range for patient..Marland Kitchen?Cardiovascular ?2+ dorsalis pedis/posterior tibialis pulses. ?Psychiatric ?pleasant and cooperative. ?Notes ?  Right lower extremity: T the upper lateral thigh there is a small open wound with granulation tissue present. ?o ?Electronic Signature(s) ?Signed: 05/12/2021 11:54:48 AM By: Kalman Shan DO ?Entered By: Kalman Shan on 05/12/2021 11:29:13 ?-------------------------------------------------------------------------------- ?Physician Orders Details ?Patient Name: Date of Service: ?[place], [name] 05/12/2021 10:15 A M ?Medical Record Number: [id number] ?Patient Account Number: [id number] ?Date of Birth/Sex: Treating RN: ?12-14-1945 (76 y.o. Janyth Contes ?Primary Care Provider: [name] Other Clinician: ?Referring Provider: ?Treating Provider/Extender: Kalman Shan ?[name] ?Weeks in Treatment: 2 ?Verbal / Phone Orders: No ?Diagnosis Coding ?ICD-10 Coding ?Code Description ?A07.622 Non-pressure chronic ulcer of other part of right lower leg with fat layer exposed ?Q33.354 Non-pressure chronic ulcer of other part of left lower leg with fat layer exposed ?E11.622 Type 2 diabetes mellitus with other skin ulcer ?I87.2 Venous insufficiency (chronic) (peripheral) ?I89.0 Lymphedema, not elsewhere classified ?I48.19 Other persistent atrial fibrillation ?Z79.01 Long term (current) use of anticoagulants ?Follow-up Appointments ?ppointment in 1 week. - with Dr. Heber [place] [facility] [place], Room 7) - Friday 5/12 at 11:00 ?Return A ?Bathing/ Shower/ Hygiene ?May shower with protection but do not get wound dressing(s) wet. - Can use cast protector bag from [facility], [facility] or [place] ?Edema Control - Lymphedema / SCD / Other ?Elevate legs to the level of the heart or above for 30 minutes daily and/or when sitting, a frequency of: - throughout the day ?Avoid standing for long  periods of time. ?Compression stocking or Garment 20-30 mm/Hg pressure to: - Juxtalite to both legs daily ?Wound Treatment ?Wound #7 - Lower Leg Wound Laterality: Right, Lateral, Proximal ?Cleanser: Soap and Water 1 x Per Day/30 Days ?Discharge Instructions: May shower and wash wound with dial antibacterial soap and water prior to dressing change. ?Cleanser: Wound Cleanser 1 x Per Day/30 Days ?Discharge Instructions: Cleanse the wound with wound cleanser prior to applying a clean dressing using gauze sponges, not tissue or cotton balls. ?Peri-Wound Care: Sween Lotion (Moisturizing lotion) 1 x Per Day/30 Days ?Discharge Instructions: Apply moisturizing lotion as directed ?Topical: Triple Antibiotic Ointment, 1 (oz) Tube 1 x Per Day/30 Days ?Discharge Instructions: Can use Bacitracin at home ?Secondary Dressing: ComfortFoam Border, 3x3 in (silicone border) (DME) (Generic) 1 x Per Day/30 Days ?Discharge Instructions: Apply over primary dressing as directed. ?Electronic Signature(s) ?Signed: 05/12/2021 1:22:09 PM By: Levan Hurst RN, BSN ?Signed: 05/15/2021 11:06:06 AM By: Kalman Shan DO ?Previous Signature: 05/12/2021 11:54:48 AM Version By: Kalman Shan DO ?Entered By: Levan Hurst on 05/12/2021 12:22:48 ?-------------------------------------------------------------------------------- ?Problem List Details ?Patient Name: ?Date of Service: ?[place], [name] 05/12/2021 10:15 A M ?Medical Record Number: [id number] ?Patient Account Number: [id number] ?Date of Birth/Sex: ?Treating RN: ?[birth year] (77 y.o. Janyth Contes ?Primary Care Provider: [name] ?Other Clinician: ?Referring Provider: ?Treating Provider/Extender: Kalman Shan ?[name] ?Weeks in Treatment: 2 ?Active Problems ?ICD-10 ?Encounter ?Code Description Active Date MDM ?Diagnosis ?T34.287 Non-pressure chronic ulcer of other part of right lower leg with fat layer 04/27/2021 No Yes ?exposed ?G81.157 Non-pressure chronic ulcer of other  part of left lower leg with fat layer exposed4/20/2023 No Yes ?E11.622 Type 2 diabetes mellitus with other skin ulcer 04/27/2021 No Yes ?I87.2 Venous insufficiency (chronic) (peripheral) 04/27/2021 No Yes ?W62

## 2021-05-15 NOTE — Progress Notes (Signed)
[name], Brett L. ([id number]) ?Visit Report for 05/12/2021 ?Arrival Information Details ?Patient Name: Date of Service: ?[place], [name] 05/12/2021 10:15 A M ?Medical Record Number: [id number] ?Patient Account Number: [id number] ?Date of Birth/Sex: Treating RN: ?09-Nov-1945 (76 y.o. Brett [name] ?Primary Care Sadeen Wiegel: [name] Other Clinician: ?Referring Garyson Stelly: ?Treating Loie Jahr/Extender: Kalman Shan ?[name] ?Weeks in Treatment: 2 ?Visit Information History Since Last Visit ?Added or deleted any medications: No ?Patient Arrived: Brett [name] ?Any new allergies or adverse reactions: No ?Arrival Time: 10:41 ?Had a fall or experienced change in No ?Accompanied By: alone ?activities of daily living that may affect ?Transfer Assistance: None ?risk of falls: ?Patient Identification Verified: Yes ?Signs or symptoms of abuse/neglect since last visito No ?Secondary Verification Process Completed: Yes ?Hospitalized since last visit: No ?Patient Requires Transmission-Based Precautions: No ?Implantable device outside of the clinic excluding No ?Patient Has Alerts: Yes ?cellular tissue based products placed in the center ?Patient Alerts: Patient on Blood Thinner since last visit: ?11/18/20 ABI's=  Has Dressing in Place as Prescribed: No ?11/18/20 TBI R=.79 L=.82 ?Has Compression in Place as Prescribed: No ?Pain Present Now: No ?Electronic Signature(s) ?Signed: 05/12/2021 1:22:09 PM By: Levan Hurst RN, BSN ?Entered By: Levan Hurst on 05/12/2021 10:41:27 ?-------------------------------------------------------------------------------- ?Clinic Level of Care Assessment Details ?Patient Name: Date of Service: ?[place], [name] 05/12/2021 10:15 A M ?Medical Record Number: [id number] ?Patient Account Number: [id number] ?Date of Birth/Sex: Treating RN: ?[birth year] (76 y.o. Brett [name] ?Primary Care Rosie Torrez: [name] Other Clinician: ?Referring Greydis Stlouis: ?Treating Pedro Oldenburg/Extender: Kalman Shan ?[name] ?Weeks in Treatment: 2 ?Clinic Level of Care Assessment Items ?TOOL 4 Quantity Score ?X- 1 0 ?Use when only an EandM is performed on FOLLOW-UP visit ?ASSESSMENTS - Nursing Assessment / Reassessment ?X- 1 10 ?Reassessment of Co-morbidities (includes updates in patient status) ?X- 1 5 ?Reassessment of Adherence to Treatment Plan ?ASSESSMENTS - Wound and Skin A ssessment / Reassessment ?'[]'$  - 0 ?Simple Wound Assessment / Reassessment - one wound ?X- 4 5 ?Complex Wound Assessment / Reassessment - multiple wounds ?'[]'$  - 0 ?Dermatologic / Skin Assessment (not related to wound area) ?ASSESSMENTS - Focused Assessment ?'[]'$  - 0 ?Circumferential Edema Measurements - multi extremities ?'[]'$  - 0 ?Nutritional Assessment / Counseling / Intervention ?X- 1 5 ?Lower Extremity Assessment (monofilament, tuning fork, pulses) ?'[]'$  - 0 ?Peripheral Arterial Disease Assessment (using hand held doppler) ?ASSESSMENTS - Ostomy and/or Continence Assessment and Care ?'[]'$  - 0 ?Incontinence Assessment and Management ?'[]'$  - 0 ?Ostomy Care Assessment and Management (repouching, etc.) ?PROCESS - Coordination of Care ?X - Simple Patient / Family Education for ongoing care 1 15 ?'[]'$  - 0 ?Complex (extensive) Patient / Family Education for ongoing care ?X- 1 10 ?Staff obtains Consents, Records, T Results / Process Orders ?est ?'[]'$  - 0 ?Staff telephones HHA, Nursing Homes / Clarify orders / etc ?'[]'$  - 0 ?Routine Transfer to another Facility (non-emergent condition) ?'[]'$  - 0 ?Routine Hospital Admission (non-emergent condition) ?'[]'$  - 0 ?New Admissions / Biomedical engineer / Ordering NPWT Apligraf, etc. ?, ?'[]'$  - 0 ?Emergency Hospital Admission (emergent condition) ?X- 1 10 ?Simple Discharge Coordination ?'[]'$  - 0 ?Complex (extensive) Discharge Coordination ?PROCESS - Special Needs ?'[]'$  - 0 ?Pediatric / Minor Patient Management ?'[]'$  - 0 ?Isolation Patient Management ?'[]'$  - 0 ?Hearing / Language / Visual special needs ?'[]'$  - 0 ?Assessment of  Community assistance (transportation, D/C planning, etc.) ?'[]'$  - 0 ?Additional assistance / Altered mentation ?'[]'$  - 0 ?Support Surface(s) Assessment (bed, cushion, seat, etc.) ?  INTERVENTIONS - Wound Cleansing / Measurement ?'[]'$  - 0 ?Simple Wound Cleansing - one wound ?X- 4 5 ?Complex Wound Cleansing - multiple wounds ?X- 1 5 ?Wound Imaging (photographs - any number of wounds) ?'[]'$  - 0 ?Wound Tracing (instead of photographs) ?'[]'$  - 0 ?Simple Wound Measurement - one wound ?X- 4 5 ?Complex Wound Measurement - multiple wounds ?INTERVENTIONS - Wound Dressings ?X - Small Wound Dressing one or multiple wounds 1 10 ?'[]'$  - 0 ?Medium Wound Dressing one or multiple wounds ?'[]'$  - 0 ?Large Wound Dressing one or multiple wounds ?'[]'$  - 0 ?Application of Medications - topical ?'[]'$  - 0 ?Application of Medications - injection ?INTERVENTIONS - Miscellaneous ?'[]'$  - 0 ?External ear exam ?'[]'$  - 0 ?Specimen Collection (cultures, biopsies, blood, body fluids, etc.) ?'[]'$  - 0 ?Specimen(s) / Culture(s) sent or taken to Lab for analysis ?'[]'$  - 0 ?Patient Transfer (multiple staff / Civil Service fast streamer / Similar devices) ?'[]'$  - 0 ?Simple Staple / Suture removal (25 or less) ?'[]'$  - 0 ?Complex Staple / Suture removal (26 or more) ?'[]'$  - 0 ?Hypo / Hyperglycemic Management (close monitor of Blood Glucose) ?'[]'$  - 0 ?Ankle / Brachial Index (ABI) - do not check if billed separately ?X- 1 5 ?Vital Signs ?Has the patient been seen at the hospital within the last three years: Yes ?Total Score: 135 ?Level Of Care: New/Established - Level 4 ?Electronic Signature(s) ?Signed: 05/12/2021 1:22:09 PM By: Levan Hurst RN, BSN ?Entered By: Levan Hurst on 05/12/2021 12:18:39 ?-------------------------------------------------------------------------------- ?Encounter Discharge Information Details ?Patient Name: Date of Service: ?[place], [name] 05/12/2021 10:15 A M ?Medical Record Number: [id number] ?Patient Account Number: [id number] ?Date of Birth/Sex: Treating RN: ?[birth year] (76  y.o. Brett [name] ?Primary Care Chiara Coltrin: [name] Other Clinician: ?Referring Donaven Criswell: ?Treating Lizvette Lightsey/Extender: Kalman Shan ?[name] ?Weeks in Treatment: 2 ?Encounter Discharge Information Items ?Discharge Condition: Stable ?Ambulatory Status: Brett [name] ?Discharge Destination: Home ?Transportation: Private Auto ?Accompanied By: alone ?Schedule Follow-up Appointment: Yes ?Clinical Summary of Care: Patient Declined ?Electronic Signature(s) ?Signed: 05/12/2021 1:22:09 PM By: Levan Hurst RN, BSN ?Entered By: Levan Hurst on 05/12/2021 12:20:48 ?-------------------------------------------------------------------------------- ?Lower Extremity Assessment Details ?Patient Name: Date of Service: ?[place], [name] 05/12/2021 10:15 A M ?Medical Record Number: [id number] ?Patient Account Number: [id number] ?Date of Birth/Sex: Treating RN: ?[birth year] (76 y.o. Brett [name] ?Primary Care Billye Pickerel: [name] Other Clinician: ?Referring Bridney Guadarrama: ?Treating Joury Allcorn/Extender: Kalman Shan ?[name] ?Weeks in Treatment: 2 ?Edema Assessment ?Assessed: [Left: No] [Right: No] ?Edema: [Left: Yes] [Right: Yes] ?Calf ?Left: Right: ?Point of Measurement: 34 cm From Medial Instep 43.6 cm 40.5 cm ?Ankle ?Left: Right: ?Point of Measurement: 10 cm From Medial Instep 25.8 cm 25 cm ?Vascular Assessment ?Pulses: ?Dorsalis Pedis ?Palpable: [Left:Yes] [Right:Yes] ?Electronic Signature(s) ?Signed: 05/12/2021 1:22:09 PM By: Levan Hurst RN, BSN ?Entered By: Levan Hurst on 05/12/2021 10:45:45 ?-------------------------------------------------------------------------------- ?Multi Wound Chart Details ?Patient Name: ?Date of Service: ?[place], [name] 05/12/2021 10:15 A M ?Medical Record Number: [id number] ?Patient Account Number: [id number] ?Date of Birth/Sex: ?Treating RN: ?October 23, 1945 (76 y.o. [name] ?Primary Care Mimi Debellis: [name] ?Other Clinician: ?Referring Quitman Norberto: ?Treating  Austin Herd/Extender: Kalman Shan ?[name] ?Weeks in Treatment: 2 ?Vital Signs ?Height(in): 71 ?Pulse(bpm): 70 ?Weight(lbs): 302 ?Blood Pressure(mmHg): 138/87 ?Body Mass Index(BMI): 42.1 ?Temperature(??F): 97.9

## 2021-05-16 NOTE — Progress Notes (Signed)
[name], Brett L. ([id number]) ?Visit Report for 05/05/2021 ?HPI Details ?Patient Name: Date of Service: ?[place], [name] 05/05/2021 1:15 PM ?Medical Record Number: [id number] ?Patient Account Number: [id number] ?Date of Birth/Sex: Treating RN: ?Jan 14, 1945 (76 y.o. Brett [name] ?Primary Care Provider: Minette [name] Other Clinician: ?Referring Provider: ?Treating Provider/Extender: Brett [name] ?Brett [name] ?Weeks in Treatment: 1 ?History of Present Illness ?HPI Description: Admission 11/01/2020 ?[name] is a 76 year old male with a past medical history of controlled type 2 diabetes on oral medications, chronic venous insufficiency and ?persistent A. fib on Coumadin that presents to the clinic for A 1 year history of waxing and waning of bilateral lower extremity wounds. He states that his ?wounds opened up spontaneously. They have improved over the past 6 months. He was doing [employer] in August and September which helped heal his ?wounds. They have opened up again and he is using lotion and compression stockings. He currently denies signs of infection. ?11/3; patient presents for follow-up. He has no issues or complaints today. He has been using his juxta light compression daily with Hydrofera Blue underneath. ?He denies signs of infection. ?11/8; patient presents for follow-up. He states that on removal of the Hydrofera Blue dressing the skin tore due to being tightly adhered to the wound bed. He ?has been using his juxta light compressions daily. He denies signs of infection. ?Readmission 04/27/2021 ?Patient presents with a 15-monthhistory of opening scattered wounds to his legs bilaterally. He was previously seen in our clinic for this issue last year. His last ?encounter was on 11/8 and he still had wounds at that time. He never followed up afterwards. He states that at one point all his wounds had healed up. He ?reports using his juxta lite compressions daily but thinks that the  elasticity has worn out. He is currently been keeping the areas covered. He denies signs of ?infection. ?4/28; patient readmitted the clinic last week. He has wounds bilaterally I think secondary to chronic venous insufficiency with lymphedema. We saw him last ?fall discharged him with juxta lites. Unfortunately he has heard nothing about the juxta lites. He took the kerlix Coban wrap off today and showered before he ?came in. Otherwise everything seems okay. ?Electronic Signature(s) ?Signed: 05/05/2021 4:01:11 PM By: Brett [name] ?Entered By: Brett Hamon 05/05/2021 14:27:59 ?-------------------------------------------------------------------------------- ?Physical Exam Details ?Patient Name: Date of Service: ?[name] 05/05/2021 1:15 PM ?Medical Record Number: [id number]?Patient Account Number: [id number]?Date of Birth/Sex: Treating RN: ?122-Dec-1947((76y.o. MMarcheta [name]?Primary Care Provider: MMinette BrineOther Clinician: ?Referring Provider: ?Treating Provider/Extender: Brett [name]?MMinette [name]?Weeks in Treatment: 1 ?Constitutional ?Patient is hypertensive.. Pulse regular and within target range for patient..Marland KitchenRespirations regular, non-labored and within target range.. Temperature is normal and ?within the target range for the patient..Marland KitchenAppears in no distress. ?Cardiovascular ?Pedal pulses are palpable bilat. Edema present in both extremities.Mostly nonpitting. ?Notes ?Wound exam; he has small open areas on his bilateral medial lower ankles right upper lateral leg none of this looks particularly ominous with healthy granulation ?tissue. Palpable both dorsalis pedis and posterior tibial bilaterally ?Electronic Signature(s) ?Signed: 05/05/2021 4:01:11 PM By: Brett [name] ?Entered By: Brett Hamon 05/05/2021 14:29:26 ?-------------------------------------------------------------------------------- ?Physician Orders Details ?Patient Name: ?Date of Service: ?[place]  [name] 05/05/2021 1:15 PM ?Medical Record Number: [id number]?Patient Account Number: [id number]?Date of Birth/Sex: ?Treating RN: ?1November 20, 1947((76y.o. MMare [name]?Primary Care Provider: MMinette [name]?Other  Clinician: ?Referring Provider: ?Treating Provider/Extender: Brett [name] ?Brett [name] ?Weeks in Treatment: 1 ?Verbal / Phone Orders: No ?Diagnosis Coding ?Follow-up Appointments ?ppointment in 1 week. - with Dr. Heber [place] Brett [name], Room 7) ?Return A ?Bathing/ Shower/ Hygiene ?May shower with protection but do not get wound dressing(s) wet. - Can use cast protector bag from [facility], [facility] or [place] ?Edema Control - Lymphedema / SCD / Other ?Elevate legs to the level of the heart or above for 30 minutes daily and/or when sitting, a frequency of: - throughout the day ?Avoid standing for long periods of time. ?Wound Treatment ?Wound #4 - Ankle Wound Laterality: Left, Medial ?Cleanser: Soap and Water 3 x Per Day/30 Days ?Discharge Instructions: May shower and wash wound with dial antibacterial soap and water prior to dressing change. ?Cleanser: Wound Cleanser 3 x Per Day/30 Days ?Discharge Instructions: Cleanse the wound with wound cleanser prior to applying a clean dressing using gauze sponges, not tissue or cotton balls. ?Peri-Wound Care: Triamcinolone 15 (g) 3 x Per Day/30 Days ?Discharge Instructions: Use triamcinolone 15 (g) as directed ?Peri-Wound Care: Sween Lotion (Moisturizing lotion) 3 x Per Day/30 Days ?Discharge Instructions: Apply moisturizing lotion as directed ?Topical: Gentamicin 3 x Per Day/30 Days ?Discharge Instructions: As directed by physician ?Prim Dressing: KerraCel Ag Gelling Fiber Dressing, 2x2 in (silver alginate) 3 x Per Day/30 Days ?ary ?Discharge Instructions: Apply silver alginate to wound bed as instructed ?Secondary Dressing: Woven Gauze Sponge, Non-Sterile 4x4 in 3 x Per Day/30 Days ?Discharge Instructions: Apply over primary dressing as directed. ?Compression Wrap:  ThreePress (3 layer compression wrap) 3 x Per Day/30 Days ?Discharge Instructions: Apply three layer compression as directed. ?Compression Stockings: Circaid Juxta Lite Compression Wrap (DME) ?Left Leg Compression Amount: 30-40 mmHG ?Discharge Instructions: Apply Circaid Juxta Lite Compression Wrap daily as instructed. Apply first thing in the morning, remove at ?night before bed. ?Wound #5 - Lower Leg Wound Laterality: Left, Anterior ?Cleanser: Soap and Water 3 x Per Day/30 Days ?Discharge Instructions: May shower and wash wound with dial antibacterial soap and water prior to dressing change. ?Cleanser: Wound Cleanser 3 x Per Day/30 Days ?Discharge Instructions: Cleanse the wound with wound cleanser prior to applying a clean dressing using gauze sponges, not tissue or cotton balls. ?Peri-Wound Care: Triamcinolone 15 (g) 3 x Per Day/30 Days ?Discharge Instructions: Use triamcinolone 15 (g) as directed ?Peri-Wound Care: Sween Lotion (Moisturizing lotion) 3 x Per Day/30 Days ?Discharge Instructions: Apply moisturizing lotion as directed ?Topical: Gentamicin 3 x Per Day/30 Days ?Discharge Instructions: As directed by physician ?Prim Dressing: KerraCel Ag Gelling Fiber Dressing, 2x2 in (silver alginate) 3 x Per Day/30 Days ?ary ?Discharge Instructions: Apply silver alginate to wound bed as instructed ?Secondary Dressing: Woven Gauze Sponge, Non-Sterile 4x4 in 3 x Per Day/30 Days ?Discharge Instructions: Apply over primary dressing as directed. ?Compression Wrap: ThreePress (3 layer compression wrap) 3 x Per Day/30 Days ?Discharge Instructions: Apply three layer compression as directed. ?Wound #6 - Lower Leg Wound Laterality: Right, Lateral, Distal ?Cleanser: Soap and Water 3 x Per Day/30 Days ?Discharge Instructions: May shower and wash wound with dial antibacterial soap and water prior to dressing change. ?Cleanser: Wound Cleanser 3 x Per Day/30 Days ?Discharge Instructions: Cleanse the wound with wound cleanser prior  to applying a clean dressing using gauze sponges, not tissue or cotton balls. ?Peri-Wound Care: Triamcinolone 15 (g) 3 x Per Day/30 Days ?Discharge Instructions: Use triamcinolone 15 (g) as directed ?Peri-Wound Car

## 2021-05-16 NOTE — Progress Notes (Signed)
Macioce, Farris L. ([id number]) ?Visit Report for 05/05/2021 ?Arrival Information Details ?Patient Name: Date of Service: ?[place], [name] 05/05/2021 1:15 PM ?Medical Record Number: [id number] ?Patient Account Number: [id number] ?Date of Birth/Sex: Treating RN: ?[birth year] (76 y.o. [name] ?Primary Care Lawren Sexson: [name] Other Clinician: ?Referring Amberrose Friebel: ?Treating Shakeerah Gradel/Extender: Linton Ham ?[name] ?Weeks in Treatment: 1 ?Visit Information History Since Last Visit ?Added or deleted any medications: No ?Patient Arrived: Ambulatory ?Any new allergies or adverse reactions: No ?Arrival Time: 13:28 ?Had a fall or experienced change in No ?Accompanied By: self ?activities of daily living that may affect ?Transfer Assistance: None ?risk of falls: ?Patient Identification Verified: Yes ?Signs or symptoms of abuse/neglect since last visito No ?Secondary Verification Process Completed: Yes ?Hospitalized since last visit: No ?Patient Requires Transmission-Based Precautions: No ?Implantable device outside of the clinic excluding No ?Patient Has Alerts: Yes ?cellular tissue based products placed in the center ?Patient Alerts: Patient on Blood Thinner since last visit: ?11/18/20 ABI's=  Has Dressing in Place as Prescribed: Yes ?11/18/20 TBI R=.79 L=.82 ?Pain Present Now: No ?Electronic Signature(s) ?Signed: 05/08/2021 10:04:42 AM By: Sandre Kitty ?Entered By: Sandre Kitty on 05/05/2021 13:28:50 ?-------------------------------------------------------------------------------- ?Compression Therapy Details ?Patient Name: Date of Service: ?[place], [name] 05/05/2021 1:15 PM ?Medical Record Number: [id number] ?Patient Account Number: [id number] ?Date of Birth/Sex: Treating RN: ?01-18-1945 (76 y.o. Mare Ferrari ?Primary Care Arlina Sabina: [name] Other Clinician: ?Referring Kimaria Struthers: ?Treating Koriana Stepien/Extender: Linton Ham ?[name] ?Weeks in Treatment: 1 ?Compression  Therapy Performed for Wound Assessment: Wound #4 Left,Medial Ankle ?Performed By: Clinician Sharyn Creamer, RN ?Compression Type: Three Layer ?Post Procedure Diagnosis ?Same as Pre-procedure ?Electronic Signature(s) ?Signed: 05/16/2021 8:25:23 AM By: Sharyn Creamer RN, BSN ?Entered By: Sharyn Creamer on 05/05/2021 14:29:08 ?-------------------------------------------------------------------------------- ?Compression Therapy Details ?Patient Name: ?Date of Service: ?[place], [name] 05/05/2021 1:15 PM ?Medical Record Number: [id number] ?Patient Account Number: [id number] ?Date of Birth/Sex: ?Treating RN: ?1945/09/02 (76 y.o. Mare Ferrari ?Primary Care Kenyatte Chatmon: [name] ?Other Clinician: ?Referring Jeriann Sayres: ?Treating Mclane Arora/Extender: Linton Ham ?[name] ?Weeks in Treatment: 1 ?Compression Therapy Performed for Wound Assessment: Wound #7 Right,Proximal,Lateral Lower Leg ?Performed By: Clinician Sharyn Creamer, RN ?Compression Type: Three Layer ?Post Procedure Diagnosis ?Same as Pre-procedure ?Electronic Signature(s) ?Signed: 05/16/2021 8:25:23 AM By: Sharyn Creamer RN, BSN ?Entered By: Sharyn Creamer on 05/05/2021 14:29:26 ?-------------------------------------------------------------------------------- ?Encounter Discharge Information Details ?Patient Name: ?Date of Service: ?[place], [name] 05/05/2021 1:15 PM ?Medical Record Number: [id number] ?Patient Account Number: [id number] ?Date of Birth/Sex: ?Treating RN: ?[birth year] (76 y.o. Mare Ferrari ?Primary Care Shericka Johnstone: [name] ?Other Clinician: ?Referring Jenavi Beedle: ?Treating Iyad Deroo/Extender: Linton Ham ?[name] ?Weeks in Treatment: 1 ?Encounter Discharge Information Items ?Discharge Condition: Stable ?Ambulatory Status: Gilford Rile ?Discharge Destination: Home ?Transportation: Private Auto ?Accompanied By: self ?Schedule Follow-up Appointment: Yes ?Clinical Summary of Care: Patient Declined ?Electronic  Signature(s) ?Signed: 05/16/2021 8:25:23 AM By: Sharyn Creamer RN, BSN ?Entered By: Sharyn Creamer on 05/05/2021 15:32:58 ?-------------------------------------------------------------------------------- ?Lower Extremity Assessment Details ?Patient Name: ?Date of Service: ?[place], [name] 05/05/2021 1:15 PM ?Medical Record Number: [id number] ?Patient Account Number: [id number] ?Date of Birth/Sex: ?Treating RN: ?04-16-1945 (76 y.o. Mare Ferrari ?Primary Care Tasmin Exantus: [name] ?Other Clinician: ?Referring Zelma Mazariego: ?Treating Lenette Rau/Extender: Linton Ham ?[name] ?Weeks in Treatment: 1 ?Edema Assessment ?Assessed: [Left: No] [Right: No] ?Edema: [Left: Yes] [Right: Yes] ?Calf ?Left: Right: ?Point of Measurement: 34 cm From Medial Instep 47.3 cm 41 cm ?Ankle ?Left: Right: ?Point of Measurement:  10 cm From Medial Instep 26.4 cm 25.4 cm ?Knee To Floor ?Left: Right: ?From Medial Instep 44 cm 44 cm ?Vascular Assessment ?Pulses: ?Dorsalis Pedis ?Palpable: [Left:Yes] [Right:Yes] ?Electronic Signature(s) ?Signed: 05/16/2021 8:25:23 AM By: Sharyn Creamer RN, BSN ?Entered By: Sharyn Creamer on 05/05/2021 15:36:31 ?-------------------------------------------------------------------------------- ?Multi Wound Chart Details ?Patient Name: ?Date of Service: ?[place], [name] 05/05/2021 1:15 PM ?Medical Record Number: [id number] ?Patient Account Number: [id number] ?Date of Birth/Sex: ?Treating RN: ?26-May-1945 (76 y.o. [name] ?Primary Care Shamel Galyean: [name] ?Other Clinician: ?Referring Breydan Shillingburg: ?Treating Tarnisha Kachmar/Extender: Linton Ham ?[name] ?Weeks in Treatment: 1 ?Vital Signs ?Height(in): 71 ?Pulse(bpm): 61 ?Weight(lbs): 302 ?Blood Pressure(mmHg): 154/80 ?Body Mass Index(BMI): 42.1 ?Temperature(??F): 98.5 ?Respiratory Rate(breaths/min): 20 ?Photos: ?Left, Medial Ankle Left, Anterior Lower Leg Right, Distal, Lateral Lower Leg ?Wound Location: ?Gradually Appeared Gradually Appeared  Gradually Appeared ?Wounding Event: ?Venous Leg Ulcer Venous Leg Ulcer Venous Leg Ulcer ?Primary Etiology: ?Sleep Apnea, Arrhythmia, Deep Vein Sleep Apnea, Arrhythmia, Deep Vein Sleep Apnea, Arrhythmia, Deep Vein ?Comorbid History: ?Thrombosis, Hypertension, Type II Thrombosis, Hypertension, Type II Thrombosis, Hypertension, Type II ?Diabetes Diabetes Diabetes ?03/06/2021 03/06/2021 03/06/2021 ?Date Acquired: ?'1 1 1 '$ ?Weeks of Treatment: ?Open Open Open ?Wound Status: ?No No No ?Wound Recurrence: ?0.6x0.3x0.1 1x0.4x0.1 0.3x0.3x0.1 ?Measurements L x W x D (cm) ?0.141 0.314 0.071 ?A (cm?) : ?rea ?0.014 0.031 0.007 ?Volume (cm?) : ?48.70% 69.20% 81.20% ?% Reduction in Area: ?74.50% 69.60% 81.60% ?% Reduction in Volume: ?Full Thickness Without Exposed Full Thickness Without Exposed Full Thickness Without Exposed ?Classification: ?Support Structures Support Structures Support Structures ?Medium Medium Medium ?Exudate Amount: ?Serosanguineous Serosanguineous Serosanguineous ?Exudate Type: ?red, brown red, brown red, brown ?Exudate Color: ?Distinct, outline attached Distinct, outline attached Distinct, outline attached ?Wound Margin: ?Large (67-100%) Large (67-100%) Large (67-100%) ?Granulation Amount: ?Red Red Red ?Granulation Quality: ?Small (1-33%) None Present (0%) None Present (0%) ?Necrotic Amount: ?Fat Layer (Subcutaneous Tissue): Yes Fat Layer (Subcutaneous Tissue): Yes Fat Layer (Subcutaneous Tissue): Yes ?Exposed Structures: ?Fascia: No ?Fascia: No ?Fascia: No ?Tendon: No ?Tendon: No ?Tendon: No ?Muscle: No ?Muscle: No ?Muscle: No ?Joint: No ?Joint: No ?Joint: No ?Bone: No ?Bone: No ?Bone: No ?None None None ?Epithelialization: ?Wound Number: 7 N/A N/A ?Photos: N/A N/A ?Right, Proximal, Lateral Lower Leg N/A N/A ?Wound Location: ?Gradually Appeared N/A N/A ?Wounding Event: ?Venous Leg Ulcer N/A N/A ?Primary Etiology: ?Sleep Apnea, Arrhythmia, Deep Vein N/A N/A ?Comorbid History: ?Thrombosis, Hypertension, Type  II ?Diabetes ?03/06/2021 N/A N/A ?Date Acquired: ?1 N/A N/A ?Weeks of Treatment: ?Open N/A N/A ?Wound Status: ?No N/A N/A ?Wound Recurrence: ?1.2x2.3x0.1 N/A N/A ?Measurements L x W x D (cm) ?2.168 N/A N/A ?A (cm?) : ?rea ?0.21

## 2021-05-19 ENCOUNTER — Encounter (HOSPITAL_BASED_OUTPATIENT_CLINIC_OR_DEPARTMENT_OTHER): Payer: Medicare Other | Admitting: Internal Medicine

## 2021-05-19 DIAGNOSIS — L97812 Non-pressure chronic ulcer of other part of right lower leg with fat layer exposed: Secondary | ICD-10-CM | POA: Diagnosis not present

## 2021-05-19 DIAGNOSIS — L97822 Non-pressure chronic ulcer of other part of left lower leg with fat layer exposed: Secondary | ICD-10-CM | POA: Diagnosis not present

## 2021-05-19 DIAGNOSIS — I872 Venous insufficiency (chronic) (peripheral): Secondary | ICD-10-CM

## 2021-05-19 DIAGNOSIS — E11622 Type 2 diabetes mellitus with other skin ulcer: Secondary | ICD-10-CM | POA: Diagnosis not present

## 2021-05-19 DIAGNOSIS — I4819 Other persistent atrial fibrillation: Secondary | ICD-10-CM | POA: Diagnosis not present

## 2021-05-19 DIAGNOSIS — I890 Lymphedema, not elsewhere classified: Secondary | ICD-10-CM

## 2021-05-22 NOTE — Progress Notes (Addendum)
[place], Regie L. ([id number]) ?Visit Report for 05/19/2021 ?Arrival Information Details ?Patient Name: Date of Service: ?[name] 05/19/2021 11:00 A M ?Medical Record Number: [id number] ?Patient Account Number: [id number] ?Date of Birth/Sex: Treating RN: ?09-15-1945 (76 y.o. [name] ?Primary Care Shantell Belongia: [name] Other Clinician: ?Referring Geraldin Habermehl: ?Treating Vaani Morren/Extender: Kalman Shan ?[name] ?Weeks in Treatment: 3 ?Visit Information History Since Last Visit ?Added or deleted any medications: No ?Patient Arrived: Kasandra Knudsen ?Any new allergies or adverse reactions: No ?Arrival Time: 11:25 ?Had a fall or experienced change in No ?Transfer Assistance: None ?activities of daily living that may affect ?Patient Requires Transmission-Based Precautions: No ?risk of falls: ?Patient Has Alerts: Yes ?Signs or symptoms of abuse/neglect since last visito No ?Patient Alerts: Patient on Blood Thinner ?Hospitalized since last visit: No ?11/18/20 ABI's= [place] ?Implantable device outside of the clinic excluding No ?11/18/20 TBI R=.79 L=.82 ?cellular tissue based products placed in the center ?since last visit: ?Has Dressing in Place as Prescribed: Yes ?Has Compression in Place as Prescribed: No ?Pain Present Now: No ?Notes ?Did not have Juxtalites on ?Electronic Signature(s) ?Signed: 05/22/2021 5:00:45 PM By: Lorrin Jackson ?Entered By: Lorrin Jackson on 05/19/2021 11:34:16 ?-------------------------------------------------------------------------------- ?Clinic Level of Care Assessment Details ?Patient Name: Date of Service: ?[place], [name] 05/19/2021 11:00 A M ?Medical Record Number: [id number] ?Patient Account Number: [id number] ?Date of Birth/Sex: Treating RN: ?December 22, 1945 (76 y.o. [name] ?Primary Care Brance Dartt: [name] Other Clinician: ?Referring Marquail Bradwell: ?Treating Acxel Dingee/Extender: Kalman Shan ?[name] ?Weeks in Treatment: 3 ?Clinic Level of Care Assessment  Items ?TOOL 4 Quantity Score ?X- 1 0 ?Use when only an EandM is performed on FOLLOW-UP visit ?ASSESSMENTS - Nursing Assessment / Reassessment ?X- 1 10 ?Reassessment of Co-morbidities (includes updates in patient status) ?X- 1 5 ?Reassessment of Adherence to Treatment Plan ?ASSESSMENTS - Wound and Skin A ssessment / Reassessment ?X - Simple Wound Assessment / Reassessment - one wound 1 5 ?'[]'$  - 0 ?Complex Wound Assessment / Reassessment - multiple wounds ?'[]'$  - 0 ?Dermatologic / Skin Assessment (not related to wound area) ?ASSESSMENTS - Focused Assessment ?'[]'$  - 0 ?Circumferential Edema Measurements - multi extremities ?'[]'$  - 0 ?Nutritional Assessment / Counseling / Intervention ?'[]'$  - 0 ?Lower Extremity Assessment (monofilament, tuning fork, pulses) ?'[]'$  - 0 ?Peripheral Arterial Disease Assessment (using hand held doppler) ?ASSESSMENTS - Ostomy and/or Continence Assessment and Care ?'[]'$  - 0 ?Incontinence Assessment and Management ?'[]'$  - 0 ?Ostomy Care Assessment and Management (repouching, etc.) ?PROCESS - Coordination of Care ?X - Simple Patient / Family Education for ongoing care 1 15 ?'[]'$  - 0 ?Complex (extensive) Patient / Family Education for ongoing care ?'[]'$  - 0 ?Staff obtains Consents, Records, T Results / Process Orders ?est ?'[]'$  - 0 ?Staff telephones [place], Nursing Homes / Clarify orders / etc ?'[]'$  - 0 ?Routine Transfer to another Facility (non-emergent condition) ?'[]'$  - 0 ?Routine Hospital Admission (non-emergent condition) ?'[]'$  - 0 ?New Admissions / Biomedical engineer / Ordering NPWT Apligraf, etc. ?, ?'[]'$  - 0 ?Emergency Hospital Admission (emergent condition) ?X- 1 10 ?Simple Discharge Coordination ?'[]'$  - 0 ?Complex (extensive) Discharge Coordination ?PROCESS - Special Needs ?'[]'$  - 0 ?Pediatric / Minor Patient Management ?'[]'$  - 0 ?Isolation Patient Management ?'[]'$  - 0 ?Hearing / Language / Visual special needs ?'[]'$  - 0 ?Assessment of Community assistance (transportation, D/C planning, etc.) ?'[]'$  - 0 ?Additional  assistance / Altered mentation ?'[]'$  - 0 ?Support Surface(s) Assessment (bed, cushion, seat, etc.) ?INTERVENTIONS - Wound Cleansing / Measurement ?'[]'$  -  0 ?Simple Wound Cleansing - one wound ?'[]'$  - 0 ?Complex Wound Cleansing - multiple wounds ?X- 1 5 ?Wound Imaging (photographs - any number of wounds) ?'[]'$  - 0 ?Wound Tracing (instead of photographs) ?'[]'$  - 0 ?Simple Wound Measurement - one wound ?'[]'$  - 0 ?Complex Wound Measurement - multiple wounds ?INTERVENTIONS - Wound Dressings ?'[]'$  - 0 ?Small Wound Dressing one or multiple wounds ?'[]'$  - 0 ?Medium Wound Dressing one or multiple wounds ?'[]'$  - 0 ?Large Wound Dressing one or multiple wounds ?'[]'$  - 0 ?Application of Medications - topical ?'[]'$  - 0 ?Application of Medications - injection ?INTERVENTIONS - Miscellaneous ?'[]'$  - 0 ?External ear exam ?'[]'$  - 0 ?Specimen Collection (cultures, biopsies, blood, body fluids, etc.) ?'[]'$  - 0 ?Specimen(s) / Culture(s) sent or taken to Lab for analysis ?'[]'$  - 0 ?Patient Transfer (multiple staff / Civil Service fast streamer / Similar devices) ?'[]'$  - 0 ?Simple Staple / Suture removal (25 or less) ?'[]'$  - 0 ?Complex Staple / Suture removal (26 or more) ?'[]'$  - 0 ?Hypo / Hyperglycemic Management (close monitor of Blood Glucose) ?'[]'$  - 0 ?Ankle / Brachial Index (ABI) - do not check if billed separately ?X- 1 5 ?Vital Signs ?Has the patient been seen at the hospital within the last three years: Yes ?Total Score: 55 ?Level Of Care: New/Established - Level 2 ?Electronic Signature(s) ?Signed: 05/22/2021 5:00:45 PM By: Lorrin Jackson ?Entered By: Lorrin Jackson on 05/19/2021 12:02:40 ?-------------------------------------------------------------------------------- ?Encounter Discharge Information Details ?Patient Name: Date of Service: ?[place], [name] 05/19/2021 11:00 A M ?Medical Record Number: [id number] ?Patient Account Number: [id number] ?Date of Birth/Sex: Treating RN: ?Jun 07, 1945 (76 y.o. [name] ?Primary Care Japneet Staggs: [name] Other  Clinician: ?Referring Jaleiyah Alas: ?Treating Dynastee Brummell/Extender: Kalman Shan ?[name] ?Weeks in Treatment: 3 ?Encounter Discharge Information Items ?Discharge Condition: Stable ?Ambulatory Status: Kasandra Knudsen ?Discharge Destination: Home ?Transportation: Private Auto ?Schedule Follow-up Appointment: No ?Clinical Summary of Care: Provided on 05/19/2021 ?Form Type Recipient ?Paper Patient Patient ?Electronic Signature(s) ?Signed: 05/19/2021 12:25:02 PM By: Lorrin Jackson ?Entered By: Lorrin Jackson on 05/19/2021 12:25:02 ?-------------------------------------------------------------------------------- ?Lower Extremity Assessment Details ?Patient Name: Date of Service: ?[place], [name] 05/19/2021 11:00 A M ?Medical Record Number: [id number] ?Patient Account Number: [id number] ?Date of Birth/Sex: Treating RN: ?Apr 03, 1945 (76 y.o. [name] ?Primary Care Persephonie Hegwood: [name] Other Clinician: ?Referring Cordelle Dahmen: ?Treating Laritza Vokes/Extender: Kalman Shan ?[name] ?Weeks in Treatment: 3 ?Edema Assessment ?Assessed: [Left: No] [Right: Yes] ?Edema: [Left: Ye] [Right: s] ?Calf ?Left: Right: ?Point of Measurement: 34 cm From Medial Instep 42.5 cm ?Ankle ?Left: Right: ?Point of Measurement: 10 cm From Medial Instep 25.4 cm ?Vascular Assessment ?Pulses: ?Dorsalis Pedis ?Palpable: [Right:Yes] ?Electronic Signature(s) ?Signed: 05/22/2021 5:00:45 PM By: Lorrin Jackson ?Entered By: Lorrin Jackson on 05/19/2021 11:29:10 ?-------------------------------------------------------------------------------- ?Multi Wound Chart Details ?Patient Name: ?Date of Service: ?[place], [name] 05/19/2021 11:00 A M ?Medical Record Number: [id number] ?Patient Account Number: [id number] ?Date of Birth/Sex: ?Treating RN: ?October 11, 1945 (76 y.o. [name] ?Primary Care Jadore Mcguffin: [name] ?Other Clinician: ?Referring Maryna Yeagle: ?Treating Hadeel Hillebrand/Extender: Kalman Shan ?[name] ?Weeks in Treatment: 3 ?Vital  Signs ?Height(in): 71 ?Pulse(bpm): 61 ?Weight(lbs): 302 ?Blood Pressure(mmHg): 130/76 ?Body Mass Index(BMI): 42.1 ?Temperature(??F): 98.4 ?Respiratory Rate(breaths/min): 20 ?Photos: [N/A:N/A] ?Right, Proximal, Lateral Lower Leg N/A N/

## 2021-05-23 ENCOUNTER — Encounter: Payer: Self-pay | Admitting: Nurse Practitioner

## 2021-05-23 ENCOUNTER — Ambulatory Visit (INDEPENDENT_AMBULATORY_CARE_PROVIDER_SITE_OTHER): Payer: Medicare Other | Admitting: Nurse Practitioner

## 2021-05-23 ENCOUNTER — Telehealth: Payer: Self-pay

## 2021-05-23 VITALS — BP 118/80 | HR 62 | Temp 98.1°F | Ht 65.0 in | Wt 301.6 lb

## 2021-05-23 DIAGNOSIS — Z6841 Body Mass Index (BMI) 40.0 and over, adult: Secondary | ICD-10-CM | POA: Diagnosis not present

## 2021-05-23 DIAGNOSIS — I10 Essential (primary) hypertension: Secondary | ICD-10-CM | POA: Diagnosis not present

## 2021-05-23 DIAGNOSIS — E662 Morbid (severe) obesity with alveolar hypoventilation: Secondary | ICD-10-CM | POA: Diagnosis not present

## 2021-05-23 DIAGNOSIS — E782 Mixed hyperlipidemia: Secondary | ICD-10-CM | POA: Diagnosis not present

## 2021-05-23 DIAGNOSIS — E119 Type 2 diabetes mellitus without complications: Secondary | ICD-10-CM

## 2021-05-23 MED ORDER — ZOSTER VAC RECOMB ADJUVANTED 50 MCG/0.5ML IM SUSR: 0.5000 mL | Freq: Once | INTRAMUSCULAR | 1 refills | Status: AC

## 2021-05-23 NOTE — Telephone Encounter (Signed)
Chmg-error.  

## 2021-05-23 NOTE — Patient Instructions (Signed)
Hypertension, Adult ?Hypertension is another name for high blood pressure. High blood pressure forces your heart to work harder to pump blood. This can cause problems over time. ?There are two numbers in a blood pressure reading. There is a top number (systolic) over a bottom number (diastolic). It is best to have a blood pressure that is below 120/80. ?What are the causes? ?The cause of this condition is not known. Some other conditions can lead to high blood pressure. ?What increases the risk? ?Some lifestyle factors can make you more likely to develop high blood pressure: ?Smoking. ?Not getting enough exercise or physical activity. ?Being overweight. ?Having too much fat, sugar, calories, or salt (sodium) in your diet. ?Drinking too much alcohol. ?Other risk factors include: ?Having any of these conditions: ?Heart disease. ?Diabetes. ?High cholesterol. ?Kidney disease. ?Obstructive sleep apnea. ?Having a family history of high blood pressure and high cholesterol. ?Age. The risk increases with age. ?Stress. ?What are the signs or symptoms? ?High blood pressure may not cause symptoms. Very high blood pressure (hypertensive crisis) may cause: ?Headache. ?Fast or uneven heartbeats (palpitations). ?Shortness of breath. ?Nosebleed. ?Vomiting or feeling like you may vomit (nauseous). ?Changes in how you see. ?Very bad chest pain. ?Feeling dizzy. ?Seizures. ?How is this treated? ?This condition is treated by making healthy lifestyle changes, such as: ?Eating healthy foods. ?Exercising more. ?Drinking less alcohol. ?Your doctor may prescribe medicine if lifestyle changes do not help enough and if: ?Your top number is above 130. ?Your bottom number is above 80. ?Your personal target blood pressure may vary. ?Follow these instructions at home: ?Eating and drinking ? ?If told, follow the DASH eating plan. To follow this plan: ?Fill one half of your plate at each meal with fruits and vegetables. ?Fill one fourth of your plate  at each meal with whole grains. Whole grains include whole-wheat pasta, brown rice, and whole-grain bread. ?Eat or drink low-fat dairy products, such as skim milk or low-fat yogurt. ?Fill one fourth of your plate at each meal with low-fat (lean) proteins. Low-fat proteins include fish, chicken without skin, eggs, beans, and tofu. ?Avoid fatty meat, cured and processed meat, or chicken with skin. ?Avoid pre-made or processed food. ?Limit the amount of salt in your diet to less than 1,500 mg each day. ?Do not drink alcohol if: ?Your doctor tells you not to drink. ?You are pregnant, may be pregnant, or are planning to become pregnant. ?If you drink alcohol: ?Limit how much you have to: ?0-1 drink a day for women. ?0-2 drinks a day for men. ?Know how much alcohol is in your drink. In the [place] one drink equals one 12 oz bottle of beer (355 mL), one 5 oz glass of wine (148 mL), or one 1? oz glass of hard liquor (44 mL). ?Lifestyle ? ?Work with your doctor to stay at a healthy weight or to lose weight. Ask your doctor what the best weight is for you. ?Get at least 30 minutes of exercise that causes your heart to beat faster (aerobic exercise) most days of the week. This may include walking, swimming, or biking. ?Get at least 30 minutes of exercise that strengthens your muscles (resistance exercise) at least 3 days a week. This may include lifting weights or doing Pilates. ?Do not smoke or use any products that contain nicotine or tobacco. If you need help quitting, ask your doctor. ?Check your blood pressure at home as told by your doctor. ?Keep all follow-up visits. ?Medicines ?Take over-the-counter and prescription medicines   only as told by your doctor. Follow directions carefully. ?Do not skip doses of blood pressure medicine. The medicine does not work as well if you skip doses. Skipping doses also puts you at risk for problems. ?Ask your doctor about side effects or reactions to medicines that you should watch  for. ?Contact a doctor if: ?You think you are having a reaction to the medicine you are taking. ?You have headaches that keep coming back. ?You feel dizzy. ?You have swelling in your ankles. ?You have trouble with your vision. ?Get help right away if: ?You get a very bad headache. ?You start to feel mixed up (confused). ?You feel weak or numb. ?You feel faint. ?You have very bad pain in your: ?Chest. ?Belly (abdomen). ?You vomit more than once. ?You have trouble breathing. ?These symptoms may be an emergency. Get help right away. Call 911. ?Do not wait to see if the symptoms will go away. ?Do not drive yourself to the hospital. ?Summary ?Hypertension is another name for high blood pressure. ?High blood pressure forces your heart to work harder to pump blood. ?For most people, a normal blood pressure is less than 120/80. ?Making healthy choices can help lower blood pressure. If your blood pressure does not get lower with healthy choices, you may need to take medicine. ?This information is not intended to replace advice given to you by your health care provider. Make sure you discuss any questions you have with your health care provider. ?Document Revised: 10/13/2020 Document Reviewed: 10/13/2020 ?Elsevier Patient Education ? 2023 [employer] ? ?

## 2021-05-23 NOTE — Progress Notes (Signed)
Ault, Zein L. ([id number]) ?Visit Report for 05/19/2021 ?Chief Complaint Document Details ?Patient Name: Date of Service: ?MCCO LLUM, [name] 05/19/2021 11:00 A M ?Medical Record Number: [id number] ?Patient Account Number: [id number] ?Date of Birth/Sex: Treating RN: ?1945/01/29 (76 y.o. [name] ?Primary Care Provider: [name] Other Clinician: ?Referring Provider: ?Treating Provider/Extender: Kalman Shan ?[name] ?Weeks in Treatment: 3 ?Information Obtained from: Patient ?Chief Complaint ?Bilateral lower extremity wounds ?Electronic Signature(s) ?Signed: 05/23/2021 5:13:38 PM By: Kalman Shan DO ?Entered By: Kalman Shan on 05/23/2021 17:10:06 ?-------------------------------------------------------------------------------- ?HPI Details ?Patient Name: Date of Service: ?MCCO LLUM, [name] 05/19/2021 11:00 A M ?Medical Record Number: [id number] ?Patient Account Number: [id number] ?Date of Birth/Sex: Treating RN: ?24-Jun-1945 (76 y.o. [name] ?Primary Care Provider: [name] Other Clinician: ?Referring Provider: ?Treating Provider/Extender: Kalman Shan ?[name] ?Weeks in Treatment: 3 ?History of Present Illness ?HPI Description: Admission 11/01/2020 ?Mr. Gaylord Seydel is a 76 year old male with a past medical history of controlled type 2 diabetes on oral medications, chronic venous insufficiency and ?persistent A. fib on Coumadin that presents to the clinic for A 1 year history of waxing and waning of bilateral lower extremity wounds. He states that his ?wounds opened up spontaneously. They have improved over the past 6 months. He was doing [employer] in August and September which helped heal his ?wounds. They have opened up again and he is using lotion and compression stockings. He currently denies signs of infection. ?11/3; patient presents for follow-up. He has no issues or complaints today. He has been using his juxta light compression daily with  Hydrofera Blue underneath. ?He denies signs of infection. ?11/8; patient presents for follow-up. He states that on removal of the Hydrofera Blue dressing the skin tore due to being tightly adhered to the wound bed. He ?has been using his juxta light compressions daily. He denies signs of infection. ?Readmission 04/27/2021 ?Patient presents with a 81-monthhistory of opening scattered wounds to his legs bilaterally. He was previously seen in our clinic for this issue last year. His last ?encounter was on 11/8 and he still had wounds at that time. He never followed up afterwards. He states that at one point all his wounds had healed up. He ?reports using his juxta lite compressions daily but thinks that the elasticity has worn out. He is currently been keeping the areas covered. He denies signs of ?infection. ?4/28; patient readmitted the clinic last week. He has wounds bilaterally I think secondary to chronic venous insufficiency with lymphedema. We saw him last ?fall discharged him with juxta lites. Unfortunately he has heard nothing about the juxta lites. He took the kerlix Coban wrap off today and showered before he ?came in. Otherwise everything seems okay. ?5/5; patient presents for follow-up. He has 1 remaining wound to the right lower extremity located to the proximal aspect. He has his juxta lite compressions ?today. He declines in office compression wrap today. ?5/12; patient presents for follow-up. He has been using antibiotic ointment to the remaining wound. He has been using his juxta light compression daily. He has ?no issues or complaints today. ?Electronic Signature(s) ?Signed: 05/23/2021 5:13:38 PM By: HKalman ShanDO ?Entered By: HKalman Shanon 05/23/2021 17:10:35 ?-------------------------------------------------------------------------------- ?Physical Exam Details ?Patient Name: Date of Service: ?MCCO LLUM, [name] 05/19/2021 11:00 A M ?Medical Record Number: [id number]?Patient Account  Number: [id number]?Date of Birth/Sex: Treating RN: ?102-28-1947((76y.o. MMarcheta Grammes?Primary Care Provider: MMinette BrineOther Clinician: ?Referring Provider: ?  Treating Provider/Extender: Kalman Shan ?[name] ?Weeks in Treatment: 3 ?Constitutional ?respirations regular, non-labored and within target range for patient.Marland Kitchen ?Cardiovascular ?2+ dorsalis pedis/posterior tibialis pulses. ?Psychiatric ?pleasant and cooperative. ?Notes ?Right lower extremity: T the upper lateral thigh there is a thin layer of epithelization to the previous wound site. No signs of surrounding infection. Surrounding ?o ?skin intact. ?Electronic Signature(s) ?Signed: 05/23/2021 5:13:38 PM By: Kalman Shan DO ?Entered By: Kalman Shan on 05/23/2021 17:11:20 ?-------------------------------------------------------------------------------- ?Physician Orders Details ?Patient Name: Date of Service: ?MCCO LLUM, [name] 05/19/2021 11:00 A M ?Medical Record Number: [id number] ?Patient Account Number: [id number] ?Date of Birth/Sex: Treating RN: ?September 12, 1945 (76 y.o. [name] ?Primary Care Provider: [name] Other Clinician: ?Referring Provider: ?Treating Provider/Extender: Kalman Shan ?[name] ?Weeks in Treatment: 3 ?Verbal / Phone Orders: No ?Diagnosis Coding ?ICD-10 Coding ?Code Description ?P29.518 Non-pressure chronic ulcer of other part of right lower leg with fat layer exposed ?A41.660 Non-pressure chronic ulcer of other part of left lower leg with fat layer exposed ?E11.622 Type 2 diabetes mellitus with other skin ulcer ?I87.2 Venous insufficiency (chronic) (peripheral) ?I89.0 Lymphedema, not elsewhere classified ?I48.19 Other persistent atrial fibrillation ?Z79.01 Long term (current) use of anticoagulants ?Discharge From [facility] ?Discharge from [facility] - No further follow up needed, call if any changes. ?Edema Control - Lymphedema / SCD / Other ?Patient to wear own compression  stockings every day. - Apply Juxtalite compression wrap to both legs first thing in morning and remove at ?bedtime. ?Moisturize legs daily. ?Non Wound Condition ?pply the following to affected area as directed: - apply antibiotic ointment to scabbed/dry areas. ?A ?Electronic Signature(s) ?Signed: 05/23/2021 5:13:38 PM By: Kalman Shan DO ?Previous Signature: 05/22/2021 5:00:45 PM Version By: Lorrin Jackson ?Entered By: Kalman Shan on 05/23/2021 17:11:35 ?-------------------------------------------------------------------------------- ?Problem List Details ?Patient Name: ?Date of Service: ?MCCO LLUM, [name] 05/19/2021 11:00 A M ?Medical Record Number: [id number] ?Patient Account Number: [id number] ?Date of Birth/Sex: ?Treating RN: ?19-May-1945 (76 y.o. [name] ?Primary Care Provider: [name] ?Other Clinician: ?Referring Provider: ?Treating Provider/Extender: Kalman Shan ?[name] ?Weeks in Treatment: 3 ?Active Problems ?ICD-10 ?Encounter ?Code Description Active Date MDM ?Diagnosis ?N23.557 Non-pressure chronic ulcer of other part of right lower leg with fat layer 04/27/2021 No Yes ?exposed ?D22.025 Non-pressure chronic ulcer of other part of left lower leg with fat layer exposed4/20/2023 No Yes ?E11.622 Type 2 diabetes mellitus with other skin ulcer 04/27/2021 No Yes ?I87.2 Venous insufficiency (chronic) (peripheral) 04/27/2021 No Yes ?I89.0 Lymphedema, not elsewhere classified 04/27/2021 No Yes ?I48.19 Other persistent atrial fibrillation 04/27/2021 No Yes ?Z79.01 Long term (current) use of anticoagulants 04/27/2021 No Yes ?Inactive Problems ?Resolved Problems ?Electronic Signature(s) ?Signed: 05/23/2021 5:13:38 PM By: Kalman Shan DO ?Previous Signature: 05/22/2021 5:00:45 PM Version By: Lorrin Jackson ?Entered By: Kalman Shan on 05/23/2021 17:09:27 ?-------------------------------------------------------------------------------- ?Progress Note Details ?Patient Name: Date of  Service: ?MCCO LLUM, [name] 05/19/2021 11:00 A M ?Medical Record Number: [id number] ?Patient Account Number: [id number] ?Date of Birth/Sex: Treating RN: ?1945-12-23 (76 y.o. [name] ?Primary Care Provid

## 2021-05-23 NOTE — Progress Notes (Signed)
I,Brett [name],acting as a Education administrator for Brett Brine, FNP.,have documented all relevant documentation on the behalf of Brett Brine, FNP,as directed by  Brett Brine, FNP while in the presence of Brett [name], [place].   This visit occurred during the SARS-CoV-2 public health emergency.  Safety protocols were in place, including screening questions prior to the visit, additional usage of staff PPE, and extensive cleaning of exam room while observing appropriate contact time as indicated for disinfecting solutions.  Subjective:     Patient ID: Brett [name] , male    DOB: 09-Mar-1945 , 76 y.o.   MRN: [id number]   Chief Complaint  Patient presents with   Hypertension   Diabetes    HPI  Pt presents today for bp & dm f/u. He has completed his treatment for his lower extremity edema from the wound care center.   Hypertension This is a chronic problem. The current episode started more than 1 year ago. The problem has been rapidly improving since onset. The problem is uncontrolled. Pertinent negatives include no anxiety, chest pain, headaches or palpitations. There are no associated agents to hypertension. Risk factors for coronary artery disease include obesity and male gender. Past treatments include ACE inhibitors and alpha 1 blockers. The current treatment provides moderate improvement. There are no compliance problems.  There is no history of angina or kidney disease. There is no history of chronic renal disease.  Diabetes He presents for his follow-up diabetic visit. He has type 2 diabetes mellitus. His disease course has been stable. Pertinent negatives for hypoglycemia include no dizziness or headaches. Pertinent negatives for diabetes include no chest pain. There are no hypoglycemic complications. There are no diabetic complications. Risk factors for coronary artery disease include sedentary lifestyle, obesity, male sex, hypertension and diabetes mellitus. Current diabetic treatment  includes oral agent (dual therapy). He is compliant with treatment all of the time. He is following a generally unhealthy diet. When asked about meal planning, he reported none. He has not had a previous visit with a dietitian. He rarely participates in exercise. There is no change in his home blood glucose trend. (Blood sugar 118 today, 2 hours after eating 127. ) He does not see a podiatrist.   Past Medical History:  Diagnosis Date   DVT of lower extremity, bilateral ([place]) 06/17/2012   Right CFV and popliteal vein, left C. it the only;  also interstitial fluid noted in both calfs   Dyslipidemia    History of echocardiogram 02/07/2011   EF 50-60%; RV mildly dilated, LA moderately dilated; mild MR; mild-mod TR; RV systolic pressure elevated; mildly sclerotic AV   History of nuclear stress test 02/07/2011   lexiscan; mild ischemia in mid inferolateral & apical lateral regions; low risk scan   History of: Bacteremia due to coagulase-negative Staphylococcus 06/13/2012   During hospitalization May 30 through 06/19/2012    History: TTP (thrombotic thrombocytopenic purpura) 06/11/2012   Hypertension    Obesity (BMI 30-39.9)    OSA on CPAP    Persistent atrial fibrillation ([place]) 02/2011   Negative Myoview; Normal EF by Echo   Severe sepsis with acute organ dysfunction ([place]) 5/30-6/12/2012   Prolonged hospitalization for presumed sepsis and septic shock compounded by coagulopathy thought to be TTP versus Pradaxa mediated. Temporarily on hemodialysis and plasmapheresis.     Family History  Problem Relation Age of Onset   Heart failure Mother 27   Cancer Father        stomach     Current Outpatient  Medications:    acetaminophen (TYLENOL) 500 MG tablet, Take 1,000 mg by mouth daily. , Disp: , Rfl:    ammonium lactate (AMLACTIN) 12 % lotion, Apply 1 application topically as needed for dry skin., Disp: 400 g, Rfl: 3   blood glucose meter kit and supplies KIT, Dispense based on patient and insurance  preference. Use up to four times daily as directed. (FOR ICD-9 250.00, 250.01)., Disp: 1 each, Rfl: 0   Blood Glucose Monitoring Suppl (FREESTYLE LITE) DEVI, Use to test blood sugar 3 times a day. Dx code e11.65, Disp: 1 each, Rfl: 3   diltiazem (CARDIZEM CD) 240 MG 24 hr capsule, TAKE 1 CAPSULE DAILY, Disp: 90 capsule, Rfl: 3   diphenhydramine-acetaminophen (TYLENOL PM) 25-500 MG TABS, Take 2 tablets by mouth at bedtime., Disp: , Rfl:    fish oil-omega-3 fatty acids 1000 MG capsule, Take 1 g by mouth daily., Disp: , Rfl:    FREESTYLE LITE test strip, TEST UP TO FOUR TIMES A DAY AS DIRECTED, Disp: 300 strip, Rfl: 3   furosemide (LASIX) 20 MG tablet, TAKE 1 TABLET TWICE A DAY, MAY TAKE AN EXTRA 20 MG ( 1 TABLET ) AS NEEDED DAILY. ( MUST KEEP UPCOMING APPOINTMENT FOR FUTURE REFILLS ), Disp: 90 tablet, Rfl: 0   hydrALAZINE (APRESOLINE) 50 MG tablet, TAKE 1 TABLET TWICE A DAY WITH FOOD, Disp: 180 tablet, Rfl: 3   JARDIANCE 10 MG TABS tablet, TAKE 1 TABLET DAILY BEFORE BREAKFAST, Disp: 90 tablet, Rfl: 3   Lancets (FREESTYLE) lancets, TEST UP TO FOUR TIMES A DAY AS DIRECTED, Disp: 300 each, Rfl: 3   loratadine (CLARITIN) 10 MG tablet, TAKE 1 TABLET DAILY, Disp: 90 tablet, Rfl: 3   metFORMIN (GLUCOPHAGE-XR) 750 MG 24 hr tablet, TAKE 1 TABLET DAILY, Disp: 90 tablet, Rfl: 3   metoprolol tartrate (LOPRESSOR) 100 MG tablet, TAKE 1 TABLET THREE TIMES A DAY, Disp: 270 tablet, Rfl: 3   Multiple Vitamin (MULITIVITAMIN WITH MINERALS) TABS, Take 1 tablet by mouth daily., Disp: , Rfl:    NON FORMULARY, at bedtime. CPAP, Disp: , Rfl:    pravastatin (PRAVACHOL) 80 MG tablet, TAKE 1 TABLET DAILY, Disp: 90 tablet, Rfl: 3   warfarin (COUMADIN) 4 MG tablet, TAKE ONE TO ONE AND ONE-HALF TABLETS DAILY AS DIRECTED PER COUMADIN CLINIC, Disp: 135 tablet, Rfl: 3   TRULICITY 6.60 YT/0.1SW SOPN, INJECT 0.75 MG UNDER THE SKIN ONCE A WEEK, Disp: 6 mL, Rfl: 3   No Known Allergies   Review of Systems  Constitutional: Negative.    HENT: Negative.    Respiratory: Negative.    Cardiovascular:  Negative for chest pain and palpitations.  Gastrointestinal: Negative.   Musculoskeletal: Negative.   Skin: Negative.   Allergic/Immunologic: Negative.   Neurological:  Negative for dizziness and headaches.  Hematological: Negative.     Today's Vitals   05/23/21 1537  BP: 118/80  Pulse: 62  Temp: 98.1 F (36.7 C)  SpO2: 98%  Weight: (!) 301 lb 9.6 oz (136.8 kg)  Height: _0  (1.651 m)   Body mass index is 50.19 kg/m.   Objective:  Physical Exam Vitals reviewed.  Constitutional:      General: He is not in acute distress.    Appearance: Normal appearance. He is obese.     Comments: Central obesity  Cardiovascular:     Rate and Rhythm: Normal rate and regular rhythm.     Pulses: Normal pulses.     Heart sounds: Normal heart sounds. No murmur heard.  Pulmonary:     Effort: No respiratory distress.     Breath sounds: Normal breath sounds. No wheezing.  Musculoskeletal:        General: No swelling.     Right lower leg: Edema (trace edema - much improved with his wraps) present.     Left lower leg: Edema (trace edema - wearing wraps) present.     Comments: Using a cane to ambulate. has a few cratered areas. He is wearing compressive garments to lower extremities  Skin:    General: Skin is warm and dry.     Capillary Refill: Capillary refill takes less than 2 seconds.     Findings: No rash.     Comments: Lower extremities are reddened with scaly skin, firm to touch  Neurological:     General: No focal deficit present.     Mental Status: He is alert and oriented to person, place, and time.     Cranial Nerves: No cranial nerve deficit.     Motor: No weakness.  Psychiatric:        Mood and Affect: Mood normal.        Behavior: Behavior normal.        Thought Content: Thought content normal.        Judgment: Judgment normal.        Assessment And Plan:     1. Type 2 diabetes mellitus without  complication, without long-term current use of insulin (HCC) Comments: HgbA1c has been stable, he is doing well with his medications.  - Hemoglobin A1c - CMP14+EGFR  2. Essential hypertension Comments: Blood pressure is well controlled, continue current medications - CMP14+EGFR  3. Mixed hyperlipidemia Comments: Continue statin, tolerating well.  - CMP14+EGFR - Lipid panel  4. Class 3 obesity with alveolar hypoventilation and body mass index (BMI) of 45.0 to 49.9 in adult, unspecified whether serious comorbidity present [facility]) He is encouraged to strive for BMI less than 30 to decrease cardiac risk. Advised to aim for at least 150 minutes of exercise per week.   Shingrix was rejected with TransRx.   Patient was given opportunity to ask questions. Patient verbalized understanding of the plan and was able to repeat key elements of the plan. All questions were answered to their satisfaction.  Brett Brine, FNP    I, Brett Brine, FNP, have reviewed all documentation for this visit. The documentation on 05/23/21 for the exam, diagnosis, procedures, and orders are all accurate and complete  IF YOU HAVE BEEN REFERRED TO A SPECIALIST, IT MAY TAKE 1-2 WEEKS TO SCHEDULE/PROCESS THE REFERRAL. IF YOU HAVE NOT HEARD FROM US/SPECIALIST IN TWO WEEKS, PLEASE GIVE [place] A CALL AT [phone].   THE PATIENT IS ENCOURAGED TO PRACTICE SOCIAL DISTANCING DUE TO THE COVID-19 PANDEMIC.

## 2021-05-24 LAB — HEMOGLOBIN A1C
Est. average glucose Bld gHb Est-mCnc: 126 mg/dL
Hgb A1c MFr Bld: 6 % — ABNORMAL HIGH (ref 4.8–5.6)

## 2021-05-24 LAB — CMP14+EGFR
ALT: 17 IU/L (ref 0–44)
AST: 23 IU/L (ref 0–40)
Albumin/Globulin Ratio: 1.4 (ref 1.2–2.2)
Albumin: 4.3 g/dL (ref 3.7–4.7)
Alkaline Phosphatase: 75 IU/L (ref 44–121)
BUN/Creatinine Ratio: 17 (ref 10–24)
BUN: 20 mg/dL (ref 8–27)
Bilirubin Total: 0.6 mg/dL (ref 0.0–1.2)
CO2: 26 mmol/L (ref 20–29)
Calcium: 9.6 mg/dL (ref 8.6–10.2)
Chloride: 100 mmol/L (ref 96–106)
Creatinine, Ser: 1.15 mg/dL (ref 0.76–1.27)
Globulin, Total: 3.1 g/dL (ref 1.5–4.5)
Glucose: 84 mg/dL (ref 70–99)
Potassium: 4 mmol/L (ref 3.5–5.2)
Sodium: 141 mmol/L (ref 134–144)
Total Protein: 7.4 g/dL (ref 6.0–8.5)
eGFR: 66 mL/min/{1.73_m2} (ref >59)

## 2021-05-24 LAB — LIPID PANEL
Chol/HDL Ratio: 4.3 ratio (ref 0.0–5.0)
Cholesterol, Total: 111 mg/dL (ref 100–199)
HDL: 26 mg/dL — ABNORMAL LOW (ref >39)
LDL Chol Calc (NIH): 57 mg/dL (ref 0–99)
Triglycerides: 166 mg/dL — ABNORMAL HIGH (ref 0–149)
VLDL Cholesterol Cal: 28 mg/dL (ref 5–40)

## 2021-06-02 ENCOUNTER — Other Ambulatory Visit: Payer: Self-pay | Admitting: Nurse Practitioner

## 2021-06-02 DIAGNOSIS — E119 Type 2 diabetes mellitus without complications: Secondary | ICD-10-CM

## 2021-06-12 ENCOUNTER — Other Ambulatory Visit: Payer: Self-pay | Admitting: Cardiology

## 2021-06-12 MED ORDER — FUROSEMIDE 20 MG PO TABS: 20.0000 mg | ORAL_TABLET | Freq: Two times a day (BID) | ORAL | 0 refills | Status: DC

## 2021-06-15 ENCOUNTER — Telehealth: Payer: Self-pay | Admitting: Cardiology

## 2021-06-15 NOTE — Telephone Encounter (Signed)
Note Pt c/o medication issue:   1. Name of Medication: furosemide (LASIX) 20 MG tablet   2. How are you currently taking this medication (dosage and times per day)? Take 1 tablet (20 mg total) by mouth 2 (two) times daily.   3. Are you having a reaction (difficulty breathing--STAT)? No    4. What is your medication issue? Patient needs a new 90 day prescription filled with 3 refills sent to  [place], [place]

## 2021-06-15 NOTE — Telephone Encounter (Signed)
Pt c/o medication issue:  1. Name of Medication: furosemide (LASIX) 20 MG tablet  2. How are you currently taking this medication (dosage and times per day)? Take 1 tablet (20 mg total) by mouth 2 (two) times daily.  3. Are you having a reaction (difficulty breathing--STAT)? No   4. What is your medication issue? Patient needs a new 90 day prescription filled with 3 refills sent to  [place], [place]

## 2021-06-16 MED ORDER — FUROSEMIDE 20 MG PO TABS: 20.0000 mg | ORAL_TABLET | Freq: Two times a day (BID) | ORAL | 0 refills | Status: DC

## 2021-06-19 ENCOUNTER — Other Ambulatory Visit: Payer: Self-pay | Admitting: Nurse Practitioner

## 2021-07-05 ENCOUNTER — Ambulatory Visit (INDEPENDENT_AMBULATORY_CARE_PROVIDER_SITE_OTHER): Payer: Medicare Other | Admitting: *Deleted

## 2021-07-05 DIAGNOSIS — Z7901 Long term (current) use of anticoagulants: Secondary | ICD-10-CM

## 2021-07-05 DIAGNOSIS — I4819 Other persistent atrial fibrillation: Secondary | ICD-10-CM | POA: Diagnosis not present

## 2021-07-05 LAB — POCT INR: INR: 2.3 (ref 2.0–3.0)

## 2021-07-05 NOTE — Patient Instructions (Addendum)
Description   Continue taking 1.5 tablets daily except 1 tablet on Sunday. Repeat INR in 8 weeks.  [phone]

## 2021-07-17 ENCOUNTER — Other Ambulatory Visit: Payer: Self-pay | Admitting: Nurse Practitioner

## 2021-08-30 ENCOUNTER — Ambulatory Visit (INDEPENDENT_AMBULATORY_CARE_PROVIDER_SITE_OTHER): Payer: Medicare Other | Admitting: *Deleted

## 2021-08-30 ENCOUNTER — Other Ambulatory Visit: Payer: Self-pay | Admitting: *Deleted

## 2021-08-30 DIAGNOSIS — I4819 Other persistent atrial fibrillation: Secondary | ICD-10-CM

## 2021-08-30 DIAGNOSIS — Z7901 Long term (current) use of anticoagulants: Secondary | ICD-10-CM

## 2021-08-30 LAB — POCT INR: INR: 1.9 — AB (ref 2.0–3.0)

## 2021-08-30 NOTE — Telephone Encounter (Signed)
Pt requests a local 30 day supply of lasix sent to [facility] and a long term supply of lasix sent to [facility]. Thanks

## 2021-08-30 NOTE — Patient Instructions (Signed)
Description   Today take 2 tablets then continue taking 1.5 tablets daily except 1 tablet on Sunday. Repeat INR in 7 weeks.  [phone]

## 2021-09-01 MED ORDER — FUROSEMIDE 20 MG PO TABS: 20.0000 mg | ORAL_TABLET | Freq: Two times a day (BID) | ORAL | 2 refills | Status: DC

## 2021-09-15 ENCOUNTER — Telehealth: Payer: Self-pay | Admitting: Cardiology

## 2021-09-15 NOTE — Telephone Encounter (Signed)
*  STAT* If patient is at the pharmacy, call can be transferred to refill team.   1. Which medications need to be refilled? (please list name of each medication and dose if known)   furosemide (LASIX) 20 MG tablet     2. Which pharmacy/location (including street and city if local pharmacy) is medication to be sent to?    [facility] [place], [place]  3. Do they need a 30 day or 90 day supply? 90  Pharmacy calling for refill, pt is completely out. Previous refill [facility] to wrong pharmacy. [facility]

## 2021-09-18 MED ORDER — FUROSEMIDE 20 MG PO TABS: 20.0000 mg | ORAL_TABLET | Freq: Two times a day (BID) | ORAL | 2 refills | Status: DC

## 2021-09-25 ENCOUNTER — Encounter: Payer: Self-pay | Admitting: Nurse Practitioner

## 2021-09-25 ENCOUNTER — Ambulatory Visit (INDEPENDENT_AMBULATORY_CARE_PROVIDER_SITE_OTHER): Payer: Medicare Other | Admitting: Nurse Practitioner

## 2021-09-25 VITALS — BP 130/70 | HR 67 | Temp 98.3°F | Ht 70.0 in | Wt 298.0 lb

## 2021-09-25 DIAGNOSIS — I4819 Other persistent atrial fibrillation: Secondary | ICD-10-CM

## 2021-09-25 DIAGNOSIS — E1169 Type 2 diabetes mellitus with other specified complication: Secondary | ICD-10-CM

## 2021-09-25 DIAGNOSIS — E782 Mixed hyperlipidemia: Secondary | ICD-10-CM | POA: Diagnosis not present

## 2021-09-25 DIAGNOSIS — Z6841 Body Mass Index (BMI) 40.0 and over, adult: Secondary | ICD-10-CM

## 2021-09-25 DIAGNOSIS — I10 Essential (primary) hypertension: Secondary | ICD-10-CM

## 2021-09-25 DIAGNOSIS — E119 Type 2 diabetes mellitus without complications: Secondary | ICD-10-CM | POA: Diagnosis not present

## 2021-09-25 DIAGNOSIS — Z23 Encounter for immunization: Secondary | ICD-10-CM

## 2021-09-25 NOTE — Patient Instructions (Signed)

## 2021-09-25 NOTE — Progress Notes (Signed)
I,Tianna Badgett,acting as a Education administrator for [employer], FNP.,have documented all relevant documentation on the behalf of [name], FNP,as directed by  [name], FNP while in the presence of [name], [place].  Subjective:     Patient ID: Brett [name] , male    DOB: Mar 18, 1945 , 76 y.o.   MRN: [id number]   Chief Complaint  Patient presents with   Diabetes   Hypertension    HPI  Pt presents today for bp & dm f/u. Denies any refills.   Hypertension This is a chronic problem. The current episode started more than 1 year ago. The problem has been rapidly improving since onset. The problem is uncontrolled. Pertinent negatives include no anxiety, chest pain, headaches or palpitations. There are no associated agents to hypertension. Risk factors for coronary artery disease include obesity and male gender. Past treatments include ACE inhibitors and alpha 1 blockers. The current treatment provides moderate improvement. There are no compliance problems.  There is no history of angina or kidney disease. There is no history of chronic renal disease.  Diabetes He presents for his follow-up diabetic visit. He has type 2 diabetes mellitus. His disease course has been stable. Pertinent negatives for hypoglycemia include no dizziness or headaches. Pertinent negatives for diabetes include no chest pain. There are no hypoglycemic complications. There are no diabetic complications. Risk factors for coronary artery disease include sedentary lifestyle, obesity, male sex, hypertension and diabetes mellitus. Current diabetic treatment includes oral agent (dual therapy). He is compliant with treatment all of the time. He is following a generally unhealthy diet. When asked about meal planning, he reported none. He has not had a previous visit with a dietitian. He rarely participates in exercise. There is no change in his home blood glucose trend. (Blood sugars (fasting) 98 - 142 (2 hours after eating)) He does  not see a podiatrist.    Past Medical History:  Diagnosis Date   DVT of lower extremity, bilateral ([place]) 06/17/2012   Right CFV and popliteal vein, left C. it the only;  also interstitial fluid noted in both calfs   Dyslipidemia    History of echocardiogram 02/07/2011   EF 50-60%; RV mildly dilated, LA moderately dilated; mild MR; mild-mod TR; RV systolic pressure elevated; mildly sclerotic AV   History of nuclear stress test 02/07/2011   lexiscan; mild ischemia in mid inferolateral & apical lateral regions; low risk scan   History of: Bacteremia due to coagulase-negative Staphylococcus 06/13/2012   During hospitalization May 30 through 06/19/2012    History: TTP (thrombotic thrombocytopenic purpura) 06/11/2012   Hypertension    Obesity (BMI 30-39.9)    OSA on CPAP    Persistent atrial fibrillation ([place]) 02/2011   Negative Myoview; Normal EF by Echo   Severe sepsis with acute organ dysfunction ([place]) 5/30-6/12/2012   Prolonged hospitalization for presumed sepsis and septic shock compounded by coagulopathy thought to be TTP versus Pradaxa mediated. Temporarily on hemodialysis and plasmapheresis.     Family History  Problem Relation Age of Onset   Heart failure Mother 56   Cancer Father        stomach     Current Outpatient Medications:    acetaminophen (TYLENOL) 500 MG tablet, Take 1,000 mg by mouth daily. , Disp: , Rfl:    ammonium lactate (AMLACTIN) 12 % lotion, Apply 1 application topically as needed for dry skin., Disp: 400 g, Rfl: 3   blood glucose meter kit and supplies KIT, Dispense based on patient and insurance  preference. Use up to four times daily as directed. (FOR ICD-9 250.00, 250.01)., Disp: 1 each, Rfl: 0   Blood Glucose Monitoring Suppl (FREESTYLE LITE) DEVI, Use to test blood sugar 3 times a day. Dx code e11.65, Disp: 1 each, Rfl: 3   diltiazem (CARDIZEM CD) 240 MG 24 hr capsule, TAKE 1 CAPSULE DAILY, Disp: 90 capsule, Rfl: 3   diphenhydramine-acetaminophen (TYLENOL PM)  25-500 MG TABS, Take 2 tablets by mouth at bedtime., Disp: , Rfl:    fish oil-omega-3 fatty acids 1000 MG capsule, Take 1 g by mouth daily., Disp: , Rfl:    FREESTYLE LITE test strip, TEST UP TO FOUR TIMES A DAY AS DIRECTED, Disp: 300 strip, Rfl: 3   furosemide (LASIX) 20 MG tablet, Take 1 tablet (20 mg total) by mouth 2 (two) times daily., Disp: 180 tablet, Rfl: 2   hydrALAZINE (APRESOLINE) 50 MG tablet, TAKE 1 TABLET TWICE A DAY WITH FOOD, Disp: 180 tablet, Rfl: 3   JARDIANCE 10 MG TABS tablet, TAKE 1 TABLET DAILY BEFORE BREAKFAST, Disp: 90 tablet, Rfl: 3   Lancets (FREESTYLE) lancets, TEST UP TO FOUR TIMES A DAY AS DIRECTED, Disp: 300 each, Rfl: 3   loratadine (CLARITIN) 10 MG tablet, TAKE 1 TABLET DAILY, Disp: 90 tablet, Rfl: 3   metFORMIN (GLUCOPHAGE-XR) 750 MG 24 hr tablet, TAKE 1 TABLET DAILY, Disp: 90 tablet, Rfl: 3   metoprolol tartrate (LOPRESSOR) 100 MG tablet, TAKE 1 TABLET THREE TIMES A DAY, Disp: 270 tablet, Rfl: 3   Multiple Vitamin (MULITIVITAMIN WITH MINERALS) TABS, Take 1 tablet by mouth daily., Disp: , Rfl:    NON FORMULARY, at bedtime. CPAP, Disp: , Rfl:    pravastatin (PRAVACHOL) 80 MG tablet, TAKE 1 TABLET DAILY, Disp: 90 tablet, Rfl: 3   TRULICITY 7.58 IT/2.5QD SOPN, INJECT 0.75 MG UNDER THE SKIN ONCE A WEEK, Disp: 6 mL, Rfl: 3   warfarin (COUMADIN) 4 MG tablet, TAKE ONE TO ONE AND ONE-HALF TABLETS DAILY AS DIRECTED PER COUMADIN CLINIC, Disp: 135 tablet, Rfl: 3   No Known Allergies   Review of Systems  Constitutional: Negative.   Respiratory: Negative.    Cardiovascular: Negative.  Negative for chest pain and palpitations.  Gastrointestinal: Negative.   Neurological: Negative.  Negative for dizziness and headaches.  Psychiatric/Behavioral: Negative.       Today's Vitals   09/25/21 1552  BP: 130/70  Pulse: 67  Temp: 98.3 F (36.8 C)  TempSrc: Oral  Weight: 298 lb (135.2 kg)  Height: 5' 10"  (1.778 m)   Body mass index is 42.76 kg/m.  Wt Readings from Last 3  Encounters:  09/25/21 298 lb (135.2 kg)  05/23/21 (!) 301 lb 9.6 oz (136.8 kg)  02/15/21 (!) 302 lb (137 kg)    Objective:  Physical Exam Vitals reviewed.  Constitutional:      General: He is not in acute distress.    Appearance: Normal appearance. He is obese.     Comments: Central obesity  Cardiovascular:     Rate and Rhythm: Normal rate and regular rhythm.     Pulses: Normal pulses.     Heart sounds: Normal heart sounds. No murmur heard. Pulmonary:     Effort: Pulmonary effort is normal. No respiratory distress.     Breath sounds: Normal breath sounds. No wheezing.  Musculoskeletal:        General: No swelling.     Right lower leg: Edema (trace edema - much improved with his wraps) present.     Left lower  leg: Edema (trace edema - wearing wraps) present.     Comments: Using a cane to ambulate. has a few cratered areas. He is wearing compressive garments to lower extremities  Skin:    General: Skin is warm and dry.     Capillary Refill: Capillary refill takes less than 2 seconds.     Findings: No rash.     Comments: Lower extremities are reddened with scaly skin, firm to touch  Neurological:     General: No focal deficit present.     Mental Status: He is alert and oriented to person, place, and time.     Cranial Nerves: No cranial nerve deficit.     Motor: No weakness.  Psychiatric:        Mood and Affect: Mood normal.        Behavior: Behavior normal.        Thought Content: Thought content normal.        Judgment: Judgment normal.         Assessment And Plan:     1. Type 2 diabetes mellitus without complication, without long-term current use of insulin (HCC) Comments: HgbA1c is stable, continue medications, tolerating well.  - Hemoglobin A1c - BMP8+EGFR  2. Essential hypertension Comments: Blood pressure is controlled, continue current medications - Lipid panel  3. Mixed hyperlipidemia Comments: Cholesterol levels are stable, continue statin tolerating  well.  - Lipid panel  4. Class 3 severe obesity due to excess calories with serious comorbidity and body mass index (BMI) of 40.0 to 44.9 in adult [facility]) he is encouraged to strive for BMI less than 30 to decrease cardiac risk. Advised to aim for at least 150 minutes of exercise per week.  5. Persistent atrial fibrillation (HCC) Comments: Continue f/u at Coumadin clinic  6. Need for influenza vaccination Influenza vaccine administered Encouraged to take Tylenol as needed for fever or muscle aches. - Flu Vaccine QUAD High Dose(Fluad) He is encouraged to initially strive for BMI less than 30 to decrease    Patient was given opportunity to ask questions. Patient verbalized understanding of the plan and was able to repeat key elements of the plan. All questions were answered to their satisfaction.  [name], FNP    I, [name], FNP, have reviewed all documentation for this visit. The documentation on 09/25/21 for the exam, diagnosis, procedures, and orders are all accurate and complete.   IF YOU HAVE BEEN REFERRED TO A SPECIALIST, IT MAY TAKE 1-2 WEEKS TO SCHEDULE/PROCESS THE REFERRAL. IF YOU HAVE NOT HEARD FROM US/SPECIALIST IN TWO WEEKS, PLEASE GIVE [place] A CALL AT [phone] X [phone].   THE PATIENT IS ENCOURAGED TO PRACTICE SOCIAL DISTANCING DUE TO THE COVID-19 PANDEMIC.

## 2021-09-26 LAB — LIPID PANEL
Chol/HDL Ratio: 4.4 ratio (ref 0.0–5.0)
Cholesterol, Total: 109 mg/dL (ref 100–199)
HDL: 25 mg/dL — ABNORMAL LOW (ref >39)
LDL Chol Calc (NIH): 56 mg/dL (ref 0–99)
Triglycerides: 160 mg/dL — ABNORMAL HIGH (ref 0–149)
VLDL Cholesterol Cal: 28 mg/dL (ref 5–40)

## 2021-09-26 LAB — BMP8+EGFR
BUN/Creatinine Ratio: 17 (ref 10–24)
BUN: 19 mg/dL (ref 8–27)
CO2: 26 mmol/L (ref 20–29)
Calcium: 9.8 mg/dL (ref 8.6–10.2)
Chloride: 102 mmol/L (ref 96–106)
Creatinine, Ser: 1.1 mg/dL (ref 0.76–1.27)
Glucose: 96 mg/dL (ref 70–99)
Potassium: 5 mmol/L (ref 3.5–5.2)
Sodium: 141 mmol/L (ref 134–144)
eGFR: 70 mL/min/{1.73_m2} (ref >59)

## 2021-09-26 LAB — HEMOGLOBIN A1C
Est. average glucose Bld gHb Est-mCnc: 126 mg/dL
Hgb A1c MFr Bld: 6 % — ABNORMAL HIGH (ref 4.8–5.6)

## 2021-09-28 ENCOUNTER — Other Ambulatory Visit: Payer: Self-pay | Admitting: Cardiology

## 2021-09-28 NOTE — Telephone Encounter (Signed)
Prescription refill request received for warfarin Lov: 10/14/2015 Ellyn Hack) Next INR check: 10/18/21 Warfarin tablet strength: '4mg'$   Pt has not seen cardiologist since 2017. Called pt and left message.

## 2021-09-29 ENCOUNTER — Telehealth: Payer: Self-pay | Admitting: Pharmacist

## 2021-09-29 NOTE — Telephone Encounter (Signed)
Please call to schedule for office visit

## 2021-09-29 NOTE — Telephone Encounter (Signed)
-----   Message from Leonie Man, MD sent at 09/28/2021 11:59 PM EDT ----- Regarding: RE: anticoagulation patient Yes - he should be seen by me or APP.  DH ----- Message ----- From: Rollen Sox, [facility] Sent: 09/28/2021   8:00 AM EDT To: Leonie Man, MD; Raiford Simmonds, RN Subject: anticoagulation patient                        Hi Dr Ellyn Hack and Ivin Booty,  The Coumadin nurses just brought up to me that this patient is compliant with INR checks, however he has not been seen by you since 2017.  Is there anything we need to know about?  Does he need to be scheduled?

## 2021-10-02 NOTE — Telephone Encounter (Signed)
Called and spoke with pt concerning overdue office visit. Pt stated he was willing to schedule an appt and did not think he needed a Wafarin refill at this time. Transferred pt to scheduling to make appt and placed note on next Coumadin Clinic appt to confirm pt has scheduled an appt.

## 2021-10-10 NOTE — Telephone Encounter (Signed)
10/10/21 LVM to schedule overdue f/u with Dr. Ellyn Hack or an APP, this will be an OV - LCN

## 2021-10-16 ENCOUNTER — Other Ambulatory Visit: Payer: Self-pay | Admitting: Nurse Practitioner

## 2021-10-18 ENCOUNTER — Ambulatory Visit: Payer: Medicare Other | Attending: Cardiology | Admitting: Student

## 2021-10-18 DIAGNOSIS — I4819 Other persistent atrial fibrillation: Secondary | ICD-10-CM | POA: Diagnosis not present

## 2021-10-18 DIAGNOSIS — Z7901 Long term (current) use of anticoagulants: Secondary | ICD-10-CM | POA: Diagnosis not present

## 2021-10-18 LAB — POCT INR: INR: 2.9 (ref 2.0–3.0)

## 2021-10-18 NOTE — Patient Instructions (Signed)
Continue taking 1.5 tablets daily except 1 tablet on Sunday. Repeat INR in 8 weeks.  [phone]

## 2021-11-17 ENCOUNTER — Ambulatory Visit: Payer: Medicare Other | Admitting: Cardiology

## 2021-11-29 ENCOUNTER — Ambulatory Visit: Payer: Medicare Other | Attending: Cardiology | Admitting: Cardiology

## 2021-11-29 ENCOUNTER — Encounter: Payer: Self-pay | Admitting: Cardiology

## 2021-11-29 VITALS — BP 108/58 | HR 57 | Ht 71.0 in | Wt 298.4 lb

## 2021-11-29 DIAGNOSIS — R600 Localized edema: Secondary | ICD-10-CM | POA: Diagnosis not present

## 2021-11-29 DIAGNOSIS — E785 Hyperlipidemia, unspecified: Secondary | ICD-10-CM | POA: Diagnosis not present

## 2021-11-29 DIAGNOSIS — G4733 Obstructive sleep apnea (adult) (pediatric): Secondary | ICD-10-CM | POA: Diagnosis not present

## 2021-11-29 DIAGNOSIS — Z7901 Long term (current) use of anticoagulants: Secondary | ICD-10-CM | POA: Diagnosis not present

## 2021-11-29 DIAGNOSIS — I4819 Other persistent atrial fibrillation: Secondary | ICD-10-CM

## 2021-11-29 DIAGNOSIS — I10 Essential (primary) hypertension: Secondary | ICD-10-CM

## 2021-11-29 DIAGNOSIS — I4821 Permanent atrial fibrillation: Secondary | ICD-10-CM

## 2021-11-29 DIAGNOSIS — E119 Type 2 diabetes mellitus without complications: Secondary | ICD-10-CM | POA: Diagnosis not present

## 2021-11-29 NOTE — Progress Notes (Signed)
Primary Care Provider: [name], [place] Cardiologist: Glenetta Hew, MD Electrophysiologist: None  Clinic Note: Chief Complaint  Patient presents with   Reestablish Care   Atrial Fibrillation    Permanent A-fib.  Well-controlled   ===================================  ASSESSMENT/PLAN   Problem List Items Addressed This Visit       Cardiology Problems   Permanent atrial fibrillation ([place]): CHA2DS2Vasc = 3; On Warfarin - Primary (Chronic)    Pretty much now permanent A-fib disease and A-fib here today and has no sense of being in at all.  Not noticing any irregular heartbeats palpitations.  Rate is well-controlled on diltiazem plus metoprolol.  He remains on warfarin for anticoagulation since he had TTP with DOAC (may have been related to sepsis not sure) Maintaining stable INR.  This is actually how he was rescheduled back to see [place] since he had not been seen in years post still having his INR checked.  Plan:  Continue diltiazem to 40 mg and Toprol 100 mg TID for rate control.  I think is pretty adequately controlled we may be able back off to twice daily in future. Continue warfarin..      Relevant Medications   lisinopril-hydrochlorothiazide (ZESTORETIC) 20-25 MG tablet   aspirin EC 81 MG tablet   amLODipine (NORVASC) 10 MG tablet   Other Relevant Orders   EKG 12-Lead (Completed)   Essential hypertension (Chronic)    BP pretty well-controlled. He is on multiple medications including 10 mg of amlodipine, 240 mg diltiazem, 50 mg twice daily hydralazine, lisinopril-HCTZ 20-25 mg daily & metoprolol 100 mg TID  -> consider reducing BB to BID in f/u      Relevant Medications   lisinopril-hydrochlorothiazide (ZESTORETIC) 20-25 MG tablet   aspirin EC 81 MG tablet   amLODipine (NORVASC) 10 MG tablet     Other   Long term current use of anticoagulant therapy (Chronic)    On warfarin, followed by the anticoagulation clinic.  Unfortunately, with TTP in  response to DOAC, reluctant to retry new DOAC.  Would be okay to hold warfarin for procedures or surgeries without bridging.      Dyslipidemia (high LDL; low HDL) (Chronic)    Well controlled lipids on current dose of statin.  (On pravastatin because of issues with other statins) Labs are followed by PCP.      Type 2 diabetes mellitus without complication, without long-term current use of insulin (HCC)   Relevant Medications   lisinopril-hydrochlorothiazide (ZESTORETIC) 20-25 MG tablet   aspirin EC 81 MG tablet   OSA (obstructive sleep apnea)   Bilateral leg edema (Chronic)    Well-controlled, related to venous stasis.    Plan: Continue on lower dose of Lasix now.  Continue to elevate feet and wear support stockings.       ===================================  HPI:    Brett [name] is a 76 y.o. male with a PMH notable for mostly Permanent A-Fib, and history of DVT (on warfarin -not on DOAC because of history of TTP thought to be related to either Pradaxa or Multaq), HTN, HLD, OSA on CPAP who presents to reestablish cardiology care at the request of Brett [name], [place] was last seen on October 14, 2015 -> he was doing relatively well with no major issues.  No resting or exertional dyspnea.  Some mild right lower extremity edema for which he was wearing support hose and occasionally using Lasix.  Usually would walk with a shopping cart to give himself  some support.  Hip and back pain. >  In addition to his warfarin he is on standing dose of Lasix for edema along with TID metoprolol plus diltiazem for rate control.  Recent Hospitalizations: None  His most recent Office Visit was with [name], FNP on May 23, 2021. => He had completed treatment for lower extremity edema and wound care => right lower leg wound.  [place].  Reviewed  CV studies:    The following studies were reviewed today: (if available, images/films reviewed: From Epic Chart or Care  Everywhere) ABIs 11/18/2020:: Bilateral ABIs indicate noncompressible lower extremity arteries.  Normal TBI's No other new studies.   Interval History:   Brett [name] returns here today to reestablish care.  He clearly has been doing relatively well since I last saw him and has not had many major complaints for cardiac standpoint.  He is still morbidly obese and does have exertional dyspnea especially if he overexerts himself.  He denies any dyspnea with routine activity walk around the house, or light exercise.  No chest pain or pressure with rest or exertion.  He had gained some weight since I had seen him at about 310 pounds.  Now is down to 298 here.  He has been on some type of diet and excise plan which is working relatively well but he is not sure how well he can keep up with it.  He is sleeps on 2 pillows uses CPAP therefore difficult to assess for true PND and orthopnea but does have some mild end of day swelling.  No bleeding issues.  No melena, hematochezia hematuria.  He really does not take Lasix every day, takes it sometimes maybe couple times a week.  He really denies any sensation of irregular heartbeats or palpitations.  No syncope or near syncope.  No worsening usual dyspnea.  CV Review of Symptoms (Summary): Cardiovascular ROS: positive for - dyspnea on exertion, edema, and balance is off, has poor stability.  Uses a cane for longer distance walking. negative for - chest pain, irregular heartbeat, orthopnea, palpitations, paroxysmal nocturnal dyspnea, rapid heart rate, shortness of breath, or lightheadedness or syncope/near syncope or TIA/amaurosis fugax, claudication  REVIEWED OF SYSTEMS   Review of Systems  Constitutional:  Positive for malaise/fatigue (Somewhat lethargic, getting better with increase in walking.  Somewhat deconditioned) and weight loss (Not much, but has started on downward trend.).  HENT:  Negative for congestion and nosebleeds.   Respiratory:          Per HPI  Cardiovascular:        Per HPI  Gastrointestinal:  Negative for blood in stool and melena.  Genitourinary:  Negative for hematuria.  Musculoskeletal:  Positive for joint pain. Negative for falls.  Neurological:  Positive for dizziness (Balance is off.  Some vertigo but mostly just equilibrium). Negative for focal weakness.  Psychiatric/Behavioral: Negative.    All other systems reviewed and are negative.   I have reviewed and (if needed) personally updated the patient's problem list, medications, allergies, past medical and surgical history, social and family history.   PAST MEDICAL HISTORY   Past Medical History:  Diagnosis Date   DVT of lower extremity, bilateral ([place]) 06/17/2012   Right CFV and popliteal vein, left C. it the only;  also interstitial fluid noted in both calfs   Dyslipidemia    History of: Bacteremia due to coagulase-negative Staphylococcus 06/13/2012   During hospitalization May 30 through 06/19/2012    History: TTP (thrombotic thrombocytopenic purpura)  06/11/2012   Hypertension    Obesity (BMI 30-39.9)    OSA on CPAP    Persistent atrial fibrillation ([place]) 02/09/2011   Negative Myoview; Normal EF by Echo   Severe sepsis with acute organ dysfunction ([place]) 5/30-6/12/2012   Prolonged hospitalization for presumed sepsis and septic shock compounded by coagulopathy thought to be TTP versus Pradaxa mediated. Temporarily on hemodialysis and plasmapheresis.   Type 2 diabetes mellitus without complication, without long-term current use of insulin ([place]) 09/09/2019    PAST SURGICAL HISTORY   Past Surgical History:  Procedure Laterality Date   CARDIOVERSION  06/08/2011   Procedure: CARDIOVERSION;  Surgeon: Leonie Man, MD;  Location: [facility];  Service: Cardiovascular;  Laterality: N/A;   NM MYOVIEW LTD  02/07/2011   lexiscan; mild ischemia in mid inferolateral & apical lateral regions; low risk scan   SKIN SURGERY Left    left side of face 11/2020   TOTAL  KNEE ARTHROPLASTY     Left knee 2010, Right knee 2011   TRANSTHORACIC ECHOCARDIOGRAM  02/07/2011   EF 50-60%; RV mildly dilated, LA moderately dilated; mild MR; mild-mod TR; RV systolic pressure elevated; mildly sclerotic AV    Immunization History  Administered Date(s) Administered   Fluad Quad(high Dose 65+) 09/09/2019, 01/12/2021, 09/25/2021   Influenza, High Dose Seasonal PF 11/27/2018   Influenza-Unspecified 12/08/2012   PFIZER(Purple Top)SARS-COV-2 Vaccination 02/22/2019, 03/24/2019   PNEUMOCOCCAL CONJUGATE-20 02/15/2021   Tdap 09/15/2012    MEDICATIONS/ALLERGIES   Current Meds  Medication Sig   acetaminophen (TYLENOL) 500 MG tablet Take 1,000 mg by mouth daily.    amLODipine (NORVASC) 10 MG tablet Take 0.5 tablets by mouth daily.   ammonium lactate (AMLACTIN) 12 % lotion Apply 1 application topically as needed for dry skin.   aspirin EC 81 MG tablet Take 1 tablet by mouth daily.   blood glucose meter kit and supplies KIT Dispense based on patient and insurance preference. Use up to four times daily as directed. (FOR ICD-9 250.00, 250.01).   Blood Glucose Monitoring Suppl (FREESTYLE LITE) DEVI Use to test blood sugar 3 times a day. Dx code e11.65   celecoxib (CELEBREX) 200 MG capsule Take 1 capsule by mouth daily.   diltiazem (CARDIZEM CD) 240 MG 24 hr capsule TAKE 1 CAPSULE DAILY   diphenhydramine-acetaminophen (TYLENOL PM) 25-500 MG TABS Take 2 tablets by mouth at bedtime.   fexofenadine (ALLEGRA) 180 MG tablet Take 1 tablet by mouth daily.   fish oil-omega-3 fatty acids 1000 MG capsule Take 1 g by mouth daily.   FREESTYLE LITE test strip TEST UP TO FOUR TIMES A DAY AS DIRECTED   furosemide (LASIX) 20 MG tablet Take 1 tablet (20 mg total) by mouth 2 (two) times daily.   hydrALAZINE (APRESOLINE) 50 MG tablet TAKE 1 TABLET TWICE A DAY WITH FOOD   JARDIANCE 10 MG TABS tablet TAKE 1 TABLET DAILY BEFORE BREAKFAST   Lancets (FREESTYLE) lancets TEST UP TO FOUR TIMES A DAY AS  DIRECTED   levofloxacin (LEVAQUIN) 500 MG tablet    lisinopril-hydrochlorothiazide (ZESTORETIC) 20-25 MG tablet Take 1 tablet by mouth daily.   loratadine (CLARITIN) 10 MG tablet TAKE 1 TABLET DAILY   metFORMIN (GLUCOPHAGE-XR) 750 MG 24 hr tablet TAKE 1 TABLET DAILY   metoprolol tartrate (LOPRESSOR) 100 MG tablet TAKE 1 TABLET THREE TIMES A DAY   Multiple Vitamin (MULITIVITAMIN WITH MINERALS) TABS Take 1 tablet by mouth daily.   mupirocin ointment (BACTROBAN) 2 % 1 application to affected area small amount Externally  Three times a day for 4 weeks   NON FORMULARY at bedtime. CPAP   pravastatin (PRAVACHOL) 80 MG tablet TAKE 1 TABLET DAILY   TRULICITY 3.41 PF/7.9KW SOPN INJECT 0.75 MG UNDER THE SKIN ONCE A WEEK   warfarin (COUMADIN) 4 MG tablet TAKE ONE TO ONE AND ONE-HALF TABLETS DAILY AS DIRECTED PER COUMADIN CLINIC    No Known Allergies  SOCIAL HISTORY/FAMILY HISTORY   Reviewed in Epic:  Pertinent findings:  Social History   Tobacco Use   Smoking status: Former    Types: Cigars   Smokeless tobacco: Never  Vaping Use   Vaping Use: Never used  Substance Use Topics   Alcohol use: No    Comment: couple beers a year   Drug use: No   Social History   Social History Narrative   He is a divorced father of one. Prior to this hospitalization he was walking 5-10 minutes a day 4 times a week. He now is gradually building up his rehabilitation doing mild exercise daily.      He quit smoking in 1968 and rarely has alcohol.    OBJCTIVE -PE, EKG, labs   Wt Readings from Last 3 Encounters:  11/29/21 298 lb 6.4 oz (135.4 kg)  09/25/21 298 lb (135.2 kg)  05/23/21 (!) 301 lb 9.6 oz (136.8 kg)    Physical Exam: BP (!) 108/58 (BP Location: Left Arm, Patient Position: Sitting, Cuff Size: Large)   Pulse (!) 57   Ht _0  (1.803 m)   Wt 298 lb 6.4 oz (135.4 kg)   SpO2 93%   BMI 41.62 kg/m  Physical Exam Vitals reviewed.  Constitutional:      General: He is not in acute distress.     Appearance: He is obese. He is ill-appearing (Has a mild chronic ill appearance to somewhat flushed and ruddy complexion.  Somewhat deconditioned).  HENT:     Head: Normocephalic and atraumatic.  Neck:     Vascular: No carotid bruit or JVD.  Cardiovascular:     Rate and Rhythm: Normal rate. Rhythm irregularly irregular.     Chest Wall: PMI is not displaced (Unable to palpate).     Pulses: Intact distal pulses.     Heart sounds: S2 normal. Heart sounds are distant. No murmur (1/6 SEM at RUSB) heard.    No friction rub. No gallop.  Pulmonary:     Effort: Pulmonary effort is normal. No respiratory distress.     Breath sounds: Normal breath sounds. No wheezing, rhonchi or rales.  Musculoskeletal:        General: Swelling (1-2 blocks ankles) present.     Cervical back: Normal range of motion and neck supple.  Skin:    General: Skin is warm and dry.     Coloration: Skin is not jaundiced.     Comments: Ready complexion  Neurological:     General: No focal deficit present.     Mental Status: He is alert and oriented to person, place, and time.     Motor: Weakness present.     Gait: Gait abnormal.  Psychiatric:        Mood and Affect: Mood normal.        Behavior: Behavior normal.        Thought Content: Thought content normal.        Judgment: Judgment normal.     Adult ECG Report  Rate: 57 ;  Rhythm: atrial fibrillation and with slow rate.  PVCs and aberrantly conducted beats  are noted.  Nonspecific ST-T wave changes, otherwise normal axis, intervals and durations. ;   Narrative Interpretation: Stable  Recent Labs: Reviewed Lab Results  Component Value Date   CHOL 109 09/25/2021   HDL 25 (L) 09/25/2021   LDLCALC 56 09/25/2021   TRIG 160 (H) 09/25/2021   CHOLHDL 4.4 09/25/2021   Lab Results  Component Value Date   CREATININE 1.10 09/25/2021   BUN 19 09/25/2021   NA 141 09/25/2021   K 5.0 09/25/2021   CL 102 09/25/2021   CO2 26 09/25/2021      Latest Ref Rng & Units  05/22/2018    1:07 PM 11/21/2017   12:21 PM 07/07/2012    5:50 AM  CBC  WBC 3.4 - 10.8 x10E3/uL 7.3  7.9  5.9   Hemoglobin 13.0 - 17.7 g/dL 14.6  14.9  10.4   Hematocrit 37.5 - 51.0 % 44.4  42.0  32.0   Platelets 150 - 450 x10E3/uL 223  273  261     Lab Results  Component Value Date   HGBA1C 6.0 (H) 09/25/2021   Lab Results  Component Value Date   TSH 2.460 11/27/2018    ================================================== I spent a total of 23 minutes with the patient spent in direct patient consultation.  Additional time spent with chart review  / charting (studies, outside notes, etc): 22 min Total Time: 45 min  Current medicines are reviewed at length with the patient today.  (+/- concerns) none  Notice: This dictation was prepared with Dragon dictation along with smart phrase technology. Any transcriptional errors that result from this process are unintentional and may not be corrected upon review.  Studies Ordered:   Orders Placed This Encounter  Procedures   EKG 12-Lead   No orders of the defined types were placed in this encounter.   Patient Instructions / Medication Changes & Studies & Tests Ordered   Patient Instructions  Medication Instructions:   NO CHANGES *If you need a refill on your cardiac medications before your next appointment, please call your pharmacy*   Lab Work: NOT NEEDED    Testing/Procedures:  NOT NEEDED  Follow-Up: At [facility], you and your health needs are our priority.  As part of our continuing mission to provide you with exceptional heart care, we have created designated Provider Care Teams.  These Care Teams include your primary Cardiologist (physician) and Advanced Practice Providers (APPs -  Physician Assistants and Nurse Practitioners) who all work together to provide you with the care you need, when you need it.     Your next appointment:   12 month(s)  The format for your next appointment:   In  Person  Provider:   Glenetta Hew, MD      Leonie Man, MD, MS Glenetta Hew, M.D., M.S. Interventional Cardiologist  [facility]  Pager # [phone] Phone # [phone] [address]. [place], [place] [address]   Thank you for choosing [facility]!!

## 2021-11-29 NOTE — Patient Instructions (Addendum)

## 2021-12-13 ENCOUNTER — Ambulatory Visit: Payer: Medicare Other | Attending: Cardiology

## 2021-12-13 DIAGNOSIS — Z7901 Long term (current) use of anticoagulants: Secondary | ICD-10-CM

## 2021-12-13 LAB — POCT INR: INR: 2.7 (ref 2.0–3.0)

## 2021-12-13 NOTE — Patient Instructions (Signed)
Description   Continue taking 1.5 tablets daily except 1 tablet on Sunday. Repeat INR in 8 weeks. [facility] [phone]

## 2021-12-23 ENCOUNTER — Encounter: Payer: Self-pay | Admitting: Cardiology

## 2021-12-23 NOTE — Assessment & Plan Note (Signed)
BP pretty well-controlled. He is on multiple medications including 10 mg of amlodipine, 240 mg diltiazem, 50 mg twice daily hydralazine, lisinopril-HCTZ 20-25 mg daily & metoprolol 100 mg TID  -> consider reducing BB to BID in f/u

## 2021-12-23 NOTE — Assessment & Plan Note (Signed)
Well controlled lipids on current dose of statin.  (On pravastatin because of issues with other statins) Labs are followed by PCP.

## 2021-12-23 NOTE — Assessment & Plan Note (Signed)
Well-controlled, related to venous stasis.    Plan: Continue on lower dose of Lasix now.  Continue to elevate feet and wear support stockings.

## 2021-12-23 NOTE — Assessment & Plan Note (Signed)
Pretty much now permanent A-fib disease and A-fib here today and has no sense of being in at all.  Not noticing any irregular heartbeats palpitations.  Rate is well-controlled on diltiazem plus metoprolol.  He remains on warfarin for anticoagulation since he had TTP with DOAC (may have been related to sepsis not sure) Maintaining stable INR.  This is actually how he was rescheduled back to see [place] since he had not been seen in years post still having his INR checked.  Plan:  Continue diltiazem to 40 mg and Toprol 100 mg TID for rate control.  I think is pretty adequately controlled we may be able back off to twice daily in future. Continue warfarin.Marland Kitchen

## 2021-12-23 NOTE — Assessment & Plan Note (Signed)
On warfarin, followed by the anticoagulation clinic.  Unfortunately, with TTP in response to DOAC, reluctant to retry new DOAC.  Would be okay to hold warfarin for procedures or surgeries without bridging.

## 2021-12-27 ENCOUNTER — Other Ambulatory Visit: Payer: Self-pay | Admitting: Nurse Practitioner

## 2022-01-22 ENCOUNTER — Other Ambulatory Visit: Payer: Self-pay | Admitting: Nurse Practitioner

## 2022-01-24 ENCOUNTER — Ambulatory Visit (INDEPENDENT_AMBULATORY_CARE_PROVIDER_SITE_OTHER): Payer: Medicare Other | Admitting: Nurse Practitioner

## 2022-01-24 ENCOUNTER — Ambulatory Visit (INDEPENDENT_AMBULATORY_CARE_PROVIDER_SITE_OTHER): Payer: Medicare Other

## 2022-01-24 ENCOUNTER — Encounter: Payer: Self-pay | Admitting: Nurse Practitioner

## 2022-01-24 VITALS — BP 126/70 | HR 77 | Temp 97.9°F | Ht 68.0 in | Wt 298.0 lb

## 2022-01-24 VITALS — BP 126/70 | HR 77 | Temp 97.9°F | Ht 68.0 in | Wt 298.6 lb

## 2022-01-24 DIAGNOSIS — I10 Essential (primary) hypertension: Secondary | ICD-10-CM | POA: Diagnosis not present

## 2022-01-24 DIAGNOSIS — I4821 Permanent atrial fibrillation: Secondary | ICD-10-CM | POA: Diagnosis not present

## 2022-01-24 DIAGNOSIS — E782 Mixed hyperlipidemia: Secondary | ICD-10-CM | POA: Diagnosis not present

## 2022-01-24 DIAGNOSIS — Z0000 Encounter for general adult medical examination without abnormal findings: Secondary | ICD-10-CM | POA: Diagnosis not present

## 2022-01-24 DIAGNOSIS — E119 Type 2 diabetes mellitus without complications: Secondary | ICD-10-CM | POA: Diagnosis not present

## 2022-01-24 DIAGNOSIS — E11620 Type 2 diabetes mellitus with diabetic dermatitis: Secondary | ICD-10-CM

## 2022-01-24 DIAGNOSIS — Z6841 Body Mass Index (BMI) 40.0 and over, adult: Secondary | ICD-10-CM

## 2022-01-24 LAB — BMP8+EGFR
BUN/Creatinine Ratio: 20 (ref 10–24)
BUN: 24 mg/dL (ref 8–27)
CO2: 22 mmol/L (ref 20–29)
Calcium: 9.1 mg/dL (ref 8.6–10.2)
Chloride: 102 mmol/L (ref 96–106)
Creatinine, Ser: 1.22 mg/dL (ref 0.76–1.27)
Glucose: 117 mg/dL — ABNORMAL HIGH (ref 70–99)
Potassium: 4.1 mmol/L (ref 3.5–5.2)
Sodium: 140 mmol/L (ref 134–144)
eGFR: 61 mL/min/{1.73_m2} (ref >59)

## 2022-01-24 LAB — LIPID PANEL
Chol/HDL Ratio: 3.9 ratio (ref 0.0–5.0)
Cholesterol, Total: 102 mg/dL (ref 100–199)
HDL: 26 mg/dL — ABNORMAL LOW (ref >39)
LDL Chol Calc (NIH): 52 mg/dL (ref 0–99)
Triglycerides: 135 mg/dL (ref 0–149)
VLDL Cholesterol Cal: 24 mg/dL (ref 5–40)

## 2022-01-24 LAB — HEMOGLOBIN A1C
Est. average glucose Bld gHb Est-mCnc: 128 mg/dL
Hgb A1c MFr Bld: 6.1 % — ABNORMAL HIGH (ref 4.8–5.6)

## 2022-01-24 MED ORDER — TRULICITY 1.5 MG/0.5ML ~~LOC~~ SOAJ: 1.5000 mg | SUBCUTANEOUS | 1 refills | Status: DC

## 2022-01-24 NOTE — Progress Notes (Signed)
I,Tianna Badgett,acting as a Education administrator for [employer], FNP.,have documented all relevant documentation on the behalf of [name], FNP,as directed by  [name], FNP while in the presence of [name], [place].  Subjective:     Patient ID: Brett [name] , male    DOB: [birth year] , 77 y.o.   MRN: [id number]   Chief Complaint  Patient presents with   Diabetes    HPI  Pt presents today for bp & dm f/u. He also had his AWV done today with [facility]  Wt Readings from Last 3 Encounters: 01/24/22 : 298 lb 9.6 oz (135.4 kg) 11/29/21 : 298 lb 6.4 oz (135.4 kg) 09/25/21 : 298 lb (135.2 kg)    Diabetes He presents for his follow-up diabetic visit. He has type 2 diabetes mellitus. His disease course has been stable. Pertinent negatives for hypoglycemia include no dizziness or headaches. Pertinent negatives for diabetes include no chest pain. There are no hypoglycemic complications. There are no diabetic complications. Risk factors for coronary artery disease include sedentary lifestyle, obesity, male sex, hypertension and diabetes mellitus. Current diabetic treatment includes oral agent (dual therapy). He is compliant with treatment all of the time. He is following a generally unhealthy diet. When asked about meal planning, he reported none. He has not had a previous visit with a dietitian. He rarely participates in exercise. There is no change in his home blood glucose trend. (Blood sugars (fasting) 98 - 142 (2 hours after eating)) He does not see a podiatrist. Hypertension This is a chronic problem. The current episode started more than 1 year ago. The problem has been rapidly improving since onset. The problem is uncontrolled. Pertinent negatives include no anxiety, chest pain, headaches or palpitations. There are no associated agents to hypertension. Risk factors for coronary artery disease include obesity and male gender. Past treatments include ACE inhibitors and alpha 1 blockers. The current  treatment provides moderate improvement. There are no compliance problems.  There is no history of angina or kidney disease. There is no history of chronic renal disease.     Past Medical History:  Diagnosis Date   Cellulitis of right lower extremity 05/22/2018   DVT of lower extremity, bilateral ([place]) 06/17/2012   Right CFV and popliteal vein, left C. it the only;  also interstitial fluid noted in both calfs   Dyslipidemia    History of: Bacteremia due to coagulase-negative Staphylococcus 06/13/2012   During hospitalization May 30 through 06/19/2012    History: TTP (thrombotic thrombocytopenic purpura) 06/11/2012   Hypertension    Obesity (BMI 30-39.9)    OSA on CPAP    Persistent atrial fibrillation ([place]) 02/09/2011   Negative Myoview; Normal EF by Echo   Severe sepsis with acute organ dysfunction ([place]) 5/30-6/12/2012   Prolonged hospitalization for presumed sepsis and septic shock compounded by coagulopathy thought to be TTP versus Pradaxa mediated. Temporarily on hemodialysis and plasmapheresis.   Type 2 diabetes mellitus without complication, without long-term current use of insulin ([place]) 09/09/2019     Family History  Problem Relation Age of Onset   Heart failure Mother 74   Cancer Father        stomach     Current Outpatient Medications:    Dulaglutide (TRULICITY) 1.5 NI/6.2VO SOPN, Inject 1.5 mg into the skin once a week., Disp: 6 mL, Rfl: 1   acetaminophen (TYLENOL) 500 MG tablet, Take 1,000 mg by mouth daily. , Disp: , Rfl:    amLODipine (NORVASC) 10 MG tablet, Take 0.5  tablets by mouth daily., Disp: , Rfl:    ammonium lactate (AMLACTIN) 12 % lotion, Apply 1 application topically as needed for dry skin., Disp: 400 g, Rfl: 3   aspirin EC 81 MG tablet, Take 1 tablet by mouth daily., Disp: , Rfl:    blood glucose meter kit and supplies KIT, Dispense based on patient and insurance preference. Use up to four times daily as directed. (FOR ICD-9 250.00, 250.01)., Disp: 1 each,  Rfl: 0   Blood Glucose Monitoring Suppl (FREESTYLE LITE) DEVI, Use to test blood sugar 3 times a day. Dx code e11.65, Disp: 1 each, Rfl: 3   celecoxib (CELEBREX) 200 MG capsule, Take 1 capsule by mouth daily., Disp: , Rfl:    diltiazem (CARDIZEM CD) 240 MG 24 hr capsule, TAKE 1 CAPSULE DAILY, Disp: 90 capsule, Rfl: 3   diphenhydramine-acetaminophen (TYLENOL PM) 25-500 MG TABS, Take 2 tablets by mouth at bedtime., Disp: , Rfl:    fexofenadine (ALLEGRA) 180 MG tablet, Take 1 tablet by mouth daily., Disp: , Rfl:    fish oil-omega-3 fatty acids 1000 MG capsule, Take 1 g by mouth daily., Disp: , Rfl:    FREESTYLE LITE test strip, TEST UP TO FOUR TIMES A DAY AS DIRECTED, Disp: 300 strip, Rfl: 3   furosemide (LASIX) 20 MG tablet, Take 1 tablet (20 mg total) by mouth 2 (two) times daily., Disp: 180 tablet, Rfl: 2   hydrALAZINE (APRESOLINE) 50 MG tablet, TAKE 1 TABLET TWICE A DAY WITH FOOD, Disp: 180 tablet, Rfl: 3   JARDIANCE 10 MG TABS tablet, TAKE 1 TABLET DAILY BEFORE BREAKFAST, Disp: 90 tablet, Rfl: 3   Lancets (FREESTYLE) lancets, TEST UP TO FOUR TIMES A DAY AS DIRECTED, Disp: 300 each, Rfl: 3   levofloxacin (LEVAQUIN) 500 MG tablet, , Disp: , Rfl:    lisinopril-hydrochlorothiazide (ZESTORETIC) 20-25 MG tablet, Take 1 tablet by mouth daily., Disp: , Rfl:    loratadine (CLARITIN) 10 MG tablet, TAKE 1 TABLET DAILY, Disp: 90 tablet, Rfl: 3   metFORMIN (GLUCOPHAGE-XR) 750 MG 24 hr tablet, TAKE 1 TABLET DAILY, Disp: 90 tablet, Rfl: 3   metoprolol tartrate (LOPRESSOR) 100 MG tablet, TAKE 1 TABLET THREE TIMES A DAY, Disp: 270 tablet, Rfl: 3   Multiple Vitamin (MULITIVITAMIN WITH MINERALS) TABS, Take 1 tablet by mouth daily., Disp: , Rfl:    mupirocin ointment (BACTROBAN) 2 %, 1 application to affected area small amount Externally Three times a day for 4 weeks, Disp: , Rfl:    NON FORMULARY, at bedtime. CPAP, Disp: , Rfl:    pravastatin (PRAVACHOL) 80 MG tablet, TAKE 1 TABLET DAILY, Disp: 90 tablet, Rfl:  3   warfarin (COUMADIN) 4 MG tablet, TAKE ONE TO ONE AND ONE-HALF TABLETS DAILY AS DIRECTED PER COUMADIN CLINIC, Disp: 135 tablet, Rfl: 3   No Known Allergies   Review of Systems  Constitutional: Negative.   Respiratory: Negative.    Cardiovascular: Negative.  Negative for chest pain and palpitations.  Gastrointestinal: Negative.   Neurological: Negative.  Negative for dizziness and headaches.     There were no vitals filed for this visit. There is no height or weight on file to calculate BMI.   Objective:  Physical Exam Vitals reviewed.  Constitutional:      General: He is not in acute distress.    Appearance: Normal appearance. He is obese.     Comments: Central obesity  Cardiovascular:     Rate and Rhythm: Normal rate and regular rhythm.  Pulses: Normal pulses.     Heart sounds: Normal heart sounds. No murmur heard. Pulmonary:     Effort: Pulmonary effort is normal. No respiratory distress.     Breath sounds: Normal breath sounds. No wheezing.  Musculoskeletal:        General: No swelling.     Right lower leg: Edema (trace edema - much improved with his wraps) present.     Left lower leg: Edema (trace edema - wearing wraps) present.     Comments: Using a cane to ambulate. has a few cratered areas. He is wearing compressive garments to lower extremities  Skin:    General: Skin is warm and dry.     Capillary Refill: Capillary refill takes less than 2 seconds.     Findings: No rash.     Comments: Lower extremities are reddened with scaly skin, firm to touch  Neurological:     General: No focal deficit present.     Mental Status: He is alert and oriented to person, place, and time.     Cranial Nerves: No cranial nerve deficit.     Motor: No weakness.  Psychiatric:        Mood and Affect: Mood normal.        Behavior: Behavior normal.        Thought Content: Thought content normal.        Judgment: Judgment normal.         Assessment And Plan:     1. Type 2  diabetes mellitus with diabetic dermatitis, without long-term current use of insulin (HCC) Comments: HgbA1c has been stable at 6.0 will increase his Trulicity to 1.5 mg weekly - BMP8+EGFR - Hemoglobin A1c - Dulaglutide (TRULICITY) 1.5 VZ/5.6LO SOPN; Inject 1.5 mg into the skin once a week.  Dispense: 6 mL; Refill: 1  2. Essential hypertension Comments: Blood pressure is well controlled, continue current medications - BMP8+EGFR  3. Mixed hyperlipidemia Comments: Cholesterol levels have been stable, continue statin. Tolerating well. - BMP8+EGFR - Lipid panel  4. Permanent atrial fibrillation ([place]) Comments: Continue f/u with Cardiology  5. Severe obesity (BMI >= 40) (HCC)  He is encouraged to strive for BMI less than 30 to decrease cardiac risk. Advised to aim for at least 150 minutes of exercise per week.    Patient was given opportunity to ask questions. Patient verbalized understanding of the plan and was able to repeat key elements of the plan. All questions were answered to their satisfaction.  [name], FNP   I, [name], FNP, have reviewed all documentation for this visit. The documentation on 01/24/22 for the exam, diagnosis, procedures, and orders are all accurate and complete.   IF YOU HAVE BEEN REFERRED TO A SPECIALIST, IT MAY TAKE 1-2 WEEKS TO SCHEDULE/PROCESS THE REFERRAL. IF YOU HAVE NOT HEARD FROM US/SPECIALIST IN TWO WEEKS, PLEASE GIVE [place] A CALL AT [phone].   THE PATIENT IS ENCOURAGED TO PRACTICE SOCIAL DISTANCING DUE TO THE COVID-19 PANDEMIC.

## 2022-01-24 NOTE — Patient Instructions (Signed)

## 2022-01-24 NOTE — Progress Notes (Signed)
Subjective:   Brett [name] is a 77 y.o. male who presents for Medicare Annual/Subsequent preventive examination.  Review of Systems     Cardiac Risk Factors include: advanced age (>18mn, >>35women);diabetes mellitus;dyslipidemia;hypertension;male gender;obesity (BMI >30kg/m2)     Objective:    Today's Vitals   01/24/22 0950  BP: 126/70  Pulse: 77  Temp: 97.9 F (36.6 C)  TempSrc: Oral  Weight: 298 lb 9.6 oz (135.4 kg)  Height: '5\' 8"'$  (1.727 m)   Body mass index is 45.4 kg/m.     01/24/2022    9:56 AM 01/12/2021    8:38 AM 01/13/2020    8:44 AM 11/27/2018   10:54 AM 11/21/2017   11:35 AM 06/11/2012   11:00 AM 06/08/2011   10:26 AM  Advanced Directives  Does Patient Have a Medical Advance Directive? No Yes Yes Yes No Patient does not have advance directive Patient does not have advance directive  Type of Advance Directive  Healthcare [place] of [place]     [place] of [place]?  No - copy requested No - copy requested No - copy requested     Would patient like information on creating a medical advance directive? No - Patient declined    Yes (MAU/Ambulatory/Procedural Areas - Information given)    Pre-existing out of facility DNR order (yellow form or pink MOST form)       No    Current Medications (verified) Outpatient Encounter Medications as of 01/24/2022  Medication Sig   acetaminophen (TYLENOL) 500 MG tablet Take 1,000 mg by mouth daily.    amLODipine (NORVASC) 10 MG tablet Take 0.5 tablets by mouth daily.   ammonium lactate (AMLACTIN) 12 % lotion Apply 1 application topically as needed for dry skin.   blood glucose meter kit and supplies KIT Dispense based on patient and insurance preference. Use up to four times daily as directed. (FOR ICD-9 250.00, 250.01).   Blood Glucose Monitoring Suppl (FREESTYLE LITE) DEVI Use to test blood sugar 3 times a day. Dx code  e11.65   celecoxib (CELEBREX) 200 MG capsule Take 1 capsule by mouth daily.   diltiazem (CARDIZEM CD) 240 MG 24 hr capsule TAKE 1 CAPSULE DAILY   diphenhydramine-acetaminophen (TYLENOL PM) 25-500 MG TABS Take 2 tablets by mouth at bedtime.   fexofenadine (ALLEGRA) 180 MG tablet Take 1 tablet by mouth daily.   fish oil-omega-3 fatty acids 1000 MG capsule Take 1 g by mouth daily.   FREESTYLE LITE test strip TEST UP TO FOUR TIMES A DAY AS DIRECTED   furosemide (LASIX) 20 MG tablet Take 1 tablet (20 mg total) by mouth 2 (two) times daily.   hydrALAZINE (APRESOLINE) 50 MG tablet TAKE 1 TABLET TWICE A DAY WITH FOOD   JARDIANCE 10 MG TABS tablet TAKE 1 TABLET DAILY BEFORE BREAKFAST   Lancets (FREESTYLE) lancets TEST UP TO FOUR TIMES A DAY AS DIRECTED   lisinopril-hydrochlorothiazide (ZESTORETIC) 20-25 MG tablet Take 1 tablet by mouth daily.   loratadine (CLARITIN) 10 MG tablet TAKE 1 TABLET DAILY   metFORMIN (GLUCOPHAGE-XR) 750 MG 24 hr tablet TAKE 1 TABLET DAILY   metoprolol tartrate (LOPRESSOR) 100 MG tablet TAKE 1 TABLET THREE TIMES A DAY   Multiple Vitamin (MULITIVITAMIN WITH MINERALS) TABS Take 1 tablet by mouth daily.   mupirocin ointment (BACTROBAN) 2 % 1 application to affected area small amount Externally Three times a day for 4 weeks  NON FORMULARY at bedtime. CPAP   pravastatin (PRAVACHOL) 80 MG tablet TAKE 1 TABLET DAILY   TRULICITY 6.46 OE/3.2ZY SOPN INJECT 0.75 MG UNDER THE SKIN ONCE A WEEK   warfarin (COUMADIN) 4 MG tablet TAKE ONE TO ONE AND ONE-HALF TABLETS DAILY AS DIRECTED PER COUMADIN CLINIC   aspirin EC 81 MG tablet Take 1 tablet by mouth daily.   levofloxacin (LEVAQUIN) 500 MG tablet  (Patient not taking: Reported on 01/24/2022)   No facility-administered encounter medications on file as of 01/24/2022.    Allergies (verified) Patient has no known allergies.   History: Past Medical History:  Diagnosis Date   DVT of lower extremity, bilateral ([place]) 06/17/2012   Right CFV  and popliteal vein, left C. it the only;  also interstitial fluid noted in both calfs   Dyslipidemia    History of: Bacteremia due to coagulase-negative Staphylococcus 06/13/2012   During hospitalization May 30 through 06/19/2012    History: TTP (thrombotic thrombocytopenic purpura) 06/11/2012   Hypertension    Obesity (BMI 30-39.9)    OSA on CPAP    Persistent atrial fibrillation ([place]) 02/09/2011   Negative Myoview; Normal EF by Echo   Severe sepsis with acute organ dysfunction ([place]) 5/30-6/12/2012   Prolonged hospitalization for presumed sepsis and septic shock compounded by coagulopathy thought to be TTP versus Pradaxa mediated. Temporarily on hemodialysis and plasmapheresis.   Type 2 diabetes mellitus without complication, without long-term current use of insulin ([place]) 09/09/2019   Past Surgical History:  Procedure Laterality Date   CARDIOVERSION  06/08/2011   Procedure: CARDIOVERSION;  Surgeon: Leonie Man, MD;  Location: [facility];  Service: Cardiovascular;  Laterality: N/A;   NM MYOVIEW LTD  02/07/2011   lexiscan; mild ischemia in mid inferolateral & apical lateral regions; low risk scan   SKIN SURGERY Left    left side of face 11/2020   TOTAL KNEE ARTHROPLASTY     Left knee 2010, Right knee 2011   TRANSTHORACIC ECHOCARDIOGRAM  02/07/2011   EF 50-60%; RV mildly dilated, LA moderately dilated; mild MR; mild-mod TR; RV systolic pressure elevated; mildly sclerotic AV   Family History  Problem Relation Age of Onset   Heart failure Mother 32   Cancer Father        stomach   Social History   Socioeconomic History   Marital status: Divorced    Spouse name: Not on file   Number of children: 1   Years of education: Not on file   Highest education level: Not on file  Occupational History   Occupation: retired  Tobacco Use   Smoking status: Former    Types: Cigars   Smokeless tobacco: Never  Scientific laboratory technician Use: Never used  Substance and Sexual Activity   Alcohol  use: No    Comment: couple beers a year   Drug use: No   Sexual activity: Not Currently  Other Topics Concern   Not on file  Social History Narrative   He is a divorced father of one. Prior to this hospitalization he was walking 5-10 minutes a day 4 times a week. He now is gradually building up his rehabilitation doing mild exercise daily.      He quit smoking in 1968 and rarely has alcohol.   Social Determinants of Health   Financial Resource Strain: Low Risk  (01/24/2022)   Overall Financial Resource Strain (CARDIA)    Difficulty of Paying Living Expenses: Not hard at all  Food Insecurity: No Food Insecurity (  01/24/2022)   Hunger Vital Sign    Worried About Running Out of Food in the Last Year: Never true    [place] in the Last Year: Never true  Transportation Needs: No Transportation Needs (01/24/2022)   PRAPARE - Hydrologist (Medical): No    Lack of Transportation (Non-Medical): No  Physical Activity: Inactive (01/24/2022)   Exercise Vital Sign    Days of Exercise per Week: 0 days    Minutes of Exercise per Session: 0 min  Stress: No Stress Concern Present (01/24/2022)   [place]    Feeling of Stress : Not at all  Social Connections: Not on file    Tobacco Counseling Counseling given: Not Answered   Clinical Intake:  Pre-visit preparation completed: Yes  Pain : No/denies pain     Nutritional Status: BMI > 30  Obese Nutritional Risks: None Diabetes: Yes  How often do you need to have someone help you when you read instructions, pamphlets, or other written materials from your doctor or pharmacy?: 1 - Never  Diabetic? Yes Nutrition Risk Assessment:  Has the patient had any N/V/D within the last 2 months?  No  Does the patient have any non-healing wounds?  No  Has the patient had any unintentional weight loss or weight gain?  No   Diabetes:  Is the patient  diabetic?  Yes  If diabetic, was a CBG obtained today?  No  Did the patient bring in their glucometer from home?  No  How often do you monitor your CBG's? 3-4 weekly.   Financial Strains and Diabetes Management:  Are you having any financial strains with the device, your supplies or your medication? No .  Does the patient want to be seen by Chronic Care Management for management of their diabetes?  No  Would the patient like to be referred to a Nutritionist or for Diabetic Management?  No   Diabetic Exams:  Diabetic Eye Exam: Completed 04/26/2021 Diabetic Foot Exam: Completed 02/15/2021   Interpreter Needed?: No  Information entered by :: NAllen LPN   Activities of Daily Living    01/24/2022    9:56 AM  In your present state of health, do you have any difficulty performing the following activities:  Hearing? 0  Vision? 0  Difficulty concentrating or making decisions? 0  Walking or climbing stairs? 0  Dressing or bathing? 0  Doing errands, shopping? 0  Preparing Food and eating ? N  Using the Toilet? N  In the past six months, have you accidently leaked urine? N  Do you have problems with loss of bowel control? N  Managing your Medications? N  Managing your Finances? N  Housekeeping or managing your Housekeeping? N    Patient Care Team: [name], FNP as PCP - General ([place]) Leonie Man, MD as PCP - Cardiology (Cardiology) Suella Broad, FNP as Nurse Practitioner (Psychiatry)  Indicate any recent Medical Services you may have received from other than [facility] providers in the past year (date may be approximate).     Assessment:   This is a routine wellness examination for Cam[place].  Hearing/Vision screen Vision Screening - Comments:: Regular eye exams, Dr. Katy Fitch  Dietary issues and exercise activities discussed: Current Exercise Habits: The patient does not participate in regular exercise at present   Goals Addressed             This  Visit's  Progress    Patient Stated       01/24/2022, stay alive       Depression Screen    01/24/2022    9:56 AM 09/25/2021    3:43 PM 01/12/2021    8:39 AM 01/13/2020    8:46 AM 05/28/2019   11:23 AM 11/27/2018   10:55 AM 05/22/2018   10:44 AM  PHQ 2/9 Scores  PHQ - 2 Score 0 0 0 0 0 0 0  PHQ- 9 Score      0     Fall Risk    01/24/2022    9:56 AM 09/25/2021    3:43 PM 01/12/2021    8:38 AM 07/12/2020    2:07 PM 05/28/2019   11:23 AM  Fall Risk   Falls in the past year? 0 0 0 0 0  Number falls in past yr: 0 0  0 0  Injury with Fall? 0 0  0 0  Risk for fall due to : Medication side effect;Impaired mobility;Impaired balance/gait No Fall Risks Medication side effect;Impaired balance/gait    Follow up Falls prevention discussed;Education provided;Falls evaluation completed Falls evaluation completed Falls evaluation completed;Education provided;Falls prevention discussed      FALL RISK PREVENTION PERTAINING TO THE HOME:  Any stairs in or around the home? Yes  If so, are there any without handrails? No  Home free of loose throw rugs in walkways, pet beds, electrical cords, etc? Yes  Adequate lighting in your home to reduce risk of falls? Yes   ASSISTIVE DEVICES UTILIZED TO PREVENT FALLS:  Life alert? No  Use of a cane, walker or w/c? Yes  Grab bars in the bathroom? Yes  Shower chair or bench in shower? No  Elevated toilet seat or a handicapped toilet? No   TIMED UP AND GO:  Was the test performed? Yes .  Length of time to ambulate 10 feet: 7 sec.  Gait slow and steady with assistive device  Cognitive Function:        01/24/2022    9:57 AM 01/12/2021    8:40 AM 01/13/2020    8:47 AM 11/27/2018   10:57 AM 11/21/2017   11:39 AM  6CIT Screen  What Year? 0 points 0 points 0 points 0 points 0 points  What month? 0 points 0 points 0 points 0 points 0 points  What time? 0 points 0 points 0 points 0 points 0 points  Count back from 20 0 points 0 points 0 points 0 points 0 points   Months in reverse 2 points 0 points 0 points 0 points 0 points  Repeat phrase 0 points 2 points 2 points 4 points 0 points  Total Score 2 points 2 points 2 points 4 points 0 points    Immunizations Immunization History  Administered Date(s) Administered   Fluad Quad(high Dose 65+) 09/09/2019, 01/12/2021, 09/25/2021   Influenza, High Dose Seasonal PF 11/27/2018   Influenza-Unspecified 12/08/2012   PFIZER(Purple Top)SARS-COV-2 Vaccination 02/22/2019, 03/24/2019   PNEUMOCOCCAL CONJUGATE-20 02/15/2021   Tdap 09/15/2012    TDAP status: Up to date  Flu Vaccine status: Up to date  Pneumococcal vaccine status: Up to date  Covid-19 vaccine status: Completed vaccines  Qualifies for Shingles Vaccine? Yes   Zostavax completed No   Shingrix Completed?: No.    Education has been provided regarding the importance of this vaccine. Patient has been advised to call insurance company to determine out of pocket expense if they have not yet received this vaccine. Advised may  also receive vaccine at local pharmacy or Health Dept. Verbalized acceptance and understanding.  Screening Tests Health Maintenance  Topic Date Due   Zoster Vaccines- Shingrix (1 of 2) Never done   COVID-19 Vaccine (3 - 2023-24 season) 09/08/2021   Medicare Annual Wellness (AWV)  01/12/2022   Diabetic kidney evaluation - Urine ACR  02/16/2022   FOOT EXAM  02/15/2022   HEMOGLOBIN A1C  03/26/2022   OPHTHALMOLOGY EXAM  04/27/2022   DTaP/Tdap/Td (2 - Td or Tdap) 09/16/2022   Diabetic kidney evaluation - eGFR measurement  09/26/2022   Pneumonia Vaccine 21+ Years old  Completed   INFLUENZA VACCINE  Completed   Hepatitis C Screening  Completed   HPV VACCINES  Aged Out   COLONOSCOPY (Pts 45-49yr Insurance coverage will need to be confirmed)  Discontinued    Health Maintenance  Health Maintenance Due  Topic Date Due   Zoster Vaccines- Shingrix (1 of 2) Never done   COVID-19 Vaccine (3 - 2023-24 season) 09/08/2021    Medicare Annual Wellness (AWV)  01/12/2022   Diabetic kidney evaluation - Urine ACR  02/16/2022    Colorectal cancer screening: No longer required.   Lung Cancer Screening: (Low Dose CT Chest recommended if Age 78-80years, 30 pack-year currently smoking OR have quit w/in 15years.) does not qualify.   Lung Cancer Screening Referral: no  Additional Screening:  Hepatitis C Screening: does qualify; Completed 05/22/2018  Vision Screening: Recommended annual ophthalmology exams for early detection of glaucoma and other disorders of the eye. Is the patient up to date with their annual eye exam?  Yes  Who is the provider or what is the name of the office in which the patient [facility]ends [facility] exams[facility] If pt is not established with a provider, would they like to be referred to a provider to establish care? No .   Dental Screening: Recommended annual dental exams for proper oral hygiene  Community Resource Referral / Chronic Care Management: CRR required this visit?  No   CCM required this visit?  No      Plan:     I have personally reviewed and noted the following in the patient's chart:   Medical and social history Use of alcohol, tobacco or illicit drugs  Current medications and supplements including opioid prescriptions. Patient is not currently taking opioid prescriptions. Functional ability and status Nutritional status Physical activity Advanced directives List of other physicians Hospitalizations, surgeries, and ER visits in previous 12 months Vitals Screenings to include cognitive, depression, and falls Referrals and appointments  In addition, I have reviewed and discussed with patient certain preventive protocols, quality metrics, and best practice recommendations. A written personalized care plan for preventive services as well as general preventive health recommendations were provided to patient.     NKellie Simmering LPN   [id number]  Nurse Notes:  none

## 2022-01-24 NOTE — Patient Instructions (Signed)
Brett [name] , Thank you for taking time to come for your Medicare Wellness Visit. I appreciate your ongoing commitment to your health goals. Please review the following plan we discussed and let me know if I can assist you in the future.   These are the goals we discussed:  Goals       DIET - INCREASE WATER INTAKE (pt-stated)      Decrease salt, lose weight      Patient Stated      11/27/2018, wants to avoid covid-19      Patient Stated      01/13/2020, stay alive      Patient Stated      01/12/2021, continue on      Patient Stated      01/24/2022, stay alive        This is a list of the screening recommended for you and due dates:  Health Maintenance  Topic Date Due   COVID-19 Vaccine (3 - 2023-24 season) 09/08/2021   Yearly kidney health urinalysis for diabetes  02/16/2022   Zoster (Shingles) Vaccine (1 of 2) 04/25/2022*   Complete foot exam   02/15/2022   Hemoglobin A1C  03/26/2022   Eye exam for diabetics  04/27/2022   DTaP/Tdap/Td vaccine (2 - Td or Tdap) 09/16/2022   Yearly kidney function blood test for diabetes  09/26/2022   Medicare Annual Wellness Visit  01/25/2023   Pneumonia Vaccine  Completed   Flu Shot  Completed   Hepatitis C Screening: USPSTF Recommendation to screen - Ages 18-79 yo.  Completed   HPV Vaccine  Aged Out   Colon Cancer Screening  Discontinued  *Topic was postponed. The date shown is not the original due date.    Advanced directives: Advance directive discussed with you today. Even though you declined this today please call our office should you change your mind and we can give you the proper paperwork for you to fill out.  Conditions/risks identified: none  Next appointment: Follow up in one year for your annual wellness visit.   Preventive Care 78 Years and Older, Male  Preventive care refers to lifestyle choices and visits with your health care provider that can promote health and wellness. What does preventive care include? A yearly  physical exam. This is also called an annual well check. Dental exams once or twice a year. Routine eye exams. Ask your health care provider how often you should have your eyes checked. Personal lifestyle choices, including: Daily care of your teeth and gums. Regular physical activity. Eating a healthy diet. Avoiding tobacco and drug use. Limiting alcohol use. Practicing safe sex. Taking low doses of aspirin every day. Taking vitamin and mineral supplements as recommended by your health care provider. What happens during an annual well check? The services and screenings done by your health care provider during your annual well check will depend on your age, overall health, lifestyle risk factors, and family history of disease. Counseling  Your health care provider may ask you questions about your: Alcohol use. Tobacco use. Drug use. Emotional well-being. Home and relationship well-being. Sexual activity. Eating habits. History of falls. Memory and ability to understand (cognition). Work and work Statistician. Screening  You may have the following tests or measurements: Height, weight, and BMI. Blood pressure. Lipid and cholesterol levels. These may be checked every 5 years, or more frequently if you are over 17 years old. Skin check. Lung cancer screening. You may have this screening every year starting at age  55 if you have a 30-pack-year history of smoking and currently smoke or have quit within the past 15 years. Fecal occult blood test (FOBT) of the stool. You may have this test every year starting at age 69. Flexible sigmoidoscopy or colonoscopy. You may have a sigmoidoscopy every 5 years or a colonoscopy every 10 years starting at age 14. Prostate cancer screening. Recommendations will vary depending on your family history and other risks. Hepatitis C blood test. Hepatitis B blood test. Sexually transmitted disease (STD) testing. Diabetes screening. This is done by  checking your blood sugar (glucose) after you have not eaten for a while (fasting). You may have this done every 1-3 years. Abdominal aortic aneurysm (AAA) screening. You may need this if you are a current or former smoker. Osteoporosis. You may be screened starting at age 67 if you are at high risk. Talk with your health care provider about your test results, treatment options, and if necessary, the need for more tests. Vaccines  Your health care provider may recommend certain vaccines, such as: Influenza vaccine. This is recommended every year. Tetanus, diphtheria, and acellular pertussis (Tdap, Td) vaccine. You may need a Td booster every 10 years. Zoster vaccine. You may need this after age 51. Pneumococcal 13-valent conjugate (PCV13) vaccine. One dose is recommended after age 66. Pneumococcal polysaccharide (PPSV23) vaccine. One dose is recommended after age 86. Talk to your health care provider about which screenings and vaccines you need and how often you need them. This information is not intended to replace advice given to you by your health care provider. Make sure you discuss any questions you have with your health care provider. Document Released: 01/21/2015 Document Revised: 09/14/2015 Document Reviewed: 10/26/2014 Elsevier Interactive Patient Education  2017 [place] Prevention in the Home Falls can cause injuries. They can happen to people of all ages. There are many things you can do to make your home safe and to help prevent falls. What can I do on the outside of my home? Regularly fix the edges of walkways and driveways and fix any cracks. Remove anything that might make you trip as you walk through a door, such as a raised step or threshold. Trim any bushes or trees on the path to your home. Use bright outdoor lighting. Clear any walking paths of anything that might make someone trip, such as rocks or tools. Regularly check to see if handrails are loose or  broken. Make sure that both sides of any steps have handrails. Any raised decks and porches should have guardrails on the edges. Have any leaves, snow, or ice cleared regularly. Use sand or salt on walking paths during winter. Clean up any spills in your garage right away. This includes oil or grease spills. What can I do in the bathroom? Use night lights. Install grab bars by the toilet and in the tub and shower. Do not use towel bars as grab bars. Use non-skid mats or decals in the tub or shower. If you need to sit down in the shower, use a plastic, non-slip stool. Keep the floor dry. Clean up any water that spills on the floor as soon as it happens. Remove soap buildup in the tub or shower regularly. Attach bath mats securely with double-sided non-slip rug tape. Do not have throw rugs and other things on the floor that can make you trip. What can I do in the bedroom? Use night lights. Make sure that you have a light by your bed  that is easy to reach. Do not use any sheets or blankets that are too big for your bed. They should not hang down onto the floor. Have a firm chair that has side arms. You can use this for support while you get dressed. Do not have throw rugs and other things on the floor that can make you trip. What can I do in the kitchen? Clean up any spills right away. Avoid walking on wet floors. Keep items that you use a lot in easy-to-reach places. If you need to reach something above you, use a strong step stool that has a grab bar. Keep electrical cords out of the way. Do not use floor polish or wax that makes floors slippery. If you must use wax, use non-skid floor wax. Do not have throw rugs and other things on the floor that can make you trip. What can I do with my stairs? Do not leave any items on the stairs. Make sure that there are handrails on both sides of the stairs and use them. Fix handrails that are broken or loose. Make sure that handrails are as long as  the stairways. Check any carpeting to make sure that it is firmly attached to the stairs. Fix any carpet that is loose or worn. Avoid having throw rugs at the top or bottom of the stairs. If you do have throw rugs, attach them to the floor with carpet tape. Make sure that you have a light switch at the top of the stairs and the bottom of the stairs. If you do not have them, ask someone to add them for you. What else can I do to help prevent falls? Wear shoes that: Do not have high heels. Have rubber bottoms. Are comfortable and fit you well. Are closed at the toe. Do not wear sandals. If you use a stepladder: Make sure that it is fully opened. Do not climb a closed stepladder. Make sure that both sides of the stepladder are locked into place. Ask someone to hold it for you, if possible. Clearly mark and make sure that you can see: Any grab bars or handrails. First and last steps. Where the edge of each step is. Use tools that help you move around (mobility aids) if they are needed. These include: Canes. Walkers. Scooters. Crutches. Turn on the lights when you go into a dark area. Replace any light bulbs as soon as they burn out. Set up your furniture so you have a clear path. Avoid moving your furniture around. If any of your floors are uneven, fix them. If there are any pets around you, be aware of where they are. Review your medicines with your doctor. Some medicines can make you feel dizzy. This can increase your chance of falling. Ask your doctor what other things that you can do to help prevent falls. This information is not intended to replace advice given to you by your health care provider. Make sure you discuss any questions you have with your health care provider. Document Released: 10/21/2008 Document Revised: 06/02/2015 Document Reviewed: 01/29/2014 Elsevier Interactive Patient Education  2017 [employer].

## 2022-02-07 ENCOUNTER — Ambulatory Visit: Payer: Medicare Other | Attending: Cardiology

## 2022-02-07 DIAGNOSIS — Z5181 Encounter for therapeutic drug level monitoring: Secondary | ICD-10-CM

## 2022-02-07 DIAGNOSIS — Z7901 Long term (current) use of anticoagulants: Secondary | ICD-10-CM | POA: Diagnosis not present

## 2022-02-07 DIAGNOSIS — I4821 Permanent atrial fibrillation: Secondary | ICD-10-CM | POA: Diagnosis not present

## 2022-02-07 LAB — POCT INR: INR: 1.9 — AB (ref 2.0–3.0)

## 2022-02-07 NOTE — Patient Instructions (Signed)
TAKE 2 TABLETS TONIGHT ONLY THEN Continue taking 1.5 tablets daily except 1 tablet on Sunday. Repeat INR in 8 weeks. [facility] [phone]

## 2022-02-08 ENCOUNTER — Other Ambulatory Visit: Payer: Self-pay | Admitting: Nurse Practitioner

## 2022-04-04 ENCOUNTER — Ambulatory Visit: Payer: Medicare Other

## 2022-04-11 ENCOUNTER — Ambulatory Visit: Payer: Medicare Other | Attending: Cardiology

## 2022-04-11 DIAGNOSIS — Z7901 Long term (current) use of anticoagulants: Secondary | ICD-10-CM | POA: Diagnosis not present

## 2022-04-11 LAB — POCT INR: INR: 1.9 — AB (ref 2.0–3.0)

## 2022-04-11 NOTE — Patient Instructions (Signed)
Description   TAKE 2 TABLETS TONIGHT ONLY and then START taking 1.5 tablets daily.  Repeat INR in 6 weeks.  [facility] [phone]

## 2022-04-26 ENCOUNTER — Telehealth: Payer: Self-pay | Admitting: Cardiology

## 2022-04-26 DIAGNOSIS — I4821 Permanent atrial fibrillation: Secondary | ICD-10-CM

## 2022-04-26 MED ORDER — WARFARIN SODIUM 4 MG PO TABS
ORAL_TABLET | ORAL | 1 refills | Status: DC
Start: 2022-04-26 — End: 2022-06-21

## 2022-04-26 MED ORDER — WARFARIN SODIUM 4 MG PO TABS
ORAL_TABLET | ORAL | 0 refills | Status: DC
Start: 2022-04-26 — End: 2022-04-26

## 2022-04-26 NOTE — Telephone Encounter (Signed)
*  STAT* If patient is at the pharmacy, call can be transferred to refill team.   1. Which medications need to be refilled? (please list name of each medication and dose if known)   warfarin (COUMADIN) 4 MG tablet   2. Which pharmacy/location (including street and city if local pharmacy) is medication to be sent to?  [facility] - [place], [place] - [address]   3. Do they need a 30 day or 90 day supply?   90 day  Patient states he is completely out of this medication.  Patient wants to get a partial prescription sent to his local pharmacy [id number] - [place], [place] - [address] AT [facility]) until his prescription from [facility] comes in.  Patient wants a call back to confirm prescription sent.

## 2022-04-26 NOTE — Telephone Encounter (Signed)
Warfarin  refill Afib Last INR 04/11/22 Last OV 11/29/21 Spoke with pt and he stated he wanted the local pharmacy to call him once the prescription is ready.  Advised I can send a 2 week supply in, which is 25 tablets, to local pharmacy. Also, he mentioned he did not have a refill at the mail order and was told by them that they will contact us. He is aware I will send a mail order supply as well.

## 2022-05-04 ENCOUNTER — Other Ambulatory Visit: Payer: Self-pay | Admitting: Nurse Practitioner

## 2022-05-04 DIAGNOSIS — E119 Type 2 diabetes mellitus without complications: Secondary | ICD-10-CM

## 2022-05-21 ENCOUNTER — Other Ambulatory Visit: Payer: Self-pay

## 2022-05-23 ENCOUNTER — Ambulatory Visit: Payer: Medicare Other | Attending: Cardiology | Admitting: *Deleted

## 2022-05-23 DIAGNOSIS — I4821 Permanent atrial fibrillation: Secondary | ICD-10-CM

## 2022-05-23 DIAGNOSIS — Z7901 Long term (current) use of anticoagulants: Secondary | ICD-10-CM | POA: Diagnosis not present

## 2022-05-23 LAB — POCT INR: INR: 2.7 (ref 2.0–3.0)

## 2022-05-23 NOTE — Patient Instructions (Signed)
Description   Continue taking warfarin 1.5 tablets daily.  Repeat INR in 6 weeks. [facility] [phone]

## 2022-05-24 ENCOUNTER — Other Ambulatory Visit: Payer: Self-pay

## 2022-05-24 DIAGNOSIS — E11620 Type 2 diabetes mellitus with diabetic dermatitis: Secondary | ICD-10-CM

## 2022-05-24 MED ORDER — TRULICITY 1.5 MG/0.5ML ~~LOC~~ SOAJ
1.5000 mg | SUBCUTANEOUS | 1 refills | Status: DC
Start: 2022-05-24 — End: 2022-11-23

## 2022-05-28 ENCOUNTER — Encounter: Payer: Self-pay | Admitting: Nurse Practitioner

## 2022-05-28 ENCOUNTER — Other Ambulatory Visit: Payer: Self-pay | Admitting: Nurse Practitioner

## 2022-05-28 ENCOUNTER — Ambulatory Visit (INDEPENDENT_AMBULATORY_CARE_PROVIDER_SITE_OTHER): Payer: Medicare Other | Admitting: Nurse Practitioner

## 2022-05-28 VITALS — BP 130/70 | HR 51 | Temp 98.3°F | Ht 68.0 in | Wt 296.6 lb

## 2022-05-28 DIAGNOSIS — Z2821 Immunization not carried out because of patient refusal: Secondary | ICD-10-CM

## 2022-05-28 DIAGNOSIS — E782 Mixed hyperlipidemia: Secondary | ICD-10-CM | POA: Diagnosis not present

## 2022-05-28 DIAGNOSIS — I4821 Permanent atrial fibrillation: Secondary | ICD-10-CM

## 2022-05-28 DIAGNOSIS — I10 Essential (primary) hypertension: Secondary | ICD-10-CM | POA: Diagnosis not present

## 2022-05-28 DIAGNOSIS — L97812 Non-pressure chronic ulcer of other part of right lower leg with fat layer exposed: Secondary | ICD-10-CM | POA: Diagnosis not present

## 2022-05-28 DIAGNOSIS — E662 Morbid (severe) obesity with alveolar hypoventilation: Secondary | ICD-10-CM

## 2022-05-28 DIAGNOSIS — Z6841 Body Mass Index (BMI) 40.0 and over, adult: Secondary | ICD-10-CM

## 2022-05-28 DIAGNOSIS — E11620 Type 2 diabetes mellitus with diabetic dermatitis: Secondary | ICD-10-CM | POA: Diagnosis not present

## 2022-05-28 NOTE — Progress Notes (Signed)
Hershal Coria Martin,acting as a Neurosurgeon for [name], FNP.,have documented all relevant documentation on the behalf of [name], FNP,as directed by  [name], FNP while in the presence of [name], FNP.    Subjective:     Patient ID: Brett [name] , male    DOB: 17-Dec-1945 , 77 y.o.   MRN: [id number]   Chief Complaint  Patient presents with   Hypertension   Diabetes    HPI  Patient presents today for a DM and BP check, Patient reports compliance with medications and has no other concerns today. He continues to have his PT/INR checked at the coumadin clinic. Completed handicapped renewal.   BP Readings from Last 3 Encounters: 05/28/22 : 130/70 01/24/22 : 126/70 01/24/22 : 126/70    Diabetes He presents for his follow-up diabetic visit. He has type 2 diabetes mellitus. His disease course has been stable. Pertinent negatives for hypoglycemia include no dizziness or headaches. Pertinent negatives for diabetes include no blurred vision and no chest pain. There are no hypoglycemic complications. There are no diabetic complications. Risk factors for coronary artery disease include sedentary lifestyle, obesity, male sex, hypertension and diabetes mellitus. Current diabetic treatment includes oral agent (dual therapy). He is compliant with treatment all of the time. He is following a generally unhealthy diet. When asked about meal planning, he reported none. He has not had a previous visit with a dietitian. He rarely participates in exercise. There is no change in his home blood glucose trend. (Blood sugars (fasting) 74-140 ) He does not see a podiatrist. Hypertension This is a chronic problem. The current episode started more than 1 year ago. The problem has been rapidly improving since onset. The problem is uncontrolled. Pertinent negatives include no anxiety, blurred vision, chest pain, headaches or palpitations. There are no associated agents to hypertension. Risk factors for  coronary artery disease include obesity and male gender. Past treatments include ACE inhibitors and alpha 1 blockers. The current treatment provides moderate improvement. There are no compliance problems.  There is no history of angina or kidney disease. There is no history of chronic renal disease.     Past Medical History:  Diagnosis Date   Cellulitis of right lower extremity 05/22/2018   DVT of lower extremity, bilateral (HCC) 06/17/2012   Right CFV and popliteal vein, left C. it the only;  also interstitial fluid noted in both calfs   Dyslipidemia    History of: Bacteremia due to coagulase-negative Staphylococcus 06/13/2012   During hospitalization May 30 through 06/19/2012    History: TTP (thrombotic thrombocytopenic purpura) 06/11/2012   Hypertension    Obesity (BMI 30-39.9)    OSA on CPAP    Persistent atrial fibrillation (HCC) 02/09/2011   Negative Myoview; Normal EF by Echo   Severe sepsis with acute organ dysfunction (HCC) 5/30-6/12/2012   Prolonged hospitalization for presumed sepsis and septic shock compounded by coagulopathy thought to be TTP versus Pradaxa mediated. Temporarily on hemodialysis and plasmapheresis.   Type 2 diabetes mellitus without complication, without long-term current use of insulin (HCC) 09/09/2019     Family History  Problem Relation Age of Onset   Heart failure Mother 85   Cancer Father        stomach     Current Outpatient Medications:    acetaminophen (TYLENOL) 500 MG tablet, Take 1,000 mg by mouth daily. , Disp: , Rfl:    amLODipine (NORVASC) 10 MG tablet, Take 0.5 tablets by mouth daily., Disp: , Rfl:  ammonium lactate (AMLACTIN) 12 % lotion, Apply 1 application topically as needed for dry skin., Disp: 400 g, Rfl: 3   blood glucose meter kit and supplies KIT, Dispense based on patient and insurance preference. Use up to four times daily as directed. (FOR ICD-9 250.00, 250.01)., Disp: 1 each, Rfl: 0   Blood Glucose Monitoring Suppl  (FREESTYLE LITE) DEVI, Use to test blood sugar 3 times a day. Dx code e11.65, Disp: 1 each, Rfl: 3   celecoxib (CELEBREX) 200 MG capsule, Take 1 capsule by mouth daily., Disp: , Rfl:    diltiazem (CARDIZEM CD) 240 MG 24 hr capsule, TAKE 1 CAPSULE DAILY, Disp: 90 capsule, Rfl: 3   diphenhydramine-acetaminophen (TYLENOL PM) 25-500 MG TABS, Take 2 tablets by mouth at bedtime., Disp: , Rfl:    Dulaglutide (TRULICITY) 1.5 MG/0.5ML SOPN, Inject 1.5 mg into the skin once a week., Disp: 6 mL, Rfl: 1   fexofenadine (ALLEGRA) 180 MG tablet, Take 1 tablet by mouth daily., Disp: , Rfl:    fish oil-omega-3 fatty acids 1000 MG capsule, Take 1 g by mouth daily., Disp: , Rfl:    FREESTYLE LITE test strip, TEST UP TO FOUR TIMES A DAY AS DIRECTED, Disp: 300 strip, Rfl: 3   furosemide (LASIX) 20 MG tablet, Take 1 tablet (20 mg total) by mouth 2 (two) times daily., Disp: 180 tablet, Rfl: 2   hydrALAZINE (APRESOLINE) 50 MG tablet, TAKE 1 TABLET TWICE A DAY WITH FOOD, Disp: 180 tablet, Rfl: 3   JARDIANCE 10 MG TABS tablet, TAKE 1 TABLET DAILY BEFORE BREAKFAST, Disp: 90 tablet, Rfl: 3   Lancets (FREESTYLE) lancets, TEST UP TO FOUR TIMES A DAY AS DIRECTED, Disp: 300 each, Rfl: 3   lisinopril-hydrochlorothiazide (ZESTORETIC) 20-25 MG tablet, Take 1 tablet by mouth daily., Disp: , Rfl:    loratadine (CLARITIN) 10 MG tablet, TAKE 1 TABLET DAILY, Disp: 90 tablet, Rfl: 3   metFORMIN (GLUCOPHAGE-XR) 750 MG 24 hr tablet, TAKE 1 TABLET DAILY, Disp: 90 tablet, Rfl: 3   metoprolol tartrate (LOPRESSOR) 100 MG tablet, TAKE 1 TABLET THREE TIMES A DAY, Disp: 270 tablet, Rfl: 3   Multiple Vitamin (MULITIVITAMIN WITH MINERALS) TABS, Take 1 tablet by mouth daily., Disp: , Rfl:    mupirocin ointment (BACTROBAN) 2 %, 1 application to affected area small amount Externally Three times a day for 4 weeks, Disp: , Rfl:    NON FORMULARY, at bedtime. CPAP, Disp: , Rfl:    pravastatin (PRAVACHOL) 80 MG tablet, TAKE 1 TABLET DAILY, Disp: 90 tablet,  Rfl: 3   warfarin (COUMADIN) 4 MG tablet, TAKE ONE TO ONE AND ONE-HALF TABLETS DAILY AS DIRECTED PER COUMADIN CLINIC, Disp: 135 tablet, Rfl: 1   No Known Allergies   Review of Systems  Constitutional: Negative.   Eyes:  Negative for blurred vision.  Respiratory: Negative.    Cardiovascular: Negative.  Negative for chest pain, palpitations and leg swelling.  Gastrointestinal: Negative.   Neurological: Negative.  Negative for dizziness and headaches.     Today's Vitals   05/28/22 1431  BP: 130/70  Pulse: (!) 51  Temp: 98.3 F (36.8 C)  TempSrc: Oral  Weight: 296 lb 9.6 oz (134.5 kg)  Height: 5\' 8"  (1.727 m)  PainSc: 0-No pain   Body mass index is 45.1 kg/m.  Wt Readings from Last 3 Encounters:  05/28/22 296 lb 9.6 oz (134.5 kg)  01/24/22 298 lb (135.2 kg)  01/24/22 298 lb 9.6 oz (135.4 kg)    The ASCVD Risk score (  Arnett DK, et al., 2019) failed to calculate for the following reasons:   The valid total cholesterol range is 130 to 320 mg/dL  Objective:  Physical Exam Vitals reviewed.  Constitutional:      General: He is not in acute distress.    Appearance: Normal appearance. He is obese.     Comments: Central obesity  Cardiovascular:     Rate and Rhythm: Normal rate and regular rhythm.     Pulses: Normal pulses.     Heart sounds: Normal heart sounds. No murmur heard. Pulmonary:     Effort: Pulmonary effort is normal. No respiratory distress.     Breath sounds: Normal breath sounds. No wheezing.  Musculoskeletal:        General: No swelling.     Right lower leg: Edema (trace edema - much improved with his wraps) present.     Left lower leg: Edema (trace edema - wearing wraps) present.     Comments: Using a cane to ambulate. has a few cratered areas. He is wearing compressive garments to lower extremities  Skin:    General: Skin is warm and dry.     Capillary Refill: Capillary refill takes less than 2 seconds.     Findings: No rash.     Comments: Lower  extremities are reddened with scaly skin, firm to touch  Neurological:     General: No focal deficit present.     Mental Status: He is alert and oriented to person, place, and time.     Cranial Nerves: No cranial nerve deficit.     Motor: No weakness.  Psychiatric:        Mood and Affect: Mood normal.        Behavior: Behavior normal.        Thought Content: Thought content normal.        Judgment: Judgment normal.         Assessment And Plan:     1. Type 2 diabetes mellitus with diabetic dermatitis, without long-term current use of insulin (HCC) Comments: HgbA1c is improving, continue current medications. - Hemoglobin A1c  2. Essential hypertension Comments: Blood pressure is fairly controlled, continue current medications. - Basic metabolic panel  3. Mixed hyperlipidemia Comments: Cholesterol levels are stable, continue focusing on low fat diet. - Lipid panel  4. Permanent atrial fibrillation (HCC) Comments: Continue current medications  5. Herpes zoster vaccination declined Declines shingrix, educated on disease process and is aware if he changes his mind to notify office  6. COVID-19 vaccination declined Declines covid 19 vaccine. Discussed risk of covid 67 and if he changes her mind about the vaccine to call the office. Education has been provided regarding the importance of this vaccine but patient still declined. Advised may receive this vaccine at local pharmacy or Health Dept.or vaccine clinic. Aware to provide a copy of the vaccination record if obtained from local pharmacy or Health Dept.  Encouraged to take multivitamin, vitamin d, vitamin c and zinc to increase immune system. Aware can call office if would like to have vaccine here at office. Verbalized acceptance and understanding.  7. Class 3 obesity with alveolar hypoventilation and body mass index (BMI) of 45.0 to 49.9 in adult, unspecified whether serious comorbidity present [facility]) He is encouraged to strive  for BMI less than 30 to decrease cardiac risk. Advised to aim for at least 150 minutes of exercise per week.  8. Non-pressure chronic ulcer of other part of right lower leg with fat layer exposed (  HCC) Comments: Continue f/u with vein and vascular and wear compression device    Return for controlled DM check 4 months.   Patient was given opportunity to ask questions. Patient verbalized understanding of the plan and was able to repeat key elements of the plan. All questions were answered to their satisfaction.  [name], FNP    I, [name], FNP, have reviewed all documentation for this visit. The documentation on 05/28/22 for the exam, diagnosis, procedures, and orders are all accurate and complete.  IF YOU HAVE BEEN REFERRED TO A SPECIALIST, IT MAY TAKE 1-2 WEEKS TO SCHEDULE/PROCESS THE REFERRAL. IF YOU HAVE NOT HEARD FROM US/SPECIALIST IN TWO WEEKS, PLEASE GIVE [place] A CALL AT [phone] X [phone].   THE PATIENT IS ENCOURAGED TO PRACTICE SOCIAL DISTANCING DUE TO THE COVID-19 PANDEMIC.

## 2022-05-28 NOTE — Patient Instructions (Addendum)
Call Dr. Dione Booze for your eye exam for the yeat at Phone: [phone]

## 2022-05-29 LAB — LIPID PANEL
Chol/HDL Ratio: 3.9 ratio (ref 0.0–4.4)
Cholesterol, Total: 110 mg/dL (ref 100–199)
HDL: 28 mg/dL — ABNORMAL LOW (ref >39)
LDL Chol Calc (NIH): 58 mg/dL (ref 0–99)
Triglycerides: 137 mg/dL (ref 0–149)
VLDL Cholesterol Cal: 24 mg/dL (ref 5–40)

## 2022-05-29 LAB — BASIC METABOLIC PANEL
BUN/Creatinine Ratio: 21 (ref 12–28)
BUN: 21 mg/dL (ref 8–27)
CO2: 22 mmol/L (ref 20–29)
Calcium: 9.4 mg/dL (ref 8.7–10.3)
Chloride: 100 mmol/L (ref 96–106)
Creatinine, Ser: 0.98 mg/dL (ref 0.57–1.00)
Glucose: 77 mg/dL (ref 70–99)
Potassium: 4.7 mmol/L (ref 3.5–5.2)
Sodium: 142 mmol/L (ref 134–144)
eGFR: 59 mL/min/{1.73_m2} — ABNORMAL LOW (ref >59)

## 2022-05-29 LAB — HEMOGLOBIN A1C
Est. average glucose Bld gHb Est-mCnc: 126 mg/dL
Hgb A1c MFr Bld: 6 % — ABNORMAL HIGH (ref 4.8–5.6)

## 2022-06-10 ENCOUNTER — Encounter: Payer: Self-pay | Admitting: Nurse Practitioner

## 2022-06-14 ENCOUNTER — Other Ambulatory Visit: Payer: Self-pay | Admitting: Nurse Practitioner

## 2022-06-21 ENCOUNTER — Other Ambulatory Visit: Payer: Self-pay

## 2022-06-21 DIAGNOSIS — I4821 Permanent atrial fibrillation: Secondary | ICD-10-CM

## 2022-06-21 MED ORDER — WARFARIN SODIUM 4 MG PO TABS
ORAL_TABLET | ORAL | 0 refills | Status: DC
Start: 2022-06-21 — End: 2022-11-12

## 2022-06-21 NOTE — Telephone Encounter (Signed)
Prescription refill request received for warfarin Lov: 11/29/21 Brett [name]) Next INR check: 07/04/22 Warfarin tablet strength: 4mg   Appropriate dose. Refill sent.

## 2022-07-04 ENCOUNTER — Ambulatory Visit: Payer: Medicare Other | Attending: Cardiology | Admitting: *Deleted

## 2022-07-04 DIAGNOSIS — I4821 Permanent atrial fibrillation: Secondary | ICD-10-CM | POA: Diagnosis not present

## 2022-07-04 DIAGNOSIS — Z7901 Long term (current) use of anticoagulants: Secondary | ICD-10-CM

## 2022-07-04 LAB — POCT INR: INR: 2.4 (ref 2.0–3.0)

## 2022-07-04 NOTE — Patient Instructions (Signed)
Description   Continue taking warfarin 1.5 tablets daily.  Repeat INR in 6 weeks. [facility] [phone]     

## 2022-07-11 ENCOUNTER — Other Ambulatory Visit: Payer: Self-pay | Admitting: Nurse Practitioner

## 2022-07-24 ENCOUNTER — Other Ambulatory Visit: Payer: Self-pay

## 2022-07-24 MED ORDER — DILTIAZEM HCL ER COATED BEADS 240 MG PO CP24: 240.0000 mg | ORAL_CAPSULE | Freq: Every day | ORAL | 3 refills | Status: DC

## 2022-08-07 ENCOUNTER — Other Ambulatory Visit: Payer: Self-pay | Admitting: Cardiology

## 2022-08-15 ENCOUNTER — Ambulatory Visit: Payer: Medicare Other | Attending: Cardiology | Admitting: *Deleted

## 2022-08-15 DIAGNOSIS — Z7901 Long term (current) use of anticoagulants: Secondary | ICD-10-CM | POA: Insufficient documentation

## 2022-08-15 DIAGNOSIS — I4821 Permanent atrial fibrillation: Secondary | ICD-10-CM | POA: Insufficient documentation

## 2022-08-15 LAB — POCT INR: INR: 3.1 — AB (ref 2.0–3.0)

## 2022-08-15 NOTE — Patient Instructions (Signed)
Description   Today take 1 tablet of warfarin then continue taking warfarin 1.5 tablets daily.  Repeat INR in 6 weeks. [facility] [phone]

## 2022-09-26 ENCOUNTER — Ambulatory Visit: Payer: Medicare Other | Attending: Cardiovascular Disease | Admitting: *Deleted

## 2022-09-26 DIAGNOSIS — Z7901 Long term (current) use of anticoagulants: Secondary | ICD-10-CM | POA: Diagnosis not present

## 2022-09-26 DIAGNOSIS — I4821 Permanent atrial fibrillation: Secondary | ICD-10-CM

## 2022-09-26 LAB — POCT INR: INR: 2.9 (ref 2.0–3.0)

## 2022-09-26 NOTE — Patient Instructions (Addendum)
Description   Continue taking warfarin 1.5 tablets daily.  Repeat INR in 6 weeks. [facility] [phone]

## 2022-10-01 ENCOUNTER — Ambulatory Visit: Payer: Medicare Other | Admitting: Nurse Practitioner

## 2022-10-01 ENCOUNTER — Encounter: Payer: Self-pay | Admitting: Nurse Practitioner

## 2022-10-01 VITALS — BP 130/70 | HR 75 | Temp 98.7°F | Ht 68.0 in | Wt 290.8 lb

## 2022-10-01 DIAGNOSIS — G4733 Obstructive sleep apnea (adult) (pediatric): Secondary | ICD-10-CM

## 2022-10-01 DIAGNOSIS — I4821 Permanent atrial fibrillation: Secondary | ICD-10-CM | POA: Diagnosis not present

## 2022-10-01 DIAGNOSIS — I129 Hypertensive chronic kidney disease with stage 1 through stage 4 chronic kidney disease, or unspecified chronic kidney disease: Secondary | ICD-10-CM

## 2022-10-01 DIAGNOSIS — E1122 Type 2 diabetes mellitus with diabetic chronic kidney disease: Secondary | ICD-10-CM

## 2022-10-01 DIAGNOSIS — N182 Chronic kidney disease, stage 2 (mild): Secondary | ICD-10-CM

## 2022-10-01 DIAGNOSIS — E11620 Type 2 diabetes mellitus with diabetic dermatitis: Secondary | ICD-10-CM

## 2022-10-01 DIAGNOSIS — I10 Essential (primary) hypertension: Secondary | ICD-10-CM

## 2022-10-01 DIAGNOSIS — Z6841 Body Mass Index (BMI) 40.0 and over, adult: Secondary | ICD-10-CM | POA: Diagnosis not present

## 2022-10-01 DIAGNOSIS — E782 Mixed hyperlipidemia: Secondary | ICD-10-CM | POA: Diagnosis not present

## 2022-10-01 DIAGNOSIS — Z2821 Immunization not carried out because of patient refusal: Secondary | ICD-10-CM | POA: Diagnosis not present

## 2022-10-01 HISTORY — DX: Chronic kidney disease, stage 2 (mild): N18.2

## 2022-10-01 NOTE — Progress Notes (Signed)
[name], CMA,acting as a Neurosurgeon for Brett Felts, FNP.,have documented all relevant documentation on the behalf of Brett Felts, FNP,as directed by  Brett Felts, FNP while in the presence of Brett Felts, FNP.  Subjective:  Patient ID: Brett [name] , male    DOB: 08/24/1945 , 77 y.o.   MRN: [id number]  Chief Complaint  Patient presents with   Hypertension   Diabetes    HPI  Patient presents today for a bp and dm follow up, Patient reports compliance with medication. Patient denies any chest pain, SOB, or headaches. Patient has no concerns today. Patient reports having his eye appointment scheduled. He has his metformin now was out for about 3 weeks.   BP Readings from Last 3 Encounters: 10/01/22 : 130/70 05/28/22 : 130/70 01/24/22 : 126/70   Wt Readings from Last 3 Encounters: 10/01/22 : 290 lb 12.8 oz (131.9 kg) 05/28/22 : 296 lb 9.6 oz (134.5 kg) 01/24/22 : 298 lb (135.2 kg)    Diabetes He presents for his follow-up diabetic visit. He has type 2 diabetes mellitus. His disease course has been stable. Pertinent negatives for hypoglycemia include no dizziness or headaches. Pertinent negatives for diabetes include no blurred vision and no chest pain. There are no hypoglycemic complications. Diabetic complications include nephropathy. (Skin dermatitis) Risk factors for coronary artery disease include sedentary lifestyle, obesity, male sex, hypertension and diabetes mellitus. Current diabetic treatment includes oral agent (dual therapy). He is compliant with treatment all of the time. He is following a generally unhealthy diet. When asked about meal planning, he reported none. He has not had a previous visit with a dietitian. He rarely participates in exercise. There is no change in his home blood glucose trend. (Blood sugars (fasting) 74-140 ) He does not see a podiatrist.Eye exam current: scheduled with Dr. Dione Booze next month.  Hypertension This is a chronic problem. The  current episode started more than 1 year ago. The problem has been rapidly improving since onset. The problem is uncontrolled. Pertinent negatives include no anxiety, blurred vision, chest pain, headaches or palpitations. There are no associated agents to hypertension. Risk factors for coronary artery disease include obesity and male gender. Past treatments include ACE inhibitors and alpha 1 blockers. The current treatment provides moderate improvement. There are no compliance problems.  There is no history of angina or kidney disease. There is no history of chronic renal disease.     Past Medical History:  Diagnosis Date   Cellulitis of right lower extremity 05/22/2018   DVT of lower extremity, bilateral (HCC) 06/17/2012   Right CFV and popliteal vein, left C. it the only;  also interstitial fluid noted in both calfs   Dyslipidemia    History of: Bacteremia due to coagulase-negative Staphylococcus 06/13/2012   During hospitalization May 30 through 06/19/2012    History: TTP (thrombotic thrombocytopenic purpura) 06/11/2012   Hypertension    Obesity (BMI 30-39.9)    OSA on CPAP    Persistent atrial fibrillation (HCC) 02/09/2011   Negative Myoview; Normal EF by Echo   Severe sepsis with acute organ dysfunction (HCC) 5/30-6/12/2012   Prolonged hospitalization for presumed sepsis and septic shock compounded by coagulopathy thought to be TTP versus Pradaxa mediated. Temporarily on hemodialysis and plasmapheresis.   Stage 2 chronic kidney disease 10/01/2022   Type 2 diabetes mellitus without complication, without long-term current use of insulin (HCC) 09/09/2019     Family History  Problem Relation Age of Onset   Heart failure Mother 96  Cancer Father        stomach     Current Outpatient Medications:    acetaminophen (TYLENOL) 500 MG tablet, Take 1,000 mg by mouth daily. , Disp: , Rfl:    ammonium lactate (AMLACTIN) 12 % lotion, Apply 1 application topically as needed for dry skin., Disp:  400 g, Rfl: 3   blood glucose meter kit and supplies KIT, Dispense based on patient and insurance preference. Use up to four times daily as directed. (FOR ICD-9 250.00, 250.01)., Disp: 1 each, Rfl: 0   Blood Glucose Monitoring Suppl (FREESTYLE LITE) DEVI, Use to test blood sugar 3 times a day. Dx code e11.65, Disp: 1 each, Rfl: 3   diltiazem (CARDIZEM CD) 240 MG 24 hr capsule, Take 1 capsule (240 mg total) by mouth daily., Disp: 90 capsule, Rfl: 3   Dulaglutide (TRULICITY) 1.5 MG/0.5ML SOPN, Inject 1.5 mg into the skin once a week., Disp: 6 mL, Rfl: 1   fexofenadine (ALLEGRA) 180 MG tablet, Take 1 tablet by mouth daily., Disp: , Rfl:    fish oil-omega-3 fatty acids 1000 MG capsule, Take 1 g by mouth daily., Disp: , Rfl:    FREESTYLE LITE test strip, TEST UP TO FOUR TIMES A DAY AS DIRECTED, Disp: 300 strip, Rfl: 3   furosemide (LASIX) 20 MG tablet, Take 1 tablet (20 mg total) by mouth 2 (two) times daily. PATIENT MUST SCHEDULED ANNUAL OFFICE VISIT FOR FUTURE REFILLS, Disp: 180 tablet, Rfl: 0   JARDIANCE 10 MG TABS tablet, TAKE 1 TABLET DAILY BEFORE BREAKFAST, Disp: 90 tablet, Rfl: 3   Lancets (FREESTYLE) lancets, TEST UP TO FOUR TIMES A DAY AS DIRECTED, Disp: 300 each, Rfl: 3   loratadine (CLARITIN) 10 MG tablet, TAKE 1 TABLET DAILY, Disp: 90 tablet, Rfl: 3   metFORMIN (GLUCOPHAGE-XR) 750 MG 24 hr tablet, TAKE 1 TABLET DAILY, Disp: 90 tablet, Rfl: 3   metoprolol tartrate (LOPRESSOR) 100 MG tablet, TAKE 1 TABLET THREE TIMES A DAY, Disp: 270 tablet, Rfl: 3   Multiple Vitamin (MULITIVITAMIN WITH MINERALS) TABS, Take 1 tablet by mouth daily., Disp: , Rfl:    mupirocin ointment (BACTROBAN) 2 %, 1 application to affected area small amount Externally Three times a day for 4 weeks, Disp: , Rfl:    NON FORMULARY, at bedtime. CPAP, Disp: , Rfl:    pravastatin (PRAVACHOL) 80 MG tablet, TAKE 1 TABLET DAILY, Disp: 90 tablet, Rfl: 3   warfarin (COUMADIN) 4 MG tablet, TAKE ONE TO ONE AND ONE-HALF TABLETS DAILY AS  DIRECTED PER COUMADIN CLINIC, Disp: 140 tablet, Rfl: 0   hydrALAZINE (APRESOLINE) 50 MG tablet, TAKE 1 TABLET TWICE A DAY WITH FOOD, Disp: 180 tablet, Rfl: 3   No Known Allergies   Review of Systems  Constitutional: Negative.   HENT: Negative.    Eyes: Negative.  Negative for blurred vision.  Respiratory: Negative.    Cardiovascular: Negative.  Negative for chest pain and palpitations.  Gastrointestinal: Negative.   Endocrine: Negative.   Genitourinary: Negative.   Musculoskeletal: Negative.   Skin: Negative.   Allergic/Immunologic: Negative.   Neurological: Negative.  Negative for dizziness and headaches.  Hematological: Negative.   Psychiatric/Behavioral: Negative.       Today's Vitals   10/01/22 1458  BP: 130/70  Pulse: 75  Temp: 98.7 F (37.1 C)  TempSrc: Oral  Weight: 290 lb 12.8 oz (131.9 kg)  Height: 5\' 8"  (1.727 m)  PainSc: 0-No pain   Body mass index is 44.22 kg/m.  Wt Readings from Last  3 Encounters:  10/01/22 290 lb 12.8 oz (131.9 kg)  05/28/22 296 lb 9.6 oz (134.5 kg)  01/24/22 298 lb (135.2 kg)     Objective:  Physical Exam Vitals reviewed.  Constitutional:      General: He is not in acute distress.    Appearance: Normal appearance. He is obese.     Comments: Central obesity  Cardiovascular:     Rate and Rhythm: Normal rate and regular rhythm.     Pulses: Normal pulses.     Heart sounds: Normal heart sounds. No murmur heard. Pulmonary:     Effort: Pulmonary effort is normal. No respiratory distress.     Breath sounds: Normal breath sounds. No wheezing.  Musculoskeletal:        General: No swelling.     Right lower leg: Edema (trace edema - much improved with his wraps) present.     Left lower leg: Edema (trace edema - wearing wraps) present.     Comments: Using a cane to ambulate. He is wearing compressive garments to lower extremities  Skin:    General: Skin is warm and dry.     Capillary Refill: Capillary refill takes less than 2 seconds.      Findings: No rash.     Comments: Lower extremities are reddened with scaly skin, firm to touch  Neurological:     General: No focal deficit present.     Mental Status: He is alert and oriented to person, place, and time.     Cranial Nerves: No cranial nerve deficit.     Motor: No weakness.  Psychiatric:        Mood and Affect: Mood normal.        Behavior: Behavior normal.        Thought Content: Thought content normal.        Judgment: Judgment normal.         Assessment And Plan:  Type 2 diabetes mellitus with diabetic dermatitis, without long-term current use of insulin (HCC) Assessment & Plan: Hemoglobin A1c slightly improved.  Continue Trulicity and metformin.  Was out of metformin for about 3 weeks.  Will check hemoglobin A1c  Orders: -     Hemoglobin A1c -     Microalbumin / creatinine urine ratio  Essential hypertension Assessment & Plan: Blood pressure is fairly controlled, continue focusing on lifestyle modifications.  Will check a GFR  Orders: -     Basic metabolic panel -     Microalbumin / creatinine urine ratio  Mixed hyperlipidemia Assessment & Plan: Cholesterol levels are stable. HDL remains low encouraged to his intake of fish, nuts  Orders: -     Lipid panel  Stage 2 chronic kidney disease  OSA on CPAP Assessment & Plan: Stable continue wearing CPAP   COVID-19 vaccination declined Assessment & Plan: Declines covid 19 vaccine. Discussed risk of covid 52 and if he changes her mind about the vaccine to call the office. Education has been provided regarding the importance of this vaccine but patient still declined. Advised may receive this vaccine at local pharmacy or Health Dept.or vaccine clinic. Aware to provide a copy of the vaccination record if obtained from local pharmacy or Health Dept.  Encouraged to take multivitamin, vitamin d, vitamin c and zinc to increase immune system. Aware can call office if would like to have vaccine here at  office. Verbalized acceptance and understanding.     Herpes zoster vaccination declined Assessment & Plan: Declines shingrix, educated on disease  process and is aware if he changes his mind to notify office    Influenza vaccination declined Assessment & Plan: Patient declined influenza vaccination at this time. Patient is aware that influenza vaccine prevents illness in 70% of healthy people, and reduces hospitalizations to 30-70% in elderly. This vaccine is recommended annually. Education has been provided regarding the importance of this vaccine but patient still declined. Advised may receive this vaccine at local pharmacy or Health Dept.or vaccine clinic. Aware to provide a copy of the vaccination record if obtained from local pharmacy or Health Dept.  Pt is willing to accept risk associated with refusing vaccination.    Tetanus, diphtheria, and acellular pertussis (Tdap) vaccination declined  Class 3 severe obesity due to excess calories with serious comorbidity and body mass index (BMI) of 40.0 to 44.9 in adult [facility]) Assessment & Plan: he is encouraged to strive for BMI less than 30 to decrease cardiac risk. Advised to aim for at least 150 minutes of exercise per week.    Permanent atrial fibrillation (HCC) Assessment & Plan: Continue follow-up with Coumadin clinic   Benign hypertension with CKD (chronic kidney disease), stage II Assessment & Plan: Blood pressure is fairly controlled, continue focusing on lifestyle modifications.  Will check a GFR     Return for controlled DM check 4 months.  Patient was given opportunity to ask questions. Patient verbalized understanding of the plan and was able to repeat key elements of the plan. All questions were answered to their satisfaction.    Jeanell Sparrow, FNP, have reviewed all documentation for this visit. The documentation on 10/01/22 for the exam, diagnosis, procedures, and orders are all accurate and complete.   IF YOU  HAVE BEEN REFERRED TO A SPECIALIST, IT MAY TAKE 1-2 WEEKS TO SCHEDULE/PROCESS THE REFERRAL. IF YOU HAVE NOT HEARD FROM US/SPECIALIST IN TWO WEEKS, PLEASE GIVE [place] A CALL AT [phone].

## 2022-10-02 LAB — HEMOGLOBIN A1C
Est. average glucose Bld gHb Est-mCnc: 120 mg/dL
Hgb A1c MFr Bld: 5.8 % — ABNORMAL HIGH (ref 4.8–5.6)

## 2022-10-02 LAB — BASIC METABOLIC PANEL
BUN/Creatinine Ratio: 16 (ref 10–24)
BUN: 21 mg/dL (ref 8–27)
CO2: 26 mmol/L (ref 20–29)
Calcium: 9.5 mg/dL (ref 8.6–10.2)
Chloride: 100 mmol/L (ref 96–106)
Creatinine, Ser: 1.29 mg/dL — ABNORMAL HIGH (ref 0.76–1.27)
Glucose: 72 mg/dL (ref 70–99)
Potassium: 4.9 mmol/L (ref 3.5–5.2)
Sodium: 140 mmol/L (ref 134–144)
eGFR: 57 mL/min/{1.73_m2} — ABNORMAL LOW (ref >59)

## 2022-10-02 LAB — LIPID PANEL
Chol/HDL Ratio: 3.5 ratio (ref 0.0–5.0)
Cholesterol, Total: 97 mg/dL — ABNORMAL LOW (ref 100–199)
HDL: 28 mg/dL — ABNORMAL LOW (ref >39)
LDL Chol Calc (NIH): 46 mg/dL (ref 0–99)
Triglycerides: 127 mg/dL (ref 0–149)
VLDL Cholesterol Cal: 23 mg/dL (ref 5–40)

## 2022-10-03 DIAGNOSIS — E11620 Type 2 diabetes mellitus with diabetic dermatitis: Secondary | ICD-10-CM | POA: Diagnosis not present

## 2022-10-03 DIAGNOSIS — I10 Essential (primary) hypertension: Secondary | ICD-10-CM | POA: Diagnosis not present

## 2022-10-04 LAB — MICROALBUMIN / CREATININE URINE RATIO
Creatinine, Urine: 90.4 mg/dL
Microalb/Creat Ratio: 160 mg/g creat — ABNORMAL HIGH (ref 0–29)
Microalbumin, Urine: 144.4 ug/mL

## 2022-10-09 ENCOUNTER — Other Ambulatory Visit: Payer: Self-pay | Admitting: Nurse Practitioner

## 2022-10-09 DIAGNOSIS — N1831 Chronic kidney disease, stage 3a: Secondary | ICD-10-CM

## 2022-10-11 DIAGNOSIS — Z2821 Immunization not carried out because of patient refusal: Secondary | ICD-10-CM | POA: Insufficient documentation

## 2022-10-11 NOTE — Assessment & Plan Note (Addendum)
Blood pressure is fairly controlled, continue focusing on lifestyle modifications.  Will check a GFR

## 2022-10-11 NOTE — Assessment & Plan Note (Signed)
Cholesterol levels are stable. HDL remains low encouraged to his intake of fish, nuts

## 2022-10-11 NOTE — Assessment & Plan Note (Signed)
Continue follow-up with Coumadin clinic 

## 2022-10-11 NOTE — Assessment & Plan Note (Signed)
Hemoglobin A1c slightly improved.  Continue Trulicity and metformin.  Was out of metformin for about 3 weeks.  Will check hemoglobin A1c

## 2022-10-11 NOTE — Assessment & Plan Note (Signed)
Declines shingrix, educated on disease process and is aware if he changes his mind to notify office  

## 2022-10-11 NOTE — Assessment & Plan Note (Signed)
Stable continue wearing CPAP

## 2022-10-11 NOTE — Assessment & Plan Note (Signed)
he is encouraged to strive for BMI less than 30 to decrease cardiac risk. Advised to aim for at least 150 minutes of exercise per week.

## 2022-10-11 NOTE — Assessment & Plan Note (Signed)

## 2022-10-11 NOTE — Assessment & Plan Note (Signed)

## 2022-11-05 ENCOUNTER — Other Ambulatory Visit: Payer: Self-pay | Admitting: Cardiology

## 2022-11-06 DIAGNOSIS — E119 Type 2 diabetes mellitus without complications: Secondary | ICD-10-CM | POA: Diagnosis not present

## 2022-11-06 DIAGNOSIS — H04123 Dry eye syndrome of bilateral lacrimal glands: Secondary | ICD-10-CM | POA: Diagnosis not present

## 2022-11-06 DIAGNOSIS — H43813 Vitreous degeneration, bilateral: Secondary | ICD-10-CM | POA: Diagnosis not present

## 2022-11-06 DIAGNOSIS — H0102B Squamous blepharitis left eye, upper and lower eyelids: Secondary | ICD-10-CM | POA: Diagnosis not present

## 2022-11-06 DIAGNOSIS — H0102A Squamous blepharitis right eye, upper and lower eyelids: Secondary | ICD-10-CM | POA: Diagnosis not present

## 2022-11-06 DIAGNOSIS — Z961 Presence of intraocular lens: Secondary | ICD-10-CM | POA: Diagnosis not present

## 2022-11-06 LAB — HM DIABETES EYE EXAM

## 2022-11-07 ENCOUNTER — Ambulatory Visit: Payer: Medicare Other | Attending: Cardiology

## 2022-11-07 DIAGNOSIS — Z7901 Long term (current) use of anticoagulants: Secondary | ICD-10-CM | POA: Insufficient documentation

## 2022-11-07 LAB — POCT INR: INR: 3.4 — AB (ref 2.0–3.0)

## 2022-11-07 NOTE — Patient Instructions (Addendum)
Description   HOLD today's dose and then continue taking warfarin 1.5 tablets daily.  Stay consistent with greens each week (1 per week)  Repeat INR in 3 weeks.  Coumadin Clinic [phone]

## 2022-11-10 ENCOUNTER — Other Ambulatory Visit: Payer: Self-pay | Admitting: Cardiology

## 2022-11-10 DIAGNOSIS — I4821 Permanent atrial fibrillation: Secondary | ICD-10-CM

## 2022-11-23 ENCOUNTER — Other Ambulatory Visit: Payer: Self-pay | Admitting: Nurse Practitioner

## 2022-11-23 DIAGNOSIS — E11620 Type 2 diabetes mellitus with diabetic dermatitis: Secondary | ICD-10-CM

## 2022-11-28 ENCOUNTER — Other Ambulatory Visit: Payer: Self-pay | Admitting: Cardiology

## 2022-11-30 ENCOUNTER — Ambulatory Visit: Payer: Medicare Other | Attending: Cardiology | Admitting: Cardiology

## 2022-11-30 ENCOUNTER — Encounter: Payer: Self-pay | Admitting: Cardiology

## 2022-11-30 ENCOUNTER — Ambulatory Visit: Payer: Medicare Other | Admitting: *Deleted

## 2022-11-30 VITALS — BP 112/64 | HR 67 | Ht 71.0 in | Wt 288.8 lb

## 2022-11-30 DIAGNOSIS — I872 Venous insufficiency (chronic) (peripheral): Secondary | ICD-10-CM | POA: Insufficient documentation

## 2022-11-30 DIAGNOSIS — I4821 Permanent atrial fibrillation: Secondary | ICD-10-CM

## 2022-11-30 DIAGNOSIS — E785 Hyperlipidemia, unspecified: Secondary | ICD-10-CM | POA: Diagnosis not present

## 2022-11-30 DIAGNOSIS — Z7901 Long term (current) use of anticoagulants: Secondary | ICD-10-CM | POA: Diagnosis not present

## 2022-11-30 DIAGNOSIS — M3119 Other thrombotic microangiopathy: Secondary | ICD-10-CM | POA: Insufficient documentation

## 2022-11-30 DIAGNOSIS — I129 Hypertensive chronic kidney disease with stage 1 through stage 4 chronic kidney disease, or unspecified chronic kidney disease: Secondary | ICD-10-CM | POA: Diagnosis not present

## 2022-11-30 DIAGNOSIS — E11620 Type 2 diabetes mellitus with diabetic dermatitis: Secondary | ICD-10-CM | POA: Insufficient documentation

## 2022-11-30 DIAGNOSIS — G4733 Obstructive sleep apnea (adult) (pediatric): Secondary | ICD-10-CM | POA: Diagnosis not present

## 2022-11-30 DIAGNOSIS — N182 Chronic kidney disease, stage 2 (mild): Secondary | ICD-10-CM | POA: Diagnosis not present

## 2022-11-30 LAB — POCT INR: INR: 3.9 — AB (ref 2.0–3.0)

## 2022-11-30 NOTE — Progress Notes (Unsigned)
Cardiology Office Note:  .   Date:  12/03/2022  ID:  XENOPHON YONGUE, DOB 1945/12/07, MRN [id number] PCP: [name], FNP  [facility] Providers Cardiologist:  Bryan Lemma, MD     Chief Complaint  Patient presents with   Follow-up    Annual.  Doing well.   Atrial Fibrillation    No major issues.    Patient Profile: .     ARNEZ KOSTERMAN is a morbidly obese 77 y.o. male with a PMH notable for longstanding permanent A-fib, HTN, HLD, OSA on CPAP who presents here for annual follow-up at the request of [name], FNP.    MATHEUS SPORN was last seen on his birthday-11/29/2022 doing well.  Stable.  No changes made.  Subjective  Discussed the use of AI scribe software for clinical note transcription with the patient, who gave verbal consent to proceed.  History of Present Illness   The patient, with a history of permanent atrial fibrillation, diabetes, and hypertension, presents for a routine follow-up. He reports adherence to his current medication regimen, including Coumadin, metformin, and Lasix. He recently had his INR checked, with a result of 3.9, which is slightly higher than his usual range. He denies any changes in diet or lifestyle that could account for this increase. As a result, his Coumadin dosage was adjusted.  The patient also reports that he has been experiencing fatigue after "over-exerting" himself in the yard, which he attributes to his atrial fibrillation, but this is pretty stable. However, he denies any associated chest pain, shortness of breath, or dizziness. He also denies any symptoms suggestive of stroke, such as sudden onset weakness or changes in vision.  He denies any PND, orthopnea, or significant.   Stable mild end of day swelling.  No syncope/near syncope or TIA/amaurosis fugax or claudication.   In terms of his diabetes management, he is using a freestyle monitor and his most recent A1c was 5.8. His cholesterol levels were also  reported to be well-controlled, with a total cholesterol of 97 and LDL of 46.  The patient has a history of knee pain which had previously limited his activity level. However, he reports significant improvement following knee surgery, and he has been able to resume his yard work without pain. He also reports using velcro compression bands for leg swelling, which he finds easier to use than compression socks and effective in controlling his swelling.  Overall, the patient appears to be managing his chronic conditions well, with stable lab results and controlled symptoms. He is actively engaged in his care and is adhering to his prescribed treatments.      ROS:  Review of Systems - Negative except symptoms noted above.  Slowing down a little bit with advanced age, obesity and deconditioning.    Objective   Current Meds  Medication Sig   acetaminophen (TYLENOL) 500 MG tablet Take 500 mg by mouth daily.   ammonium lactate (AMLACTIN) 12 % lotion Apply 1 application topically as needed for dry skin.   blood glucose meter kit and supplies KIT Dispense based on patient and insurance preference. Use up to four times daily as directed. (FOR ICD-9 250.00, 250.01).   Blood Glucose Monitoring Suppl (FREESTYLE LITE) DEVI Use to test blood sugar 3 times a day. Dx code e11.65   diltiazem (CARDIZEM CD) 240 MG 24 hr capsule Take 1 capsule (240 mg total) by mouth daily.   fexofenadine (ALLEGRA) 180 MG tablet Take 1 tablet by mouth daily.  fish oil-omega-3 fatty acids 1000 MG capsule Take 1 g by mouth daily.   FREESTYLE LITE test strip TEST UP TO FOUR TIMES A DAY AS DIRECTED   furosemide (LASIX) 20 MG tablet TAKE 1 TABLET TWICE A DAY (PLEASE KEEP UPCOMING NOVEMBER APPOINTMENT FOR FURTHER REFILLS, THANK YOU)   hydrALAZINE (APRESOLINE) 50 MG tablet TAKE 1 TABLET TWICE A DAY WITH FOOD   JARDIANCE 10 MG TABS tablet TAKE 1 TABLET DAILY BEFORE BREAKFAST   Lancets (FREESTYLE) lancets TEST UP TO FOUR TIMES A DAY AS  DIRECTED   loratadine (CLARITIN) 10 MG tablet TAKE 1 TABLET DAILY   metFORMIN (GLUCOPHAGE-XR) 750 MG 24 hr tablet TAKE 1 TABLET DAILY   metoprolol tartrate (LOPRESSOR) 100 MG tablet TAKE 1 TABLET THREE TIMES A DAY   Multiple Vitamin (MULITIVITAMIN WITH MINERALS) TABS Take 1 tablet by mouth daily.   NON FORMULARY at bedtime. CPAP   pravastatin (PRAVACHOL) 80 MG tablet TAKE 1 TABLET DAILY   TRULICITY 1.5 MG/0.5ML SOAJ INJECT 1.5 MG UNDER THE SKIN ONCE A WEEK   warfarin (COUMADIN) 4 MG tablet TAKE ONE TO ONE AND ONE-HALF TABLETS DAILY AS DIRECTED PER COUMADIN CLINIC    Studies Reviewed: Marland Kitchen   EKG Interpretation Date/Time:  Friday November 30 2022 14:16:31 EST Ventricular Rate:  67 PR Interval:    QRS Duration:  82 QT Interval:  402 QTC Calculation: 424 R Axis:   -20  Text Interpretation: Atrial fibrillation Possible Inferior infarct , age undetermined When compared with ECG of 21-Jun-2012 07:44, Vent. rate has decreased BY  41 BPM Questionable change in QRS axis Nonspecific T wave abnormality no longer evident in Anterior leads Confirmed by Bryan Lemma ([id number]) on 11/30/2022 2:37:25 PM    No recent cardiac studies  LABS A1c: 5.8% (10/01/2022) Total cholesterol: 97 mg/dL (60/45/4098) HDL: 28 mg/dL (11/91/4782) LDL: 46 mg/dL (95/62/1308) Triglycerides: 127 mg/dL (65/78/4696) Creatinine: 1.29 mg/dL Potassium: 4.9 mmol/L INR: 3.4 (10/2022) INR: 3.9 (11/30/2022)  Risk Assessment/Calculations:    CHA2DS2-VASc Score = 5   This indicates a 7.2% annual risk of stroke. The patient's score is based upon: CHF History: 0 HTN History: 1 Diabetes History: 1 Stroke History: 0 Vascular Disease History: 1 Age Score: 2 Gender Score: 0   On DOAC      Physical Exam:   VS:  BP 112/64 (BP Location: Left Arm, Patient Position: Sitting, Cuff Size: Large)   Pulse 67   Ht 5\' 11"  (1.803 m)   Wt 288 lb 12.8 oz (131 kg)   SpO2 94%   BMI 40.28 kg/m    Wt Readings from Last 3 Encounters:   11/30/22 288 lb 12.8 oz (131 kg)  10/01/22 290 lb 12.8 oz (131.9 kg)  05/28/22 296 lb 9.6 oz (134.5 kg)    GEN: Well nourished, well developed in no acute distress; morbidly obese but well-groomed.  Has a somewhat ruddy complexion, normal mood and affect.  Good spirits. NECK: No JVD; No carotid bruits CARDIAC: Irregularly irregular rhythm with regular rate.  Distant but normal S1, S2; no murmurs, rubs, gallops RESPIRATORY:  Clear to auscultation without rales, wheezing or rhonchi ; nonlabored, good air movement. ABDOMEN: Soft, non-tender, non-distended EXTREMITIES: Trivial if any edema has Velcro compression sleeves on; No deformity; slow/unsteady gait.    ASSESSMENT AND PLAN: .    Problem List Items Addressed This Visit       Cardiology Problems   Benign hypertension with CKD (chronic kidney disease), stage II (Chronic)    Well controlled on current  regimen: Diltiazem to 40 mg daily, Lopressor 100 mg 3 times daily, hydralazine 50 mg twice daily. -Continue current antihypertensive therapy.      Relevant Orders   EKG 12-Lead (Completed)   Chronic venous stasis dermatitis of both lower extremities (Chronic)    Has a history of venous ablation, but continues to have swelling well-controlled with his Velcro compression bands.  Still uses 20 mg twice daily furosemide-asking for 90-day supply.  Well managed with Furosemide and compression bands. -Continue Furosemide and use of compression bands.      Relevant Orders   EKG 12-Lead (Completed)   History: TTP (thrombotic thrombocytopenic purpura) (Chronic)    Related to the use of DOAC.  On warfarin now.      Relevant Orders   EKG 12-Lead (Completed)   Permanent atrial fibrillation (HCC): CHA2DS2Vasc = 3; On Warfarin - Primary (Chronic)    Permanent atrial fibrillation-rate controlled with diltiazem 240 milium daily along with Lopressor 100 mg TID.  No issues of bradycardia.  No real significant heart failure symptoms, just a little  bit of exercise intolerance.  INR slightly elevated at 3.9, possibly due to dietary changes. No symptoms of bleeding or thromboembolism. -Adjust Coumadin dose as per anticoagulation clinic's recommendation.      Relevant Orders   EKG 12-Lead (Completed)     Other   Dyslipidemia (high LDL; low HDL) (Chronic)    Well controlled, with total cholesterol of 97 and LDL of 46 in September. Remains on Pravachol 80 mg daily-tolerating without any myalgias. -Continue current lipid-lowering therapy.      OSA (obstructive sleep apnea) (Chronic)    Continue CPAP      Severe obesity (BMI >= 40) (HCC) (Chronic)    -Continue current lifestyle modifications and physical activity.  Consider switching from Trulicity to Ozempic or [facility] for better evidence of weight loss      Type 2 diabetes mellitus with diabetic dermatitis, without long-term current use of insulin (HCC) (Chronic)     Well controlled with Metformin, as evidenced by A1c of 5.8 in September. -Continue Metformin 750 mg daily, along with Jardiance 10 mg daily and Trulicity 1.5 mg injection.             Follow-Up: Return in about 1 year (around 11/30/2023) for 1 Yr Follow-up.  Total time spent: 18 min spent with patient + 13 min spent charting = 31 min     Signed, Marykay Lex, MD, MS Bryan Lemma, M.D., M.S. Interventional Cardiologist  [name]  Pager # [phone] Phone # [phone] [address] [place], [place] [address]

## 2022-11-30 NOTE — Patient Instructions (Signed)
Description   HOLD today's dose and then START taking warfarin 1.5 tablets daily except 1 tablet on Mondays. Stay consistent with greens each week (START 1 per week)  Repeat INR in 3 weeks.  Coumadin Clinic [phone]

## 2022-11-30 NOTE — Patient Instructions (Signed)
Medication Instructions:  No changes  Continue taking medications *If you need a refill on your cardiac medications before your next appointment, please call your pharmacy*   Lab Work: None needed   Testing/Procedures: None needed    Follow-Up: At [facility], you and your health needs are our priority.  As part of our continuing mission to provide you with exceptional heart care, we have created designated Provider Care Teams.  These Care Teams include your primary Cardiologist (physician) and Advanced Practice Providers (APPs -  Physician Assistants and Nurse Practitioners) who all work together to provide you with the care you need, when you need it.    Your next appointment:   1 year(s)  Provider:   Bryan Lemma, MD

## 2022-12-03 ENCOUNTER — Encounter: Payer: Self-pay | Admitting: Cardiology

## 2022-12-03 NOTE — Assessment & Plan Note (Signed)
  Well controlled with Metformin, as evidenced by A1c of 5.8 in September. -Continue Metformin 750 mg daily, along with Jardiance 10 mg daily and Trulicity 1.5 mg injection.

## 2022-12-03 NOTE — Assessment & Plan Note (Signed)
-  Continue current lifestyle modifications and physical activity.  Consider switching from Trulicity to Ozempic or [name] for better evidence of weight loss

## 2022-12-03 NOTE — Assessment & Plan Note (Signed)
Related to the use of DOAC.  On warfarin now.

## 2022-12-03 NOTE — Assessment & Plan Note (Signed)
Continue CPAP.  

## 2022-12-03 NOTE — Assessment & Plan Note (Signed)
Well controlled on current regimen: Diltiazem to 40 mg daily, Lopressor 100 mg 3 times daily, hydralazine 50 mg twice daily. -Continue current antihypertensive therapy.

## 2022-12-03 NOTE — Assessment & Plan Note (Signed)
Well controlled, with total cholesterol of 97 and LDL of 46 in September. Remains on Pravachol 80 mg daily-tolerating without any myalgias. -Continue current lipid-lowering therapy.

## 2022-12-03 NOTE — Assessment & Plan Note (Signed)
Has a history of venous ablation, but continues to have swelling well-controlled with his Velcro compression bands.  Still uses 20 mg twice daily furosemide-asking for 90-day supply.  Well managed with Furosemide and compression bands. -Continue Furosemide and use of compression bands.

## 2022-12-03 NOTE — Assessment & Plan Note (Signed)
Permanent atrial fibrillation-rate controlled with diltiazem 240 milium daily along with Lopressor 100 mg TID.  No issues of bradycardia.  No real significant heart failure symptoms, just a little bit of exercise intolerance.  INR slightly elevated at 3.9, possibly due to dietary changes. No symptoms of bleeding or thromboembolism. -Adjust Coumadin dose as per anticoagulation clinic's recommendation.

## 2022-12-19 ENCOUNTER — Ambulatory Visit: Payer: Medicare Other | Attending: Cardiology | Admitting: *Deleted

## 2022-12-19 DIAGNOSIS — Z7901 Long term (current) use of anticoagulants: Secondary | ICD-10-CM | POA: Diagnosis not present

## 2022-12-19 DIAGNOSIS — I4821 Permanent atrial fibrillation: Secondary | ICD-10-CM | POA: Diagnosis not present

## 2022-12-19 LAB — POCT INR: INR: 2.1 (ref 2.0–3.0)

## 2022-12-19 NOTE — Patient Instructions (Signed)
Description   Continue taking warfarin 1.5 tablets daily except 1 tablet on Mondays. Stay consistent with greens each week (Continue 1 per week)  Repeat INR in 4 weeks.  [facility] [phone]

## 2022-12-20 ENCOUNTER — Telehealth: Payer: Self-pay | Admitting: Cardiology

## 2022-12-20 MED ORDER — FUROSEMIDE 20 MG PO TABS: 20.0000 mg | ORAL_TABLET | Freq: Two times a day (BID) | ORAL | 3 refills | Status: DC

## 2022-12-20 NOTE — Telephone Encounter (Signed)
*  STAT* If patient is at the pharmacy, call can be transferred to refill team.   1. Which medications need to be refilled? (please list name of each medication and dose if known) w prescription for Furosemide   2. Would you like to learn more about the convenience, safety, & potential cost savings by using the [facility]?     3. Are you open to using the [facility] (Type [facility].    4. Which pharmacy/location (including street and city if local pharmacy) is medication to be sent to?[facility]   5. Do they need a 30 day or 90 day supply? 90 days# 180  and refills

## 2022-12-20 NOTE — Telephone Encounter (Signed)
Refilled faxed to the pharmacy.

## 2022-12-24 ENCOUNTER — Other Ambulatory Visit: Payer: Self-pay | Admitting: Nurse Practitioner

## 2023-01-16 ENCOUNTER — Ambulatory Visit: Payer: Medicare Other | Attending: Internal Medicine | Admitting: *Deleted

## 2023-01-16 ENCOUNTER — Other Ambulatory Visit: Payer: Self-pay | Admitting: Nurse Practitioner

## 2023-01-16 DIAGNOSIS — Z7901 Long term (current) use of anticoagulants: Secondary | ICD-10-CM | POA: Diagnosis not present

## 2023-01-16 DIAGNOSIS — I4821 Permanent atrial fibrillation: Secondary | ICD-10-CM | POA: Insufficient documentation

## 2023-01-16 LAB — POCT INR: INR: 2.3 (ref 2.0–3.0)

## 2023-01-16 NOTE — Patient Instructions (Signed)
 Description   Continue taking warfarin 1.5 tablets daily except 1 tablet on Mondays. Stay consistent with greens each week (Continue 1 per week)  Repeat INR in 5 weeks.  [facility] [phone]

## 2023-01-31 ENCOUNTER — Ambulatory Visit: Payer: Medicare Other | Admitting: Nurse Practitioner

## 2023-02-13 ENCOUNTER — Ambulatory Visit: Payer: Medicare Other

## 2023-02-13 ENCOUNTER — Ambulatory Visit: Payer: Medicare Other | Admitting: Nurse Practitioner

## 2023-02-13 ENCOUNTER — Encounter: Payer: Self-pay | Admitting: Nurse Practitioner

## 2023-02-13 VITALS — BP 128/80 | HR 64 | Temp 97.4°F | Ht 71.0 in | Wt 298.8 lb

## 2023-02-13 VITALS — BP 128/80 | HR 64 | Temp 97.4°F | Ht 71.0 in | Wt 298.0 lb

## 2023-02-13 DIAGNOSIS — E782 Mixed hyperlipidemia: Secondary | ICD-10-CM

## 2023-02-13 DIAGNOSIS — I4821 Permanent atrial fibrillation: Secondary | ICD-10-CM

## 2023-02-13 DIAGNOSIS — E11620 Type 2 diabetes mellitus with diabetic dermatitis: Secondary | ICD-10-CM

## 2023-02-13 DIAGNOSIS — Z23 Encounter for immunization: Secondary | ICD-10-CM | POA: Diagnosis not present

## 2023-02-13 DIAGNOSIS — I10 Essential (primary) hypertension: Secondary | ICD-10-CM | POA: Diagnosis not present

## 2023-02-13 DIAGNOSIS — Z0000 Encounter for general adult medical examination without abnormal findings: Secondary | ICD-10-CM

## 2023-02-13 LAB — LIPID PANEL
Chol/HDL Ratio: 3.8 {ratio} (ref 0.0–5.0)
Cholesterol, Total: 98 mg/dL — ABNORMAL LOW (ref 100–199)
HDL: 26 mg/dL — ABNORMAL LOW (ref >39)
LDL Chol Calc (NIH): 49 mg/dL (ref 0–99)
Triglycerides: 127 mg/dL (ref 0–149)
VLDL Cholesterol Cal: 23 mg/dL (ref 5–40)

## 2023-02-13 LAB — BMP8+EGFR
BUN/Creatinine Ratio: 16 (ref 10–24)
BUN: 18 mg/dL (ref 8–27)
CO2: 23 mmol/L (ref 20–29)
Calcium: 9.2 mg/dL (ref 8.6–10.2)
Chloride: 99 mmol/L (ref 96–106)
Creatinine, Ser: 1.12 mg/dL (ref 0.76–1.27)
Glucose: 100 mg/dL — ABNORMAL HIGH (ref 70–99)
Potassium: 4.2 mmol/L (ref 3.5–5.2)
Sodium: 139 mmol/L (ref 134–144)
eGFR: 67 mL/min/{1.73_m2} (ref >59)

## 2023-02-13 LAB — HEMOGLOBIN A1C
Est. average glucose Bld gHb Est-mCnc: 120 mg/dL
Hgb A1c MFr Bld: 5.8 % — ABNORMAL HIGH (ref 4.8–5.6)

## 2023-02-13 MED ORDER — TETANUS-DIPHTH-ACELL PERTUSSIS 5-2.5-18.5 LF-MCG/0.5 IM SUSP
0.5000 mL | Freq: Once | INTRAMUSCULAR | 0 refills | Status: AC
Start: 2023-02-13 — End: 2023-02-13

## 2023-02-13 NOTE — Progress Notes (Addendum)
 Subjective:   Brett [name] is a 78 y.o. male who presents for Medicare Annual/Subsequent preventive examination.  Visit Complete: In person  Patient Medicare AWV questionnaire was completed by the patient on 02/09/2023; I have confirmed that all information answered by patient is correct and no changes since this date.  Cardiac Risk Factors include: advanced age (>76men, >67 women);diabetes mellitus;dyslipidemia;hypertension;male gender     Objective:    Today's Vitals   02/13/23 1029  BP: 128/80  Pulse: 64  Temp: (!) 97.4 F (36.3 C)  TempSrc: Oral  SpO2: 94%  Weight: 298 lb 12.8 oz (135.5 kg)  Height: 5' 11 (1.803 m)   Body mass index is 41.67 kg/m.     02/13/2023   10:36 AM 01/24/2022    9:56 AM 01/12/2021    8:38 AM 01/13/2020    8:44 AM 11/27/2018   10:54 AM 11/21/2017   11:35 AM 06/11/2012   11:00 AM  Advanced Directives  Does Patient Have a Medical Advance Directive? No No Yes Yes Yes No Patient does not have advance directive  Type of [employer] of [employer] of [place];Living will Healthcare Power of [place];Living will    Copy of Healthcare Power of Attorney in Chart?   No - copy requested No - copy requested No - copy requested    Would patient like information on creating a medical advance directive? No - Patient declined No - Patient declined    Yes (MAU/Ambulatory/Procedural Areas - Information given)     Current Medications (verified) Outpatient Encounter Medications as of 02/13/2023  Medication Sig   acetaminophen  (TYLENOL ) 500 MG tablet Take 500 mg by mouth daily.   ammonium lactate  (AMLACTIN) 12 % lotion Apply 1 application topically as needed for dry skin.   blood glucose meter kit and supplies KIT Dispense based on patient and insurance preference. Use up to four times daily as directed. (FOR ICD-9 250.00, 250.01).   Blood Glucose Monitoring Suppl (FREESTYLE LITE) DEVI Use to test blood sugar 3 times a day. Dx  code e11.65   diltiazem  (CARDIZEM  CD) 240 MG 24 hr capsule Take 1 capsule (240 mg total) by mouth daily.   fexofenadine  (ALLEGRA ) 180 MG tablet Take 1 tablet by mouth daily.   fish oil-omega-3 fatty acids 1000 MG capsule Take 1 g by mouth daily.   FREESTYLE LITE test strip TEST UP TO FOUR TIMES A DAY AS DIRECTED   furosemide  (LASIX ) 20 MG tablet Take 1 tablet (20 mg total) by mouth 2 (two) times daily.   hydrALAZINE  (APRESOLINE ) 50 MG tablet TAKE 1 TABLET TWICE A DAY WITH FOOD   JARDIANCE  10 MG TABS tablet TAKE 1 TABLET DAILY BEFORE BREAKFAST   Lancets (FREESTYLE) lancets TEST UP TO FOUR TIMES A DAY AS DIRECTED   loratadine (CLARITIN) 10 MG tablet TAKE 1 TABLET DAILY   metFORMIN (GLUCOPHAGE-XR) 750 MG 24 hr tablet TAKE 1 TABLET DAILY   metoprolol  tartrate (LOPRESSOR ) 100 MG tablet TAKE 1 TABLET THREE TIMES A DAY   Multiple Vitamin (MULITIVITAMIN WITH MINERALS) TABS Take 1 tablet by mouth daily.   NON FORMULARY at bedtime. CPAP   pravastatin (PRAVACHOL) 80 MG tablet TAKE 1 TABLET DAILY   TRULICITY  1.5 MG/0.5ML SOAJ INJECT 1.5 MG UNDER THE SKIN ONCE A WEEK   warfarin (COUMADIN ) 4 MG tablet TAKE ONE TO ONE AND ONE-HALF TABLETS DAILY AS DIRECTED PER COUMADIN  CLINIC   No facility-administered encounter medications on file as of 02/13/2023.  Allergies (verified) Patient has no known allergies.   History: Past Medical History:  Diagnosis Date   Cellulitis of right lower extremity 05/22/2018   DVT of lower extremity, bilateral (HCC) 06/17/2012   Right CFV and popliteal vein, left C. it the only;  also interstitial fluid noted in both calfs   Dyslipidemia    History of: Bacteremia due to coagulase-negative Staphylococcus 06/13/2012   During hospitalization May 30 through 06/19/2012    History: TTP (thrombotic thrombocytopenic purpura) 06/11/2012   Related to DOAC   Hypertension    Obesity (BMI 30-39.9)    OSA on CPAP    Permanent atrial fibrillation (HCC) 02/09/2011   Negative Myoview ;  Normal EF by Echo   Severe sepsis with acute organ dysfunction (HCC) 5/30-6/12/2012   Prolonged hospitalization for presumed sepsis and septic shock compounded by coagulopathy thought to be TTP versus Pradaxa mediated. Temporarily on hemodialysis and plasmapheresis.   Sleep apnea    Stage 2 chronic kidney disease 10/01/2022   Type 2 diabetes mellitus without complication, without long-term current use of insulin  (HCC) 09/09/2019   Past Surgical History:  Procedure Laterality Date   CARDIOVERSION  06/08/2011   Procedure: CARDIOVERSION;  Surgeon: Alm LELON Clay, MD;  Location: [facility] OR;  Service: Cardiovascular;  Laterality: N/A;   EYE SURGERY     JOINT REPLACEMENT     NM MYOVIEW  LTD  02/07/2011   lexiscan ; mild ischemia in mid inferolateral & apical lateral regions; low risk scan   SKIN SURGERY Left    left side of face 11/2020   TOTAL KNEE ARTHROPLASTY     Left knee 2010, Right knee 2011   TRANSTHORACIC ECHOCARDIOGRAM  02/07/2011   EF 50-60%; RV mildly dilated, LA moderately dilated; mild MR; mild-mod TR; RV systolic pressure elevated; mildly sclerotic AV   Family History  Problem Relation Age of Onset   Heart failure Mother 47   Cancer Father        stomach   Social History   Socioeconomic History   Marital status: Divorced    Spouse name: Not on file   Number of children: 1   Years of education: Not on file   Highest education level: Associate degree: academic program  Occupational History   Occupation: retired  Tobacco Use   Smoking status: Former    Current packs/day: 0.00    Types: Cigars, Cigarettes    Quit date: 05/18/1969    Years since quitting: 53.7   Smokeless tobacco: Never  Vaping Use   Vaping status: Never Used  Substance and Sexual Activity   Alcohol use: No    Comment: couple beers a year   Drug use: No   Sexual activity: Not Currently  Other Topics Concern   Not on file  Social History Narrative   He is a divorced father of one. Prior to this  hospitalization he was walking 5-10 minutes a day 4 times a week. He now is gradually building up his rehabilitation doing mild exercise daily.      He quit smoking in 1968 and rarely has alcohol.   Social Drivers of Corporate Investment Banker Strain: Low Risk  (02/13/2023)   Overall Financial Resource Strain (CARDIA)    Difficulty of Paying Living Expenses: Not hard at all  Food Insecurity: No Food Insecurity (02/13/2023)   Hunger Vital Sign    Worried About Running Out of Food in the Last Year: Never true    Ran Out of Food in the Last Year: Never  true  Transportation Needs: No Transportation Needs (02/13/2023)   PRAPARE - Administrator, Civil Service (Medical): No    Lack of Transportation (Non-Medical): No  Physical Activity: Inactive (02/13/2023)   Exercise Vital Sign    Days of Exercise per Week: 0 days    Minutes of Exercise per Session: 0 min  Stress: No Stress Concern Present (02/13/2023)   [employer] of Occupational Health - Occupational Stress Questionnaire    Feeling of Stress : Not at all  Social Connections: Moderately Isolated (02/13/2023)   Social Connection and Isolation Panel [NHANES]    Frequency of Communication with Friends and Family: More than three times a week    Frequency of Social Gatherings with Friends and Family: Three times a week    Attends Religious Services: Never    Active Member of Clubs or Organizations: No    Attends Banker Meetings: Never    Marital Status: Married    Tobacco Counseling Counseling given: Not Answered   Clinical Intake:  Pre-visit preparation completed: Yes  Pain : No/denies pain     Nutritional Status: BMI > 30  Obese Nutritional Risks: None Diabetes: Yes CBG done?: No Did pt. bring in CBG monitor from home?: No  How often do you need to have someone help you when you read instructions, pamphlets, or other written materials from your doctor or pharmacy?: 1 - Never  Interpreter Needed?:  No  Information entered by :: NAllen LPN   Activities of Daily Living    02/09/2023    1:57 PM  In your present state of health, do you have any difficulty performing the following activities:  Hearing? 0  Vision? 0  Difficulty concentrating or making decisions? 0  Walking or climbing stairs? 1  Dressing or bathing? 0  Doing errands, shopping? 0  Preparing Food and eating ? N  Using the Toilet? N  In the past six months, have you accidently leaked urine? N  Do you have problems with loss of bowel control? N  Managing your Medications? N  Managing your Finances? N  Housekeeping or managing your Housekeeping? N    Patient Care Team: Georgina Speaks, FNP as PCP - General (General Practice) Anner Alm ORN, MD as PCP - Cardiology (Cardiology) Wilkie Majel RAMAN, FNP as Nurse Practitioner (Psychiatry)  Indicate any recent Medical Services you may have received from other than [facility] providers in the past year (date may be approximate).     Assessment:   This is a routine wellness examination for [place].  Hearing/Vision screen Hearing Screening - Comments:: Denies hearing issues Vision Screening - Comments:: Regular eye exams, [facility]   Goals Addressed             This Visit's Progress    Patient Stated       02/13/2023, stay healthy, stay alive and don't fall       Depression Screen    02/13/2023   10:37 AM 05/28/2022    2:30 PM 01/24/2022    9:56 AM 09/25/2021    3:43 PM 01/12/2021    8:39 AM 01/13/2020    8:46 AM 05/28/2019   11:23 AM  PHQ 2/9 Scores  PHQ - 2 Score 0 0 0 0 0 0 0    Fall Risk    02/09/2023    1:57 PM 05/28/2022    2:29 PM 01/24/2022    9:56 AM 09/25/2021    3:43 PM 01/12/2021    8:38 AM  Fall Risk   Falls in the past year? 0 0 0 0 0  Number falls in past yr: 0 0 0 0   Injury with Fall? 0 0 0 0   Risk for fall due to : Medication side effect No Fall Risks Medication side effect;Impaired mobility;Impaired balance/gait No Fall Risks  Medication side effect;Impaired balance/gait  Follow up Falls prevention discussed;Falls evaluation completed Falls evaluation completed Falls prevention discussed;Education provided;Falls evaluation completed Falls evaluation completed Falls evaluation completed;Education provided;Falls prevention discussed    MEDICARE RISK AT HOME: Medicare Risk at Home Any stairs in or around the home?: (Patient-Rptd) Yes If so, are there any without handrails?: (Patient-Rptd) No Home free of loose throw rugs in walkways, pet beds, electrical cords, etc?: (Patient-Rptd) Yes Adequate lighting in your home to reduce risk of falls?: (Patient-Rptd) Yes Life alert?: (Patient-Rptd) No Use of a cane, walker or w/c?: (Patient-Rptd) Yes Grab bars in the bathroom?: (Patient-Rptd) Yes Shower chair or bench in shower?: (Patient-Rptd) No Elevated toilet seat or a handicapped toilet?: (Patient-Rptd) No  TIMED UP AND GO:  Was the test performed?  Yes  Length of time to ambulate 10 feet: 6 sec Gait slow and steady with assistive device    Cognitive Function:        02/13/2023   10:37 AM 01/24/2022    9:57 AM 01/12/2021    8:40 AM 01/13/2020    8:47 AM 11/27/2018   10:57 AM  6CIT Screen  What Year? 0 points 0 points 0 points 0 points 0 points  What month? 0 points 0 points 0 points 0 points 0 points  What time? 0 points 0 points 0 points 0 points 0 points  Count back from 20 0 points 0 points 0 points 0 points 0 points  Months in reverse 0 points 2 points 0 points 0 points 0 points  Repeat phrase 0 points 0 points 2 points 2 points 4 points  Total Score 0 points 2 points 2 points 2 points 4 points    Immunizations Immunization History  Administered Date(s) Administered   Fluad Quad(high Dose 65+) 09/09/2019, 01/12/2021, 09/25/2021   Influenza, High Dose Seasonal PF 11/27/2018   Influenza-Unspecified 12/08/2012   PFIZER(Purple Top)SARS-COV-2 Vaccination 02/22/2019, 03/24/2019   PNEUMOCOCCAL CONJUGATE-20  02/15/2021   Tdap 09/15/2012    TDAP status: Due, Education has been provided regarding the importance of this vaccine. Advised may receive this vaccine at local pharmacy or Health Dept. Aware to provide a copy of the vaccination record if obtained from local pharmacy or Health Dept. Verbalized acceptance and understanding.  Flu Vaccine status: Declined, Education has been provided regarding the importance of this vaccine but patient still declined. Advised may receive this vaccine at local pharmacy or Health Dept. Aware to provide a copy of the vaccination record if obtained from local pharmacy or Health Dept. Verbalized acceptance and understanding.  Pneumococcal vaccine status: Up to date  Covid-19 vaccine status: Information provided on how to obtain vaccines.   Qualifies for Shingles Vaccine? Yes   Zostavax completed No   Shingrix  Completed?: No.    Education has been provided regarding the importance of this vaccine. Patient has been advised to call insurance company to determine out of pocket expense if they have not yet received this vaccine. Advised may also receive vaccine at local pharmacy or Health Dept. Verbalized acceptance and understanding.  Screening Tests Health Maintenance  Topic Date Due   Zoster Vaccines- Shingrix  (1 of 2) Never done   COVID-19 Vaccine (  3 - Pfizer risk series) 04/21/2019   FOOT EXAM  02/15/2022   DTaP/Tdap/Td (2 - Td or Tdap) 09/16/2022   INFLUENZA VACCINE  04/08/2023 (Originally 08/09/2022)   HEMOGLOBIN A1C  03/31/2023   Diabetic kidney evaluation - eGFR measurement  10/01/2023   Diabetic kidney evaluation - Urine ACR  10/03/2023   OPHTHALMOLOGY EXAM  11/06/2023   Medicare Annual Wellness (AWV)  02/13/2024   Pneumonia Vaccine 7+ Years old  Completed   Hepatitis C Screening  Completed   HPV VACCINES  Aged Out   Colonoscopy  Discontinued    Health Maintenance  Health Maintenance Due  Topic Date Due   Zoster Vaccines- Shingrix  (1 of 2) Never  done   COVID-19 Vaccine (3 - Pfizer risk series) 04/21/2019   FOOT EXAM  02/15/2022   DTaP/Tdap/Td (2 - Td or Tdap) 09/16/2022    Colorectal cancer screening: No longer required.   Lung Cancer Screening: (Low Dose CT Chest recommended if Age 64-80 years, 20 pack-year currently smoking OR have quit w/in 15years.) does not qualify.   Lung Cancer Screening Referral: no  Additional Screening:  Hepatitis C Screening: does qualify; Completed 05/22/2018  Vision Screening: Recommended annual ophthalmology exams for early detection of glaucoma and other disorders of the eye. Is the patient up to date with their annual eye exam?  Yes  Who is the provider or what is the name of the office in which the patient attends annual eye exams? [facility] If pt is not established with a provider, would they like to be referred to a provider to establish care? No .   Dental Screening: Recommended annual dental exams for proper oral hygiene  Diabetic Foot Exam: Diabetic Foot Exam: Overdue, Pt has been advised about the importance in completing this exam. Pt is scheduled for diabetic foot exam on next appointment.  Community Resource Referral / Chronic Care Management: CRR required this visit?  No   CCM required this visit?  No     Plan:     I have personally reviewed and noted the following in the patient's chart:   Medical and social history Use of alcohol, tobacco or illicit drugs  Current medications and supplements including opioid prescriptions. Patient is not currently taking opioid prescriptions. Functional ability and status Nutritional status Physical activity Advanced directives List of other physicians Hospitalizations, surgeries, and ER visits in previous 12 months Vitals Screenings to include cognitive, depression, and falls Referrals and appointments  In addition, I have reviewed and discussed with patient certain preventive protocols, quality metrics, and best practice  recommendations. A written personalized care plan for preventive services as well as general preventive health recommendations were provided to patient.     Ardella FORBES [name], LPN   07/11/7972   After Visit Summary: (In Person-Printed) AVS printed and given to the patient  Nurse Notes: none

## 2023-02-13 NOTE — Patient Instructions (Signed)
 [name] , Thank you for taking time to come for your Medicare Wellness Visit. I appreciate your ongoing commitment to your health goals. Please review the following plan we discussed and let me know if I can assist you in the future.   Referrals/Orders/Follow-Ups/Clinician Recommendations: none  This is a list of the screening recommended for you and due dates:  Health Maintenance  Topic Date Due   Zoster (Shingles) Vaccine (1 of 2) Never done   COVID-19 Vaccine (3 - Pfizer risk series) 04/21/2019   Complete foot exam   02/15/2022   DTaP/Tdap/Td vaccine (2 - Td or Tdap) 09/16/2022   Flu Shot  04/08/2023*   Hemoglobin A1C  03/31/2023   Yearly kidney function blood test for diabetes  10/01/2023   Yearly kidney health urinalysis for diabetes  10/03/2023   Eye exam for diabetics  11/06/2023   Medicare Annual Wellness Visit  02/13/2024   Pneumonia Vaccine  Completed   Hepatitis C Screening  Completed   HPV Vaccine  Aged Out   Colon Cancer Screening  Discontinued  *Topic was postponed. The date shown is not the original due date.    Advanced directives: (Declined) Advance directive discussed with you today. Even though you declined this today, please call our office should you change your mind, and we can give you the proper paperwork for you to fill out.  Next Medicare Annual Wellness Visit scheduled for next year: Yes  insert Preventive Care attachment Insert FALL PREVENTION attachment if needed

## 2023-02-13 NOTE — Progress Notes (Signed)
 LILLETTE Kristeen JINNY Gladis, CMA,acting as a neurosurgeon for Gaines Ada, FNP.,have documented all relevant documentation on the behalf of Gaines Ada, FNP,as directed by  Gaines Ada, FNP while in the presence of Gaines Ada, FNP.  Subjective:  Patient ID: Brett [name] , male    DOB: 1945-05-02 , 78 y.o.   MRN: [id number]  Chief Complaint  Patient presents with   Hypertension    HPI  Patient presents today for a bp and dm follow up, Patient reports compliance with medication. Patient denies any chest pain, SOB, or headaches. Patient has no concerns today.   Diabetes He presents for his follow-up diabetic visit. He has type 2 diabetes mellitus. His disease course has been stable. Pertinent negatives for hypoglycemia include no dizziness or headaches. Pertinent negatives for diabetes include no blurred vision and no chest pain. There are no hypoglycemic complications. Diabetic complications include nephropathy. (Skin dermatitis) Risk factors for coronary artery disease include sedentary lifestyle, obesity, male sex, hypertension and diabetes mellitus. Current diabetic treatment includes oral agent (dual therapy). He is compliant with treatment all of the time. He is following a generally unhealthy diet. When asked about meal planning, he reported none. He has not had a previous visit with a dietitian. He rarely participates in exercise. There is no change in his home blood glucose trend. (Reports his blood sugars are normal ) He does not see a podiatrist.Eye exam current: scheduled with Dr. Octavia next month.  Hypertension This is a chronic problem. The current episode started more than 1 year ago. The problem has been rapidly improving since onset. The problem is uncontrolled. Pertinent negatives include no anxiety, blurred vision, chest pain, headaches or palpitations. There are no associated agents to hypertension. Risk factors for coronary artery disease include obesity and male gender. Past treatments  include ACE inhibitors and alpha 1 blockers. The current treatment provides moderate improvement. There are no compliance problems.  There is no history of angina or kidney disease. There is no history of chronic renal disease.     Past Medical History:  Diagnosis Date   Cellulitis of right lower extremity 05/22/2018   DVT of lower extremity, bilateral (HCC) 06/17/2012   Right CFV and popliteal vein, left C. it the only;  also interstitial fluid noted in both calfs   Dyslipidemia    History of: Bacteremia due to coagulase-negative Staphylococcus 06/13/2012   During hospitalization May 30 through 06/19/2012    History: TTP (thrombotic thrombocytopenic purpura) 06/11/2012   Related to DOAC   Hypertension    Obesity (BMI 30-39.9)    OSA on CPAP    Permanent atrial fibrillation (HCC) 02/09/2011   Negative Myoview ; Normal EF by Echo   Severe sepsis with acute organ dysfunction (HCC) 5/30-6/12/2012   Prolonged hospitalization for presumed sepsis and septic shock compounded by coagulopathy thought to be TTP versus Pradaxa mediated. Temporarily on hemodialysis and plasmapheresis.   Sleep apnea    Stage 2 chronic kidney disease 10/01/2022   Type 2 diabetes mellitus without complication, without long-term current use of insulin  (HCC) 09/09/2019     Family History  Problem Relation Age of Onset   Heart failure Mother 70   Cancer Father        stomach     Current Outpatient Medications:    acetaminophen  (TYLENOL ) 500 MG tablet, Take 500 mg by mouth daily., Disp: , Rfl:    ammonium lactate  (AMLACTIN) 12 % lotion, Apply 1 application topically as needed for dry skin., Disp: 400 g, Rfl:  3   blood glucose meter kit and supplies KIT, Dispense based on patient and insurance preference. Use up to four times daily as directed. (FOR ICD-9 250.00, 250.01)., Disp: 1 each, Rfl: 0   Blood Glucose Monitoring Suppl (FREESTYLE LITE) DEVI, Use to test blood sugar 3 times a day. Dx code e11.65, Disp: 1 each,  Rfl: 3   diltiazem  (CARDIZEM  CD) 240 MG 24 hr capsule, Take 1 capsule (240 mg total) by mouth daily., Disp: 90 capsule, Rfl: 3   fexofenadine  (ALLEGRA ) 180 MG tablet, Take 1 tablet by mouth daily., Disp: , Rfl:    fish oil-omega-3 fatty acids 1000 MG capsule, Take 1 g by mouth daily., Disp: , Rfl:    FREESTYLE LITE test strip, TEST UP TO FOUR TIMES A DAY AS DIRECTED, Disp: 300 strip, Rfl: 3   furosemide  (LASIX ) 20 MG tablet, Take 1 tablet (20 mg total) by mouth 2 (two) times daily., Disp: 180 tablet, Rfl: 3   hydrALAZINE  (APRESOLINE ) 50 MG tablet, TAKE 1 TABLET TWICE A DAY WITH FOOD, Disp: 180 tablet, Rfl: 3   JARDIANCE  10 MG TABS tablet, TAKE 1 TABLET DAILY BEFORE BREAKFAST, Disp: 90 tablet, Rfl: 3   Lancets (FREESTYLE) lancets, TEST UP TO FOUR TIMES A DAY AS DIRECTED, Disp: 300 each, Rfl: 3   loratadine (CLARITIN) 10 MG tablet, TAKE 1 TABLET DAILY, Disp: 90 tablet, Rfl: 3   metFORMIN (GLUCOPHAGE-XR) 750 MG 24 hr tablet, TAKE 1 TABLET DAILY, Disp: 90 tablet, Rfl: 3   metoprolol  tartrate (LOPRESSOR ) 100 MG tablet, TAKE 1 TABLET THREE TIMES A DAY, Disp: 270 tablet, Rfl: 3   Multiple Vitamin (MULITIVITAMIN WITH MINERALS) TABS, Take 1 tablet by mouth daily., Disp: , Rfl:    NON FORMULARY, at bedtime. CPAP, Disp: , Rfl:    pravastatin (PRAVACHOL) 80 MG tablet, TAKE 1 TABLET DAILY, Disp: 90 tablet, Rfl: 3   TRULICITY  1.5 MG/0.5ML SOAJ, INJECT 1.5 MG UNDER THE SKIN ONCE A WEEK, Disp: 6 mL, Rfl: 3   warfarin (COUMADIN ) 4 MG tablet, TAKE ONE TO ONE AND ONE-HALF TABLETS DAILY AS DIRECTED PER COUMADIN  CLINIC, Disp: 140 tablet, Rfl: 3   No Known Allergies   Review of Systems  Constitutional: Negative.   HENT: Negative.    Eyes: Negative.  Negative for blurred vision.  Respiratory: Negative.    Cardiovascular: Negative.  Negative for chest pain and palpitations.  Gastrointestinal: Negative.   Endocrine: Negative.   Genitourinary: Negative.   Musculoskeletal: Negative.   Skin: Negative.    Allergic/Immunologic: Negative.   Neurological: Negative.  Negative for dizziness and headaches.  Hematological: Negative.   Psychiatric/Behavioral: Negative.       Today's Vitals   02/13/23 1050  BP: 128/80  Pulse: 64  Temp: (!) 97.4 F (36.3 C)  TempSrc: Oral  Weight: 298 lb (135.2 kg)  Height: 5' 11 (1.803 m)  PainSc: 0-No pain   Body mass index is 41.56 kg/m.  Wt Readings from Last 3 Encounters:  02/13/23 298 lb (135.2 kg)  02/13/23 298 lb 12.8 oz (135.5 kg)  11/30/22 288 lb 12.8 oz (131 kg)     Objective:  Physical Exam Vitals reviewed.  Constitutional:      General: He is not in acute distress.    Appearance: Normal appearance. He is obese.     Comments: Central obesity  Cardiovascular:     Rate and Rhythm: Normal rate and regular rhythm.     Pulses: Normal pulses.     Heart sounds: Normal heart sounds.  No murmur heard. Pulmonary:     Effort: Pulmonary effort is normal. No respiratory distress.     Breath sounds: Normal breath sounds. No wheezing.  Musculoskeletal:        General: No swelling.     Right lower leg: Edema (trace edema - much improved with his wraps) present.     Left lower leg: Edema (trace edema - wearing wraps) present.     Comments: Using a cane to ambulate. He is wearing compressive garments to lower extremities  Skin:    General: Skin is warm and dry.     Capillary Refill: Capillary refill takes less than 2 seconds.     Findings: No rash.     Comments: Lower extremities are reddened with scaly skin, firm to touch  Neurological:     General: No focal deficit present.     Mental Status: He is alert and oriented to person, place, and time.     Cranial Nerves: No cranial nerve deficit.     Motor: No weakness.  Psychiatric:        Mood and Affect: Mood normal.        Behavior: Behavior normal.        Thought Content: Thought content normal.        Judgment: Judgment normal.     Assessment And Plan:  Type 2 diabetes mellitus with  diabetic dermatitis, without long-term current use of insulin  (HCC) Assessment & Plan: Hemoglobin A1c slightly improved.  Continue Trulicity  and metformin.  Will check HgbA1c.   Orders: -     BMP8+eGFR -     Hemoglobin A1c  Essential hypertension Assessment & Plan: Blood pressure is well controlled, continue current medications.   Orders: -     BMP8+eGFR  Mixed hyperlipidemia Assessment & Plan: Cholesterol levels are stable. Continue statin.   Orders: -     Lipid panel  Permanent atrial fibrillation [facility]) Assessment & Plan: Continue follow-up with Coumadin  clinic   Need for Tdap vaccination Assessment & Plan: Rx sent to pharmacy, checked transrx.   Orders: -     Tetanus-Diphth-Acell Pertussis; Inject 0.5 mLs into the muscle once for 1 dose.  Dispense: 0.5 mL; Refill: 0  Severe obesity (BMI >= 40) (HCC) Assessment & Plan: he is encouraged to strive for BMI less than 30 to decrease cardiac risk. Advised to aim for at least 150 minutes of exercise per week.      Return for controlled DM check 4 months.  Patient was given opportunity to ask questions. Patient verbalized understanding of the plan and was able to repeat key elements of the plan. All questions were answered to their satisfaction.    LILLETTE Gaines Ada, FNP, have reviewed all documentation for this visit. The documentation on 02/13/23 for the exam, diagnosis, procedures, and orders are all accurate and complete.   IF YOU HAVE BEEN REFERRED TO A SPECIALIST, IT MAY TAKE 1-2 WEEKS TO SCHEDULE/PROCESS THE REFERRAL. IF YOU HAVE NOT HEARD FROM US /SPECIALIST IN TWO WEEKS, PLEASE GIVE US  A CALL AT [phone].

## 2023-02-13 NOTE — Addendum Note (Signed)
 Addended by: Rasheem Figiel E on: 02/13/2023 10:51 AM   Modules accepted: Orders

## 2023-02-15 ENCOUNTER — Telehealth: Payer: Self-pay | Admitting: *Deleted

## 2023-02-15 NOTE — Progress Notes (Signed)
 Complex Care Management Note  Care Guide Note 02/15/2023 Name: Brett [name] MRN: [id number] DOB: 1945/08/26  Brett [name] is a 78 y.o. year old male who sees Georgina Speaks, FNP for primary care. I reached out to Brett [name] by phone today to offer complex care management services.  Mr. Heft was given information about Complex Care Management services today including:   The Complex Care Management services include support from the care team which includes your Nurse Care Manager, Clinical Social Worker, or Pharmacist.  The Complex Care Management team is here to help remove barriers to the health concerns and goals most important to you. Complex Care Management services are voluntary, and the patient may decline or stop services at any time by request to their care team member.   Complex Care Management Consent Status: Patient did not agree to participate in complex care management services at this time.  Encounter Outcome:  Patient Refused Harlene Satterfield  [facility], [facility], [facility]  [facility], [facility] Guide  Direct Dial: [phone]  Fax [phone]

## 2023-02-20 ENCOUNTER — Ambulatory Visit: Payer: Medicare Other | Attending: Cardiology

## 2023-02-20 DIAGNOSIS — Z7901 Long term (current) use of anticoagulants: Secondary | ICD-10-CM | POA: Insufficient documentation

## 2023-02-20 LAB — POCT INR: INR: 2.1 (ref 2.0–3.0)

## 2023-02-20 NOTE — Patient Instructions (Signed)
Description   Continue taking warfarin 1.5 tablets daily except 1 tablet on Mondays.  Stay consistent with greens each week (Continue 1 per week)  Repeat INR in 6 weeks.  C[facility]madin Clinic [phone]

## 2023-02-24 ENCOUNTER — Encounter: Payer: Self-pay | Admitting: Nurse Practitioner

## 2023-02-24 DIAGNOSIS — E782 Mixed hyperlipidemia: Secondary | ICD-10-CM | POA: Insufficient documentation

## 2023-02-24 DIAGNOSIS — Z23 Encounter for immunization: Secondary | ICD-10-CM | POA: Insufficient documentation

## 2023-02-24 DIAGNOSIS — I10 Essential (primary) hypertension: Secondary | ICD-10-CM | POA: Insufficient documentation

## 2023-02-24 NOTE — Assessment & Plan Note (Signed)
 Hemoglobin A1c slightly improved.  Continue Trulicity and metformin.  Will check HgbA1c.

## 2023-02-24 NOTE — Assessment & Plan Note (Signed)
 Cholesterol levels are stable. Continue statin

## 2023-02-24 NOTE — Assessment & Plan Note (Signed)
 Rx sent to pharmacy, checked transrx.

## 2023-02-24 NOTE — Assessment & Plan Note (Signed)
 Blood pressure is well controlled, continue current medications.

## 2023-02-24 NOTE — Assessment & Plan Note (Signed)
 he is encouraged to strive for BMI less than 30 to decrease cardiac risk. Advised to aim for at least 150 minutes of exercise per week.

## 2023-02-24 NOTE — Assessment & Plan Note (Signed)
Continue follow-up with Coumadin clinic 

## 2023-02-25 ENCOUNTER — Other Ambulatory Visit: Payer: Self-pay | Admitting: Nurse Practitioner

## 2023-04-02 DIAGNOSIS — N1831 Chronic kidney disease, stage 3a: Secondary | ICD-10-CM | POA: Diagnosis not present

## 2023-04-02 DIAGNOSIS — R809 Proteinuria, unspecified: Secondary | ICD-10-CM | POA: Diagnosis not present

## 2023-04-02 DIAGNOSIS — I129 Hypertensive chronic kidney disease with stage 1 through stage 4 chronic kidney disease, or unspecified chronic kidney disease: Secondary | ICD-10-CM | POA: Diagnosis not present

## 2023-04-02 DIAGNOSIS — E1122 Type 2 diabetes mellitus with diabetic chronic kidney disease: Secondary | ICD-10-CM | POA: Diagnosis not present

## 2023-04-02 DIAGNOSIS — M3119 Other thrombotic microangiopathy: Secondary | ICD-10-CM | POA: Diagnosis not present

## 2023-04-03 ENCOUNTER — Other Ambulatory Visit: Payer: Self-pay | Admitting: Nephrology

## 2023-04-03 ENCOUNTER — Ambulatory Visit: Payer: Medicare Other | Attending: Cardiology | Admitting: *Deleted

## 2023-04-03 DIAGNOSIS — Z7901 Long term (current) use of anticoagulants: Secondary | ICD-10-CM

## 2023-04-03 DIAGNOSIS — N1831 Chronic kidney disease, stage 3a: Secondary | ICD-10-CM

## 2023-04-03 DIAGNOSIS — I4821 Permanent atrial fibrillation: Secondary | ICD-10-CM

## 2023-04-03 LAB — POCT INR: INR: 2.4 (ref 2.0–3.0)

## 2023-04-03 NOTE — Patient Instructions (Addendum)
 Description   Continue taking warfarin 1.5 tablets daily except 1 tablet on Mondays.  Stay consistent with greens each week (Continue 1 per week)  Repeat INR in 6 weeks.  Coumadin Clinic [phone]      New Address: [address] [place] [address]

## 2023-04-10 ENCOUNTER — Ambulatory Visit
Admission: RE | Admit: 2023-04-10 | Discharge: 2023-04-10 | Discharge status: Home / Self Care | Source: Ambulatory Visit | Attending: Nephrology

## 2023-04-10 DIAGNOSIS — N1831 Chronic kidney disease, stage 3a: Secondary | ICD-10-CM

## 2023-04-10 DIAGNOSIS — N189 Chronic kidney disease, unspecified: Secondary | ICD-10-CM | POA: Diagnosis not present

## 2023-04-25 DIAGNOSIS — N1831 Chronic kidney disease, stage 3a: Secondary | ICD-10-CM | POA: Diagnosis not present

## 2023-05-09 ENCOUNTER — Encounter (HOSPITAL_BASED_OUTPATIENT_CLINIC_OR_DEPARTMENT_OTHER): Attending: Internal Medicine | Admitting: Internal Medicine

## 2023-05-09 DIAGNOSIS — L97822 Non-pressure chronic ulcer of other part of left lower leg with fat layer exposed: Secondary | ICD-10-CM | POA: Diagnosis not present

## 2023-05-09 DIAGNOSIS — I890 Lymphedema, not elsewhere classified: Secondary | ICD-10-CM | POA: Diagnosis not present

## 2023-05-09 DIAGNOSIS — Z7901 Long term (current) use of anticoagulants: Secondary | ICD-10-CM | POA: Diagnosis not present

## 2023-05-09 DIAGNOSIS — I87311 Chronic venous hypertension (idiopathic) with ulcer of right lower extremity: Secondary | ICD-10-CM | POA: Insufficient documentation

## 2023-05-09 DIAGNOSIS — Z7984 Long term (current) use of oral hypoglycemic drugs: Secondary | ICD-10-CM | POA: Diagnosis not present

## 2023-05-09 DIAGNOSIS — I4819 Other persistent atrial fibrillation: Secondary | ICD-10-CM | POA: Diagnosis not present

## 2023-05-09 DIAGNOSIS — L97812 Non-pressure chronic ulcer of other part of right lower leg with fat layer exposed: Secondary | ICD-10-CM | POA: Insufficient documentation

## 2023-05-09 DIAGNOSIS — E11622 Type 2 diabetes mellitus with other skin ulcer: Secondary | ICD-10-CM | POA: Diagnosis not present

## 2023-05-09 DIAGNOSIS — I87312 Chronic venous hypertension (idiopathic) with ulcer of left lower extremity: Secondary | ICD-10-CM | POA: Insufficient documentation

## 2023-05-15 ENCOUNTER — Ambulatory Visit: Attending: Cardiology

## 2023-05-15 DIAGNOSIS — Z7901 Long term (current) use of anticoagulants: Secondary | ICD-10-CM | POA: Insufficient documentation

## 2023-05-15 LAB — POCT INR: INR: 2.3 (ref 2.0–3.0)

## 2023-05-15 NOTE — Patient Instructions (Signed)
 Description   Continue taking warfarin 1.5 tablets daily except 1 tablet on Mondays.  Stay consistent with greens each week (Continue 1 per week)  Repeat INR in 7 weeks.  Coumadin  Clinic [phone]

## 2023-05-17 ENCOUNTER — Encounter (HOSPITAL_BASED_OUTPATIENT_CLINIC_OR_DEPARTMENT_OTHER): Admitting: Internal Medicine

## 2023-05-17 DIAGNOSIS — L97822 Non-pressure chronic ulcer of other part of left lower leg with fat layer exposed: Secondary | ICD-10-CM

## 2023-05-17 DIAGNOSIS — E11622 Type 2 diabetes mellitus with other skin ulcer: Secondary | ICD-10-CM | POA: Diagnosis not present

## 2023-05-17 DIAGNOSIS — I87312 Chronic venous hypertension (idiopathic) with ulcer of left lower extremity: Secondary | ICD-10-CM | POA: Diagnosis not present

## 2023-05-17 DIAGNOSIS — L97812 Non-pressure chronic ulcer of other part of right lower leg with fat layer exposed: Secondary | ICD-10-CM | POA: Diagnosis not present

## 2023-05-17 DIAGNOSIS — I890 Lymphedema, not elsewhere classified: Secondary | ICD-10-CM | POA: Diagnosis not present

## 2023-05-17 DIAGNOSIS — I87311 Chronic venous hypertension (idiopathic) with ulcer of right lower extremity: Secondary | ICD-10-CM

## 2023-05-27 ENCOUNTER — Encounter (HOSPITAL_BASED_OUTPATIENT_CLINIC_OR_DEPARTMENT_OTHER): Admitting: Internal Medicine

## 2023-05-27 DIAGNOSIS — L97812 Non-pressure chronic ulcer of other part of right lower leg with fat layer exposed: Secondary | ICD-10-CM

## 2023-05-27 DIAGNOSIS — E11622 Type 2 diabetes mellitus with other skin ulcer: Secondary | ICD-10-CM | POA: Diagnosis not present

## 2023-05-27 DIAGNOSIS — I890 Lymphedema, not elsewhere classified: Secondary | ICD-10-CM | POA: Diagnosis not present

## 2023-05-27 DIAGNOSIS — I87311 Chronic venous hypertension (idiopathic) with ulcer of right lower extremity: Secondary | ICD-10-CM

## 2023-05-27 DIAGNOSIS — L97822 Non-pressure chronic ulcer of other part of left lower leg with fat layer exposed: Secondary | ICD-10-CM | POA: Diagnosis not present

## 2023-05-27 DIAGNOSIS — I87312 Chronic venous hypertension (idiopathic) with ulcer of left lower extremity: Secondary | ICD-10-CM

## 2023-06-04 ENCOUNTER — Encounter (HOSPITAL_BASED_OUTPATIENT_CLINIC_OR_DEPARTMENT_OTHER): Admitting: Internal Medicine

## 2023-06-04 DIAGNOSIS — L97812 Non-pressure chronic ulcer of other part of right lower leg with fat layer exposed: Secondary | ICD-10-CM

## 2023-06-04 DIAGNOSIS — I87312 Chronic venous hypertension (idiopathic) with ulcer of left lower extremity: Secondary | ICD-10-CM

## 2023-06-04 DIAGNOSIS — E11622 Type 2 diabetes mellitus with other skin ulcer: Secondary | ICD-10-CM

## 2023-06-04 DIAGNOSIS — L97822 Non-pressure chronic ulcer of other part of left lower leg with fat layer exposed: Secondary | ICD-10-CM

## 2023-06-04 DIAGNOSIS — I87311 Chronic venous hypertension (idiopathic) with ulcer of right lower extremity: Secondary | ICD-10-CM | POA: Diagnosis not present

## 2023-06-04 DIAGNOSIS — I890 Lymphedema, not elsewhere classified: Secondary | ICD-10-CM | POA: Diagnosis not present

## 2023-06-10 ENCOUNTER — Other Ambulatory Visit: Payer: Self-pay | Admitting: Nurse Practitioner

## 2023-06-10 ENCOUNTER — Encounter (HOSPITAL_BASED_OUTPATIENT_CLINIC_OR_DEPARTMENT_OTHER): Attending: Internal Medicine | Admitting: Internal Medicine

## 2023-06-10 DIAGNOSIS — L97822 Non-pressure chronic ulcer of other part of left lower leg with fat layer exposed: Secondary | ICD-10-CM | POA: Insufficient documentation

## 2023-06-10 DIAGNOSIS — I87313 Chronic venous hypertension (idiopathic) with ulcer of bilateral lower extremity: Secondary | ICD-10-CM | POA: Insufficient documentation

## 2023-06-10 DIAGNOSIS — E11622 Type 2 diabetes mellitus with other skin ulcer: Secondary | ICD-10-CM | POA: Diagnosis not present

## 2023-06-10 DIAGNOSIS — L97312 Non-pressure chronic ulcer of right ankle with fat layer exposed: Secondary | ICD-10-CM | POA: Diagnosis not present

## 2023-06-10 DIAGNOSIS — L97322 Non-pressure chronic ulcer of left ankle with fat layer exposed: Secondary | ICD-10-CM | POA: Diagnosis not present

## 2023-06-10 DIAGNOSIS — L97812 Non-pressure chronic ulcer of other part of right lower leg with fat layer exposed: Secondary | ICD-10-CM | POA: Diagnosis not present

## 2023-06-10 DIAGNOSIS — Z7901 Long term (current) use of anticoagulants: Secondary | ICD-10-CM | POA: Insufficient documentation

## 2023-06-10 DIAGNOSIS — I4819 Other persistent atrial fibrillation: Secondary | ICD-10-CM | POA: Insufficient documentation

## 2023-06-10 DIAGNOSIS — I890 Lymphedema, not elsewhere classified: Secondary | ICD-10-CM | POA: Insufficient documentation

## 2023-06-13 ENCOUNTER — Ambulatory Visit: Payer: Medicare Other | Admitting: Nurse Practitioner

## 2023-06-13 ENCOUNTER — Encounter: Payer: Self-pay | Admitting: Nurse Practitioner

## 2023-06-13 VITALS — BP 120/60 | HR 52 | Temp 97.9°F | Ht 71.0 in | Wt 287.0 lb

## 2023-06-13 DIAGNOSIS — E66813 Obesity, class 3: Secondary | ICD-10-CM | POA: Diagnosis not present

## 2023-06-13 DIAGNOSIS — R0981 Nasal congestion: Secondary | ICD-10-CM

## 2023-06-13 DIAGNOSIS — I10 Essential (primary) hypertension: Secondary | ICD-10-CM | POA: Diagnosis not present

## 2023-06-13 DIAGNOSIS — Z2821 Immunization not carried out because of patient refusal: Secondary | ICD-10-CM | POA: Diagnosis not present

## 2023-06-13 DIAGNOSIS — E782 Mixed hyperlipidemia: Secondary | ICD-10-CM

## 2023-06-13 DIAGNOSIS — I4821 Permanent atrial fibrillation: Secondary | ICD-10-CM | POA: Diagnosis not present

## 2023-06-13 DIAGNOSIS — Z6841 Body Mass Index (BMI) 40.0 and over, adult: Secondary | ICD-10-CM

## 2023-06-13 DIAGNOSIS — E11620 Type 2 diabetes mellitus with diabetic dermatitis: Secondary | ICD-10-CM | POA: Diagnosis not present

## 2023-06-13 MED ORDER — FLUTICASONE PROPIONATE 50 MCG/ACT NA SUSP: 2.0000 | Freq: Every day | NASAL | 2 refills | Status: AC

## 2023-06-13 MED ORDER — FLUTICASONE PROPIONATE 50 MCG/ACT NA SUSP: 2.0000 | Freq: Every day | NASAL | 2 refills | Status: DC

## 2023-06-13 MED ORDER — FEXOFENADINE HCL 180 MG PO TABS: 180.0000 mg | ORAL_TABLET | Freq: Every day | ORAL | 1 refills | Status: DC

## 2023-06-13 NOTE — Progress Notes (Signed)
 Del Favia, CMA,acting as a Neurosurgeon for Susanna Epley, FNP.,have documented all relevant documentation on the behalf of Susanna Epley, FNP,as directed by  Susanna Epley, FNP while in the presence of Susanna Epley, FNP.  Subjective:  Patient ID: Brett [name] , male    DOB: Jun 30, 1945 , 78 y.o.   MRN: [id number]  Chief Complaint  Patient presents with   Hypertension    Patient presents today for a bp and dm follow up, Patient reports compliance with medication. Patient denies any chest pain, SOB, or headaches. Patient has no concerns today.    Diabetes   Hyperlipidemia   Nasal Congestion    Patient reports runny nose, stopped up ears, congestion, cough. Patient reports this first started about 2/3 weeks ago.     HPI  Continues to go to the Wound clinic for his legs. He is going weekly. His right leg is almost healed and the left one is healed.   Diabetes He presents for his follow-up diabetic visit. He has type 2 diabetes mellitus. His disease course has been stable. Pertinent negatives for hypoglycemia include no dizziness or headaches. Pertinent negatives for diabetes include no blurred vision, no chest pain and no fatigue. There are no hypoglycemic complications. Symptoms are improving. Diabetic complications include nephropathy. (Skin dermatitis) Risk factors for coronary artery disease include sedentary lifestyle, obesity, male sex, hypertension and diabetes mellitus. Current diabetic treatment includes oral agent (dual therapy). He is compliant with treatment all of the time. He is following a generally unhealthy diet. When asked about meal planning, he reported none. He has not had a previous visit with a dietitian. He rarely participates in exercise. There is no change in his home blood glucose trend. (Reports his blood sugars are normal ) He does not see a podiatrist.Eye exam current: scheduled with Dr. Candi Chafe next month.  Hypertension This is a chronic problem. The current episode  started more than 1 year ago. The problem has been rapidly improving since onset. The problem is uncontrolled. Pertinent negatives include no anxiety, blurred vision, chest pain, headaches or palpitations. There are no associated agents to hypertension. Risk factors for coronary artery disease include obesity and male gender. Past treatments include ACE inhibitors and alpha 1 blockers. The current treatment provides moderate improvement. There are no compliance problems.  There is no history of angina or kidney disease. There is no history of chronic renal disease.     Past Medical History:  Diagnosis Date   Cellulitis of right lower extremity 05/22/2018   DVT of lower extremity, bilateral (HCC) 06/17/2012   Right CFV and popliteal vein, left C. it the only;  also interstitial fluid noted in both calfs   Dyslipidemia    History of: Bacteremia due to coagulase-negative Staphylococcus 06/13/2012   During hospitalization May 30 through 06/19/2012    History: TTP (thrombotic thrombocytopenic purpura) 06/11/2012   Related to DOAC   Hypertension    Obesity (BMI 30-39.9)    OSA on CPAP    Permanent atrial fibrillation (HCC) 02/09/2011   Negative Myoview ; Normal EF by Echo   Severe sepsis with acute organ dysfunction (HCC) 5/30-6/12/2012   Prolonged hospitalization for presumed sepsis and septic shock compounded by coagulopathy thought to be TTP versus Pradaxa mediated. Temporarily on hemodialysis and plasmapheresis.   Sleep apnea    Stage 2 chronic kidney disease 10/01/2022   Type 2 diabetes mellitus without complication, without long-term current use of insulin  (HCC) 09/09/2019     Family History  Problem Relation  Age of Onset   Heart failure Mother 44   Cancer Father        stomach     Current Outpatient Medications:    acetaminophen  (TYLENOL ) 500 MG tablet, Take 500 mg by mouth daily., Disp: , Rfl:    ammonium lactate  (AMLACTIN) 12 % lotion, Apply 1 application topically as needed for  dry skin., Disp: 400 g, Rfl: 3   blood glucose meter kit and supplies KIT, Dispense based on patient and insurance preference. Use up to four times daily as directed. (FOR ICD-9 250.00, 250.01)., Disp: 1 each, Rfl: 0   Blood Glucose Monitoring Suppl (FREESTYLE LITE) DEVI, Use to test blood sugar 3 times a day. Dx code e11.65, Disp: 1 each, Rfl: 3   diltiazem  (CARDIZEM  CD) 240 MG 24 hr capsule, Take 1 capsule (240 mg total) by mouth daily., Disp: 90 capsule, Rfl: 3   fish oil-omega-3 fatty acids 1000 MG capsule, Take 1 g by mouth daily., Disp: , Rfl:    FREESTYLE LITE test strip, TEST UP TO FOUR TIMES A DAY AS DIRECTED, Disp: 300 strip, Rfl: 3   furosemide  (LASIX ) 20 MG tablet, Take 1 tablet (20 mg total) by mouth 2 (two) times daily., Disp: 180 tablet, Rfl: 3   hydrALAZINE  (APRESOLINE ) 50 MG tablet, TAKE 1 TABLET TWICE A DAY WITH FOOD, Disp: 180 tablet, Rfl: 3   JARDIANCE  10 MG TABS tablet, TAKE 1 TABLET DAILY BEFORE BREAKFAST, Disp: 90 tablet, Rfl: 3   Lancets (FREESTYLE) lancets, TEST UP TO FOUR TIMES A DAY AS DIRECTED, Disp: 300 each, Rfl: 3   metFORMIN (GLUCOPHAGE-XR) 750 MG 24 hr tablet, TAKE 1 TABLET DAILY, Disp: 90 tablet, Rfl: 3   metoprolol  tartrate (LOPRESSOR ) 100 MG tablet, TAKE 1 TABLET THREE TIMES A DAY, Disp: 270 tablet, Rfl: 3   Multiple Vitamin (MULITIVITAMIN WITH MINERALS) TABS, Take 1 tablet by mouth daily., Disp: , Rfl:    NON FORMULARY, at bedtime. CPAP, Disp: , Rfl:    pravastatin (PRAVACHOL) 80 MG tablet, TAKE 1 TABLET DAILY, Disp: 90 tablet, Rfl: 3   TRULICITY  1.5 MG/0.5ML SOAJ, INJECT 1.5 MG UNDER THE SKIN ONCE A WEEK, Disp: 6 mL, Rfl: 3   warfarin (COUMADIN ) 4 MG tablet, TAKE ONE TO ONE AND ONE-HALF TABLETS DAILY AS DIRECTED PER COUMADIN  CLINIC, Disp: 140 tablet, Rfl: 3   fexofenadine  (ALLEGRA ) 180 MG tablet, Take 1 tablet (180 mg total) by mouth daily., Disp: 90 tablet, Rfl: 1   fluticasone  (FLONASE ) 50 MCG/ACT nasal spray, Place 2 sprays into both nostrils daily., Disp:  16 g, Rfl: 2   No Known Allergies   Review of Systems  Constitutional:  Negative for fatigue.  HENT:  Positive for congestion (nasal) and rhinorrhea (last week). Negative for sore throat.   Eyes: Negative.  Negative for blurred vision.  Respiratory:  Positive for cough (dry).   Cardiovascular:  Negative for chest pain, palpitations and leg swelling.  Neurological:  Negative for dizziness and headaches.     Today's Vitals   06/13/23 1422  BP: 120/60  Pulse: (!) 52  Temp: 97.9 F (36.6 C)  TempSrc: Oral  SpO2: 98%  Weight: 287 lb (130.2 kg)  Height: 5\' 11"  (1.803 m)  PainSc: 0-No pain   Body mass index is 40.03 kg/m.  Wt Readings from Last 3 Encounters:  06/13/23 287 lb (130.2 kg)  02/13/23 298 lb (135.2 kg)  02/13/23 298 lb 12.8 oz (135.5 kg)    Objective:  Physical Exam Vitals and nursing note  reviewed.  Constitutional:      General: He is not in acute distress.    Appearance: Normal appearance. He is obese.     Comments: Central obesity  Cardiovascular:     Rate and Rhythm: Normal rate and regular rhythm.     Pulses: Normal pulses.     Heart sounds: Normal heart sounds. No murmur heard. Pulmonary:     Effort: Pulmonary effort is normal. No respiratory distress.     Breath sounds: Normal breath sounds. No wheezing.  Musculoskeletal:        General: No swelling.     Right lower leg: Edema (trace edema - much improved with his wraps) present.     Left lower leg: Edema (trace edema - wearing wraps) present.     Comments: Using a cane to ambulate. has a few cratered areas. He is wearing compressive garments to lower extremities  Skin:    General: Skin is warm and dry.     Capillary Refill: Capillary refill takes less than 2 seconds.     Findings: No rash.  Neurological:     General: No focal deficit present.     Mental Status: He is alert and oriented to person, place, and time.     Cranial Nerves: No cranial nerve deficit.     Motor: No weakness.  Psychiatric:         Mood and Affect: Mood normal.        Behavior: Behavior normal.        Thought Content: Thought content normal.        Judgment: Judgment normal.     Assessment And Plan:  Essential hypertension Assessment & Plan: Blood pressure is fairly controlled, continue current medications.   Orders: -     BMP8+eGFR  Type 2 diabetes mellitus with diabetic dermatitis, without long-term current use of insulin  (HCC) Assessment & Plan: Hemoglobin A1c slightly improved.  Continue Trulicity  and metformin.  Will check HgbA1c.   Orders: -     Hemoglobin A1c  Mixed hyperlipidemia Assessment & Plan: Cholesterol levels are stable. Continue statin.   Orders: -     Lipid panel  Nasal congestion Assessment & Plan: Has been ongoing for 3 weeks. Advised to take allegra  due to allergy season. No fever. I have also given flonase .   Orders: -     Fluticasone  Propionate; Place 2 sprays into both nostrils daily.  Dispense: 16 g; Refill: 2  COVID-19 vaccination declined Assessment & Plan: Declines covid 19 vaccine. Discussed risk of covid 42 and if he changes her mind about the vaccine to call the office. Education has been provided regarding the importance of this vaccine but patient still declined. Advised may receive this vaccine at local pharmacy or Health Dept.or vaccine clinic. Aware to provide a copy of the vaccination record if obtained from local pharmacy or Health Dept.  Encouraged to take multivitamin, vitamin d, vitamin c and zinc to increase immune system. Aware can call office if would like to have vaccine here at office. Verbalized acceptance and understanding.     Herpes zoster vaccination declined Assessment & Plan: Declines shingrix , educated on disease process and is aware if he changes his mind to notify office    Tetanus, diphtheria, and acellular pertussis (Tdap) vaccination declined  Class 3 severe obesity due to excess calories with serious comorbidity and body mass  index (BMI) of 40.0 to 44.9 in adult Assessment & Plan: he is encouraged to strive for BMI less than 30  to decrease cardiac risk. Advised to aim for at least 150 minutes of exercise per week.    Permanent atrial fibrillation [facility]) Assessment & Plan: Continue follow-up with Coumadin  clinic   Other orders -     Fexofenadine  HCl; Take 1 tablet (180 mg total) by mouth daily.  Dispense: 90 tablet; Refill: 1    Return for controlled DM check 4 months.  Patient was given opportunity to ask questions. Patient verbalized understanding of the plan and was able to repeat key elements of the plan. All questions were answered to their satisfaction.    Inge Mangle, FNP, have reviewed all documentation for this visit. The documentation on 06/13/23 for the exam, diagnosis, procedures, and orders are all accurate and complete.   IF YOU HAVE BEEN REFERRED TO A SPECIALIST, IT MAY TAKE 1-2 WEEKS TO SCHEDULE/PROCESS THE REFERRAL. IF YOU HAVE NOT HEARD FROM US /SPECIALIST IN TWO WEEKS, PLEASE GIVE US  A CALL AT [phone].

## 2023-06-14 LAB — HEMOGLOBIN A1C
Est. average glucose Bld gHb Est-mCnc: 120 mg/dL
Hgb A1c MFr Bld: 5.8 % — ABNORMAL HIGH (ref 4.8–5.6)

## 2023-06-14 LAB — LIPID PANEL
Chol/HDL Ratio: 3.6 ratio (ref 0.0–5.0)
Cholesterol, Total: 102 mg/dL (ref 100–199)
HDL: 28 mg/dL — ABNORMAL LOW (ref >39)
LDL Chol Calc (NIH): 50 mg/dL (ref 0–99)
Triglycerides: 136 mg/dL (ref 0–149)
VLDL Cholesterol Cal: 24 mg/dL (ref 5–40)

## 2023-06-14 LAB — BMP8+EGFR
BUN/Creatinine Ratio: 22 (ref 10–24)
BUN: 23 mg/dL (ref 8–27)
CO2: 23 mmol/L (ref 20–29)
Calcium: 9.3 mg/dL (ref 8.6–10.2)
Chloride: 100 mmol/L (ref 96–106)
Creatinine, Ser: 1.04 mg/dL (ref 0.76–1.27)
Glucose: 73 mg/dL (ref 70–99)
Potassium: 4.3 mmol/L (ref 3.5–5.2)
Sodium: 139 mmol/L (ref 134–144)
eGFR: 73 mL/min/{1.73_m2} (ref >59)

## 2023-06-16 ENCOUNTER — Ambulatory Visit: Payer: Self-pay | Admitting: Nurse Practitioner

## 2023-06-16 NOTE — Assessment & Plan Note (Signed)
Blood pressure is fairly controlled, continue current medications.  

## 2023-06-16 NOTE — Assessment & Plan Note (Signed)
 he is encouraged to strive for BMI less than 30 to decrease cardiac risk. Advised to aim for at least 150 minutes of exercise per week.

## 2023-06-16 NOTE — Assessment & Plan Note (Signed)
 Has been ongoing for 3 weeks. Advised to take allegra  due to allergy season. No fever. I have also given flonase .

## 2023-06-16 NOTE — Assessment & Plan Note (Signed)
 Hemoglobin A1c slightly improved.  Continue Trulicity and metformin.  Will check HgbA1c.

## 2023-06-16 NOTE — Assessment & Plan Note (Signed)
Continue follow-up with Coumadin clinic 

## 2023-06-16 NOTE — Assessment & Plan Note (Signed)
 Declines shingrix, educated on disease process and is aware if he changes his mind to notify office

## 2023-06-16 NOTE — Assessment & Plan Note (Signed)
 Cholesterol levels are stable. Continue statin

## 2023-06-16 NOTE — Assessment & Plan Note (Signed)

## 2023-06-17 ENCOUNTER — Encounter (HOSPITAL_BASED_OUTPATIENT_CLINIC_OR_DEPARTMENT_OTHER): Admitting: Internal Medicine

## 2023-06-17 DIAGNOSIS — I87311 Chronic venous hypertension (idiopathic) with ulcer of right lower extremity: Secondary | ICD-10-CM

## 2023-06-17 DIAGNOSIS — E11622 Type 2 diabetes mellitus with other skin ulcer: Secondary | ICD-10-CM

## 2023-06-17 DIAGNOSIS — L97822 Non-pressure chronic ulcer of other part of left lower leg with fat layer exposed: Secondary | ICD-10-CM

## 2023-06-17 DIAGNOSIS — I87312 Chronic venous hypertension (idiopathic) with ulcer of left lower extremity: Secondary | ICD-10-CM

## 2023-06-17 DIAGNOSIS — I890 Lymphedema, not elsewhere classified: Secondary | ICD-10-CM | POA: Diagnosis not present

## 2023-06-17 DIAGNOSIS — L97812 Non-pressure chronic ulcer of other part of right lower leg with fat layer exposed: Secondary | ICD-10-CM | POA: Diagnosis not present

## 2023-06-17 DIAGNOSIS — I87313 Chronic venous hypertension (idiopathic) with ulcer of bilateral lower extremity: Secondary | ICD-10-CM | POA: Diagnosis not present

## 2023-06-17 DIAGNOSIS — I4819 Other persistent atrial fibrillation: Secondary | ICD-10-CM | POA: Diagnosis not present

## 2023-06-25 ENCOUNTER — Encounter (HOSPITAL_BASED_OUTPATIENT_CLINIC_OR_DEPARTMENT_OTHER): Admitting: Internal Medicine

## 2023-06-25 DIAGNOSIS — L97812 Non-pressure chronic ulcer of other part of right lower leg with fat layer exposed: Secondary | ICD-10-CM | POA: Diagnosis not present

## 2023-06-25 DIAGNOSIS — L97822 Non-pressure chronic ulcer of other part of left lower leg with fat layer exposed: Secondary | ICD-10-CM

## 2023-06-25 DIAGNOSIS — I87311 Chronic venous hypertension (idiopathic) with ulcer of right lower extremity: Secondary | ICD-10-CM | POA: Diagnosis not present

## 2023-06-25 DIAGNOSIS — I87312 Chronic venous hypertension (idiopathic) with ulcer of left lower extremity: Secondary | ICD-10-CM

## 2023-06-25 DIAGNOSIS — I4819 Other persistent atrial fibrillation: Secondary | ICD-10-CM | POA: Diagnosis not present

## 2023-06-25 DIAGNOSIS — I890 Lymphedema, not elsewhere classified: Secondary | ICD-10-CM | POA: Diagnosis not present

## 2023-06-25 DIAGNOSIS — I87313 Chronic venous hypertension (idiopathic) with ulcer of bilateral lower extremity: Secondary | ICD-10-CM | POA: Diagnosis not present

## 2023-06-25 DIAGNOSIS — E11622 Type 2 diabetes mellitus with other skin ulcer: Secondary | ICD-10-CM | POA: Diagnosis not present

## 2023-07-02 ENCOUNTER — Encounter (HOSPITAL_BASED_OUTPATIENT_CLINIC_OR_DEPARTMENT_OTHER): Admitting: Internal Medicine

## 2023-07-02 DIAGNOSIS — I4819 Other persistent atrial fibrillation: Secondary | ICD-10-CM | POA: Diagnosis not present

## 2023-07-02 DIAGNOSIS — L97822 Non-pressure chronic ulcer of other part of left lower leg with fat layer exposed: Secondary | ICD-10-CM

## 2023-07-02 DIAGNOSIS — L97812 Non-pressure chronic ulcer of other part of right lower leg with fat layer exposed: Secondary | ICD-10-CM | POA: Diagnosis not present

## 2023-07-02 DIAGNOSIS — I87313 Chronic venous hypertension (idiopathic) with ulcer of bilateral lower extremity: Secondary | ICD-10-CM | POA: Diagnosis not present

## 2023-07-02 DIAGNOSIS — I890 Lymphedema, not elsewhere classified: Secondary | ICD-10-CM | POA: Diagnosis not present

## 2023-07-02 DIAGNOSIS — E11622 Type 2 diabetes mellitus with other skin ulcer: Secondary | ICD-10-CM | POA: Diagnosis not present

## 2023-07-03 ENCOUNTER — Ambulatory Visit: Attending: Cardiology

## 2023-07-03 DIAGNOSIS — Z7901 Long term (current) use of anticoagulants: Secondary | ICD-10-CM | POA: Diagnosis not present

## 2023-07-03 LAB — POCT INR: INR: 1.7 — AB (ref 2.0–3.0)

## 2023-07-03 NOTE — Progress Notes (Signed)
Please see anticoagulation encounter.

## 2023-07-03 NOTE — Patient Instructions (Signed)
 Description   Take 2 tablets today and then Continue taking warfarin 1.5 tablets daily except 1 tablet on Mondays.  Stay consistent with greens each week (Continue 1 per week)  Repeat INR in 6 weeks.  Coumadin  Clinic [phone]

## 2023-07-08 ENCOUNTER — Encounter (HOSPITAL_BASED_OUTPATIENT_CLINIC_OR_DEPARTMENT_OTHER): Admitting: Internal Medicine

## 2023-07-08 DIAGNOSIS — I87312 Chronic venous hypertension (idiopathic) with ulcer of left lower extremity: Secondary | ICD-10-CM | POA: Diagnosis not present

## 2023-07-08 DIAGNOSIS — L97822 Non-pressure chronic ulcer of other part of left lower leg with fat layer exposed: Secondary | ICD-10-CM | POA: Diagnosis not present

## 2023-07-08 DIAGNOSIS — E11622 Type 2 diabetes mellitus with other skin ulcer: Secondary | ICD-10-CM | POA: Diagnosis not present

## 2023-07-08 DIAGNOSIS — I4819 Other persistent atrial fibrillation: Secondary | ICD-10-CM | POA: Diagnosis not present

## 2023-07-08 DIAGNOSIS — I890 Lymphedema, not elsewhere classified: Secondary | ICD-10-CM | POA: Diagnosis not present

## 2023-07-08 DIAGNOSIS — L97812 Non-pressure chronic ulcer of other part of right lower leg with fat layer exposed: Secondary | ICD-10-CM | POA: Diagnosis not present

## 2023-07-08 DIAGNOSIS — I87313 Chronic venous hypertension (idiopathic) with ulcer of bilateral lower extremity: Secondary | ICD-10-CM | POA: Diagnosis not present

## 2023-07-15 ENCOUNTER — Encounter (HOSPITAL_BASED_OUTPATIENT_CLINIC_OR_DEPARTMENT_OTHER): Attending: Internal Medicine | Admitting: Internal Medicine

## 2023-07-15 DIAGNOSIS — L97822 Non-pressure chronic ulcer of other part of left lower leg with fat layer exposed: Secondary | ICD-10-CM | POA: Diagnosis not present

## 2023-07-15 DIAGNOSIS — I890 Lymphedema, not elsewhere classified: Secondary | ICD-10-CM | POA: Diagnosis not present

## 2023-07-15 DIAGNOSIS — Z7901 Long term (current) use of anticoagulants: Secondary | ICD-10-CM | POA: Diagnosis not present

## 2023-07-15 DIAGNOSIS — I4819 Other persistent atrial fibrillation: Secondary | ICD-10-CM | POA: Diagnosis not present

## 2023-07-15 DIAGNOSIS — E11622 Type 2 diabetes mellitus with other skin ulcer: Secondary | ICD-10-CM | POA: Insufficient documentation

## 2023-07-15 DIAGNOSIS — I87312 Chronic venous hypertension (idiopathic) with ulcer of left lower extremity: Secondary | ICD-10-CM | POA: Insufficient documentation

## 2023-07-15 DIAGNOSIS — Z7984 Long term (current) use of oral hypoglycemic drugs: Secondary | ICD-10-CM | POA: Diagnosis not present

## 2023-07-22 ENCOUNTER — Encounter (HOSPITAL_BASED_OUTPATIENT_CLINIC_OR_DEPARTMENT_OTHER): Admitting: Internal Medicine

## 2023-07-22 ENCOUNTER — Other Ambulatory Visit: Payer: Self-pay | Admitting: Nurse Practitioner

## 2023-07-22 DIAGNOSIS — I890 Lymphedema, not elsewhere classified: Secondary | ICD-10-CM

## 2023-07-22 DIAGNOSIS — E11622 Type 2 diabetes mellitus with other skin ulcer: Secondary | ICD-10-CM | POA: Diagnosis not present

## 2023-07-22 DIAGNOSIS — I87312 Chronic venous hypertension (idiopathic) with ulcer of left lower extremity: Secondary | ICD-10-CM

## 2023-07-22 DIAGNOSIS — L97822 Non-pressure chronic ulcer of other part of left lower leg with fat layer exposed: Secondary | ICD-10-CM

## 2023-07-30 ENCOUNTER — Encounter: Payer: Self-pay | Admitting: Family Medicine

## 2023-07-30 ENCOUNTER — Ambulatory Visit (INDEPENDENT_AMBULATORY_CARE_PROVIDER_SITE_OTHER): Admitting: Family Medicine

## 2023-07-30 VITALS — BP 120/82 | HR 57 | Temp 98.2°F | Ht 71.0 in | Wt 291.0 lb

## 2023-07-30 DIAGNOSIS — E66813 Obesity, class 3: Secondary | ICD-10-CM

## 2023-07-30 DIAGNOSIS — Z6841 Body Mass Index (BMI) 40.0 and over, adult: Secondary | ICD-10-CM

## 2023-07-30 DIAGNOSIS — T162XXA Foreign body in left ear, initial encounter: Secondary | ICD-10-CM | POA: Diagnosis not present

## 2023-07-30 NOTE — Assessment & Plan Note (Signed)
 He is encouraged to strive for BMI less than 30 to decrease cardiac risk. Advised to aim for at least 150 minutes of exercise per week.

## 2023-07-30 NOTE — Assessment & Plan Note (Signed)
-   Advised use of Debrox for earwax buildup instead of Q-tips to prevent recurrence.

## 2023-07-30 NOTE — Progress Notes (Signed)
 I,Jameka J Llittleton, CMA,acting as a Neurosurgeon for [employer], NP.,have documented all relevant documentation on the behalf of Bruna Creighton, NP,as directed by  Bruna Creighton, NP while in the presence of Bruna Creighton, NP.  Subjective:  Patient ID: Brett [name] , male    DOB: 07-23-1945 , 78 y.o.   MRN: [id number]  Chief Complaint  Patient presents with   Ear concerns    Patient presents he was cleaning his ears and got a qtip stuck. He stated the cotton has been in his ear for a week now.     HPI Discussed the use of AI scribe software for clinical note transcription with the patient, who gave verbal consent to proceed.  History of Present Illness    Brett [name] is a 78 year old male who presents complaints of altered hearing in his left ear because he has  a cotton tip lodged in his left ear.  Approximately one week ago, while cleaning his left ear with a Q-tip, the cotton tip detached and remained lodged in his ear canal. Initially, it did not cause any pain or discomfort, which delayed his visit to the clinic.  Recently, he has noticed a change in auditory perception, describing it as a 'whooshing sound' and a decrease in volume in his left ear. He attempted to remove the cotton tip himself but was unsuccessful and refrained from using any invasive methods to avoid further complications.  No pain is reported, but he is concerned about the altered hearing sensation. The patient stated he has never had a Q-tip incident like this before.     Past Medical History:  Diagnosis Date   Cellulitis of right lower extremity 05/22/2018   DVT of lower extremity, bilateral (HCC) 06/17/2012   Right CFV and popliteal vein, left C. it the only;  also interstitial fluid noted in both calfs   Dyslipidemia    History of: Bacteremia due to coagulase-negative Staphylococcus 06/13/2012   During hospitalization May 30 through 06/19/2012    History: TTP (thrombotic thrombocytopenic purpura)  06/11/2012   Related to DOAC   Hypertension    Obesity (BMI 30-39.9)    OSA on CPAP    Permanent atrial fibrillation (HCC) 02/09/2011   Negative Myoview ; Normal EF by Echo   Severe sepsis with acute organ dysfunction (HCC) 5/30-6/12/2012   Prolonged hospitalization for presumed sepsis and septic shock compounded by coagulopathy thought to be TTP versus Pradaxa mediated. Temporarily on hemodialysis and plasmapheresis.   Sleep apnea    Stage 2 chronic kidney disease 10/01/2022   Type 2 diabetes mellitus without complication, without long-term current use of insulin  (HCC) 09/09/2019     Family History  Problem Relation Age of Onset   Heart failure Mother 69   Cancer Father        stomach     Current Outpatient Medications:    acetaminophen  (TYLENOL ) 500 MG tablet, Take 500 mg by mouth daily., Disp: , Rfl:    ammonium lactate  (AMLACTIN) 12 % lotion, Apply 1 application topically as needed for dry skin., Disp: 400 g, Rfl: 3   blood glucose meter kit and supplies KIT, Dispense based on patient and insurance preference. Use up to four times daily as directed. (FOR ICD-9 250.00, 250.01)., Disp: 1 each, Rfl: 0   Blood Glucose Monitoring Suppl (FREESTYLE LITE) DEVI, Use to test blood sugar 3 times a day. Dx code e11.65, Disp: 1 each, Rfl: 3   diltiazem  (CARDIZEM  CD) 240 MG 24 hr capsule,  TAKE 1 CAPSULE DAILY, Disp: 90 capsule, Rfl: 3   fexofenadine  (ALLEGRA ) 180 MG tablet, Take 1 tablet (180 mg total) by mouth daily., Disp: 90 tablet, Rfl: 1   fish oil-omega-3 fatty acids 1000 MG capsule, Take 1 g by mouth daily., Disp: , Rfl:    fluticasone  (FLONASE ) 50 MCG/ACT nasal spray, Place 2 sprays into both nostrils daily., Disp: 16 g, Rfl: 2   FREESTYLE LITE test strip, TEST UP TO FOUR TIMES A DAY AS DIRECTED, Disp: 300 strip, Rfl: 3   furosemide  (LASIX ) 20 MG tablet, Take 1 tablet (20 mg total) by mouth 2 (two) times daily., Disp: 180 tablet, Rfl: 3   hydrALAZINE  (APRESOLINE ) 50 MG tablet, TAKE 1  TABLET TWICE A DAY WITH FOOD, Disp: 180 tablet, Rfl: 3   JARDIANCE  10 MG TABS tablet, TAKE 1 TABLET DAILY BEFORE BREAKFAST, Disp: 90 tablet, Rfl: 3   Lancets (FREESTYLE) lancets, TEST UP TO FOUR TIMES A DAY AS DIRECTED, Disp: 300 each, Rfl: 3   metFORMIN (GLUCOPHAGE-XR) 750 MG 24 hr tablet, TAKE 1 TABLET DAILY, Disp: 90 tablet, Rfl: 3   metoprolol  tartrate (LOPRESSOR ) 100 MG tablet, TAKE 1 TABLET THREE TIMES A DAY, Disp: 270 tablet, Rfl: 3   Multiple Vitamin (MULITIVITAMIN WITH MINERALS) TABS, Take 1 tablet by mouth daily., Disp: , Rfl:    NON FORMULARY, at bedtime. CPAP, Disp: , Rfl:    pravastatin (PRAVACHOL) 80 MG tablet, TAKE 1 TABLET DAILY, Disp: 90 tablet, Rfl: 3   TRULICITY  1.5 MG/0.5ML SOAJ, INJECT 1.5 MG UNDER THE SKIN ONCE A WEEK, Disp: 6 mL, Rfl: 3   warfarin (COUMADIN ) 4 MG tablet, TAKE ONE TO ONE AND ONE-HALF TABLETS DAILY AS DIRECTED PER COUMADIN  CLINIC, Disp: 140 tablet, Rfl: 3   No Known Allergies   Review of Systems  Constitutional: Negative.   HENT: Negative.  Negative for ear discharge.   Respiratory: Negative.  Negative for chest tightness and wheezing.   Cardiovascular:  Positive for leg swelling. Negative for chest pain and palpitations.  Genitourinary: Negative.   Musculoskeletal:  Positive for gait problem.       Uses a cane  Psychiatric/Behavioral: Negative.       Today's Vitals   07/30/23 1421  BP: 120/82  Pulse: (!) 57  Temp: 98.2 F (36.8 C)  TempSrc: Oral  Weight: 291 lb (132 kg)  Height: 5' 11 (1.803 m)  PainSc: 0-No pain   Body mass index is 40.59 kg/m.  Wt Readings from Last 3 Encounters:  07/30/23 291 lb (132 kg)  06/13/23 287 lb (130.2 kg)  02/13/23 298 lb (135.2 kg)    The ASCVD Risk score (Arnett DK, et al., 2019) failed to calculate for the following reasons:   The valid total cholesterol range is 130 to 320 mg/dL  Objective:  Physical Exam HENT:     Head: Normocephalic.     Right Ear: Hearing normal. There is no impacted cerumen.  No foreign body.     Left Ear: Decreased hearing noted. A foreign body is present.     Ears:     Comments: - Examine left ear for foreign body. - Performed ear cleaning to remove foreign body if present. -Ear cleaning was well tolerated. - Reassessed ear post-cleaning for successful removal. -Right ear was cleaned successfully also. Cardiovascular:     Rate and Rhythm: Bradycardia present.  Musculoskeletal:     Right lower leg: Edema present.     Left lower leg: Edema present.  Neurological:  Mental Status: He is alert. Mental status is at baseline.         Assessment And Plan:  Foreign body of left ear, initial encounter Assessment & Plan: - Advised use of Debrox for earwax buildup instead of Q-tips to prevent recurrence.   Class 3 severe obesity due to excess calories with serious comorbidity and body mass index (BMI) of 40.0 to 44.9 in adult Assessment & Plan: He is encouraged to strive for BMI less than 30 to decrease cardiac risk. Advised to aim for at least 150 minutes of exercise per week.       Assessment & Plan Foreign body in left ear canal Cotton end of Q-tip possibly lodged in left ear canal causing whooshing sound and decreased hearing.  - Ensure follow-up appointment on October 6th.    Return if symptoms worsen or fail to improve, for keep next appt.  Patient was given opportunity to ask questions. Patient verbalized understanding of the plan and was able to repeat key elements of the plan. All questions were answered to their satisfaction.  Assessment and Plan  I, Bruna Creighton, NP, have reviewed all documentation for this visit. The documentation on 07/30/2023 for the exam, diagnosis, procedures, and orders are all accurate and complete.     IF YOU HAVE BEEN REFERRED TO A SPECIALIST, IT MAY TAKE 1-2 WEEKS TO SCHEDULE/PROCESS THE REFERRAL. IF YOU HAVE NOT HEARD FROM US /SPECIALIST IN TWO WEEKS, PLEASE GIVE US  A CALL AT [phone] X [phone].

## 2023-08-14 ENCOUNTER — Ambulatory Visit: Attending: Cardiology | Admitting: *Deleted

## 2023-08-14 DIAGNOSIS — I4821 Permanent atrial fibrillation: Secondary | ICD-10-CM | POA: Insufficient documentation

## 2023-08-14 DIAGNOSIS — Z7901 Long term (current) use of anticoagulants: Secondary | ICD-10-CM | POA: Insufficient documentation

## 2023-08-14 LAB — POCT INR: INR: 2.3 (ref 2.0–3.0)

## 2023-08-14 NOTE — Progress Notes (Signed)
 INR 2.3. Please see anticoagulation encounter

## 2023-08-14 NOTE — Patient Instructions (Signed)
 Description   Continue taking warfarin 1.5 tablets daily except 1 tablet on Mondays.  Stay consistent with greens each week (Continue 1 per week)  Repeat INR in 7 weeks.  Coumadin  Clinic [phone]

## 2023-09-18 DIAGNOSIS — N1831 Chronic kidney disease, stage 3a: Secondary | ICD-10-CM | POA: Diagnosis not present

## 2023-09-19 DIAGNOSIS — N1831 Chronic kidney disease, stage 3a: Secondary | ICD-10-CM | POA: Diagnosis not present

## 2023-09-24 DIAGNOSIS — M3119 Other thrombotic microangiopathy: Secondary | ICD-10-CM | POA: Diagnosis not present

## 2023-09-24 DIAGNOSIS — R809 Proteinuria, unspecified: Secondary | ICD-10-CM | POA: Diagnosis not present

## 2023-09-24 DIAGNOSIS — E1122 Type 2 diabetes mellitus with diabetic chronic kidney disease: Secondary | ICD-10-CM | POA: Diagnosis not present

## 2023-09-24 DIAGNOSIS — I129 Hypertensive chronic kidney disease with stage 1 through stage 4 chronic kidney disease, or unspecified chronic kidney disease: Secondary | ICD-10-CM | POA: Diagnosis not present

## 2023-09-24 DIAGNOSIS — N1831 Chronic kidney disease, stage 3a: Secondary | ICD-10-CM | POA: Diagnosis not present

## 2023-10-02 ENCOUNTER — Ambulatory Visit

## 2023-10-04 ENCOUNTER — Other Ambulatory Visit: Payer: Self-pay | Admitting: Nurse Practitioner

## 2023-10-09 ENCOUNTER — Ambulatory Visit: Attending: Cardiology | Admitting: *Deleted

## 2023-10-09 DIAGNOSIS — Z7901 Long term (current) use of anticoagulants: Secondary | ICD-10-CM | POA: Diagnosis not present

## 2023-10-09 DIAGNOSIS — I4821 Permanent atrial fibrillation: Secondary | ICD-10-CM | POA: Insufficient documentation

## 2023-10-09 LAB — POCT INR: INR: 2.6 (ref 2.0–3.0)

## 2023-10-09 NOTE — Patient Instructions (Signed)
 Description   INR-2.6; Continue taking warfarin 1.5 tablets daily except 1 tablet on Mondays.  Stay consistent with greens each week (Continue 1 per week)  Repeat INR in 8 weeks.  Coumadin  Clinic [phone]

## 2023-10-09 NOTE — Progress Notes (Signed)
 Description   INR-2.6; Continue taking warfarin 1.5 tablets daily except 1 tablet on Mondays.  Stay consistent with greens each week (Continue 1 per week)  Repeat INR in 8 weeks.  Coumadin  Clinic [phone]

## 2023-10-14 ENCOUNTER — Encounter: Payer: Self-pay | Admitting: Nurse Practitioner

## 2023-10-14 ENCOUNTER — Ambulatory Visit: Admitting: Nurse Practitioner

## 2023-10-14 VITALS — BP 120/60 | HR 60 | Temp 98.3°F | Ht 71.0 in | Wt 287.4 lb

## 2023-10-14 DIAGNOSIS — R202 Paresthesia of skin: Secondary | ICD-10-CM | POA: Diagnosis not present

## 2023-10-14 DIAGNOSIS — I129 Hypertensive chronic kidney disease with stage 1 through stage 4 chronic kidney disease, or unspecified chronic kidney disease: Secondary | ICD-10-CM | POA: Diagnosis not present

## 2023-10-14 DIAGNOSIS — R200 Anesthesia of skin: Secondary | ICD-10-CM

## 2023-10-14 DIAGNOSIS — E1122 Type 2 diabetes mellitus with diabetic chronic kidney disease: Secondary | ICD-10-CM | POA: Diagnosis not present

## 2023-10-14 DIAGNOSIS — I10 Essential (primary) hypertension: Secondary | ICD-10-CM

## 2023-10-14 DIAGNOSIS — Z2821 Immunization not carried out because of patient refusal: Secondary | ICD-10-CM

## 2023-10-14 DIAGNOSIS — N182 Chronic kidney disease, stage 2 (mild): Secondary | ICD-10-CM

## 2023-10-14 DIAGNOSIS — E782 Mixed hyperlipidemia: Secondary | ICD-10-CM

## 2023-10-14 DIAGNOSIS — E11620 Type 2 diabetes mellitus with diabetic dermatitis: Secondary | ICD-10-CM | POA: Diagnosis not present

## 2023-10-14 MED ORDER — KERENDIA 10 MG PO TABS: 1.0000 | ORAL_TABLET | Freq: Every day | ORAL | Status: AC

## 2023-10-14 NOTE — Assessment & Plan Note (Addendum)
 Diabetes managed with Trulicity  and metformin. Neuropathy symptoms reported. - Continue Trulicity  and metformin. - Check vitamin B and thyroid  levels. - Consider vitamin B supplement based on results.  Started on Kerendia 10 mg for kidney protection. - Continue Kerendia 10 mg.

## 2023-10-14 NOTE — Assessment & Plan Note (Signed)
Blood pressure is fairly controlled, continue current medications.  

## 2023-10-14 NOTE — Progress Notes (Signed)
 LILLETTE Kristeen JINNY Gladis, CMA,acting as a Neurosurgeon for Brett Ada, FNP.,have documented all relevant documentation on the behalf of Brett Ada, FNP,as directed by  Brett Ada, FNP while in the presence of Brett Ada, FNP.  Subjective:  Patient ID: Brett [name] , male    DOB: Jul 16, 1945 , 78 y.o.   MRN: [id number]  Chief Complaint  Patient presents with   Hypertension    Patient presents today for a bp and dm follow up, Patient reports compliance with medication. Patient denies any chest pain, SOB, or headaches. Patient has no concerns today.     Discussed the use of AI scribe software for clinical note transcription with the patient, who gave verbal consent to proceed.  History of Present Illness Brett [name] is a 78 year old male with diabetes and hypertension who presents for a follow-up visit.  He has been monitoring his blood sugar regularly and reports it is well-controlled. He recently saw a nephrologist and started on Kerendia (finerenone) 10 mg, which he began taking on Friday. He continues to take Trulicity , administered weekly on Sunday nights, and is halfway through his three-month supply.  He uses compression therapy daily for both lower extremities and completed treatment at the wound care center in August. He notes that his condition is stable as long as there are no new breakouts.  He experiences tingling and stiffness, describing it as 'getting stiff sometimes.' He recalls having a nerve conduction test on his feet previously but is unsure if it involved needles. He currently takes a multivitamin and is considering adding a vitamin B supplement.   Diabetes He presents for his follow-up diabetic visit. He has type 2 diabetes mellitus. His disease course has been stable. Pertinent negatives for hypoglycemia include no dizziness or headaches. Pertinent negatives for diabetes include no blurred vision, no chest pain and no fatigue. There are no hypoglycemic complications.  Symptoms are improving. Diabetic complications include nephropathy. (Skin dermatitis) Risk factors for coronary artery disease include sedentary lifestyle, obesity, male sex, hypertension and diabetes mellitus. Current diabetic treatment includes oral agent (dual therapy). He is compliant with treatment all of the time. He is following a generally unhealthy diet. When asked about meal planning, he reported none. He has not had a previous visit with a dietitian. He rarely participates in exercise. There is no change in his home blood glucose trend. (Reports his blood sugars are normal ) He does not see a podiatrist.Eye exam current: scheduled with Dr. Octavia next month.  Hypertension This is a chronic problem. The current episode started more than 1 year ago. The problem has been rapidly improving since onset. The problem is uncontrolled. Pertinent negatives include no anxiety, blurred vision, chest pain, headaches or palpitations. There are no associated agents to hypertension. Risk factors for coronary artery disease include obesity and male gender. Past treatments include ACE inhibitors and alpha 1 blockers. The current treatment provides moderate improvement. There are no compliance problems.  There is no history of angina or kidney disease. There is no history of chronic renal disease.     Past Medical History:  Diagnosis Date   Cellulitis of right lower extremity 05/22/2018   DVT of lower extremity, bilateral (HCC) 06/17/2012   Right CFV and popliteal vein, left C. it the only;  also interstitial fluid noted in both calfs   Dyslipidemia    History of: Bacteremia due to coagulase-negative Staphylococcus 06/13/2012   During hospitalization May 30 through 06/19/2012    History: TTP (thrombotic thrombocytopenic  purpura) 06/11/2012   Related to DOAC   Hypertension    Obesity (BMI 30-39.9)    OSA on CPAP    Permanent atrial fibrillation (HCC) 02/09/2011   Negative Myoview ; Normal EF by Echo    Severe sepsis with acute organ dysfunction (HCC) 5/30-6/12/2012   Prolonged hospitalization for presumed sepsis and septic shock compounded by coagulopathy thought to be TTP versus Pradaxa mediated. Temporarily on hemodialysis and plasmapheresis.   Sleep apnea    Stage 2 chronic kidney disease 10/01/2022   Type 2 diabetes mellitus without complication, without long-term current use of insulin  (HCC) 09/09/2019     Family History  Problem Relation Age of Onset   Heart failure Mother 4   Cancer Father        stomach     Current Outpatient Medications:    acetaminophen  (TYLENOL ) 500 MG tablet, Take 500 mg by mouth daily., Disp: , Rfl:    ammonium lactate  (AMLACTIN) 12 % lotion, Apply 1 application topically as needed for dry skin., Disp: 400 g, Rfl: 3   blood glucose meter kit and supplies KIT, Dispense based on patient and insurance preference. Use up to four times daily as directed. (FOR ICD-9 250.00, 250.01)., Disp: 1 each, Rfl: 0   Blood Glucose Monitoring Suppl (FREESTYLE LITE) DEVI, Use to test blood sugar 3 times a day. Dx code e11.65, Disp: 1 each, Rfl: 3   diltiazem  (CARDIZEM  CD) 240 MG 24 hr capsule, TAKE 1 CAPSULE DAILY, Disp: 90 capsule, Rfl: 3   fexofenadine  (ALLEGRA ) 180 MG tablet, Take 1 tablet (180 mg total) by mouth daily., Disp: 90 tablet, Rfl: 1   Finerenone (KERENDIA) 10 MG TABS, Take 1 tablet (10 mg total) by mouth daily., Disp: , Rfl:    fish oil-omega-3 fatty acids 1000 MG capsule, Take 1 g by mouth daily., Disp: , Rfl:    fluticasone  (FLONASE ) 50 MCG/ACT nasal spray, Place 2 sprays into both nostrils daily., Disp: 16 g, Rfl: 2   FREESTYLE LITE test strip, TEST UP TO FOUR TIMES A DAY AS DIRECTED, Disp: 300 strip, Rfl: 3   furosemide  (LASIX ) 20 MG tablet, Take 1 tablet (20 mg total) by mouth 2 (two) times daily., Disp: 180 tablet, Rfl: 3   hydrALAZINE  (APRESOLINE ) 50 MG tablet, TAKE 1 TABLET TWICE A DAY WITH FOOD, Disp: 180 tablet, Rfl: 3   JARDIANCE  10 MG TABS tablet,  TAKE 1 TABLET DAILY BEFORE BREAKFAST, Disp: 90 tablet, Rfl: 3   Lancets (FREESTYLE) lancets, TEST UP TO FOUR TIMES A DAY AS DIRECTED, Disp: 300 each, Rfl: 3   metFORMIN (GLUCOPHAGE-XR) 750 MG 24 hr tablet, TAKE 1 TABLET DAILY, Disp: 90 tablet, Rfl: 3   metoprolol  tartrate (LOPRESSOR ) 100 MG tablet, TAKE 1 TABLET THREE TIMES A DAY, Disp: 270 tablet, Rfl: 3   Multiple Vitamin (MULITIVITAMIN WITH MINERALS) TABS, Take 1 tablet by mouth daily., Disp: , Rfl:    NON FORMULARY, at bedtime. CPAP, Disp: , Rfl:    pravastatin (PRAVACHOL) 80 MG tablet, TAKE 1 TABLET DAILY, Disp: 90 tablet, Rfl: 3   TRULICITY  1.5 MG/0.5ML SOAJ, INJECT 1.5 MG UNDER THE SKIN ONCE A WEEK, Disp: 6 mL, Rfl: 3   warfarin (COUMADIN ) 4 MG tablet, TAKE ONE TO ONE AND ONE-HALF TABLETS DAILY AS DIRECTED PER COUMADIN  CLINIC, Disp: 140 tablet, Rfl: 3   No Known Allergies   Review of Systems  Constitutional:  Negative for fatigue.  Eyes:  Negative for blurred vision.  Respiratory: Negative.    Cardiovascular:  Positive  for leg swelling. Negative for chest pain and palpitations.  Musculoskeletal: Negative.   Neurological:  Negative for dizziness and headaches.  Psychiatric/Behavioral: Negative.       Today's Vitals   10/14/23 1426  BP: 120/60  Pulse: 60  Temp: 98.3 F (36.8 C)  TempSrc: Oral  Weight: 287 lb 6.4 oz (130.4 kg)  Height: 5' 11 (1.803 m)  PainSc: 0-No pain   Body mass index is 40.08 kg/m.  Wt Readings from Last 3 Encounters:  10/14/23 287 lb 6.4 oz (130.4 kg)  07/30/23 291 lb (132 kg)  06/13/23 287 lb (130.2 kg)     Objective:  Physical Exam Vitals and nursing note reviewed.  Constitutional:      General: He is not in acute distress.    Appearance: Normal appearance. He is obese.     Comments: Central obesity  Cardiovascular:     Rate and Rhythm: Normal rate and regular rhythm.     Pulses: Normal pulses.     Heart sounds: Normal heart sounds. No murmur heard. Pulmonary:     Effort: Pulmonary  effort is normal. No respiratory distress.     Breath sounds: Normal breath sounds. No wheezing.  Musculoskeletal:        General: No swelling.     Right lower leg: Edema (trace edema - much improved with his wraps) present.     Left lower leg: Edema (trace edema - wearing wraps) present.     Comments: Using a cane to ambulate. He is wearing compressive garments to lower extremities  Skin:    General: Skin is warm and dry.     Capillary Refill: Capillary refill takes less than 2 seconds.     Findings: No rash.  Neurological:     General: No focal deficit present.     Mental Status: He is alert and oriented to person, place, and time.     Cranial Nerves: No cranial nerve deficit.     Motor: No weakness.  Psychiatric:        Mood and Affect: Mood normal.        Behavior: Behavior normal.        Thought Content: Thought content normal.        Judgment: Judgment normal.        Assessment And Plan:  Essential hypertension Assessment & Plan: Blood pressure is fairly controlled, continue current medications.   Orders: -     BMP8+eGFR  Type 2 diabetes mellitus with stage 2 chronic kidney disease, without long-term current use of insulin  (HCC) Assessment & Plan: Diabetes managed with Trulicity  and metformin. Neuropathy symptoms reported. - Continue Trulicity  and metformin. - Check vitamin B and thyroid  levels. - Consider vitamin B supplement based on results.  Started on Kerendia 10 mg for kidney protection. - Continue Kerendia 10 mg.  Orders: -     Hemoglobin A1c -     Lipid panel -     BMP8+eGFR  Mixed hyperlipidemia Assessment & Plan: Cholesterol levels are stable. Continue statin.   Orders: -     Lipid panel -     BMP8+eGFR  Influenza vaccination declined  Numbness and tingling in both hands -     Vitamin B12 -     TSH  Other orders -     Leonore; Take 1 tablet (10 mg total) by mouth daily.   Return for keep same next.  Patient was given opportunity to  ask questions. Patient verbalized understanding of the plan and was  able to repeat key elements of the plan. All questions were answered to their satisfaction.    LILLETTE Brett Ada, FNP, have reviewed all documentation for this visit. The documentation on 10/14/23 for the exam, diagnosis, procedures, and orders are all accurate and complete.   IF YOU HAVE BEEN REFERRED TO A SPECIALIST, IT MAY TAKE 1-2 WEEKS TO SCHEDULE/PROCESS THE REFERRAL. IF YOU HAVE NOT HEARD FROM US /SPECIALIST IN TWO WEEKS, PLEASE GIVE US  A CALL AT [phone] X [phone].

## 2023-10-14 NOTE — Assessment & Plan Note (Signed)
 Cholesterol levels are stable. Continue statin

## 2023-10-15 LAB — TSH: TSH: 2.15 u[IU]/mL (ref 0.450–4.500)

## 2023-10-15 LAB — LIPID PANEL
Chol/HDL Ratio: 3.9 ratio (ref 0.0–5.0)
Cholesterol, Total: 110 mg/dL (ref 100–199)
HDL: 28 mg/dL — ABNORMAL LOW (ref >39)
LDL Chol Calc (NIH): 55 mg/dL (ref 0–99)
Triglycerides: 158 mg/dL — ABNORMAL HIGH (ref 0–149)
VLDL Cholesterol Cal: 27 mg/dL (ref 5–40)

## 2023-10-15 LAB — HEMOGLOBIN A1C
Est. average glucose Bld gHb Est-mCnc: 114 mg/dL
Hgb A1c MFr Bld: 5.6 % (ref 4.8–5.6)

## 2023-10-15 LAB — BMP8+EGFR
BUN/Creatinine Ratio: 19 (ref 10–24)
BUN: 23 mg/dL (ref 8–27)
CO2: 27 mmol/L (ref 20–29)
Calcium: 9.6 mg/dL (ref 8.6–10.2)
Chloride: 100 mmol/L (ref 96–106)
Creatinine, Ser: 1.19 mg/dL (ref 0.76–1.27)
Glucose: 74 mg/dL (ref 70–99)
Potassium: 4.9 mmol/L (ref 3.5–5.2)
Sodium: 140 mmol/L (ref 134–144)
eGFR: 63 mL/min/1.73 (ref >59)

## 2023-10-15 LAB — VITAMIN B12: Vitamin B-12: 735 pg/mL (ref 232–1245)

## 2023-10-21 ENCOUNTER — Other Ambulatory Visit: Payer: Self-pay | Admitting: Cardiology

## 2023-10-21 DIAGNOSIS — I4821 Permanent atrial fibrillation: Secondary | ICD-10-CM

## 2023-10-21 NOTE — Telephone Encounter (Signed)
 Warfarin 4mg  refill Afib Last INR 10/09/23 Last OV 11/30/22

## 2023-10-24 DIAGNOSIS — N1831 Chronic kidney disease, stage 3a: Secondary | ICD-10-CM | POA: Diagnosis not present

## 2023-10-27 ENCOUNTER — Ambulatory Visit: Payer: Self-pay | Admitting: Nurse Practitioner

## 2023-10-30 ENCOUNTER — Other Ambulatory Visit: Payer: Self-pay | Admitting: Nurse Practitioner

## 2023-11-11 ENCOUNTER — Other Ambulatory Visit: Payer: Self-pay | Admitting: Nurse Practitioner

## 2023-11-11 DIAGNOSIS — E11620 Type 2 diabetes mellitus with diabetic dermatitis: Secondary | ICD-10-CM

## 2023-12-03 ENCOUNTER — Encounter

## 2023-12-09 ENCOUNTER — Encounter (HOSPITAL_BASED_OUTPATIENT_CLINIC_OR_DEPARTMENT_OTHER): Admitting: General Surgery

## 2023-12-09 DIAGNOSIS — L97822 Non-pressure chronic ulcer of other part of left lower leg with fat layer exposed: Secondary | ICD-10-CM | POA: Insufficient documentation

## 2023-12-09 DIAGNOSIS — I4821 Permanent atrial fibrillation: Secondary | ICD-10-CM

## 2023-12-09 DIAGNOSIS — L97312 Non-pressure chronic ulcer of right ankle with fat layer exposed: Secondary | ICD-10-CM | POA: Insufficient documentation

## 2023-12-09 DIAGNOSIS — L97322 Non-pressure chronic ulcer of left ankle with fat layer exposed: Secondary | ICD-10-CM | POA: Insufficient documentation

## 2023-12-09 DIAGNOSIS — I890 Lymphedema, not elsewhere classified: Secondary | ICD-10-CM | POA: Diagnosis not present

## 2023-12-09 DIAGNOSIS — E11622 Type 2 diabetes mellitus with other skin ulcer: Secondary | ICD-10-CM | POA: Insufficient documentation

## 2023-12-09 DIAGNOSIS — I872 Venous insufficiency (chronic) (peripheral): Secondary | ICD-10-CM | POA: Diagnosis not present

## 2023-12-16 ENCOUNTER — Encounter (HOSPITAL_BASED_OUTPATIENT_CLINIC_OR_DEPARTMENT_OTHER): Admitting: General Surgery

## 2023-12-16 DIAGNOSIS — L97312 Non-pressure chronic ulcer of right ankle with fat layer exposed: Secondary | ICD-10-CM | POA: Diagnosis not present

## 2023-12-17 ENCOUNTER — Other Ambulatory Visit: Payer: Self-pay | Admitting: Nurse Practitioner

## 2023-12-20 ENCOUNTER — Ambulatory Visit: Attending: Cardiology | Admitting: Cardiology

## 2023-12-20 ENCOUNTER — Ambulatory Visit

## 2023-12-20 ENCOUNTER — Telehealth: Payer: Self-pay

## 2023-12-20 VITALS — BP 120/70 | HR 56 | Ht 71.5 in | Wt 283.0 lb

## 2023-12-20 DIAGNOSIS — I4821 Permanent atrial fibrillation: Secondary | ICD-10-CM

## 2023-12-20 DIAGNOSIS — E785 Hyperlipidemia, unspecified: Secondary | ICD-10-CM | POA: Diagnosis not present

## 2023-12-20 DIAGNOSIS — E66813 Obesity, class 3: Secondary | ICD-10-CM | POA: Diagnosis not present

## 2023-12-20 DIAGNOSIS — M3119 Other thrombotic microangiopathy: Secondary | ICD-10-CM | POA: Diagnosis not present

## 2023-12-20 DIAGNOSIS — Z7901 Long term (current) use of anticoagulants: Secondary | ICD-10-CM | POA: Diagnosis present

## 2023-12-20 DIAGNOSIS — I129 Hypertensive chronic kidney disease with stage 1 through stage 4 chronic kidney disease, or unspecified chronic kidney disease: Secondary | ICD-10-CM | POA: Diagnosis not present

## 2023-12-20 DIAGNOSIS — R600 Localized edema: Secondary | ICD-10-CM | POA: Diagnosis not present

## 2023-12-20 DIAGNOSIS — G4733 Obstructive sleep apnea (adult) (pediatric): Secondary | ICD-10-CM | POA: Diagnosis not present

## 2023-12-20 DIAGNOSIS — I872 Venous insufficiency (chronic) (peripheral): Secondary | ICD-10-CM | POA: Diagnosis not present

## 2023-12-20 DIAGNOSIS — R011 Cardiac murmur, unspecified: Secondary | ICD-10-CM | POA: Diagnosis not present

## 2023-12-20 DIAGNOSIS — N1831 Chronic kidney disease, stage 3a: Secondary | ICD-10-CM | POA: Diagnosis not present

## 2023-12-20 DIAGNOSIS — E11620 Type 2 diabetes mellitus with diabetic dermatitis: Secondary | ICD-10-CM | POA: Diagnosis not present

## 2023-12-20 LAB — POCT INR: INR: 1.9 — AB (ref 2.0–3.0)

## 2023-12-20 NOTE — Progress Notes (Unsigned)
 Cardiology Office Note:  .   Date:  12/26/2023  ID:  Brett [name], DOB 11/26/1945, MRN [id number] PCP: Georgina Speaks, FNP  [facility] Providers Cardiologist:  Alm Clay, MD     Chief Complaint  Patient presents with   Follow-up    Annual follow-up.  Doing well   Atrial Fibrillation    Really unaware of A-fib unless he overdoes it.    Patient Profile: .     Brett [name] is a morbidly obese 78 y.o. male former smoker with a PMH notable for Long-standing Afib (developed TTP in response to DOAC),HTN, HLD & OSA-CPAP who presents here for annual follow-up at the request of Georgina Speaks, FNP.  PMH:  Longstanding permanent A-fib,  Developed TTP in response to the Pradaxa treated with plasmapheresis-converted to warfarin)  OSA on CPAP  HTN, HLD, DM-2 CKD stage IIIa Chronic venous stasis dermatitis-on standing dose of furosemide       Brett [name] was last seen on 11/30/2022: Doing fairly well just noting that he was having some fatigue and shortness of breath with overexerting himself that he felt was related to A-fib but this was stable.  No chest pain pressure or significant dyspnea.  Stable mild end of day swelling.  Significant knee pain.  He really did not Velcro compression bands for swelling.  But able to do yard work: BP controlled on diltiazem  to 40, Lopressor  100 mg 3 times daily and hydralazine  50 mg twice daily chronic venous stasis  Most recent visit with nephrology was on 09/24/2023.  Doing well.  No complaints.  Good compliance with medications.  Stay hydrated.  Tolerating losartan.  No chest pain or dyspnea.  Blood pressure was 125/69.  Was started on Karendia 10 mg daily along with 10 mg Jardiance  and losartan  Subjective  Discussed the use of AI scribe software for clinical note transcription with the patient, who gave verbal consent to proceed.  History of Present Illness Brett [name] is a 78 year old male with chronic kidney disease  and atrial fibrillation who presents for follow-up of his cardiovascular and renal health.  He has a history of thrombotic thrombocytopenic purpura (TTP) which previously led to kidney disease. He is currently on Coumadin  for anticoagulation, having switched from Pradaxa due to concerns about adverse effects. No recent major health events have occurred since the Pradaxa interaction in 2014.  He is on multiple medications for heart rate control, including diltiazem  240 mg and metoprolol  100 mg three times a day. He also takes hydralazine  50 mg twice a day. He reports stable blood pressure readings and no recent episodes of atrial fibrillation, though he notes feeling tired and experiencing shortness of breath during outdoor activities. No chest tightness, pressure, or pain, and no shortness of breath when lying flat or waking up. No dizziness, syncope, or gastrointestinal bleeding.  For cholesterol management, he takes pravastatin 80 mg. Recent blood work from October shows a total cholesterol of 110, HDL of 28, LDL of 55, and triglycerides of 158. His A1c is 5.6, and he manages his diabetes with Jardiance  10 mg and Trulicity  1.5 mg once a week.  He has chronic kidney disease and has been told he is losing protein in his urine. He is on Kerendia  10 mg and furosemide  20 mg twice a day for kidney health and swelling management. He reports no need for additional furosemide  doses recently.  He uses a CPAP machine for sleep apnea, which he has been using  consistently with over 12,000 hours logged. He has not seen a sleep specialist in several years but reports the machine is effective.  He experiences stable leg swelling, managed with compression wraps and wound care for chronic venous insufficiency, which he has had since around 2010 or 2012. He uses Tylenol  as needed for back pain and occasionally at night.  He has lost weight, down from 291 to 283 pounds, which he attributes to a combination of medication  and lifestyle changes. He remains active, preferring cooler weather for outdoor activities.     Objective   Medications: Diltiazem  240 Miller daily, Toprol  tartrate 100 mg 3 times daily.  (I do not see losartan listed); hydralazine  50 mg twice daily Warfarin per protocol Karendia 10 mg daily, Jardiance  10 mg daily; furosemide  20 mg twice daily Metformin 750 mg daily; Trulicity  1.5 mg weekly Pravastatin 80 mg daily, fish oil Allegra  180 mg daily  Studies Reviewed: SABRA   EKG Interpretation Date/Time:  Friday December 20 2023 15:13:37 EST Ventricular Rate:  58 PR Interval:    QRS Duration:  80 QT Interval:  432 QTC Calculation: 424 R Axis:   -21  Text Interpretation: Atrial fibrillation with slow ventricular response Nonspecific ST abnormality When compared with ECG of 30-Nov-2022 14:16, Nonspecific T wave abnormality, improved in Lateral leads Confirmed by Anner Lenis ([id number]) on 12/20/2023 3:59:43 PM    Lab Results  Component Value Date   CHOL 110 10/14/2023   HDL 28 (L) 10/14/2023   LDLCALC 55 10/14/2023   TRIG 158 (H) 10/14/2023   CHOLHDL 3.9 10/14/2023   Lab Results  Component Value Date   NA 140 10/14/2023   CL 100 10/14/2023   K 4.9 10/14/2023   CO2 27 10/14/2023   BUN 23 10/14/2023   CREATININE 1.19 10/14/2023   EGFR 63 10/14/2023   CALCIUM  9.6 10/14/2023   PHOS 3.2 06/27/2012   ALBUMIN  4.3 05/23/2021   GLUCOSE 74 10/14/2023   Lab Results  Component Value Date   HGBA1C 5.6 10/14/2023      Latest Ref Rng & Units 05/22/2018    1:07 PM 11/21/2017   12:21 PM 07/07/2012    5:50 AM  CBC  WBC 3.4 - 10.8 x10E3/uL 7.3  7.9  5.9   Hemoglobin 13.0 - 17.7 g/dL 85.3  85.0  89.5   Hematocrit 37.5 - 51.0 % 44.4  42.0  32.0   Platelets 150 - 450 x10E3/uL 223  273  261     Results Labs Total Chol (10/14/2023): 110 HDL (10/14/2023): 28 LDL (89/93/7974): 55 Triglycerides (10/14/2023): 158 HbA1c (10/14/2023): 5.6 Creatinine (most recent): 1.38, increased from  1.12 on 09/2023  Prior Cardiac Studies: TTE 06/17/2012:: Normal LV size and function with EF 60 to 65%.  No RWMA.  No vegetations.  Septal bowing.  No PFO.  Normal valves. ABIs 11/18/2020: Noncompressible bilateral lower extremitY arteries with normal TBI's bilaterally.  Pedal waveforms are normal.   Risk Assessment/Calculations:    CHA2DS2-VASc Score =     This indicates a  % annual risk of stroke. The patient's score is based upon: CHF History: 0 HTN History: 1 Diabetes History: 1 Stroke History: 0 Vascular Disease History: 1 Age Score: 2 Gender Score: 0          Physical Exam:   VS:  BP 120/70 (BP Location: Right Arm, Patient Position: Sitting, Cuff Size: Large)   Pulse (!) 56   Ht 5' 11.5 (1.816 m)   Wt 283 lb (128.4 kg)  SpO2 94%   BMI 38.92 kg/m    Wt Readings from Last 3 Encounters:  12/20/23 283 lb (128.4 kg)  10/14/23 287 lb 6.4 oz (130.4 kg)  07/30/23 291 lb (132 kg)     GEN: Well nourished, well developed in no acute distress; moderately obese.  Weight loss noted. NECK: No JVD; No carotid bruits CARDIAC: Normal S1, S2; RRR, cannot exclude harsh systolic murmur but otherwise no rubs or gallops.  Murmurs, rubs, gallops RESPIRATORY:  Clear to auscultation without rales, wheezing or rhonchi ; nonlabored, good air movement. ABDOMEN: Soft, non-tender, non-distended EXTREMITIES: Bilateral lower extremity edema at least 2+ has support hose on.;  No notable deformity      ASSESSMENT AND PLAN: .    Problem List Items Addressed This Visit       Cardiology Problems   Benign hypertension with CKD (chronic kidney disease), stage II (Chronic)   He is on a combination of diltiazem  240 Miller daily, Toprol  tartrate 100 mg 3 times daily which we are converting to 150 mg twice daily along with hydralazine  50 twice daily.  Nephrology note indicates that he is also on losartan but I do not see that listed.  Needs to confirm with nephrology.      Chronic venous stasis  dermatitis of both lower extremities (Chronic)   Managed with furosemide  20 mg twice daily, Jardiance  daily daily and Kerendia  10 mL daily and compression therapy. No recent need for additional furosemide  doses. - Continue furosemide  20 mg twice a day and Jardiance . - Use compression wraps for ankle wounds.      History: TTP (thrombotic thrombocytopenic purpura) (Chronic)   This seems to have completely resolved.  No longer on DOAC.  Is on warfarin.      Permanent atrial fibrillation (HCC): CHA2DS2Vasc = 3; On Warfarin - Primary (Chronic)   Permanent atrial fibrillation => on combination of metoprolol  to tartrate 100 mg 3 times daily along with diltiazem  240 mg daily for rate control. With prior adverse reaction (TTP) to DOAC, has now been maintained on Coumadin .  Symptoms include fatigue and dyspnea during physical activity. Blood pressure is well-controlled. - Continue Coumadin  for anticoagulation. - Adjusted metoprolol  dosing to 1.5 tablets twice a day.  (Easier to take 1.5 tablets - 150 mg twice daily as opposed to 100 mg 3 times daily) - Continue diltiazem  240 mg daily  No bleeding issues.      Relevant Orders   EKG 12-Lead (Completed)     Other   Bilateral leg edema (Chronic)   Pretty well-controlled on low-dose Lasix  20 mg twice daily.  Continue foot elevation and with support stockings      CKD stage 3a, GFR 45-59 ml/min (HCC) (Chronic)   Chronic kidney disease stage 3a with proteinuria Managed with Jardiance  and Kerendia . Recent labs show stable kidney function with persistent proteinuria. - Continue Jardiance  and Kerendia  for kidney protection. - Need to confirm whether he has or is not on ARB with nephrologist.      Class 3 severe obesity due to excess calories with serious comorbidity and body mass index (BMI) of 40.0 to 44.9 in adult [facility]) (Chronic)   Has noticed some weight loss.  Make some adjustments to diet, increase exercise level and continue Trulicity .       Dyslipidemia (high LDL; low HDL) (Chronic)   Pravastatin prescribed.-LDL 55 on most recent check.  Well-controlled. - Continue pravastatin 40 mg daily for lipid management.      Heart murmur  Newly detected murmur, possibly due to aortic sclerosis or stenosis. Last echocardiogram in 2014 showed valve calcification. - Ordered echocardiogram to evaluate the cardiac murmur.      Long term current use of anticoagulant therapy (Chronic)   Now on warfarin for chosen OAC based on history of TTP with DOACs.  Okay to hold DOAC 2 to 3 days preop for surgeries or procedures.  Does not need bridging.      OSA (obstructive sleep apnea) (Chronic)   Continue CPAP make sure he has coverage      Type 2 diabetes mellitus with diabetic dermatitis, without long-term current use of insulin  (HCC) (Chronic)   Well-controlled with an A1c of 5.6%. Managed with Jardiance , metformin, and Trulicity . Weight loss noted. - Continue Jardiance  and Trulicity  along with metformin for glycemic control.             Follow-Up: Return in about 1 year (around 12/19/2024) for 1 Yr Follow-up, [employer].      Signed, Alm MICAEL Clay, MD, MS Alm Clay, M.D., M.S. Interventional Cardiologist  [facility] Pager # [phone]

## 2023-12-20 NOTE — Patient Instructions (Signed)
 Take 2 tablets today only then Continue taking warfarin 1.5 tablets daily except 1 tablet on Mondays.  Stay consistent with greens each week (Continue 1 per week)  Repeat INR in 8 weeks.  Coumadin  Clinic [phone]

## 2023-12-20 NOTE — Progress Notes (Signed)
 INR 1.9 Please see anticoagulation encounter Take 2 tablets today only then Continue taking warfarin 1.5 tablets daily except 1 tablet on Mondays.  Stay consistent with greens each week (Continue 1 per week)  Repeat INR in 8 weeks.  Coumadin  Clinic [phone]

## 2023-12-20 NOTE — Patient Instructions (Addendum)
 Annual follow-up medication Instructions:   No changes *If you need a refill on your cardiac medications before your next appointment, please call your pharmacy*   Lab Work: Not needed   Testing/Procedures:  Not needed  Follow-Up: At [facility], you and your health needs are our priority.  As part of our continuing mission to provide you with exceptional heart care, we have created designated Provider Care Teams.  These Care Teams include your primary Cardiologist (physician) and Advanced Practice Providers (APPs -  Physician Assistants and Nurse Practitioners) who all work together to provide you with the care you need, when you need it.     Your next appointment:   12 month(s)  The format for your next appointment:   In Person  Provider:   Alm Clay, MD

## 2023-12-20 NOTE — Telephone Encounter (Signed)
 Lpm to see if he could come to Coumadin  appt at 2:30 instead of following Dr Anner @ 3.

## 2023-12-23 ENCOUNTER — Encounter (HOSPITAL_BASED_OUTPATIENT_CLINIC_OR_DEPARTMENT_OTHER): Admitting: Internal Medicine

## 2023-12-23 DIAGNOSIS — L97312 Non-pressure chronic ulcer of right ankle with fat layer exposed: Secondary | ICD-10-CM | POA: Diagnosis not present

## 2023-12-23 DIAGNOSIS — I872 Venous insufficiency (chronic) (peripheral): Secondary | ICD-10-CM | POA: Diagnosis not present

## 2023-12-23 DIAGNOSIS — L97322 Non-pressure chronic ulcer of left ankle with fat layer exposed: Secondary | ICD-10-CM | POA: Diagnosis not present

## 2023-12-23 DIAGNOSIS — E11622 Type 2 diabetes mellitus with other skin ulcer: Secondary | ICD-10-CM | POA: Diagnosis not present

## 2023-12-26 ENCOUNTER — Encounter: Payer: Self-pay | Admitting: Cardiology

## 2023-12-26 DIAGNOSIS — N1831 Chronic kidney disease, stage 3a: Secondary | ICD-10-CM | POA: Insufficient documentation

## 2023-12-26 DIAGNOSIS — R011 Cardiac murmur, unspecified: Secondary | ICD-10-CM | POA: Insufficient documentation

## 2023-12-26 NOTE — Assessment & Plan Note (Signed)
 Has noticed some weight loss.  Make some adjustments to diet, increase exercise level and continue Trulicity .

## 2023-12-26 NOTE — Assessment & Plan Note (Signed)
 Pretty well-controlled on low-dose Lasix  20 mg twice daily.  Continue foot elevation and with support stockings

## 2023-12-26 NOTE — Assessment & Plan Note (Signed)
 This seems to have completely resolved.  No longer on DOAC.  Is on warfarin.

## 2023-12-26 NOTE — Assessment & Plan Note (Signed)
 Well-controlled with an A1c of 5.6%. Managed with Jardiance , metformin, and Trulicity . Weight loss noted. - Continue Jardiance  and Trulicity  along with metformin for glycemic control.

## 2023-12-26 NOTE — Assessment & Plan Note (Signed)
 Chronic kidney disease stage 3a with proteinuria Managed with Jardiance  and Kerendia . Recent labs show stable kidney function with persistent proteinuria. - Continue Jardiance  and Kerendia  for kidney protection. - Need to confirm whether he has or is not on ARB with nephrologist.

## 2023-12-26 NOTE — Assessment & Plan Note (Signed)
 Managed with furosemide  20 mg twice daily, Jardiance  daily daily and Kerendia  10 mL daily and compression therapy. No recent need for additional furosemide  doses. - Continue furosemide  20 mg twice a day and Jardiance . - Use compression wraps for ankle wounds.

## 2023-12-26 NOTE — Assessment & Plan Note (Signed)
 Permanent atrial fibrillation => on combination of metoprolol  to tartrate 100 mg 3 times daily along with diltiazem  240 mg daily for rate control. With prior adverse reaction (TTP) to DOAC, has now been maintained on Coumadin .  Symptoms include fatigue and dyspnea during physical activity. Blood pressure is well-controlled. - Continue Coumadin  for anticoagulation. - Adjusted metoprolol  dosing to 1.5 tablets twice a day.  (Easier to take 1.5 tablets - 150 mg twice daily as opposed to 100 mg 3 times daily) - Continue diltiazem  240 mg daily  No bleeding issues.

## 2023-12-26 NOTE — Assessment & Plan Note (Signed)
 Pravastatin prescribed.-LDL 55 on most recent check.  Well-controlled. - Continue pravastatin 40 mg daily for lipid management.

## 2023-12-26 NOTE — Assessment & Plan Note (Signed)
 Newly detected murmur, possibly due to aortic sclerosis or stenosis. Last echocardiogram in 2014 showed valve calcification. - Ordered echocardiogram to evaluate the cardiac murmur.

## 2023-12-26 NOTE — Assessment & Plan Note (Signed)
 Now on warfarin for chosen OAC based on history of TTP with DOACs.  Okay to hold DOAC 2 to 3 days preop for surgeries or procedures.  Does not need bridging.

## 2023-12-26 NOTE — Assessment & Plan Note (Signed)
 He is on a combination of diltiazem  240 Miller daily, Toprol  tartrate 100 mg 3 times daily which we are converting to 150 mg twice daily along with hydralazine  50 twice daily.  Nephrology note indicates that he is also on losartan but I do not see that listed.  Needs to confirm with nephrology.

## 2023-12-26 NOTE — Assessment & Plan Note (Signed)
 Continue CPAP make sure he has coverage

## 2024-01-01 ENCOUNTER — Encounter (HOSPITAL_BASED_OUTPATIENT_CLINIC_OR_DEPARTMENT_OTHER): Admitting: General Surgery

## 2024-01-01 DIAGNOSIS — L97312 Non-pressure chronic ulcer of right ankle with fat layer exposed: Secondary | ICD-10-CM | POA: Diagnosis not present

## 2024-01-01 DIAGNOSIS — I4821 Permanent atrial fibrillation: Secondary | ICD-10-CM

## 2024-01-07 ENCOUNTER — Encounter (HOSPITAL_BASED_OUTPATIENT_CLINIC_OR_DEPARTMENT_OTHER): Admitting: General Surgery

## 2024-01-07 DIAGNOSIS — L97312 Non-pressure chronic ulcer of right ankle with fat layer exposed: Secondary | ICD-10-CM | POA: Diagnosis not present

## 2024-01-13 ENCOUNTER — Other Ambulatory Visit: Payer: Self-pay | Admitting: Nurse Practitioner

## 2024-01-15 ENCOUNTER — Encounter (HOSPITAL_BASED_OUTPATIENT_CLINIC_OR_DEPARTMENT_OTHER): Attending: General Surgery | Admitting: General Surgery

## 2024-01-15 DIAGNOSIS — L97312 Non-pressure chronic ulcer of right ankle with fat layer exposed: Secondary | ICD-10-CM | POA: Diagnosis not present

## 2024-01-15 DIAGNOSIS — E11622 Type 2 diabetes mellitus with other skin ulcer: Secondary | ICD-10-CM | POA: Diagnosis present

## 2024-01-15 DIAGNOSIS — L97322 Non-pressure chronic ulcer of left ankle with fat layer exposed: Secondary | ICD-10-CM | POA: Insufficient documentation

## 2024-01-15 DIAGNOSIS — I890 Lymphedema, not elsewhere classified: Secondary | ICD-10-CM | POA: Diagnosis not present

## 2024-01-15 DIAGNOSIS — L97822 Non-pressure chronic ulcer of other part of left lower leg with fat layer exposed: Secondary | ICD-10-CM | POA: Insufficient documentation

## 2024-01-15 DIAGNOSIS — I872 Venous insufficiency (chronic) (peripheral): Secondary | ICD-10-CM | POA: Insufficient documentation

## 2024-01-16 ENCOUNTER — Other Ambulatory Visit: Payer: Self-pay | Admitting: Cardiology

## 2024-01-23 ENCOUNTER — Encounter (HOSPITAL_BASED_OUTPATIENT_CLINIC_OR_DEPARTMENT_OTHER): Admitting: General Surgery

## 2024-01-23 DIAGNOSIS — E11622 Type 2 diabetes mellitus with other skin ulcer: Secondary | ICD-10-CM | POA: Diagnosis not present

## 2024-01-29 ENCOUNTER — Encounter (HOSPITAL_BASED_OUTPATIENT_CLINIC_OR_DEPARTMENT_OTHER): Admitting: General Surgery

## 2024-01-29 DIAGNOSIS — E11622 Type 2 diabetes mellitus with other skin ulcer: Secondary | ICD-10-CM | POA: Diagnosis not present

## 2024-02-05 ENCOUNTER — Encounter (HOSPITAL_BASED_OUTPATIENT_CLINIC_OR_DEPARTMENT_OTHER): Admitting: General Surgery

## 2024-02-12 ENCOUNTER — Ambulatory Visit: Admitting: *Deleted

## 2024-02-12 ENCOUNTER — Encounter (HOSPITAL_BASED_OUTPATIENT_CLINIC_OR_DEPARTMENT_OTHER): Admitting: General Surgery

## 2024-02-12 DIAGNOSIS — I4821 Permanent atrial fibrillation: Secondary | ICD-10-CM

## 2024-02-12 DIAGNOSIS — Z7901 Long term (current) use of anticoagulants: Secondary | ICD-10-CM

## 2024-02-12 LAB — POCT INR: INR: 1.5 — AB (ref 2.0–3.0)

## 2024-02-12 NOTE — Progress Notes (Signed)
 Description   INR-1.5; Today take 2 tablets today and tomorrow then continue taking warfarin 1.5 tablets daily except 1 tablet on Mondays.  Stay consistent with greens each week (Continue 1 per week)  Repeat INR in 4 weeks (normally 8 weeks).  Coumadin  Clinic [phone]

## 2024-02-12 NOTE — Patient Instructions (Signed)
 Description   INR-1.5; Today take 2 tablets today and tomorrow then continue taking warfarin 1.5 tablets daily except 1 tablet on Mondays.  Stay consistent with greens each week (Continue 1 per week)  Repeat INR in 4 weeks (normally 8 weeks).  Coumadin  Clinic [phone]

## 2024-02-20 ENCOUNTER — Encounter (HOSPITAL_BASED_OUTPATIENT_CLINIC_OR_DEPARTMENT_OTHER): Admitting: General Surgery

## 2024-02-26 ENCOUNTER — Ambulatory Visit: Payer: Self-pay | Admitting: Nurse Practitioner

## 2024-02-26 ENCOUNTER — Ambulatory Visit: Payer: Medicare Other

## 2024-02-27 ENCOUNTER — Encounter (HOSPITAL_BASED_OUTPATIENT_CLINIC_OR_DEPARTMENT_OTHER): Admitting: General Surgery

## 2024-03-11 ENCOUNTER — Ambulatory Visit: Payer: Self-pay | Admitting: Nurse Practitioner

## 2024-03-11 ENCOUNTER — Ambulatory Visit

## 2024-03-11 ENCOUNTER — Ambulatory Visit: Payer: Medicare Other
